# Patient Record
Sex: Male | Born: 1945
Health system: Southern US, Community
[De-identification: ages and names within clinical notes are randomized; demographics above are authoritative.]

## PROBLEM LIST (undated history)

## (undated) DIAGNOSIS — J302 Other seasonal allergic rhinitis: Secondary | ICD-10-CM

## (undated) DIAGNOSIS — L409 Psoriasis, unspecified: Secondary | ICD-10-CM

## (undated) DIAGNOSIS — H8109 Meniere's disease, unspecified ear: Secondary | ICD-10-CM

## (undated) DIAGNOSIS — I10 Essential (primary) hypertension: Secondary | ICD-10-CM

## (undated) DIAGNOSIS — I7789 Other specified disorders of arteries and arterioles: Secondary | ICD-10-CM

## (undated) DIAGNOSIS — Z9889 Other specified postprocedural states: Secondary | ICD-10-CM

## (undated) DIAGNOSIS — N529 Male erectile dysfunction, unspecified: Secondary | ICD-10-CM

## (undated) DIAGNOSIS — H9190 Unspecified hearing loss, unspecified ear: Secondary | ICD-10-CM

## (undated) DIAGNOSIS — I471 Supraventricular tachycardia, unspecified: Secondary | ICD-10-CM

## (undated) DIAGNOSIS — Z9089 Acquired absence of other organs: Secondary | ICD-10-CM

## (undated) DIAGNOSIS — E1165 Type 2 diabetes mellitus with hyperglycemia: Secondary | ICD-10-CM

## (undated) DIAGNOSIS — T7840XA Allergy, unspecified, initial encounter: Secondary | ICD-10-CM

## (undated) DIAGNOSIS — L309 Dermatitis, unspecified: Secondary | ICD-10-CM

## (undated) DIAGNOSIS — Z8719 Personal history of other diseases of the digestive system: Secondary | ICD-10-CM

## (undated) DIAGNOSIS — H269 Unspecified cataract: Secondary | ICD-10-CM

## (undated) DIAGNOSIS — N200 Calculus of kidney: Secondary | ICD-10-CM

## (undated) DIAGNOSIS — Z9049 Acquired absence of other specified parts of digestive tract: Secondary | ICD-10-CM

## (undated) DIAGNOSIS — N400 Enlarged prostate without lower urinary tract symptoms: Secondary | ICD-10-CM

## (undated) HISTORY — PX: OTHER SURGICAL HISTORY: SHX169

## (undated) HISTORY — PX: CHOLECYSTECTOMY: SHX55

## (undated) HISTORY — PX: HERNIA REPAIR: SHX51

## (undated) HISTORY — DX: Calculus of kidney: N20.0

## (undated) HISTORY — PX: TONSILLECTOMY: SHX5217

## (undated) HISTORY — DX: Other seasonal allergic rhinitis: J30.2

## (undated) HISTORY — DX: Male erectile dysfunction, unspecified: N52.9

## (undated) HISTORY — DX: Acquired absence of other specified parts of digestive tract: Z90.49

## (undated) HISTORY — PX: COLONOSCOPY: SHX174

## (undated) HISTORY — DX: Essential (primary) hypertension: I10

## (undated) HISTORY — DX: Allergy, unspecified, initial encounter: T78.40XA

## (undated) HISTORY — DX: Dermatitis, unspecified: L30.9

## (undated) HISTORY — DX: Psoriasis, unspecified: L40.9

## (undated) HISTORY — PX: EYE SURGERY: SHX253

## (undated) HISTORY — DX: Acquired absence of other organs: Z90.89

## (undated) HISTORY — DX: Personal history of other diseases of the digestive system: Z87.19

## (undated) HISTORY — PX: APPENDECTOMY: SHX54

## (undated) HISTORY — DX: Supraventricular tachycardia, unspecified: I47.10

## (undated) HISTORY — DX: Unspecified hearing loss, unspecified ear: H91.90

## (undated) HISTORY — DX: Type 2 diabetes mellitus with hyperglycemia: E11.65

## (undated) HISTORY — DX: Unspecified cataract: H26.9

## (undated) HISTORY — DX: Meniere's disease, unspecified ear: H81.09

## (undated) HISTORY — DX: Other specified postprocedural states: Z98.890

## (undated) HISTORY — DX: Supraventricular tachycardia: I47.1

## (undated) HISTORY — DX: Other specified disorders of arteries and arterioles: I77.89

---

## 2005-06-25 ENCOUNTER — Emergency Department (HOSPITAL_COMMUNITY): Admission: EM | Admit: 2005-06-25 | Discharge: 2005-06-25 | Payer: Self-pay | Admitting: Emergency Medicine

## 2006-10-08 ENCOUNTER — Ambulatory Visit: Payer: Self-pay | Admitting: Family Medicine

## 2006-10-09 LAB — CONVERTED CEMR LAB
ALT: 33 units/L (ref 0–40)
AST: 79 units/L — ABNORMAL HIGH (ref 0–37)
Albumin: 4.3 g/dL (ref 3.5–5.2)
Alkaline Phosphatase: 52 units/L (ref 39–117)
BUN: 23 mg/dL (ref 6–23)
Basophils Absolute: 0.1 10*3/uL (ref 0.0–0.1)
Basophils Relative: 1.3 % — ABNORMAL HIGH (ref 0.0–1.0)
Bilirubin, Direct: 0.1 mg/dL (ref 0.0–0.3)
CO2: 26 meq/L (ref 19–32)
Calcium: 9 mg/dL (ref 8.4–10.5)
Chloride: 103 meq/L (ref 96–112)
Cholesterol: 172 mg/dL (ref 0–200)
Creatinine, Ser: 1.2 mg/dL (ref 0.4–1.5)
Eosinophils Absolute: 0.3 10*3/uL (ref 0.0–0.6)
Eosinophils Relative: 3.8 % (ref 0.0–5.0)
Free T4: 0.8 ng/dL (ref 0.6–1.6)
GFR calc Af Amer: 79 mL/min
GFR calc non Af Amer: 66 mL/min
Glucose, Bld: 77 mg/dL (ref 70–99)
HCT: 44.5 % (ref 39.0–52.0)
HDL: 34.1 mg/dL — ABNORMAL LOW (ref >39.0)
Hemoglobin: 15.7 g/dL (ref 13.0–17.0)
LDL Cholesterol: 111 mg/dL — ABNORMAL HIGH (ref 0–99)
Lymphocytes Relative: 22.1 % (ref 12.0–46.0)
MCHC: 35.4 g/dL (ref 30.0–36.0)
MCV: 92.5 fL (ref 78.0–100.0)
Monocytes Absolute: 1 10*3/uL — ABNORMAL HIGH (ref 0.2–0.7)
Monocytes Relative: 12.8 % — ABNORMAL HIGH (ref 3.0–11.0)
Neutro Abs: 4.4 10*3/uL (ref 1.4–7.7)
Neutrophils Relative %: 60 % (ref 43.0–77.0)
PSA: 1.83 ng/mL (ref 0.10–4.00)
Platelets: 240 10*3/uL (ref 150–400)
Potassium: 4 meq/L (ref 3.5–5.1)
RBC: 4.81 M/uL (ref 4.22–5.81)
RDW: 11.9 % (ref 11.5–14.6)
Sodium: 135 meq/L (ref 135–145)
T3, Free: 3 pg/mL (ref 2.3–4.2)
TSH: 1.5 microintl units/mL (ref 0.35–5.50)
Total Bilirubin: 1.4 mg/dL — ABNORMAL HIGH (ref 0.3–1.2)
Total CHOL/HDL Ratio: 5
Total Protein: 6.9 g/dL (ref 6.0–8.3)
Triglycerides: 135 mg/dL (ref 0–149)
VLDL: 27 mg/dL (ref 0–40)
WBC: 7.5 10*3/uL (ref 4.5–10.5)

## 2006-12-28 ENCOUNTER — Ambulatory Visit: Payer: Self-pay | Admitting: Family Medicine

## 2007-12-04 ENCOUNTER — Ambulatory Visit: Payer: Self-pay | Admitting: Family Medicine

## 2007-12-04 LAB — CONVERTED CEMR LAB
ALT: 30 units/L (ref 0–53)
AST: 60 units/L — ABNORMAL HIGH (ref 0–37)
Albumin: 4.2 g/dL (ref 3.5–5.2)
Alkaline Phosphatase: 60 units/L (ref 39–117)
BUN: 21 mg/dL (ref 6–23)
Basophils Absolute: 0 10*3/uL (ref 0.0–0.1)
Basophils Relative: 0.4 % (ref 0.0–1.0)
Bilirubin Urine: NEGATIVE
Bilirubin, Direct: 0.1 mg/dL (ref 0.0–0.3)
Blood in Urine, dipstick: NEGATIVE
CO2: 30 meq/L (ref 19–32)
Calcium: 9.6 mg/dL (ref 8.4–10.5)
Chloride: 110 meq/L (ref 96–112)
Cholesterol: 172 mg/dL (ref 0–200)
Creatinine, Ser: 1 mg/dL (ref 0.4–1.5)
Eosinophils Absolute: 0.1 10*3/uL (ref 0.0–0.7)
Eosinophils Relative: 2.5 % (ref 0.0–5.0)
GFR calc Af Amer: 98 mL/min
GFR calc non Af Amer: 81 mL/min
Glucose, Bld: 126 mg/dL — ABNORMAL HIGH (ref 70–99)
Glucose, Urine, Semiquant: NEGATIVE
HCT: 43.8 % (ref 39.0–52.0)
HDL: 31.6 mg/dL — ABNORMAL LOW (ref >39.0)
Hemoglobin: 15.4 g/dL (ref 13.0–17.0)
Ketones, urine, test strip: NEGATIVE
LDL Cholesterol: 123 mg/dL — ABNORMAL HIGH (ref 0–99)
Lymphocytes Relative: 23.1 % (ref 12.0–46.0)
MCHC: 35.1 g/dL (ref 30.0–36.0)
MCV: 94.5 fL (ref 78.0–100.0)
Monocytes Absolute: 0.6 10*3/uL (ref 0.1–1.0)
Monocytes Relative: 9.7 % (ref 3.0–12.0)
Neutro Abs: 3.7 10*3/uL (ref 1.4–7.7)
Neutrophils Relative %: 64.3 % (ref 43.0–77.0)
Nitrite: NEGATIVE
PSA: 2.22 ng/mL (ref 0.10–4.00)
Platelets: 201 10*3/uL (ref 150–400)
Potassium: 4.9 meq/L (ref 3.5–5.1)
Protein, U semiquant: NEGATIVE
RBC: 4.64 M/uL (ref 4.22–5.81)
RDW: 12.2 % (ref 11.5–14.6)
Sodium: 142 meq/L (ref 135–145)
Specific Gravity, Urine: 1.02
TSH: 0.86 microintl units/mL (ref 0.35–5.50)
Total Bilirubin: 1.1 mg/dL (ref 0.3–1.2)
Total CHOL/HDL Ratio: 5.4
Total Protein: 6.8 g/dL (ref 6.0–8.3)
Triglycerides: 88 mg/dL (ref 0–149)
Urobilinogen, UA: 0.2
VLDL: 18 mg/dL (ref 0–40)
WBC Urine, dipstick: NEGATIVE
WBC: 5.7 10*3/uL (ref 4.5–10.5)
pH: 5.5

## 2007-12-13 ENCOUNTER — Telehealth: Payer: Self-pay | Admitting: *Deleted

## 2007-12-13 ENCOUNTER — Ambulatory Visit: Payer: Self-pay | Admitting: Family Medicine

## 2007-12-13 DIAGNOSIS — H906 Mixed conductive and sensorineural hearing loss, bilateral: Secondary | ICD-10-CM

## 2007-12-13 DIAGNOSIS — E663 Overweight: Secondary | ICD-10-CM

## 2007-12-13 DIAGNOSIS — L258 Unspecified contact dermatitis due to other agents: Secondary | ICD-10-CM

## 2007-12-13 DIAGNOSIS — L259 Unspecified contact dermatitis, unspecified cause: Secondary | ICD-10-CM | POA: Insufficient documentation

## 2007-12-13 DIAGNOSIS — L408 Other psoriasis: Secondary | ICD-10-CM

## 2007-12-13 DIAGNOSIS — I10 Essential (primary) hypertension: Secondary | ICD-10-CM | POA: Insufficient documentation

## 2007-12-13 HISTORY — DX: Unspecified contact dermatitis, unspecified cause: L25.9

## 2007-12-13 HISTORY — DX: Other psoriasis: L40.8

## 2008-03-06 ENCOUNTER — Ambulatory Visit: Payer: Self-pay | Admitting: Family Medicine

## 2008-03-06 LAB — CONVERTED CEMR LAB
BUN: 16 mg/dL (ref 6–23)
CO2: 26 meq/L (ref 19–32)
Calcium: 8.8 mg/dL (ref 8.4–10.5)
Chloride: 106 meq/L (ref 96–112)
Cholesterol: 166 mg/dL (ref 0–200)
Creatinine, Ser: 1 mg/dL (ref 0.4–1.5)
GFR calc Af Amer: 97 mL/min
GFR calc non Af Amer: 80 mL/min
Glucose, Bld: 108 mg/dL — ABNORMAL HIGH (ref 70–99)
HDL: 31.2 mg/dL — ABNORMAL LOW (ref >39.0)
Hgb A1c MFr Bld: 5.7 % (ref 4.6–6.0)
LDL Cholesterol: 117 mg/dL — ABNORMAL HIGH (ref 0–99)
Potassium: 4.1 meq/L (ref 3.5–5.1)
Sodium: 137 meq/L (ref 135–145)
Total CHOL/HDL Ratio: 5.3
Triglycerides: 87 mg/dL (ref 0–149)
VLDL: 17 mg/dL (ref 0–40)

## 2008-03-11 ENCOUNTER — Ambulatory Visit: Payer: Self-pay | Admitting: Family Medicine

## 2008-03-30 DIAGNOSIS — M545 Low back pain, unspecified: Secondary | ICD-10-CM | POA: Insufficient documentation

## 2008-03-31 ENCOUNTER — Ambulatory Visit: Payer: Self-pay | Admitting: Family Medicine

## 2008-12-08 ENCOUNTER — Ambulatory Visit: Payer: Self-pay | Admitting: Family Medicine

## 2008-12-08 LAB — CONVERTED CEMR LAB
ALT: 23 units/L (ref 0–53)
AST: 55 units/L — ABNORMAL HIGH (ref 0–37)
Albumin: 4.1 g/dL (ref 3.5–5.2)
Alkaline Phosphatase: 59 units/L (ref 39–117)
BUN: 18 mg/dL (ref 6–23)
Basophils Absolute: 0 10*3/uL (ref 0.0–0.1)
Basophils Relative: 0.5 % (ref 0.0–3.0)
Bilirubin Urine: NEGATIVE
Bilirubin, Direct: 0.1 mg/dL (ref 0.0–0.3)
Blood in Urine, dipstick: NEGATIVE
CO2: 29 meq/L (ref 19–32)
Calcium: 9.2 mg/dL (ref 8.4–10.5)
Chloride: 107 meq/L (ref 96–112)
Cholesterol: 174 mg/dL (ref 0–200)
Creatinine, Ser: 1 mg/dL (ref 0.4–1.5)
Eosinophils Absolute: 0.3 10*3/uL (ref 0.0–0.7)
Eosinophils Relative: 4.8 % (ref 0.0–5.0)
GFR calc non Af Amer: 80.22 mL/min (ref >60)
Glucose, Bld: 117 mg/dL — ABNORMAL HIGH (ref 70–99)
Glucose, Urine, Semiquant: NEGATIVE
HCT: 45 % (ref 39.0–52.0)
HDL: 33 mg/dL — ABNORMAL LOW (ref >39.00)
Hemoglobin: 15.8 g/dL (ref 13.0–17.0)
Ketones, urine, test strip: NEGATIVE
LDL Cholesterol: 119 mg/dL — ABNORMAL HIGH (ref 0–99)
Lymphocytes Relative: 22.6 % (ref 12.0–46.0)
Lymphs Abs: 1.3 10*3/uL (ref 0.7–4.0)
MCHC: 35.2 g/dL (ref 30.0–36.0)
MCV: 95.1 fL (ref 78.0–100.0)
Monocytes Absolute: 0.5 10*3/uL (ref 0.1–1.0)
Monocytes Relative: 9.1 % (ref 3.0–12.0)
Neutro Abs: 3.8 10*3/uL (ref 1.4–7.7)
Neutrophils Relative %: 63 % (ref 43.0–77.0)
Nitrite: NEGATIVE
PSA: 2.17 ng/mL (ref 0.10–4.00)
Platelets: 223 10*3/uL (ref 150.0–400.0)
Potassium: 4.6 meq/L (ref 3.5–5.1)
Protein, U semiquant: NEGATIVE
RBC: 4.73 M/uL (ref 4.22–5.81)
RDW: 12.2 % (ref 11.5–14.6)
Sodium: 139 meq/L (ref 135–145)
Specific Gravity, Urine: 1.015
TSH: 0.93 microintl units/mL (ref 0.35–5.50)
Total Bilirubin: 1 mg/dL (ref 0.3–1.2)
Total CHOL/HDL Ratio: 5
Total Protein: 6.7 g/dL (ref 6.0–8.3)
Triglycerides: 112 mg/dL (ref 0.0–149.0)
Urobilinogen, UA: 0.2
VLDL: 22.4 mg/dL (ref 0.0–40.0)
WBC Urine, dipstick: NEGATIVE
WBC: 5.9 10*3/uL (ref 4.5–10.5)
pH: 5.5

## 2008-12-25 ENCOUNTER — Ambulatory Visit: Payer: Self-pay | Admitting: Family Medicine

## 2008-12-28 LAB — CONVERTED CEMR LAB: Hgb A1c MFr Bld: 5.3 % (ref 4.6–6.1)

## 2009-01-29 ENCOUNTER — Ambulatory Visit: Payer: Self-pay | Admitting: Family Medicine

## 2009-01-29 DIAGNOSIS — S61409A Unspecified open wound of unspecified hand, initial encounter: Secondary | ICD-10-CM | POA: Insufficient documentation

## 2009-02-08 ENCOUNTER — Ambulatory Visit: Payer: Self-pay | Admitting: Family Medicine

## 2009-12-24 ENCOUNTER — Ambulatory Visit: Payer: Self-pay | Admitting: Family Medicine

## 2009-12-24 LAB — CONVERTED CEMR LAB
ALT: 28 units/L (ref 0–53)
AST: 58 units/L — ABNORMAL HIGH (ref 0–37)
Albumin: 4.1 g/dL (ref 3.5–5.2)
Alkaline Phosphatase: 61 units/L (ref 39–117)
BUN: 19 mg/dL (ref 6–23)
Basophils Absolute: 0 10*3/uL (ref 0.0–0.1)
Basophils Relative: 0.7 % (ref 0.0–3.0)
Bilirubin Urine: NEGATIVE
Bilirubin, Direct: 0.2 mg/dL (ref 0.0–0.3)
CO2: 30 meq/L (ref 19–32)
Calcium: 9 mg/dL (ref 8.4–10.5)
Chloride: 104 meq/L (ref 96–112)
Cholesterol: 162 mg/dL (ref 0–200)
Creatinine, Ser: 0.9 mg/dL (ref 0.4–1.5)
Eosinophils Absolute: 0.4 10*3/uL (ref 0.0–0.7)
Eosinophils Relative: 8.9 % — ABNORMAL HIGH (ref 0.0–5.0)
GFR calc non Af Amer: 86.93 mL/min (ref >60)
Glucose, Bld: 119 mg/dL — ABNORMAL HIGH (ref 70–99)
HCT: 42.2 % (ref 39.0–52.0)
HDL: 32.5 mg/dL — ABNORMAL LOW (ref >39.00)
Hemoglobin, Urine: NEGATIVE
Hemoglobin: 14.9 g/dL (ref 13.0–17.0)
Ketones, ur: NEGATIVE mg/dL
LDL Cholesterol: 118 mg/dL — ABNORMAL HIGH (ref 0–99)
Leukocytes, UA: NEGATIVE
Lymphocytes Relative: 27.1 % (ref 12.0–46.0)
Lymphs Abs: 1.2 10*3/uL (ref 0.7–4.0)
MCHC: 35.2 g/dL (ref 30.0–36.0)
MCV: 93.4 fL (ref 78.0–100.0)
Monocytes Absolute: 0.4 10*3/uL (ref 0.1–1.0)
Monocytes Relative: 9.9 % (ref 3.0–12.0)
Neutro Abs: 2.3 10*3/uL (ref 1.4–7.7)
Neutrophils Relative %: 53.4 % (ref 43.0–77.0)
Nitrite: NEGATIVE
PSA: 1.84 ng/mL (ref 0.10–4.00)
Platelets: 204 10*3/uL (ref 150.0–400.0)
Potassium: 4.7 meq/L (ref 3.5–5.1)
RBC: 4.52 M/uL (ref 4.22–5.81)
RDW: 12.8 % (ref 11.5–14.6)
Sodium: 140 meq/L (ref 135–145)
Specific Gravity, Urine: >=1.03 (ref 1.000–1.030)
TSH: 0.76 microintl units/mL (ref 0.35–5.50)
Total Bilirubin: 0.9 mg/dL (ref 0.3–1.2)
Total CHOL/HDL Ratio: 5
Total Protein, Urine: NEGATIVE mg/dL
Total Protein: 6.5 g/dL (ref 6.0–8.3)
Triglycerides: 57 mg/dL (ref 0.0–149.0)
Urine Glucose: NEGATIVE mg/dL
Urobilinogen, UA: 0.2 (ref 0.0–1.0)
VLDL: 11.4 mg/dL (ref 0.0–40.0)
WBC: 4.4 10*3/uL — ABNORMAL LOW (ref 4.5–10.5)
pH: 6 (ref 5.0–8.0)

## 2009-12-31 ENCOUNTER — Ambulatory Visit: Payer: Self-pay | Admitting: Family Medicine

## 2010-01-28 ENCOUNTER — Ambulatory Visit: Payer: Self-pay | Admitting: Family Medicine

## 2010-02-01 ENCOUNTER — Telehealth: Payer: Self-pay | Admitting: Family Medicine

## 2010-02-01 DIAGNOSIS — L570 Actinic keratosis: Secondary | ICD-10-CM

## 2010-08-01 ENCOUNTER — Ambulatory Visit
Admission: RE | Admit: 2010-08-01 | Discharge: 2010-08-01 | Payer: Self-pay | Source: Home / Self Care | Attending: Family Medicine | Admitting: Family Medicine

## 2010-08-01 DIAGNOSIS — N410 Acute prostatitis: Secondary | ICD-10-CM | POA: Insufficient documentation

## 2010-08-01 DIAGNOSIS — J309 Allergic rhinitis, unspecified: Secondary | ICD-10-CM

## 2010-08-01 DIAGNOSIS — M549 Dorsalgia, unspecified: Secondary | ICD-10-CM | POA: Insufficient documentation

## 2010-08-01 HISTORY — DX: Allergic rhinitis, unspecified: J30.9

## 2010-08-01 LAB — CONVERTED CEMR LAB
Bilirubin Urine: NEGATIVE
Blood in Urine, dipstick: NEGATIVE
Glucose, Urine, Semiquant: NEGATIVE
Ketones, urine, test strip: NEGATIVE
Nitrite: NEGATIVE
Protein, U semiquant: NEGATIVE
Specific Gravity, Urine: 1.015
Urobilinogen, UA: 0.2
WBC Urine, dipstick: NEGATIVE
pH: 5

## 2010-08-02 ENCOUNTER — Encounter (INDEPENDENT_AMBULATORY_CARE_PROVIDER_SITE_OTHER): Payer: Self-pay | Admitting: *Deleted

## 2010-08-02 ENCOUNTER — Telehealth: Payer: Self-pay | Admitting: Family Medicine

## 2010-08-02 ENCOUNTER — Emergency Department (HOSPITAL_COMMUNITY)
Admission: EM | Admit: 2010-08-02 | Discharge: 2010-08-02 | Payer: Self-pay | Source: Home / Self Care | Admitting: Emergency Medicine

## 2010-08-03 ENCOUNTER — Encounter (INDEPENDENT_AMBULATORY_CARE_PROVIDER_SITE_OTHER): Payer: Self-pay | Admitting: *Deleted

## 2010-08-04 ENCOUNTER — Ambulatory Visit
Admission: RE | Admit: 2010-08-04 | Discharge: 2010-08-04 | Payer: Self-pay | Source: Home / Self Care | Attending: Family Medicine | Admitting: Family Medicine

## 2010-08-04 DIAGNOSIS — R1310 Dysphagia, unspecified: Secondary | ICD-10-CM

## 2010-08-04 HISTORY — DX: Dysphagia, unspecified: R13.10

## 2010-08-05 ENCOUNTER — Encounter
Admission: RE | Admit: 2010-08-05 | Discharge: 2010-08-05 | Payer: Self-pay | Source: Home / Self Care | Attending: Family Medicine | Admitting: Family Medicine

## 2010-08-08 LAB — BASIC METABOLIC PANEL
BUN: 13 mg/dL (ref 6–23)
CO2: 25 mEq/L (ref 19–32)
Calcium: 8.9 mg/dL (ref 8.4–10.5)
Chloride: 99 mEq/L (ref 96–112)
Creatinine, Ser: 1.09 mg/dL (ref 0.4–1.5)
GFR calc Af Amer: >60 mL/min (ref >60)
GFR calc non Af Amer: >60 mL/min (ref >60)
Glucose, Bld: 113 mg/dL — ABNORMAL HIGH (ref 70–99)
Potassium: 3.8 mEq/L (ref 3.5–5.1)
Sodium: 133 mEq/L — ABNORMAL LOW (ref 135–145)

## 2010-08-08 LAB — DIFFERENTIAL
Basophils Absolute: 0 10*3/uL (ref 0.0–0.1)
Basophils Relative: 0 % (ref 0–1)
Eosinophils Absolute: 0.2 10*3/uL (ref 0.0–0.7)
Eosinophils Relative: 4 % (ref 0–5)
Lymphocytes Relative: 18 % (ref 12–46)
Lymphs Abs: 1 10*3/uL (ref 0.7–4.0)
Monocytes Absolute: 0.8 10*3/uL (ref 0.1–1.0)
Monocytes Relative: 13 % — ABNORMAL HIGH (ref 3–12)
Neutro Abs: 3.7 10*3/uL (ref 1.7–7.7)
Neutrophils Relative %: 65 % (ref 43–77)

## 2010-08-08 LAB — POCT CARDIAC MARKERS
CKMB, poc: 1.5 ng/mL (ref 1.0–8.0)
CKMB, poc: <1 ng/mL — ABNORMAL LOW (ref 1.0–8.0)
Myoglobin, poc: 85.3 ng/mL (ref 12–200)
Myoglobin, poc: 63.6 ng/mL (ref 12–200)
Troponin i, poc: <0.05 ng/mL (ref 0.00–0.09)
Troponin i, poc: <0.05 ng/mL (ref 0.00–0.09)

## 2010-08-08 LAB — CBC
HCT: 38.9 % — ABNORMAL LOW (ref 39.0–52.0)
Hemoglobin: 14.4 g/dL (ref 13.0–17.0)
MCH: 32.7 pg (ref 26.0–34.0)
MCHC: 37 g/dL — ABNORMAL HIGH (ref 30.0–36.0)
MCV: 88.4 fL (ref 78.0–100.0)
Platelets: 147 10*3/uL — ABNORMAL LOW (ref 150–400)
RBC: 4.4 MIL/uL (ref 4.22–5.81)
RDW: 12.1 % (ref 11.5–15.5)
WBC: 5.7 10*3/uL (ref 4.0–10.5)

## 2010-08-08 LAB — D-DIMER, QUANTITATIVE: D-Dimer, Quant: 0.54 ug/mL-FEU — ABNORMAL HIGH (ref 0.00–0.48)

## 2010-08-23 NOTE — Progress Notes (Signed)
  Phone Note Outgoing Call   Summary of Call: I called Daniel Guerrero to review his path report, most likely an irritated A. K. see me in 8 weeks for re-examination.  Also advised if any new redness, etc., to call immediately for a wide excision Initial call taken by: Roderick Pee MD,  February 01, 2010 8:35 AM

## 2010-08-23 NOTE — Assessment & Plan Note (Signed)
Summary: lesion removal/njr   Procedure Note Last Tetanus: Tdap (12/13/2007)  Mole Biopsy/Removal: Indication: changing lesion Consent signed: yes  Procedure # 1: elliptical incision with 2 mm margin    Size (in cm): 1.0 x 1.0    Region: dorsal    Location: arm-upper-left    Instrument used: #15 blade    Anesthesia: 1% lidocaine w/epinephrine    Closure: caut  Cleaned and prepped with: alcohol Wound dressing: neosporin and bandaid   History of Present Illness: Daniel Guerrero is a 65 year old male, who comes in today for removal of a lesion on his left forearm.  He has had a lesion there for many years, recently, it's grown and changed colors  Allergies: 1)  ! Penicillin 2)  ! * Peanuts   Complete Medication List: 1)  Triamcinolone Acetonide 0.1 % Oint (Triamcinolone acetonide) .... Apply thin layer twice a day 2)  Clobetasol Propionate 0.05 % Oint (Clobetasol propionate) .... Apply once daily 3)  Lisinopril-hydrochlorothiazide 20-12.5 Mg Tabs (Lisinopril-hydrochlorothiazide) .... Once daily 4)  Neomycin-polymyxin B Gu 40-200000 Soln (Neomycin-polymyxin b gu) .... Apply to ears only 5)  Viagra 100 Mg Tabs (Sildenafil citrate) .... Uad 6)  Flexeril 10 Mg Tabs (Cyclobenzaprine hcl) .... Take 1 tablet by mouth three times a day 7)  Desonide 0.05 % Crea (Desonide) .... Twice a month for skin rash  Other Orders: Shave Skin Lesion 1.1-2.0 cm/trunk/arm/leg ([address])

## 2010-08-23 NOTE — Assessment & Plan Note (Signed)
Summary: CPX/CJR   Vital Signs:  Patient profile:   65 year old male Height:      69.75 inches Weight:      226 pounds BMI:     32.78 Temp:     97.9 degrees F oral BP sitting:   130 / 60  (left arm)  Vitals Entered By: Kathrynn Speed CMA (December 31, 2009 2:42 PM) CC: CPX w labs   CC:  CPX w labs.  History of Present Illness: Daniel Guerrero is a 65 year old, married male, nonsmoker, who comes in today for evaluation of hypertension eczema erectile dysfunction.  Obesity.  His hypertension is treated with lisinopril -- H. CTZ 2012.5 daily.  BP 130/60.  His eczema is treated with a combination of triamcinolone ointment and clobetasol ointment.  It typically use one or the other not both.  Erectile dysfunction.  He uses  V  p.r.n.  History GI care.  Dental care.  Colonoscopy normal in GI.  Tetanus 2009 seasonal flu 2010 Pneumovax 2009.  He continues to struggle with his weight currently to 226  Allergies: 1)  ! Penicillin 2)  ! * Peanuts  Past History:  Past medical, surgical, family and social histories (including risk factors) reviewed, and no changes noted (except as noted below).  Past Medical History: Reviewed history from 12/13/2007 and no changes required. Diabetes mellitus, type II Hypertension eczema psoriasis appendectomy right left hernia repair tonsillectomy bilateral cataracts high-frequency hearing loss erectile dysfunction overweight  Family History: Reviewed history from 12/13/2007 and no changes required. father died in his 65s, had a triple a smoker, diabetic mother died at 37 of lung cancer , also, history of thyroid diseaseone brother one sister in good health  Social History: Reviewed history from 12/13/2007 and no changes required. Occupation: Married Never Smoked Alcohol use-no Drug use-no Regular exercise-no  Review of Systems      See HPI  Physical Exam  General:  Well-developed,well-nourished,in no acute distress; alert,appropriate and  cooperative throughout examination Head:  Normocephalic and atraumatic without obvious abnormalities. No apparent alopecia or balding. Eyes:  No corneal or conjunctival inflammation noted. EOMI. Perrla. Funduscopic exam benign, without hemorrhages, exudates or papilledema. Vision grossly normal. Ears:  External ear exam shows no significant lesions or deformities.  Otoscopic examination reveals clear canals, tympanic membranes are intact bilaterally without bulging, retraction, inflammation or discharge. Hearing is grossly normal bilaterally. Nose:  External nasal examination shows no deformity or inflammation. Nasal mucosa are pink and moist without lesions or exudates. Mouth:  Oral mucosa and oropharynx without lesions or exudates.  Teeth in good repair. Neck:  No deformities, masses, or tenderness noted. Chest Wall:  No deformities, masses, tenderness or gynecomastia noted. Breasts:  No masses or gynecomastia noted Lungs:  Normal respiratory effort, chest expands symmetrically. Lungs are clear to auscultation, no crackles or wheezes. Heart:  Normal rate and regular rhythm. S1 and S2 normal without gallop, murmur, click, rub or other extra sounds. Abdomen:  Bowel sounds positive,abdomen soft and non-tender without masses, organomegaly or hernias noted. Rectal:  No external abnormalities noted. Normal sphincter tone. No rectal masses or tenderness. Genitalia:  Testes bilaterally descended without nodularity, tenderness or masses. No scrotal masses or lesions. No penis lesions or urethral discharge. Prostate:  Prostate gland firm and smooth, no enlargement, nodularity, tenderness, mass, asymmetry or induration. Msk:  No deformity or scoliosis noted of thoracic or lumbar spine.   Pulses:  R and L carotid,radial,femoral,dorsalis pedis and posterior tibial pulses are full and equal bilaterally Extremities:  No clubbing, cyanosis, edema, or deformity noted with normal full range of motion of all joints.    Neurologic:  No cranial nerve deficits noted. Station and gait are normal. Plantar reflexes are down-going bilaterally. DTRs are symmetrical throughout. Sensory, motor and coordinative functions appear intact. Skin:  Intact without suspicious lesions or rashes Cervical Nodes:  No lymphadenopathy noted Axillary Nodes:  No palpable lymphadenopathy Inguinal Nodes:  No significant adenopathy Psych:  Cognition and judgment appear intact. Alert and cooperative with normal attention span and concentration. No apparent delusions, illusions, hallucinations   Impression & Recommendations:  Problem # 1:  Preventive Health Care (ICD-V70.0) Assessment Unchanged  Problem # 2:  CONTACT DERMATITIS&OTH ECZEMA DUE OTH SPEC AGENT (ICD-692.89) Assessment: Improved  His updated medication list for this problem includes:    Triamcinolone Acetonide 0.1 % Oint (Triamcinolone acetonide) .Marland Kitchen... Apply thin layer twice a day    Clobetasol Propionate 0.05 % Oint (Clobetasol propionate) .Marland Kitchen... Apply once daily    Desonide 0.05 % Crea (Desonide) .Marland Kitchen... Twice a month for skin rash  Orders: Prescription Created Electronically [phone])  Problem # 3:  OVERWEIGHT (ICD-278.02) Assessment: Unchanged  Orders: Prescription Created Electronically [phone])  Problem # 4:  HYPERTENSION (ICD-401.9) Assessment: Improved  His updated medication list for this problem includes:    Lisinopril-hydrochlorothiazide 20-12.5 Mg Tabs (Lisinopril-hydrochlorothiazide) ..... Once daily  Orders: Prescription Created Electronically [phone]) EKG w/ Interpretation ([address])  Problem # 5:  DIABETES MELLITUS, TYPE II (ICD-250.00) Assessment: Unchanged  His updated medication list for this problem includes:    Lisinopril-hydrochlorothiazide 20-12.5 Mg Tabs (Lisinopril-hydrochlorothiazide) ..... Once daily  Orders: Prescription Created Electronically [phone])  Complete Medication List: 1)  Triamcinolone Acetonide 0.1 % Oint (Triamcinolone  acetonide) .... Apply thin layer twice a day 2)  Clobetasol Propionate 0.05 % Oint (Clobetasol propionate) .... Apply once daily 3)  Lisinopril-hydrochlorothiazide 20-12.5 Mg Tabs (Lisinopril-hydrochlorothiazide) .... Once daily 4)  Neomycin-polymyxin B Gu 40-200000 Soln (Neomycin-polymyxin b gu) .... Apply to ears only 5)  Viagra 100 Mg Tabs (Sildenafil citrate) .... Uad 6)  Flexeril 10 Mg Tabs (Cyclobenzaprine hcl) .... Take 1 tablet by mouth three times a day 7)  Desonide 0.05 % Crea (Desonide) .... Twice a month for skin rash  Patient Instructions: 1)   walk 30 minutes daily over and above your work and decrease her caloric intake to 2000 calories per day, avoid carbs with the goal of getting y  weight down to 216 in the next 12 months 2)  Please schedule a follow-up appointment in 1 year. Prescriptions: VIAGRA 100 MG  TABS (SILDENAFIL CITRATE) UAD  #6 x 11   Entered and Authorized by:   Roderick Pee MD   Signed by:   Roderick Pee MD on 12/31/2009   Method used:   Electronically to        [facility]  [address] [address] [id number] (retail[facility]       [address]       [place], [place]  [address]       Ph: [phone] or [phone]       Fax: [phone]   RxID:   [id number] LISINOPRIL-HYDROCHLOROTHIAZIDE 20-12.5 MG  TABS (LISINOPRIL-HYDROCHLOROTHIAZIDE) once daily  #100 x 4   Entered and Authorized by:   Roderick Pee MD   Signed by:   Roderick Pee MD on 12/31/2009   Method used:   [facility]        [facility]  [address] [id number]       [address]       [place], [place]  [address]       Ph: [phone] or [phone]       Fax: [phone]   RxID:   [id number] CLOBETASOL PROPIONATE 0.05 %  OINT (CLOBETASOL PROPIONATE) apply once daily  #67ml x 6   Entered and Authorized by:   Roderick Pee MD   Signed by:   Roderick Pee MD on 12/31/2009   Method used:   [facility] to        [facility]  [address] [id number]        [address]       [place], [place]  [address]       Ph: [phone] or [phone]       Fax: [phone]   RxID:   [id number] TRIAMCINOLONE ACETONIDE 0.1 %  OINT (TRIAMCINOLONE ACETONIDE) apply thin layer twice a day  #240 gr x 6   Entered and Authorized by:   Roderick Pee MD   Signed by:   Roderick Pee MD on 12/31/2009   Method used:   [facility]        [facility]  [address] [id number]       [address]       [place], [place]  [address]       Ph: [phone] or [phone]       Fax: [phone]   RxID:   [id number]

## 2010-08-23 NOTE — Miscellaneous (Signed)
Summary: Consent for Mole Removal  Consent for Mole Removal   Imported By: Maryln Gottron 01/31/2010 15:12:45  _____________________________________________________________________  External Attachment:    Type:   Image     Comment:   External Document

## 2010-08-25 NOTE — Assessment & Plan Note (Signed)
Summary: 1 wk rov/njr/RSC PER DR TODD/CJR   Vital Signs:  Patient profile:   65 year old male Weight:      226 pounds Temp:     98.1 degrees F oral BP sitting:   120 / 70  (left arm) Cuff size:   large  Vitals Entered By: Romualdo Bolk, CMA (AAMA) (August 04, 2010 9:06 AM) CC: follow-up visit   CC:  follow-up visit.  History of Present Illness: Daniel Guerrero is a 65 year old, married male, nonsmoker, who comes in today following an emergency room evaluation of chest pain.  We saw him on January the ninth of for evaluation at that time.  He was diagnosed to have prostatitis start on Cipro and shingles involving the left thoracic area.  T8 to the posterior axillary line.  We also start him on Zovirax.  Later that night he woke up with acute pain with the emergency room and a complete diagnostic evaluation, which was negative.  CT scan of his chest was normal.  They picked up some ancillaist is recommending a follow-up ultrasound to document size and stability.  Is also having complaints now with dysphasia.  He states for the past two years.  He said difficulty swallowing, and sometimes food gets stuck in his upper esophagus.  He was taking a lot of Motrin, but he stopped that now.  He's also decreasing his caffeine consumption because he thinks this is causing headaches.  Preventive Screening-Counseling & Management  Alcohol-Tobacco     Smoking Status: never  Caffeine-Diet-Exercise     Does Patient Exercise: no  Current Medications (verified): 1)  Triamcinolone Acetonide 0.1 %  Oint (Triamcinolone Acetonide) .... Apply Thin Layer Twice A Day 2)  Clobetasol Propionate 0.05 %  Oint (Clobetasol Propionate) .... Apply Once Daily 3)  Lisinopril-Hydrochlorothiazide 20-12.5 Mg  Tabs (Lisinopril-Hydrochlorothiazide) .... Once Daily 4)  Neomycin-Polymyxin B Gu 40-200000  Soln (Neomycin-Polymyxin B Gu) .... Apply To Ears Only 5)  Viagra 100 Mg  Tabs (Sildenafil Citrate) .... Uad 6)   Flexeril 10 Mg Tabs (Cyclobenzaprine Hcl) .... Take 1 Tablet By Mouth Three Times A Day 7)  Desonide 0.05 % Crea (Desonide) .... Twice A Month For Skin Rash 8)  Zovirax 800 Mg Tabs (Acyclovir) .... Take 1 Tablet By Mouth Three Times A Day 9)  Ciprofloxacin Hcl 500 Mg Tabs (Ciprofloxacin Hcl) .... Take 1 Tablet By Mouth Two Times A Day  Allergies (verified): 1)  ! Penicillin 2)  ! * Peanuts  Past History:  Past medical, surgical, family and social histories (including risk factors) reviewed for relevance to current acute and chronic problems.  Past Medical History: Reviewed history from 12/13/2007 and no changes required. Diabetes mellitus, type II Hypertension eczema psoriasis appendectomy right left hernia repair tonsillectomy bilateral cataracts high-frequency hearing loss erectile dysfunction overweight  Family History: Reviewed history from 12/13/2007 and no changes required. father died in his 44s, had a triple a smoker, diabetic mother died at 50 of lung cancer , also, history of thyroid diseaseone brother one sister in good health  Social History: Reviewed history from 12/13/2007 and no changes required. Occupation: Married Never Smoked Alcohol use-no Drug use-no Regular exercise-no  Review of Systems      See HPI  Physical Exam  General:  Well-developed,well-nourished,in no acute distress; alert,appropriate and cooperative throughout examination Skin:  the rash is beginning to say he did   Problems:  Medical Problems Added: 1)  Dx of Dysphagia  (ICD-787.20)  Impression & Recommendations:  Problem #  1:  PROSTATITIS, ACUTE (ICD-601.0) Assessment Improved  Problem # 2:  HERPES ZOSTER NOS (ICD-053.9) Assessment: Improved  Problem # 3:  DYSPHAGIA (ICD-787.20) Assessment: New  Orders: Gastroenterology Referral (GI)  Complete Medication List: 1)  Triamcinolone Acetonide 0.1 % Oint (Triamcinolone acetonide) .... Apply thin layer twice a day 2)   Clobetasol Propionate 0.05 % Oint (Clobetasol propionate) .... Apply once daily 3)  Lisinopril-hydrochlorothiazide 20-12.5 Mg Tabs (Lisinopril-hydrochlorothiazide) .... Once daily 4)  Neomycin-polymyxin B Gu 40-200000 Soln (Neomycin-polymyxin b gu) .... Apply to ears only 5)  Viagra 100 Mg Tabs (Sildenafil citrate) .... Uad 6)  Flexeril 10 Mg Tabs (Cyclobenzaprine hcl) .... Take 1 tablet by mouth three times a day 7)  Desonide 0.05 % Crea (Desonide) .... Twice a month for skin rash 8)  Zovirax 800 Mg Tabs (Acyclovir) .... Take 1 tablet by mouth three times a day 9)  Ciprofloxacin Hcl 500 Mg Tabs (Ciprofloxacin hcl) .... Take 1 tablet by mouth two times a day  Other Orders: Radiology Referral (Radiology)  Patient Instructions: 1)  dietary wise, stay on a soft diet, and we will get you set up for a GI consult. 2)  Continue medications Cipro, and Zovirax as outlined. 3)  Because you're on lisinopril, the most, Motrin, I would take to be 400 mg twice daily with food. 4)  Continue to decrease your caffeine consumption.  This will help stop the migraine headaches.  If we don't see any improvement, then I would consider consult if the headache clinic   Orders Added: 1)  Est. Patient Level IV [[address]] 2)  Gastroenterology Referral [GI] 3)  Radiology Referral [Radiology]

## 2010-08-25 NOTE — Progress Notes (Signed)
Summary: Pt said to be sure to review CT on Liver, prior to ov on thurs  Phone Note Call from Patient Call back at Home Phone [phone]   Caller: Patient Action Taken: Provider Notified Summary of Call: Pt called and has sch an ov to see Dr Tawanna Cooler on Thurs 08/04/10 at 9:15. Pt said that the doctor at [facility], wanted to make sure that Dr Tawanna Cooler reviewed his CT Scan on pts liver. Doctor said that there was something abnormal.    Initial call taken by: Lucy Antigua,  August 02, 2010 11:20 AM

## 2010-08-25 NOTE — Assessment & Plan Note (Signed)
Summary: bladder inf//ccm   Vital Signs:  Patient profile:   65 year old male Weight:      226 pounds Temp:     98.0 degrees F oral BP sitting:   132 / 92  (left arm) Cuff size:   regular  Vitals Entered By: Kern Reap CMA Duncan Dull) (August 01, 2010 12:35 PM) CC: possible uti   CC:  possible uti.  History of Present Illness:  Daniel Guerrero is a 65 year old male, married, nonsmoker, who comes in today for evaluation of 3 problems.  About a month ago he developed some bifrontal headaches.  Usually in the morning.  He feels fine during the day.  His head will hurt in and by evening the headache goes away.  He's also had sneezing and congestion, postnasal drip, and cough.  No history of wheezing.  Five days ago, he developed some chills and low back pain.  A couple years ago.  He had this problem in and out in the emergency room with severe prostatitis.  He took one doxycycline tablet yesterday.  That was 65 years old.  He also has a rash on his back.  Is a vesicular rash that goes from the T2 level down into the posterior axillary level.  He states is very mild.  He had a shingles vaccine about for 5 years ago  Allergies: 1)  ! Penicillin 2)  ! * Peanuts  Past History:  Past medical, surgical, family and social histories (including risk factors) reviewed for relevance to current acute and chronic problems.  Past Medical History: Reviewed history from 12/13/2007 and no changes required. Diabetes mellitus, type II Hypertension eczema psoriasis appendectomy right left hernia repair tonsillectomy bilateral cataracts high-frequency hearing loss erectile dysfunction overweight  Family History: Reviewed history from 12/13/2007 and no changes required. father died in his 109s, had a triple a smoker, diabetic mother died at 93 of lung cancer , also, history of thyroid diseaseone brother one sister in good health  Social History: Reviewed history from 12/13/2007 and no changes  required. Occupation: Married Never Smoked Alcohol use-no Drug use-no Regular exercise-no  Review of Systems      See HPI  Physical Exam  General:  Well-developed,well-nourished,in no acute distress; alert,appropriate and cooperative throughout examination Head:  Normocephalic and atraumatic without obvious abnormalities. No apparent alopecia or balding. Eyes:  No corneal or conjunctival inflammation noted. EOMI. Perrla. Funduscopic exam benign, without hemorrhages, exudates or papilledema. Vision grossly normal. Ears:  External ear exam shows no significant lesions or deformities.  Otoscopic examination reveals clear canals, tympanic membranes are intact bilaterally without bulging, retraction, inflammation or discharge. Hearing is grossly normal bilaterally. Nose:  External nasal examination shows no deformity or inflammation. Nasal mucosa are pink and moist without lesions or exudates. Mouth:  Oral mucosa and oropharynx without lesions or exudates.  Teeth in good repair. Neck:  No deformities, masses, or tenderness noted. Chest Wall:  vesicular rash consistent with shingles Abdomen:  Bowel sounds positive,abdomen soft and non-tender without masses, organomegaly or hernias noted.   Problems:  Medical Problems Added: 1)  Dx of Allergic Rhinitis  (ICD-477.9) 2)  Dx of Prostatitis, Acute  (ICD-601.0) 3)  Dx of Herpes Zoster Nos  (ICD-053.9) 4)  Dx of Back Pain  (ICD-724.5)  Impression & Recommendations:  Problem # 1:  PROSTATITIS, ACUTE (ICD-601.0) Assessment New  Problem # 2:  HERPES ZOSTER NOS (ICD-053.9) Assessment: New  Problem # 3:  ALLERGIC RHINITIS (ICD-477.9) Assessment: New  Complete Medication List: 1)  Triamcinolone Acetonide 0.1 % Oint (Triamcinolone acetonide) .... Apply thin layer twice a day 2)  Clobetasol Propionate 0.05 % Oint (Clobetasol propionate) .... Apply once daily 3)  Lisinopril-hydrochlorothiazide 20-12.5 Mg Tabs (Lisinopril-hydrochlorothiazide)  .... Once daily 4)  Neomycin-polymyxin B Gu 40-200000 Soln (Neomycin-polymyxin b gu) .... Apply to ears only 5)  Viagra 100 Mg Tabs (Sildenafil citrate) .... Uad 6)  Flexeril 10 Mg Tabs (Cyclobenzaprine hcl) .... Take 1 tablet by mouth three times a day 7)  Desonide 0.05 % Crea (Desonide) .... Twice a month for skin rash 8)  Zovirax 800 Mg Tabs (Acyclovir) .... Take 1 tablet by mouth three times a day 9)  Ciprofloxacin Hcl 500 Mg Tabs (Ciprofloxacin hcl) .... Take 1 tablet by mouth two times a day  Other Orders: UA Dipstick w/o Micro (manual) ([address]147)  Patient Instructions: 1)  Cipro 500 mg twice daily. 2)  Drink 30 ounces of water a day. 3)  Zovirax 800 mg 3 times a day. 4)  Plain Claritin in the morning or plain Zyrtec at bedtime for her allergy symptoms. 5)  Return in one week for follow-up, sooner if any problems Prescriptions: CIPROFLOXACIN HCL 500 MG TABS (CIPROFLOXACIN HCL) Take 1 tablet by mouth two times a day  #30 x 1   Entered and Authorized by:   Roderick Pee MD   Signed by:   Roderick Pee MD on 08/01/2010   Method used:   [facility]        [facility]  [address] [address] [id number]       [address]       [place], [place]  [address]       Ph: [phone] or [phone]       Fax: [phone]   RxID:   [id number] ZOVIRAX 800 MG TABS (ACYCLOVIR) Take 1 tablet by mouth three times a day  #40 x 1   Entered and Authorized by:   Roderick Pee MD   Signed by:   Roderick Pee MD on 08/01/2010   Method used:   [facility]        [facility]  [address] [address] [facility]       [address]       [place], [place]  [address]       Ph: [phone] or [phone]       Fax: [phone]   RxID:   [id number]    Orders Added: 1)  UA Dipstick w/o Micro (manual) [81002] 2)  Est. Patient Level IV [31517]    Laboratory Results   Urine Tests  Date/Time Received: August 01, 2010   Routine Urinalysis   Color:  yellow Appearance: Clear Glucose: negative   (Normal Range: Negative) Bilirubin: negative   (Normal Range: Negative) Ketone: negative   (Normal Range: Negative) Spec. Gravity: 1.015   (Normal Range: 1.003-1.035) Blood: negative   (Normal Range: Negative) pH: 5.0   (Normal Range: 5.0-8.0) Protein: negative   (Normal Range: Negative) Urobilinogen: 0.2   (Normal Range: 0-1) Nitrite: negative   (Normal Range: Negative) Leukocyte Esterace: negative   (Normal Range: Negative)    Comments: Kern Reap CMA Duncan Dull)  August 01, 2010 12:44 PM

## 2010-09-02 ENCOUNTER — Encounter: Payer: Self-pay | Admitting: Family Medicine

## 2010-09-02 ENCOUNTER — Ambulatory Visit (INDEPENDENT_AMBULATORY_CARE_PROVIDER_SITE_OTHER): Payer: PRIVATE HEALTH INSURANCE | Admitting: Family Medicine

## 2010-09-02 VITALS — BP 140/80 | Temp 97.9°F | Ht 72.0 in | Wt 221.0 lb

## 2010-09-02 DIAGNOSIS — G43909 Migraine, unspecified, not intractable, without status migrainosus: Secondary | ICD-10-CM

## 2010-09-02 MED ORDER — PREDNISONE 20 MG PO TABS: ORAL_TABLET | ORAL | Status: DC

## 2010-09-02 MED ORDER — HYDROCODONE-ACETAMINOPHEN 7.5-750 MG PO TABS: 1.0000 | ORAL_TABLET | Freq: Four times a day (QID) | ORAL | Status: AC | PRN

## 2010-09-02 NOTE — Patient Instructions (Signed)
Take to prednisone tablets now then two tabs q.a.m. Until headache gone, then taper by taking one tablet x 3 days, a half x 3 days, then half a tablet every other day for a two-week taper.  Take a half a Vicodin every 4 to 6 hours as needed for severe pain.  Return p.r.n.  Slowly decreased her caffeine consumption to ,,,,,,,,,,4 ounce cup of tea or coffee per day.  Stop all the sodas

## 2010-09-02 NOTE — Progress Notes (Signed)
  Subjective:    Patient ID: Daniel Guerrero, male    DOB: 08/03/1945, 65 y.o.   MRN: [id number]  HPI Daniel Guerrero is a 65 year old male, married, nonsmoker comes in today for evaluation of a headache for 3 months.  He states about 3 months ago he began having chronic daily headaches.  He states in the morning.  He feels fairly well but during the day.  The headaches start to build in intensity and severity.  It can be anywhere from a 3 to a 10 on a scale of one to 10.  He occasionally has some nausea, but no vomiting.  No complaints of photophobia nor phonophobia.  He does feel better if he can get a quiet dark room in good sleep.  He said migraine headaches in the past, but no cluster like he is having now.     Review of Systems    Neurologic review of systems negative Objective:   Physical Exam Well-developed well-nourished, male in no acute distress.  Examination of the HEENT is negative.  The neck was supple.  Thyroid not enlarged.  Neurologic exam shows A&O  x 3.  Cranial nerves two through 12 are intact, sensory, motor muscle strength, reflexes, cerebellar all normal.       Assessment & Plan:  Cluster migraines.  Recommend heat, back off his caffeine.  He is consuming one pot of coffee a day.  Also begin prednisone 40 mg daily until headache stops then taper.  Vicodin p.r.n., until l prednisone begins to work

## 2010-09-09 ENCOUNTER — Encounter: Payer: Self-pay | Admitting: Family Medicine

## 2010-09-09 ENCOUNTER — Ambulatory Visit (INDEPENDENT_AMBULATORY_CARE_PROVIDER_SITE_OTHER): Payer: BC Managed Care – PPO | Admitting: Family Medicine

## 2010-09-09 VITALS — BP 140/90 | Temp 98.0°F | Wt 222.0 lb

## 2010-09-09 DIAGNOSIS — G43019 Migraine without aura, intractable, without status migrainosus: Secondary | ICD-10-CM | POA: Insufficient documentation

## 2010-09-09 HISTORY — DX: Migraine without aura, intractable, without status migrainosus: G43.019

## 2010-09-09 NOTE — Patient Instructions (Signed)
Takes 40 mg of prednisone daily until your headache is completely gone and then begin a taper by taking 30 mg x 3 days, 20 mg x 3 days, 10 mg x 3 days, then 10 mg every other day for a two-week taper.  Return p.r.n.

## 2010-09-09 NOTE — Progress Notes (Signed)
  Subjective:    Patient ID: Daniel Guerrero, male    DOB: Oct 30, 1945, 65 y.o.   MRN: [id number]  HPIjohn Is a 65 year old male, nonsmoker, who comes in today for follow-up of cluster migraines.  We saw him a week ago with cluster migraines and start him on prednisone.  Is currently on 40 mg a day, and he states his headache is almost gone.  He states now to one, where before it was a 6 to 8.  He feels might better.  No major side effects from medication    Review of Systems    Neurologic review of systems negative Objective:   Physical Exam    Well-developed well-nourished, male in no acute distress    Assessment & Plan:  Cluster migraine.  Take the prednisone tablets until headache is completely gone and then taper slowly as outlined.  Return p.r.n.

## 2010-09-16 ENCOUNTER — Encounter: Payer: Self-pay | Admitting: Internal Medicine

## 2010-09-16 ENCOUNTER — Ambulatory Visit (INDEPENDENT_AMBULATORY_CARE_PROVIDER_SITE_OTHER): Payer: BC Managed Care – PPO | Admitting: Internal Medicine

## 2010-09-16 DIAGNOSIS — R1310 Dysphagia, unspecified: Secondary | ICD-10-CM

## 2010-09-20 NOTE — Assessment & Plan Note (Signed)
Summary:  DYSPHASIA (new patient)   History of Present Illness Visit Type: Initial Consult Primary GI MD: Yancey Flemings MD Primary Provider: Kelle Darting, MD Requesting Provider: Kelle Darting, MD Chief Complaint: Patient c/o 2 years intermittent difficulty with meats getting stuck in what feels like midsternal region. He denies any significant reflux or other GI symptoms. History of Present Illness:    65 year old white male with a history of hypertension , obesity, type 2 diabetes mellitus, and eczema. Presents today with a chief complaint of dysphagia. Patient reports a several year history of intermittent solid food dysphagia to items such as meat. He denies reflux symptoms. 13 pound weight loss in the past 2 months. No other GI complaints. He tells me that he had a complete colonoscopy, elsewhere, about 5 years ago. Apparently no abnormalities. He does not recall with whom or where , the we'll try to get this information with the assistance of his wife. GI review of systems is otherwise negative as mentioned. Patient is currently on prednisone for headaches   GI Review of Systems    Reports loss of appetite and  weight loss.      Denies abdominal pain, acid reflux, belching, bloating, chest pain, dysphagia with liquids, dysphagia with solids, heartburn, nausea, vomiting, vomiting blood, and  weight gain.        Denies anal fissure, black tarry stools, change in bowel habit, constipation, diarrhea, diverticulosis, fecal incontinence, heme positive stool, hemorrhoids, irritable bowel syndrome, jaundice, light color stool, liver problems, rectal bleeding, and  rectal pain. Preventive Screening-Counseling & Management  Alcohol-Tobacco     Smoking Status: quit    Current Medications (verified): 1)  Triamcinolone Acetonide 0.1 %  Oint (Triamcinolone Acetonide) .... Apply Thin Layer Twice A Day 2)  Clobetasol Propionate 0.05 %  Oint (Clobetasol Propionate) .... Apply Once Daily 3)   Lisinopril-Hydrochlorothiazide 20-12.5 Mg  Tabs (Lisinopril-Hydrochlorothiazide) .... Once Daily 4)  Neomycin-Polymyxin B Gu 40-200000  Soln (Neomycin-Polymyxin B Gu) .... Apply To Ears Only 5)  Viagra 100 Mg  Tabs (Sildenafil Citrate) .... Uad 6)  Flexeril 10 Mg Tabs (Cyclobenzaprine Hcl) .... Take 1 Tablet By Mouth Three Times A Day 7)  Desonide 0.05 % Crea (Desonide) .... Twice A Month For Skin Rash 8)  Zovirax 800 Mg Tabs (Acyclovir) .... Take 1 Tablet By Mouth Three Times A Day 9)  Ciprofloxacin Hcl 500 Mg Tabs (Ciprofloxacin Hcl) .... Take 1 Tablet By Mouth Two Times A Day 10)  Prednisone 20 Mg Tabs (Prednisone) .... Take 2 Tablets By Mouth Once Daily As Directed 11)  Claritin 10 Mg Tabs (Loratadine) .... Take 1 Tablet By Mouth Once A Day As Needed  Allergies (verified): 1)  ! Penicillin 2)  ! * Peanuts  Past History:  Past Medical History: Reviewed history from 12/13/2007 and no changes required. Diabetes mellitus, type II Hypertension eczema psoriasis appendectomy right left hernia repair tonsillectomy bilateral cataracts high-frequency hearing loss erectile dysfunction overweight  Past Surgical History: Appendectomy Tonsillectomy Hernia Repair-bilateral  Family History: father died in his 62s, had a triple a smoker, diabetic mother died at 37 of lung cancer also, history of thyroid disease one brother one sister in good health No FH of Colon Cancer:  Social History: Occupation: Married Drug use-no Regular exercise-no Patient is a former smoker. -stopped 20-30 years ago Alcohol Use - yes-12 oz wine/beer daily Daily Caffeine Use-5-6 drinks daily Smoking Status:  quit  Review of Systems       The patient complains of back pain  and headaches-new.  The patient denies allergy/sinus, anemia, anxiety-new, arthritis/joint pain, blood in urine, breast changes/lumps, change in vision, confusion, cough, coughing up blood, depression-new, fainting, fatigue, fever,  hearing problems, heart murmur, heart rhythm changes, itching, menstrual pain, muscle pains/cramps, night sweats, nosebleeds, pregnancy symptoms, shortness of breath, skin rash, sleeping problems, sore throat, swelling of feet/legs, swollen lymph glands, thirst - excessive , urination - excessive , urination changes/pain, urine leakage, vision changes, and voice change.    Vital Signs:  Patient profile:   65 year old male Height:      69.75 inches Weight:      221.38 pounds BMI:     32.11 BSA:     2.18 Pulse rate:   72 / minute Pulse rhythm:   regular BP sitting:   122 / 72  (left arm)  Vitals Entered By: Lamona Curl CMA Duncan Dull) (September 16, 2010 9:28 AM)  Physical Exam  General:  Well developed, well nourished, no acute distress. Head:  Normocephalic and atraumatic. Eyes:  PERRLA, no icterus. bilateral cataracts Mouth:  No deformity or lesions. Neck:  Supple; no masses or thyromegaly. Lungs:  Clear throughout to auscultation. Heart:  Regular rate and rhythm; no murmurs, rubs,  or bruits. Abdomen:  Soft, nontender and nondistended. No masses, hepatosplenomegaly or hernias noted. Normal bowel sounds. Msk:  Symmetrical with no gross deformities. Normal posture. Pulses:  Normal pulses noted. Extremities:  No clubbing, cyanosis, edema or deformities noted. Neurologic:  Alert and  oriented x4. Skin:  Intact without significant lesions or rashes. Psych:  Alert and cooperative. Normal mood and affect.   Impression & Recommendations:  Problem # 1:  DYSPHAGIA (ICD-53.80)  65 year old with a several year history of intermittent solid food dysphagia. Rule out peptic stricture or ring.    plan: #1. upper endoscopy with esophageal dilation. the nature of the procedure as well as the risks, benefits, and alternatives were reviewed. he understood and agreed to proceed  Other Orders: EGD SAV (EGD SAV)  Patient Instructions: 1)  EGD dil [facility] 09/23/10 10:30 am arrive at 9:30 am on  4th floor 2)  Upper Endoscopy brochure given.  3)  Upper Endoscopy with Dilatation brochure given.  4)  Copy sent to : Kelle Darting, MD 5)  The medication list was reviewed and reconciled.  All changed / newly prescribed medications were explained.  A complete medication list was provided to the patient / caregiver.

## 2010-09-20 NOTE — Letter (Signed)
Summary: EGD Instructions  [facility]  [address] [place], [place] [address]   Phone: [phone]  Fax: [phone]       Daniel Guerrero    September 23, 1945    MRN: [id number]       Procedure Day /Date:FRIDAY, 09/23/10     Arrival Time: 9:30 AM     Procedure Time:10:30 AM     Location of Procedure:                    [facility] (4th Floor)   PREPARATION FOR ENDOSCOPY/DIL   On FRIDAY THE DAY OF THE PROCEDURE:  1.   No solid foods, milk or milk products are allowed after midnight the night before your procedure.  2.   Do not drink anything colored red or purple.  Avoid juices with pulp.  No orange juice.  3.  You may drink clear liquids until 8:30 AM, which is 2 hours before your procedure.                                                                                                CLEAR LIQUIDS INCLUDE: Water Jello Ice Popsicles Tea (sugar ok, no milk/cream) Powdered fruit flavored drinks Coffee (sugar ok, no milk/cream) Gatorade Juice: apple, white grape, white cranberry  Lemonade Clear bullion, consomm, broth Carbonated beverages (any kind) Strained chicken noodle soup Hard Candy   MEDICATION INSTRUCTIONS  Unless otherwise instructed, you should take regular prescription medications with a small sip of water as early as possible the morning of your procedure.          OTHER INSTRUCTIONS  You will need a responsible adult at least 65 years of age to accompany you and drive you home.   This person must remain in the waiting room during your procedure.  Wear loose fitting clothing that is easily removed.  Leave jewelry and other valuables at home.  However, you may wish to bring a book to read or an iPod/MP3 player to listen to music as you wait for your procedure to start.  Remove all body piercing jewelry and leave at home.  Total time from sign-in until discharge is approximately 2-3 hours.  You should go home directly  after your procedure and rest.  You can resume normal activities the day after your procedure.  The day of your procedure you should not:   Drive   Make legal decisions   Operate machinery   Drink alcohol   Return to work  You will receive specific instructions about eating, activities and medications before you leave.    The above instructions have been reviewed and explained to me by   _______________________    I fully understand and can verbalize these instructions _____________________________ Date _________

## 2010-09-20 NOTE — Initial Assessments (Signed)
Summary: Consultation    NAME:  Daniel Guerrero, Daniel Guerrero NO.:  [id number]      MEDICAL RECORD NO.:  [id number]           PATIENT TYPE:      LOCATION:                                 FACILITY:      PHYSICIAN:  Wilson Singer, M.D.DATE OF BIRTH:  07-18-1946      DATE OF CONSULTATION:   DATE OF DISCHARGE:                                    CONSULTATION         REFERRING PHYSICIAN:  Shelda Jakes, MD, ER physician at [facility]   Emergency Room.      CONSULTING PHYSICIAN:  Wilson Singer, M.D., [facility].      REASON FOR CONSULTATION:  Chest pain.      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 having had the onset of chest pain across   his chest at 4:00 a.m. in the morning of August 02, 2010.  The pain was   of a tight nature, but was only at 2/10 in severity.  It was not   associated with dyspnea, sweating, or nausea or vomiting.  The pain did   not seem to be pleuritic in nature.  There is no cough or fever   associated with it.  When he was evaluated in the emergency room,   cardiac markers have been negative.  A D-dimer was elevated and a CT   angiogram of the chest did not show any evidence of pulmonary embolism   nor were there any evidence of lung pathology.  I was now asked to   evaluate this chest pain.  He had gone to his primary care physician,   Dr. Kelle Darting, yesterday for chills and fever and he was diagnosed   with a prostatitis and put on ciprofloxacin.  Also during this visit, he   was noted to have shingles around his left back and he was put on   acyclovir.  He was given ibuprofen for the chest pain.      PAST MEDICAL HISTORY:  Significant for hypertension, duration of 2   years.      PAST SURGICAL HISTORY:  Appendectomy and tonsillectomy as a child,   bilateral inguinal hernia repair approximately 20 years ago.      FAMILY HISTORY:  Noncontributory in terms of heart  disease.      SOCIAL HISTORY:  He is married.  He does not smoke, does not drink   alcohol.  He is a Runner, broadcasting/film/video at [employer].      ALLERGIES:  PENICILLIN.      MEDICATIONS:  Lisinopril, dose unknown; acyclovir 800 mg t.i.d.;   ciprofloxacin 500 mg b.i.d.; ibuprofen as needed; aspirin as needed;   multivitamins.      REVIEW OF SYSTEMS:  Apart from the symptoms mentioned above, there are   no other symptoms referable to all systems reviewed.      PHYSICAL EXAMINATION:  GENERAL:  The patient is afebrile and   hemodynamically stable.  VITAL SIGNS:  Blood pressure 123/69, temperature 98.8, pulse 16 in sinus   rhythm, respiratory rate 12, saturation 98% on room air.   He is not peripherally or central cyanosed.  He had there is no   increased work of breathing.  He is not jaundiced.  He is not clubbed.   He is not clinically anemic.   CARDIOVASCULAR:  Heart sounds are present, normal without murmurs or   pericardial rub.  Jugular venous pressure is not elevated.  Lung fields   anteriorly and posteriorly are clear.  There is anterior chest wall   tenderness on the left side corresponding to his pain and reproducing   his pain.  There is no pleural rub.   ABDOMEN:  Soft and nontender with no evidence of hepatosplenomegaly.   NEUROLOGIC:  He is alert and oriented without any focal neurologic   signs.   SKIN:  There is clearly shingles rash in the left back area.   MUSCULOSKELETAL:  No abnormalities.      INVESTIGATIONS:  CT angiogram of the chest shows no evidence of   pulmonary embolism but an incidental note is made of multiple hepatic   lesions, which may well be cavernous hemangiomata, but further   characterization cannot be made on this study.  It is recommended that   an abdominal MRI be done with and without contrast if there is concern   and there is no evidence of previous ultrasounds.  Chest x-ray was   negative and not indicative of any cardiopulmonary process.  Sodium  133,   potassium 3.8, bicarbonate 25, glucose 113, BUN 13, creatinine 1.09.   Hemoglobin 14.4, white blood cell count 5.7, platelets 147, D-dimer   elevated at 0.54.  Cardiac markers all negative with troponin less than   0.05.  CK-MB less than 1.0.      PROBLEM LIST:   1. Neuropathic versus muscular chest pain.  This pain does not appear       to be cardiac in nature at all.  He has negative cardiac markers.       ECG is within normal limits except for first-degree heart block and       CT angiogram is negative for pulmonary embolism.   2. Shingles.   3. Hypertension.      PLAN:   1. Discharge the patient to home with the use of nonsteroidal anti-       inflammatory drugs for pain.   2. Followup with his primary care physician, Dr. Kelle Darting, on       January 12 per his request in the afternoon.  I have spoken with       Dr. Tawanna Cooler and he agrees discharging from the emergency room is       appropriate at this time.               Wilson Singer, M.D.               NCG/MEDQ  D:  08/02/2010  T:  08/03/2010  Job:  [id number]      Electronically Signed by Lilly Cove M.D. on 08/04/2010 11:28:30 AM

## 2010-09-23 ENCOUNTER — Other Ambulatory Visit: Payer: Self-pay | Admitting: Internal Medicine

## 2010-09-23 ENCOUNTER — Encounter (AMBULATORY_SURGERY_CENTER): Payer: BC Managed Care – PPO | Admitting: Internal Medicine

## 2010-09-23 ENCOUNTER — Encounter: Payer: Self-pay | Admitting: Internal Medicine

## 2010-09-23 DIAGNOSIS — K219 Gastro-esophageal reflux disease without esophagitis: Secondary | ICD-10-CM

## 2010-09-23 DIAGNOSIS — K2960 Other gastritis without bleeding: Secondary | ICD-10-CM

## 2010-09-23 DIAGNOSIS — R1310 Dysphagia, unspecified: Secondary | ICD-10-CM

## 2010-09-26 ENCOUNTER — Encounter: Payer: Self-pay | Admitting: Internal Medicine

## 2010-09-26 LAB — HELICOBACTER PYLORI SCREEN-BIOPSY: UREASE: NEGATIVE

## 2010-09-29 NOTE — Procedures (Addendum)
Summary: Upper Endoscopy  Patient: Daniel Guerrero Note: All result statuses are Final unless otherwise noted.  Tests: (1) Upper Endoscopy (EGD)   EGD Upper Endoscopy       DONE     [facility]     [address]     [place], [place]  [address]          ENDOSCOPY PROCEDURE REPORT          PATIENT:  Daniel Guerrero, Daniel Guerrero  MR#:  [id number]     BIRTHDATE:  05/19/1946, 64 yrs. old  GENDER:  male          ENDOSCOPIST:  Kaevon Cotta. Eda Keys, MD     Referred by:  Eugenio Hoes Tawanna Cooler, M.D.          PROCEDURE DATE:  09/23/2010     PROCEDURE:  EGD with biopsy, [address]     ASA CLASS:  Class II     INDICATIONS:  dysphagia          MEDICATIONS:   Fentanyl 75 mcg IV, Versed 7 mg IV     TOPICAL ANESTHETIC:  Exactacain Spray          DESCRIPTION OF PROCEDURE:   After the risks benefits and     alternatives of the procedure were thoroughly explained, informed     consent was obtained.  The LB GIF-H180 [id number] endoscope was     introduced through the mouth and advanced to the second portion of     the duodenum, without limitations.  The instrument was slowly     withdrawn as the mucosa was fully examined.     <<PROCEDUREIMAGES>>          Esophagitis, as manifested by inflammation with edema and focal     ulceration,  was found in the distal esophagus.No Barrett's or     discernable stricture.  Multiple erosions were found in the     antrum.  Otherwise normal stomach. Clo Bx taken. The duodenal bulb     was normal in appearance, as was the postbulbar duodenum.     Retroflexed views revealed no abnormalities.    The scope was then     withdrawn from the patient and the procedure completed. Elected     not to dilate given the significant esophagitis and lack of     obvious stricture.          COMPLICATIONS:  None          ENDOSCOPIC IMPRESSION:     1) Esophagitis in the distal esophagus     2) Erosions, multiple in the antrum     3) Otherwise normal stomach     4) Normal duodenum     5)  GERD     RECOMMENDATIONS:     1) Anti-reflux regimen to be followed     2) Nexium 40mg  po daily; #30; 11refills     3) Call office next 2-3 days to schedule an office appointment     for 6 weeks          _____________________________     Wilhemina Bonito. Eda Keys, MD          CC:  The Patient;  Roderick Pee, MD          n.     Rosalie DoctorWilhemina Bonito. Eda Keys at 09/23/2010 11:59 AM          Ashok Pall, [phone]  Note: An exclamation mark (!) indicates  a result that was not dispersed into the flowsheet. Document Creation Date: 09/23/2010 11:59 AM _______________________________________________________________________  (1) Order result status: Final Collection or observation date-time: 09/23/2010 11:45 Requested date-time:  Receipt date-time:  Reported date-time:  Referring Physician:   Ordering Physician: Fransico Setters [phone]) Specimen Source:  Source: Launa Grill Order Number: [id number] Lab site:

## 2010-09-29 NOTE — Miscellaneous (Signed)
Summary: Clo-Test  Clinical Lists Changes  Orders: Added new Test order of TLB-H Pylori Screen Gastric Biopsy (83013-CLOTEST) - Signed  Appended Document: Orders Update    Clinical Lists Changes  Orders: Added new Test order of TLB-H Pylori Screen Gastric Biopsy (83013-CLOTEST) - Signed

## 2010-09-29 NOTE — Miscellaneous (Signed)
Summary: Nexium RX  Clinical Lists Changes  Medications: Added new medication of NEXIUM 40 MG CPDR (ESOMEPRAZOLE MAGNESIUM) Take 1 tablet daily - Signed Rx of NEXIUM 40 MG CPDR (ESOMEPRAZOLE MAGNESIUM) Take 1 tablet daily;  #30 x 11;  Signed;  Entered by: Grier Rocher, RN;  Authorized by: Hilarie Fredrickson MD;  Method used: Electronically to [facility]  [id number] [address] [place], [place], [place]  [address], Ph: [phone] or [phone], Fax: [phone]    Prescriptions: NEXIUM 40 MG CPDR (ESOMEPRAZOLE MAGNESIUM) Take 1 tablet daily  #30 x 11   Entered by:   Grier Rocher, RN   Authorized by:   Hilarie Fredrickson MD   Signed by:   Grier Rocher, RN on 09/23/2010   Method used:   Electronically to        [facility]  [address] [id number] (retail)       [address]       [place], [place]  [address]       Ph: [phone] or [phone]       Fax: [phone]   RxID:   [id number]

## 2010-10-04 NOTE — Letter (Signed)
Summary: Appt Reminder 2  [facility]  [address] [place], [place] [address]   Phone: [phone]  Fax: [phone]        September 26, 2010 MRN: [id number]    [facility] [address] [place], [place]  [address]    Dear Daniel Guerrero,   You have a return appointment with Dr. Marina Goodell on 11/04/10 at 2:30pm.  Please remember to bring a complete list of the medicines you are taking, your insurance card and your co-pay.  If you have to cancel or reschedule this appointment, please call before 5:00 pm the evening before to avoid a cancellation fee.  If you have any questions or concerns, please call [phone].    Sincerely,    Selinda Michaels RN  Appended Document: Appt Reminder 2 Letter is mailed to the patient's home address

## 2010-10-17 ENCOUNTER — Telehealth: Payer: Self-pay | Admitting: *Deleted

## 2010-10-17 DIAGNOSIS — G43909 Migraine, unspecified, not intractable, without status migrainosus: Secondary | ICD-10-CM

## 2010-10-17 NOTE — Telephone Encounter (Signed)
VM from pt, (hard to understand) questioning if he should continue on the prednisone medication?

## 2010-10-17 NOTE — Telephone Encounter (Signed)
Fleet Contras please call to clarify what the issue is

## 2010-10-18 NOTE — Telephone Encounter (Signed)
Please advise 

## 2010-10-19 MED ORDER — PREDNISONE 20 MG PO TABS: ORAL_TABLET | ORAL | Status: DC

## 2010-10-19 NOTE — Telephone Encounter (Signed)
Take two prednisone tablets for 3 days, one for 3 days, after 3 days, then half a tablet Monday, Wednesday, Friday, for a 3-week taper

## 2010-10-19 NOTE — Telephone Encounter (Signed)
patient  Still has a mild headache should he refill his prednisone?  He did begin to taper.  Does he need a follow up appointment

## 2010-10-19 NOTE — Telephone Encounter (Signed)
Spoke with patient.

## 2010-10-27 ENCOUNTER — Encounter: Payer: Self-pay | Admitting: Family Medicine

## 2010-10-27 ENCOUNTER — Ambulatory Visit (INDEPENDENT_AMBULATORY_CARE_PROVIDER_SITE_OTHER): Payer: BC Managed Care – PPO | Admitting: Family Medicine

## 2010-10-27 DIAGNOSIS — G43019 Migraine without aura, intractable, without status migrainosus: Secondary | ICD-10-CM

## 2010-10-27 DIAGNOSIS — J309 Allergic rhinitis, unspecified: Secondary | ICD-10-CM

## 2010-10-27 MED ORDER — TOPIRAMATE 25 MG PO TABS: ORAL_TABLET | ORAL | Status: DC

## 2010-10-27 NOTE — Patient Instructions (Signed)
Begin Topamax 25 mg a day at bedtime in one week increase the dose to 25 twice daily.  Plain Claritin in the morning for your allergies.  Pack  that left nostril nightly with Vaseline and run a vaporizer in her bedroom at night.  We will get she is set up for a consult with Dr. Wynona Canes at the headache clinic

## 2010-10-27 NOTE — Progress Notes (Signed)
  Subjective:    Patient ID: Daniel Guerrero, male    DOB: [birth year], 65 y.o.   MRN: [id number]  HPI Daniel Guerrero is a 65 year old, married male, nonsmoker, who comes in today accompanied by his son for evaluation of persistent headaches and now new problem with nosebleeds.  We saw him last winter with the cluster migraines.  We gave him a short course and taper prednisone and the headaches went away.  However, they recurred.  We then put him on a second course of prednisone and now he says the headache will go away.  He says he feels fine.  He sleeps during the night okay has a dull headache in the morning, but by noon the headache increases to about an 8.  Again, no neurologic symptoms.  He does have underlying allergic rhinitis, for which he takes plain Claritin daily.  Three days ago.  He had a severe nosebleed from his left nostril.   Review of Systems    General neurologic review of systems negative Objective:   Physical Exam    Well-developed well-nourished, male in no acute distress.  HEENT negative except for clot in the septum.  Left nares.  Neck was supple.  No adenopathy.  Lungs are clear    Assessment & Plan:  Cluster migraine,,,,, unresolved with prednisone x 2,,,,,,,,,, begin Topamax, referred to the headache clinic, Dr. Wynona Guerrero.  Allergic rhinitis,,,,,,,,,, plain Claritin daily.   Left-sided nosebleed,,,,,,,,,, packed with Vaseline nightly for two weeks.  ENT consult if bleeding recurs

## 2010-11-04 ENCOUNTER — Ambulatory Visit (INDEPENDENT_AMBULATORY_CARE_PROVIDER_SITE_OTHER): Payer: BC Managed Care – PPO | Admitting: Internal Medicine

## 2010-11-04 ENCOUNTER — Encounter: Payer: Self-pay | Admitting: Internal Medicine

## 2010-11-04 VITALS — BP 114/60 | HR 84 | Ht 72.0 in | Wt 223.6 lb

## 2010-11-04 DIAGNOSIS — R1310 Dysphagia, unspecified: Secondary | ICD-10-CM

## 2010-11-04 DIAGNOSIS — K219 Gastro-esophageal reflux disease without esophagitis: Secondary | ICD-10-CM

## 2010-11-04 NOTE — Progress Notes (Signed)
HISTORY OF PRESENT ILLNESS:  Daniel Guerrero is a 65 y.o. male with the below listed medical history who presents today for followup post endoscopy. He was initially evaluated 09/16/2010 regarding a 2 year history of intermittent dysphagia. See that dictation for details. He subsequently underwent upper endoscopy on 09/23/2010. He was found to have erosive esophagitis as well as antral erosions. Testing for Helicobacter pylori negative. No obvious esophageal stricture but significant edema. He was placed on Nexium 40 mg daily and follows up at this time. He is accompanied by his wife. From about 10 days after initiating therapy until this time he has had marked improvement in swallowing. Specifically, no difficulties whatsoever. He is tolerating the medication without issue. New problems. GI review of systems currently negative.  REVIEW OF SYSTEMS:  All non-GI ROS negative except for back pain, headaches, insomnia, and rash.  Past Medical History  Diagnosis Date  . Diabetes mellitus     type II  . Hypertension   . Eczema   . Psoriasis   . Hearing loss     high frequency  . Cataract of both eyes   . Erectile dysfunction   . Obesity   . S/P appendectomy   . History of inguinal hernia repair, bilateral   . S/P tonsillectomy   . Loss of hearing     high frequency    Past Surgical History  Procedure Date  . Appendectomy   . Hernia repair     right and left  . Tonsillectomy     Social History Daniel Guerrero  reports that he has quit smoking. He does not have any smokeless tobacco history on file. He reports that he drinks about 1.8 ounces of alcohol per week. He reports that he does not use illicit drugs.  family history includes Cancer in his mother; Diabetes in his father; Lung cancer in his mother; and Thyroid disease in his mother.  There is no history of Colon cancer.  Allergies  Allergen Reactions  . Peanut-Containing Drug Products   . Penicillins     REACTION: hives        PHYSICAL EXAMINATION:  Vital signs: BP 114/60  Pulse 84  Ht 6' (1.829 m)  Wt 223 lb 9.6 oz (101.424 kg)  BMI 30.33 kg/m2 General: Well-developed, well-nourished, no acute distress HEENT: Sclerae are anicteric, conjunctiva pink. Oral mucosa intact Lungs: Clear Heart: Regular Abdomen: soft, nontender, nondistended, no obvious ascites, no peritoneal signs, normal bowel sounds. No organomegaly. Extremities: No edema Psychiatric: alert and oriented x3. Cooperative     ASSESSMENT:  #1. GERD complicated by erosive esophagitis with esophageal edema and resulting dysphagia. Currently asymptomatic on Nexium therapy. We had a long discussion today (greater than 20 minutes) regarding the pathophysiology, treatment, and outcomes of GERD. We also discussed PPI therapy.   PLAN:  #1. Continue Nexium 40 mg daily #2. Reflux precautions #3. Routine followup in about one year. Sooner for interval questions or problems

## 2010-11-04 NOTE — Patient Instructions (Addendum)
Follow up in 1 year. Sooner if needed.  

## 2010-11-07 ENCOUNTER — Encounter: Payer: BC Managed Care – PPO | Admitting: Internal Medicine

## 2010-11-18 ENCOUNTER — Other Ambulatory Visit: Payer: Self-pay | Admitting: Psychiatry

## 2010-11-18 DIAGNOSIS — G43909 Migraine, unspecified, not intractable, without status migrainosus: Secondary | ICD-10-CM

## 2010-12-02 ENCOUNTER — Ambulatory Visit
Admission: RE | Admit: 2010-12-02 | Discharge: 2010-12-02 | Disposition: A | Discharge status: Home / Self Care | Payer: BC Managed Care – PPO | Source: Ambulatory Visit | Attending: Psychiatry | Admitting: Psychiatry

## 2010-12-02 DIAGNOSIS — G43909 Migraine, unspecified, not intractable, without status migrainosus: Secondary | ICD-10-CM

## 2010-12-02 MED ORDER — GADOBENATE DIMEGLUMINE 529 MG/ML IV SOLN
20.0000 mL | Freq: Once | INTRAVENOUS | Status: AC | PRN
  Administered 2010-12-02: 20 mL via INTRAVENOUS

## 2010-12-09 ENCOUNTER — Emergency Department (HOSPITAL_COMMUNITY)
Admission: EM | Admit: 2010-12-09 | Discharge: 2010-12-10 | Disposition: A | Discharge status: Home / Self Care | Payer: BC Managed Care – PPO | Attending: Emergency Medicine | Admitting: Emergency Medicine

## 2010-12-09 DIAGNOSIS — S90569A Insect bite (nonvenomous), unspecified ankle, initial encounter: Secondary | ICD-10-CM | POA: Insufficient documentation

## 2010-12-09 NOTE — Assessment & Plan Note (Signed)
[facility] OFFICE NOTE   NAME:Daniel Guerrero, Daniel Guerrero                      MRN:          [id number]  DATE:10/08/2006                            DOB:          [birth year]    PRIMARY CARE PHYSICIAN:  Dr. Alonza Smoker   Daniel Guerrero is a 65 year old married male who comes in today for  physical evaluation, new patient, because of fatigue, decrease mental  capacity, sluggishness and worrying a lot.   PAST MEDICAL HISTORY:  T&A as a child.  Appendectomy.  Fractured nose.  He had bilateral cataracts and lens implants and bilateral hernia  repair.  An outpatient cystoscopy when he was in the [employer].   PAST ILLNESSES:  None.   INJURIES:  None.   DRUG ALLERGIES:  AMOXICILLIN and PEANUTS give him hives.   He does not smoke or drink any alcohol, except for an occasional drink.   He does not take any medicines for anything.   REVIEW OF SYSTEMS:  Head, eyes, ears, nose and throat were negative,  except he had a concussion when he was 13.  No neurologic sequelae.  He  gets yearly eye exams.  He has high frequency hearing loss from his  service in the [employer], he wears ear protection.  He gets regular  dental care.  Cardiopulmonary negative.  GI negative.  He had a  colonoscopy in 2003 that was normal.  Told to return in 10 years.  GU  negative.  Weight has been up.  Musculoskeletal is negative.  Vascular  negative.  Allergy pertinent, he has allergic rhinitis.  Psychological  review of systems negative except he seemed to have some SAD, especially  obviously in the winter time.  He says he has been feeling better the  past week since the sun has been up longer and he is getting more sun  exposure.   SOCIAL HISTORY:  He is married, lives here in [place].  Works as a  Runner, broadcasting/film/video at [employer].  Married.  One son.   FAMILY HISTORY:  Dad died in his 26s; AAA, smoker, diabetes type 2.  Mother died in her 31s,  CA of the lung secondary smoke from the husband.  Also had thyroid problems.  One brother, one sister both in good  health.  No problems.   VACCINATION HISTORY:  Last tetanus in 1998.   PHYSICAL EVALUATION:  VITAL SIGNS:  The height was 5 foot 10, weight  226.  Temp 97.2, pulse 70 and regular.  He is afebrile.  BP 129/78.  GENERAL:  He is a well-developed, well-nourished white male in no acute  distress.  Examination head, eyes, ears, nose and throat were negative.  NECK:  Was supple.  Thyroid not enlarged.  No carotid bruits.  CHEST:  Is clear to auscultation.  CARDIAC:  Exam negative.  ABDOMEN:  Exam was negative except for fairly large panniculus.  GENITALIA:  Normal circumcised male.  RECTAL:  Normal.  Stool guaiac negative.  Prostate normal.  EXTREMITIES:  Normal skin.  Peripheral pulses normal.   LABORATORY DATA:  Shows  a normal EKG.  Other labs pending.   IMPRESSION:  1. Fatigue, forgetfulness, etc.  May be thyroid, may be SAD.  Going to      check labs and call.  2. Generalized pruritus on his back.  Triamcinolone with Eucerin cream      nightly.  3. Patient given Adacel to catch him up on his vaccinations and we      will call him about his lab work.  4. He is status post bilateral cataracts.  5. Status post tonsillectomy and adenoidectomy.  6. Status post appendectomy.  7. Status post bilateral hernia repair.  8. Erectile dysfunction, prescribed Viagra 50 mg 1/2 tablet p.r.n.   An hour was spent going through his history, physical findings,  assessment and plan.     Jeffrey A. Tawanna Cooler, MD  Electronically Signed    JAT/MedQ  DD: 10/08/2006  DT: 10/09/2006  Job #: [id number]

## 2010-12-12 ENCOUNTER — Telehealth: Payer: Self-pay | Admitting: *Deleted

## 2010-12-12 NOTE — Telephone Encounter (Signed)
[facility] Triage Call Report Triage Record Num: [id number] Operator: Merlinda Frederick Patient Name: Daniel Guerrero Call Date & Time: 12/09/2010 8:25:53PM Patient Phone: [phone] PCP: Eugenio Hoes. Todd Patient Gender: Male PCP Fax : [phone] Patient DOB: [birth year] [facility] Name: [facility] Reason for Call: Pt calling 12/09/10 about rash. Pt was bitten by a tick on 12/06/10. Onset of rash 12/09/10. Rash is contained to Bilateral LE, appears as small, pink, raised bumps. Afebrile. Per Bites and Stings Guideline, see in 4 hr disp for hx of tick bite and now has rash. Pt will go to [facility] ED for further evaluation. Protocol(s) Used: Bites and Stings - Insects or Spiders Recommended Outcome per Protocol: See Provider within 4 hours Reason for Outcome: History of tick bite AND now has rash, fever, headache, joint or muscle pain or swollen lymph glands Care Advice: ~ Call provider if symptoms worsen or new symptoms develop. 12/09/2010 8:33:57PM Page 1 of 1 [email or web address]

## 2011-10-13 ENCOUNTER — Other Ambulatory Visit: Payer: BC Managed Care – PPO

## 2011-10-17 ENCOUNTER — Other Ambulatory Visit (INDEPENDENT_AMBULATORY_CARE_PROVIDER_SITE_OTHER): Payer: BC Managed Care – PPO

## 2011-10-17 DIAGNOSIS — Z0000 Encounter for general adult medical examination without abnormal findings: Secondary | ICD-10-CM

## 2011-10-17 LAB — HEPATIC FUNCTION PANEL
ALT: 25 U/L (ref 0–53)
AST: 57 U/L — ABNORMAL HIGH (ref 0–37)
Total Bilirubin: 1.2 mg/dL (ref 0.3–1.2)
Total Protein: 6.8 g/dL (ref 6.0–8.3)

## 2011-10-17 LAB — CBC WITH DIFFERENTIAL/PLATELET
Basophils Relative: 0.6 % (ref 0.0–3.0)
Eosinophils Absolute: 0.3 10*3/uL (ref 0.0–0.7)
Eosinophils Relative: 6.4 % — ABNORMAL HIGH (ref 0.0–5.0)
Lymphocytes Relative: 24.3 % (ref 12.0–46.0)
MCHC: 34.4 g/dL (ref 30.0–36.0)
Monocytes Relative: 10.6 % (ref 3.0–12.0)
Neutrophils Relative %: 58.1 % (ref 43.0–77.0)
RBC: 4.75 Mil/uL (ref 4.22–5.81)
WBC: 5 10*3/uL (ref 4.5–10.5)

## 2011-10-17 LAB — POCT URINALYSIS DIPSTICK
Glucose, UA: NEGATIVE
Ketones, UA: NEGATIVE
Leukocytes, UA: NEGATIVE
Protein, UA: NEGATIVE
Urobilinogen, UA: 0.2

## 2011-10-17 LAB — BASIC METABOLIC PANEL
Calcium: 8.9 mg/dL (ref 8.4–10.5)
Creatinine, Ser: 0.9 mg/dL (ref 0.4–1.5)
GFR: 88.64 mL/min (ref >60.00)

## 2011-10-17 LAB — PSA: PSA: 2.1 ng/mL (ref 0.10–4.00)

## 2011-10-17 LAB — TSH: TSH: 1.08 u[IU]/mL (ref 0.35–5.50)

## 2011-10-17 LAB — LIPID PANEL: VLDL: 21.8 mg/dL (ref 0.0–40.0)

## 2011-10-19 ENCOUNTER — Encounter: Payer: Self-pay | Admitting: Family Medicine

## 2011-10-19 ENCOUNTER — Ambulatory Visit (INDEPENDENT_AMBULATORY_CARE_PROVIDER_SITE_OTHER): Payer: BC Managed Care – PPO | Admitting: Family Medicine

## 2011-10-19 VITALS — BP 124/88 | Temp 97.6°F | Ht 69.0 in | Wt 228.0 lb

## 2011-10-19 DIAGNOSIS — J309 Allergic rhinitis, unspecified: Secondary | ICD-10-CM

## 2011-10-19 DIAGNOSIS — E663 Overweight: Secondary | ICD-10-CM

## 2011-10-19 DIAGNOSIS — H906 Mixed conductive and sensorineural hearing loss, bilateral: Secondary | ICD-10-CM

## 2011-10-19 DIAGNOSIS — L258 Unspecified contact dermatitis due to other agents: Secondary | ICD-10-CM

## 2011-10-19 DIAGNOSIS — R1310 Dysphagia, unspecified: Secondary | ICD-10-CM

## 2011-10-19 DIAGNOSIS — L408 Other psoriasis: Secondary | ICD-10-CM

## 2011-10-19 DIAGNOSIS — G43019 Migraine without aura, intractable, without status migrainosus: Secondary | ICD-10-CM

## 2011-10-19 MED ORDER — CLOBETASOL PROPIONATE 0.05 % EX OINT: TOPICAL_OINTMENT | Freq: Every day | CUTANEOUS | Status: DC

## 2011-10-19 MED ORDER — DESONIDE 0.05 % EX CREA: TOPICAL_CREAM | Freq: Two times a day (BID) | CUTANEOUS | Status: DC

## 2011-10-19 MED ORDER — TRIAMCINOLONE ACETONIDE 0.1 % EX OINT: TOPICAL_OINTMENT | Freq: Two times a day (BID) | CUTANEOUS | Status: DC

## 2011-10-19 MED ORDER — ESOMEPRAZOLE MAGNESIUM 20 MG PO CPDR: 20.0000 mg | DELAYED_RELEASE_CAPSULE | Freq: Every day | ORAL | Status: DC

## 2011-10-19 MED ORDER — SILDENAFIL CITRATE 100 MG PO TABS: 100.0000 mg | ORAL_TABLET | ORAL | Status: DC

## 2011-10-19 NOTE — Patient Instructions (Signed)
Continue your current medications  Call the orthopedic office of Dr. Norlene Campbell to begin an orthopedic evaluation  Continue to work on your diet exercise and weight loss  Followup in 1 year sooner if any problems

## 2011-10-19 NOTE — Progress Notes (Signed)
Subjective:    Patient ID: Daniel Guerrero, male    DOB: Feb 20, 1946, 66 y.o.   MRN: [id number]  HPI Daniel Guerrero is a 66 year old married male nonsmoker who comes in today for a Medicare wellness examination  Has a history of underlying hypertension and was on Zestoretic 20-12.5. He stopped the medication because he got on a diet and exercise and weight loss program and with weight loss his blood pressures dropped back to normal. He's off his medication. 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   HENT: Negative.   Eyes: Negative.   Respiratory: Negative.   Cardiovascular: Negative.   Gastrointestinal: Negative.   Genitourinary: Negative.   Musculoskeletal: Negative.   Skin: Negative.   Neurological: Negative.   Hematological: Negative.   Psychiatric/Behavioral: Negative.        Objective:   Physical Exam  Constitutional: He is oriented to person, place, and time. He appears well-developed and well-nourished.  HENT:  Head: Normocephalic and atraumatic.  Right Ear: External ear normal.  Left Ear: External ear normal.  Nose: Nose normal.  Mouth/Throat: Oropharynx is clear and moist.  Eyes: Conjunctivae and EOM are normal. Pupils are equal, round, and reactive to light.  Neck: Normal range of motion. Neck supple. No JVD  present. No tracheal deviation present. No thyromegaly present.  Cardiovascular: Normal rate, regular rhythm, normal heart sounds and intact distal pulses.  Exam reveals no gallop and no friction rub.   No murmur heard. Pulmonary/Chest: Effort normal and breath sounds normal. No stridor. No respiratory distress. He has no wheezes. He has no rales. He exhibits no tenderness.  Abdominal: Soft. Bowel sounds are normal. He exhibits no distension and no mass. There is no tenderness. There is no rebound and no guarding.       Fairly massive panniculus  Genitourinary: Rectum normal, prostate normal and penis normal. Guaiac negative stool. No penile tenderness.  Musculoskeletal: Normal range of motion. He exhibits no edema and no tenderness.  Lymphadenopathy:    He has no cervical adenopathy.  Neurological: He is alert and oriented to person, place, and time. He has normal reflexes. No cranial nerve deficit. He exhibits normal muscle tone.  Skin: Skin is warm and dry. No rash noted. No erythema. No pallor.  Psychiatric: He has a normal mood and affect. His behavior is normal. Judgment and thought content normal.          Assessment & Plan:  Healthy male  History of hypertension now blood pressure back to normal off medicine. History of glucose intolerance blood sugar normal with diet exercise and weight loss  Psoriasis continue treatment creams  Erectile dysfunction continue Viagra when necessary  Reflux esophagitis continue Nexium 20 mg daily  Back and hip and leg pain referred to Dr. Cleophas Dunker for orthopedic  evaluation 

## 2012-01-22 ENCOUNTER — Ambulatory Visit (INDEPENDENT_AMBULATORY_CARE_PROVIDER_SITE_OTHER): Payer: BC Managed Care – PPO | Admitting: Internal Medicine

## 2012-01-22 ENCOUNTER — Encounter: Payer: Self-pay | Admitting: Internal Medicine

## 2012-01-22 VITALS — BP 140/90 | Temp 101.6°F | Wt 228.0 lb

## 2012-01-22 DIAGNOSIS — I10 Essential (primary) hypertension: Secondary | ICD-10-CM | POA: Diagnosis not present

## 2012-01-22 DIAGNOSIS — R509 Fever, unspecified: Secondary | ICD-10-CM | POA: Diagnosis not present

## 2012-01-22 DIAGNOSIS — W57XXXA Bitten or stung by nonvenomous insect and other nonvenomous arthropods, initial encounter: Secondary | ICD-10-CM | POA: Diagnosis not present

## 2012-01-22 DIAGNOSIS — R197 Diarrhea, unspecified: Secondary | ICD-10-CM

## 2012-01-22 DIAGNOSIS — T148XXA Other injury of unspecified body region, initial encounter: Secondary | ICD-10-CM

## 2012-01-22 DIAGNOSIS — E119 Type 2 diabetes mellitus without complications: Secondary | ICD-10-CM

## 2012-01-22 DIAGNOSIS — T148 Other injury of unspecified body region: Secondary | ICD-10-CM | POA: Diagnosis not present

## 2012-01-22 LAB — COMPREHENSIVE METABOLIC PANEL
ALT: 32 U/L (ref 0–53)
Albumin: 3.9 g/dL (ref 3.5–5.2)
CO2: 25 mEq/L (ref 19–32)
GFR: 69.69 mL/min (ref >60.00)
Glucose, Bld: 126 mg/dL — ABNORMAL HIGH (ref 70–99)
Potassium: 4.3 mEq/L (ref 3.5–5.1)
Sodium: 134 mEq/L — ABNORMAL LOW (ref 135–145)
Total Protein: 6.8 g/dL (ref 6.0–8.3)

## 2012-01-22 LAB — CBC WITH DIFFERENTIAL/PLATELET
Basophils Absolute: 0 10*3/uL (ref 0.0–0.1)
Eosinophils Relative: 0 % (ref 0.0–5.0)
HCT: 43.8 % (ref 39.0–52.0)
Lymphs Abs: 0.4 10*3/uL — ABNORMAL LOW (ref 0.7–4.0)
MCV: 94 fl (ref 78.0–100.0)
Monocytes Absolute: 0.4 10*3/uL (ref 0.1–1.0)
Monocytes Relative: 11.8 % (ref 3.0–12.0)
Neutrophils Relative %: 76.3 % (ref 43.0–77.0)
Platelets: 98 10*3/uL — ABNORMAL LOW (ref 150.0–400.0)
RDW: 13.3 % (ref 11.5–14.6)
WBC: 3.3 10*3/uL — ABNORMAL LOW (ref 4.5–10.5)

## 2012-01-22 LAB — HEMOGLOBIN A1C: Hgb A1c MFr Bld: 5.5 % (ref 4.6–6.5)

## 2012-01-22 MED ORDER — DOXYCYCLINE HYCLATE 100 MG PO TABS: 100.0000 mg | ORAL_TABLET | Freq: Two times a day (BID) | ORAL | Status: AC

## 2012-01-22 NOTE — Progress Notes (Signed)
Subjective:    Patient ID: Daniel Guerrero, male    DOB: 14-Oct-1945, 66 y.o.   MRN: [id number]  HPI  66 year old patient who has a history of diet-controlled diabetes as well as hypertension. He presents with a three-day history of fever temperature has been as high as 102 and has been associated with chills. At the onset of his illness he had 24 hours of watery diarrhea that has resolved no nausea or vomiting or abdominal pain. Today he had an episode of diaphoresis and has felt much improved. He states that he did remove a tick approximately 2 weeks ago. Associated symptoms include mild headache;  he has tolerated liquids and a soft diet  Past Medical History  Diagnosis Date  . Diabetes mellitus     type II  . Hypertension   . Eczema   . Psoriasis   . Hearing loss     high frequency  . Cataract of both eyes   . Erectile dysfunction   . Obesity   . S/P appendectomy   . History of inguinal hernia repair, bilateral   . S/P tonsillectomy   . Loss of hearing     high frequency    History   Social History  . Marital Status: Married    Spouse Name: N/A    Number of Children: N/A  . Years of Education: N/A   Occupational History  . Not on file.   Social History Main Topics  . Smoking status: Former Games developer  . Smokeless tobacco: Not on file   Comment: Quit 20-30 years ago  . Alcohol Use: 1.8 oz/week    1 Glasses of wine, 1 Cans of beer, 1 Shots of liquor per week     occasional  . Drug Use: No  . Sexually Active: Not on file   Other Topics Concern  . Not on file   Social History Narrative   Daily Caffeine use: 5-6 drinks dailyExercising 2 times per [facility] Drug use    Past Surgical History  Procedure Date  . Appendectomy   . Hernia repair     right and left  . Tonsillectomy     Family History  Problem Relation Age of Onset  . Cancer Mother     lung  . Diabetes Father   . Thyroid disease Mother   . Lung cancer Mother   . Colon cancer Neg Hx     Allergies    Allergen Reactions  . Peanut-Containing Drug Products   . Penicillins     REACTION: hives    Current Outpatient Prescriptions on File Prior to Visit  Medication Sig Dispense Refill  . clobetasol ointment (TEMOVATE) 0.05 % Apply topically daily. Apply each bedtime  60 g  3  . cyclobenzaprine (FLEXERIL) 10 MG tablet Take 10 mg by mouth 3 (three) times daily.        Marland Kitchen desonide (DESOWEN) 0.05 % cream Apply topically 2 (two) times daily. Twice a month for skin rash  60 g  3  . esomeprazole (NEXIUM) 20 MG capsule Take 1 capsule (20 mg total) by mouth daily before breakfast.  100 capsule  3  . neomycin-polymyxin-gramicidin (NEOSPORIN) 2.5-10000-0.025 SOLN 1 drop as directed.        . sildenafil (VIAGRA) 100 MG tablet Take 1 tablet (100 mg total) by mouth as directed.  10 tablet  11  . triamcinolone ointment (KENALOG) 0.1 % Apply topically 2 (two) times daily. Apply thin layer  80 g  3  BP 140/90  Temp 101.6 F (38.7 C) (Oral)  Wt 228 lb (103.42 kg)     Review of Systems  Constitutional: Positive for fever, chills, diaphoresis, activity change, appetite change and fatigue.  HENT: Negative for hearing loss, ear pain, congestion, sore throat, rhinorrhea, sneezing, mouth sores, trouble swallowing, neck pain, neck stiffness, dental problem, voice change, sinus pressure and tinnitus.   Eyes: Negative for photophobia, pain, discharge, redness and visual disturbance.  Respiratory: Negative for apnea, cough, choking, chest tightness, shortness of breath, wheezing and stridor.   Cardiovascular: Negative for chest pain, palpitations and leg swelling.  Gastrointestinal: Positive for diarrhea. Negative for nausea, vomiting, abdominal pain, constipation, blood in stool, abdominal distention, anal bleeding and rectal pain.  Genitourinary: Negative for dysuria, urgency, frequency, hematuria, flank pain, decreased urine volume, discharge, penile swelling, scrotal swelling, difficulty urinating, genital  sores and testicular pain.  Musculoskeletal: Negative for myalgias, back pain, joint swelling, arthralgias and gait problem.  Skin: Negative for color change, rash and wound.  Neurological: Positive for headaches. Negative for dizziness, tremors, seizures, syncope, facial asymmetry, speech difficulty, weakness, light-headedness and numbness.  Hematological: Negative for adenopathy. Does not bruise/bleed easily.  Psychiatric/Behavioral: Negative for suicidal ideas, hallucinations, behavioral problems, confusion, disturbed wake/sleep cycle, self-injury, dysphoric mood, decreased concentration and agitation. The patient is not nervous/anxious.        Objective:   Physical Exam  Constitutional: He is oriented to person, place, and time. He appears well-developed and well-nourished. No distress.       Temperature 101.6  HENT:  Head: Normocephalic.  Right Ear: External ear normal.  Left Ear: External ear normal.       Appears well-hydrated  Eyes: Conjunctivae and EOM are normal.  Neck: Normal range of motion. Neck supple.  Cardiovascular: Normal rate, regular rhythm and normal heart sounds.        No tachycardia  Pulmonary/Chest: Effort normal and breath sounds normal. No respiratory distress. He has no wheezes. He has no rales.  Abdominal: Soft. Bowel sounds are normal. He exhibits no distension. There is no tenderness. There is no rebound and no guarding.  Musculoskeletal: Normal range of motion. He exhibits no edema and no tenderness.  Lymphadenopathy:    He has no cervical adenopathy.  Neurological: He is alert and oriented to person, place, and time.  Psychiatric: He has a normal mood and affect. His behavior is normal.          Assessment & Plan:   Acute febrile illness Diarrhea resolved History tick exposure  We'll check a CBC and electrolytes as well as a hemoglobin A1c. We'll force fluids and slowly advance diet We'll empirically treat with doxycycline for 7 days Will  call if any clinical worsening

## 2012-01-22 NOTE — Patient Instructions (Signed)
Drink as much fluid as you  can tolerate over the next few days  Call or return to clinic prn if these symptoms worsen or fail to improve as anticipated.   Take your antibiotic as prescribed until ALL of it is gone, but stop if you develop a rash, swelling, or any side effects of the medication.  Contact our office as soon as possible if  there are side effects of the medication. 

## 2012-05-29 DIAGNOSIS — Z23 Encounter for immunization: Secondary | ICD-10-CM | POA: Diagnosis not present

## 2012-09-11 ENCOUNTER — Encounter (HOSPITAL_COMMUNITY): Payer: Self-pay | Admitting: Emergency Medicine

## 2012-09-11 ENCOUNTER — Emergency Department (HOSPITAL_COMMUNITY)
Admission: EM | Admit: 2012-09-11 | Discharge: 2012-09-12 | Disposition: A | Discharge status: Home / Self Care | Payer: Medicare Other | Attending: Emergency Medicine | Admitting: Emergency Medicine

## 2012-09-11 DIAGNOSIS — Z872 Personal history of diseases of the skin and subcutaneous tissue: Secondary | ICD-10-CM | POA: Insufficient documentation

## 2012-09-11 DIAGNOSIS — Z8669 Personal history of other diseases of the nervous system and sense organs: Secondary | ICD-10-CM | POA: Diagnosis not present

## 2012-09-11 DIAGNOSIS — E119 Type 2 diabetes mellitus without complications: Secondary | ICD-10-CM | POA: Insufficient documentation

## 2012-09-11 DIAGNOSIS — R040 Epistaxis: Secondary | ICD-10-CM | POA: Insufficient documentation

## 2012-09-11 DIAGNOSIS — Z87891 Personal history of nicotine dependence: Secondary | ICD-10-CM | POA: Insufficient documentation

## 2012-09-11 DIAGNOSIS — E669 Obesity, unspecified: Secondary | ICD-10-CM | POA: Insufficient documentation

## 2012-09-11 DIAGNOSIS — Z79899 Other long term (current) drug therapy: Secondary | ICD-10-CM | POA: Insufficient documentation

## 2012-09-11 DIAGNOSIS — Z9889 Other specified postprocedural states: Secondary | ICD-10-CM | POA: Insufficient documentation

## 2012-09-11 DIAGNOSIS — Z87448 Personal history of other diseases of urinary system: Secondary | ICD-10-CM | POA: Diagnosis not present

## 2012-09-11 DIAGNOSIS — Z7982 Long term (current) use of aspirin: Secondary | ICD-10-CM | POA: Insufficient documentation

## 2012-09-11 DIAGNOSIS — I10 Essential (primary) hypertension: Secondary | ICD-10-CM | POA: Insufficient documentation

## 2012-09-11 MED ORDER — OXYMETAZOLINE HCL 0.05 % NA SOLN
NASAL | Status: AC
  Administered 2012-09-12: 01:00:00
  Filled 2012-09-11: qty 15

## 2012-09-11 NOTE — ED Notes (Signed)
Patient states has been having nosebleeds on and off since yesterday; states 2 hours ago, began bleeding and hasn't stopped this time.  Patient states he has been swallowing blood.

## 2012-09-12 ENCOUNTER — Telehealth: Payer: Self-pay | Admitting: Family Medicine

## 2012-09-12 LAB — CBC
HCT: 41.4 % (ref 39.0–52.0)
MCHC: 35 g/dL (ref 30.0–36.0)
RDW: 12.7 % (ref 11.5–15.5)
WBC: 7.4 10*3/uL (ref 4.0–10.5)

## 2012-09-12 NOTE — ED Provider Notes (Signed)
History     CSN: [id number]  Arrival date & time 09/11/12  2202   First MD Initiated Contact with Patient 09/11/12 2320      Chief Complaint  Patient presents with  . Epistaxis    (Consider location/radiation/quality/duration/timing/severity/associated sxs/prior treatment) Patient is a 67 y.o. male presenting with nosebleeds. The history is provided by the patient.  Epistaxis  This is a new problem. The current episode started 12 to 24 hours ago. Episode frequency: intermittently since yesterday. Progression since onset: Bleeding is worsened and brisker tonight, and started around 7 pm. The problem is associated with an unknown (He is on no antcoagulants.  He does take a baby aspirin every other day.) factor. The bleeding has been from the left nare. He has tried applying pressure and ice for the symptoms. The treatment provided no relief. His past medical history does not include bleeding disorder or sinus problems.    Past Medical History  Diagnosis Date  . Diabetes mellitus     type II  . Hypertension   . Eczema   . Psoriasis   . Hearing loss     high frequency  . Cataract of both eyes   . Erectile dysfunction   . Obesity   . S/P appendectomy   . History of inguinal hernia repair, bilateral   . S/P tonsillectomy   . Loss of hearing     high frequency    Past Surgical History  Procedure Laterality Date  . Appendectomy    . Hernia repair      right and left  . Tonsillectomy      Family History  Problem Relation Age of Onset  . Cancer Mother     lung  . Diabetes Father   . Thyroid disease Mother   . Lung cancer Mother   . Colon cancer Neg Hx     History  Substance Use Topics  . Smoking status: Former Games developer  . Smokeless tobacco: Not on file     Comment: Quit 20-30 years ago  . Alcohol Use: 1.8 oz/week    1 Glasses of wine, 1 Cans of beer, 1 Shots of liquor per week     Comment: occasional      Review of Systems  Constitutional: Negative for fever.   HENT: Positive for nosebleeds. Negative for congestion, sore throat and neck pain.   Eyes: Negative.   Respiratory: Negative for chest tightness and shortness of breath.   Cardiovascular: Negative for chest pain.  Gastrointestinal: Negative for nausea and abdominal pain.  Genitourinary: Negative.   Musculoskeletal: Negative for joint swelling and arthralgias.  Skin: Negative.  Negative for rash and wound.  Neurological: Negative for dizziness, weakness, light-headedness, numbness and headaches.  Psychiatric/Behavioral: Negative.     Allergies  Peanut-containing drug products and Penicillins  Home Medications   Current Outpatient Rx  Name  Route  Sig  Dispense  Refill  . aspirin EC 81 MG tablet   Oral   Take 81 mg by mouth daily.         . Cholecalciferol (VITAMIN D PO)   Oral   Take 1 tablet by mouth daily.         . clobetasol ointment (TEMOVATE) 0.05 %   Topical   Apply topically daily. Apply each bedtime   60 g   3   . Cyanocobalamin (VITAMIN B-12 PO)   Oral   Take 1 tablet by mouth daily.         Marland Kitchen desonide (  DESOWEN) 0.05 % cream   Topical   Apply topically 2 (two) times daily. Twice a month for skin rash   60 g   3   . triamcinolone ointment (KENALOG) 0.1 %   Topical   Apply topically 2 (two) times daily. Apply thin layer   80 g   3   . vitamin C (ASCORBIC ACID) 500 MG tablet   Oral   Take 500 mg by mouth daily.           BP 160/85  Pulse 67  Temp(Src) 98.2 F (36.8 C) (Oral)  Resp 20  Ht 6' (1.829 m)  Wt 224 lb (101.606 kg)  BMI 30.37 kg/m2  SpO2 96%  Physical Exam  Nursing note and vitals reviewed. Constitutional: He appears well-developed and well-nourished.  HENT:  Head: Normocephalic and atraumatic.  Nose: Epistaxis is observed.  Left nares, with large clots actively coughing up.  Eyes: Conjunctivae are normal.  Neck: Normal range of motion.  Cardiovascular: Normal rate, regular rhythm, normal heart sounds and intact distal  pulses.   Pulmonary/Chest: Effort normal and breath sounds normal. He has no wheezes.  Abdominal: Soft. Bowel sounds are normal. There is no tenderness.  Musculoskeletal: Normal range of motion.  Neurological: He is alert.  Skin: Skin is warm and dry.  Psychiatric: He has a normal mood and affect.    ED Course  EPISTAXIS MANAGEMENT Date/Time: 09/12/2012 12:00 AM Performed by: Burgess Amor Authorized by: Burgess Amor Consent: Verbal consent obtained. Risks and benefits: risks, benefits and alternatives were discussed Consent given by: patient Time out: Immediately prior to procedure a "time out" was called to verify the correct patient, procedure, equipment, support staff and site/side marked as required. Treatment site: left anterior Repair method: nasal balloon Post-procedure assessment: bleeding stopped Treatment complexity: simple Patient tolerance: Patient tolerated the procedure well with no immediate complications. Comments: First afrin spray and pressure attempted with no resolution of bleeding.  Unable to visualize bleeding site due to briskness of bleeding.  4.5 cm rhino rocket placed with blood coming from right nares.  Replaced with 7.5 cm rhino rocket with complete resolution of epistaxis.    (including critical care time)  Labs Reviewed  CBC   No results found.   1. Epistaxis       MDM  Labs reviewed prior to dc home.  Patients nose bleed has stabilized after observation here once rhino rocket placed.  Planned recheck in 2 days and removal of packing.  Return here sooner for any rebleeding.        Burgess Amor, [place] 09/12/12 [phone]

## 2012-09-12 NOTE — ED Provider Notes (Signed)
Medical screening examination/treatment/procedure(s) were performed by non-physician practitioner and as supervising physician I was immediately available for consultation/collaboration.  Nicoletta Dress. Colon Branch, MD 09/12/12 2956

## 2012-09-12 NOTE — Telephone Encounter (Signed)
[facility] Triage Call Report Triage Record Num: [id number] Operator: Rebeca Allegra Patient Name: Ely Ballen Call Date & Time: 09/11/2012 9:32:36PM Patient Phone: [phone] PCP: Eugenio Hoes. Todd Patient Gender: Male PCP Fax : [phone] Patient DOB: [birth year] [facility] Name: [facility] Reason for Call: Caller: Marshawn/Patient; PCP: Kelle [facility]ing [facility]); CB#: [phone]; Call regarding Nosebleeds; onset 09/10/12. 09/11/12-x4 episodes. Afebrile. Pt is having slight bleeding at this time. 09/11/12 2140 159/106. All emergent symptoms ruled out per Nosebleed protocol with the exception of "Bleeding from one or both nostrils initially controlled with direct pressure and another episode of bleeding the same day". Advised pt to be seen at [facility] ER within the next 4 hrs. Protocol(s) Used: Nosebleed Recommended Outcome per Protocol: See Provider within 4 hours Reason for Outcome: Bleeding from one or both nostrils initially controlled with direct pressure AND another episode of bleeding the same day Care Advice: ~ 02/

## 2012-09-12 NOTE — ED Notes (Signed)
Discharge instructions reviewed with pt, questions answered. Pt verbalized understanding.  

## 2012-10-24 DIAGNOSIS — R799 Abnormal finding of blood chemistry, unspecified: Secondary | ICD-10-CM | POA: Diagnosis not present

## 2012-10-24 DIAGNOSIS — E785 Hyperlipidemia, unspecified: Secondary | ICD-10-CM | POA: Diagnosis not present

## 2012-10-24 DIAGNOSIS — R9439 Abnormal result of other cardiovascular function study: Secondary | ICD-10-CM | POA: Diagnosis not present

## 2012-10-24 DIAGNOSIS — I248 Other forms of acute ischemic heart disease: Secondary | ICD-10-CM | POA: Diagnosis not present

## 2012-10-24 DIAGNOSIS — R748 Abnormal levels of other serum enzymes: Secondary | ICD-10-CM | POA: Diagnosis not present

## 2012-10-24 DIAGNOSIS — I498 Other specified cardiac arrhythmias: Secondary | ICD-10-CM | POA: Diagnosis not present

## 2012-10-24 DIAGNOSIS — I472 Ventricular tachycardia: Secondary | ICD-10-CM | POA: Diagnosis not present

## 2012-10-24 DIAGNOSIS — R079 Chest pain, unspecified: Secondary | ICD-10-CM | POA: Diagnosis not present

## 2012-10-24 DIAGNOSIS — I471 Supraventricular tachycardia: Secondary | ICD-10-CM | POA: Diagnosis not present

## 2012-10-24 DIAGNOSIS — I10 Essential (primary) hypertension: Secondary | ICD-10-CM | POA: Diagnosis not present

## 2012-10-25 DIAGNOSIS — R9439 Abnormal result of other cardiovascular function study: Secondary | ICD-10-CM | POA: Diagnosis not present

## 2012-10-25 DIAGNOSIS — I10 Essential (primary) hypertension: Secondary | ICD-10-CM | POA: Diagnosis not present

## 2012-10-25 DIAGNOSIS — I498 Other specified cardiac arrhythmias: Secondary | ICD-10-CM | POA: Diagnosis not present

## 2012-10-25 DIAGNOSIS — I472 Ventricular tachycardia: Secondary | ICD-10-CM | POA: Diagnosis not present

## 2012-10-29 ENCOUNTER — Other Ambulatory Visit: Payer: Medicare Other

## 2012-10-30 DIAGNOSIS — I471 Supraventricular tachycardia: Secondary | ICD-10-CM | POA: Diagnosis not present

## 2012-10-30 DIAGNOSIS — Z8249 Family history of ischemic heart disease and other diseases of the circulatory system: Secondary | ICD-10-CM | POA: Diagnosis not present

## 2012-10-30 DIAGNOSIS — R002 Palpitations: Secondary | ICD-10-CM | POA: Diagnosis not present

## 2012-11-04 ENCOUNTER — Other Ambulatory Visit (INDEPENDENT_AMBULATORY_CARE_PROVIDER_SITE_OTHER): Payer: BC Managed Care – PPO

## 2012-11-04 DIAGNOSIS — I359 Nonrheumatic aortic valve disorder, unspecified: Secondary | ICD-10-CM | POA: Diagnosis not present

## 2012-11-04 DIAGNOSIS — I369 Nonrheumatic tricuspid valve disorder, unspecified: Secondary | ICD-10-CM | POA: Diagnosis not present

## 2012-11-04 DIAGNOSIS — I379 Nonrheumatic pulmonary valve disorder, unspecified: Secondary | ICD-10-CM | POA: Diagnosis not present

## 2012-11-04 DIAGNOSIS — I059 Rheumatic mitral valve disease, unspecified: Secondary | ICD-10-CM | POA: Diagnosis not present

## 2012-11-04 DIAGNOSIS — Z125 Encounter for screening for malignant neoplasm of prostate: Secondary | ICD-10-CM | POA: Diagnosis not present

## 2012-11-04 DIAGNOSIS — Z0000 Encounter for general adult medical examination without abnormal findings: Secondary | ICD-10-CM | POA: Diagnosis not present

## 2012-11-04 DIAGNOSIS — E663 Overweight: Secondary | ICD-10-CM | POA: Diagnosis not present

## 2012-11-04 LAB — CBC WITH DIFFERENTIAL/PLATELET
Eosinophils Relative: 4.3 % (ref 0.0–5.0)
HCT: 43.3 % (ref 39.0–52.0)
Lymphs Abs: 1.5 10*3/uL (ref 0.7–4.0)
Monocytes Relative: 9 % (ref 3.0–12.0)
Platelets: 204 10*3/uL (ref 150.0–400.0)
WBC: 5.5 10*3/uL (ref 4.5–10.5)

## 2012-11-04 LAB — POCT URINALYSIS DIPSTICK
Leukocytes, UA: NEGATIVE
Nitrite, UA: NEGATIVE
Protein, UA: NEGATIVE
Urobilinogen, UA: 0.2
pH, UA: 5.5

## 2012-11-04 LAB — BASIC METABOLIC PANEL
CO2: 25 mEq/L (ref 19–32)
Calcium: 8.8 mg/dL (ref 8.4–10.5)
Chloride: 105 mEq/L (ref 96–112)
Glucose, Bld: 104 mg/dL — ABNORMAL HIGH (ref 70–99)
Potassium: 4.3 mEq/L (ref 3.5–5.1)
Sodium: 137 mEq/L (ref 135–145)

## 2012-11-04 LAB — HEPATIC FUNCTION PANEL
ALT: 25 U/L (ref 0–53)
AST: 56 U/L — ABNORMAL HIGH (ref 0–37)
Total Protein: 6.6 g/dL (ref 6.0–8.3)

## 2012-11-04 LAB — LIPID PANEL
HDL: 27.9 mg/dL — ABNORMAL LOW (ref >39.00)
LDL Cholesterol: 115 mg/dL — ABNORMAL HIGH (ref 0–99)
Total CHOL/HDL Ratio: 6
VLDL: 21.6 mg/dL (ref 0.0–40.0)

## 2012-11-05 ENCOUNTER — Encounter: Payer: Medicare Other | Admitting: Family Medicine

## 2012-11-06 DIAGNOSIS — R079 Chest pain, unspecified: Secondary | ICD-10-CM | POA: Diagnosis not present

## 2012-11-06 DIAGNOSIS — I471 Supraventricular tachycardia: Secondary | ICD-10-CM | POA: Diagnosis not present

## 2012-11-11 ENCOUNTER — Encounter: Payer: Self-pay | Admitting: Family Medicine

## 2012-11-11 ENCOUNTER — Ambulatory Visit (INDEPENDENT_AMBULATORY_CARE_PROVIDER_SITE_OTHER): Payer: BC Managed Care – PPO | Admitting: Family Medicine

## 2012-11-11 VITALS — BP 146/92 | HR 72 | Temp 98.1°F | Ht 70.0 in | Wt 236.0 lb

## 2012-11-11 DIAGNOSIS — L258 Unspecified contact dermatitis due to other agents: Secondary | ICD-10-CM

## 2012-11-11 DIAGNOSIS — E663 Overweight: Secondary | ICD-10-CM

## 2012-11-11 DIAGNOSIS — Z0000 Encounter for general adult medical examination without abnormal findings: Secondary | ICD-10-CM

## 2012-11-11 DIAGNOSIS — G43019 Migraine without aura, intractable, without status migrainosus: Secondary | ICD-10-CM

## 2012-11-11 DIAGNOSIS — R1310 Dysphagia, unspecified: Secondary | ICD-10-CM

## 2012-11-11 DIAGNOSIS — M549 Dorsalgia, unspecified: Secondary | ICD-10-CM

## 2012-11-11 DIAGNOSIS — H906 Mixed conductive and sensorineural hearing loss, bilateral: Secondary | ICD-10-CM

## 2012-11-11 DIAGNOSIS — I471 Supraventricular tachycardia, unspecified: Secondary | ICD-10-CM | POA: Insufficient documentation

## 2012-11-11 DIAGNOSIS — J309 Allergic rhinitis, unspecified: Secondary | ICD-10-CM

## 2012-11-11 MED ORDER — CLOBETASOL PROPIONATE 0.05 % EX OINT: TOPICAL_OINTMENT | Freq: Every day | CUTANEOUS | Status: DC

## 2012-11-11 MED ORDER — AMLODIPINE BESYLATE 5 MG PO TABS: 5.0000 mg | ORAL_TABLET | Freq: Every day | ORAL | Status: DC

## 2012-11-11 NOTE — Patient Instructions (Signed)
Continue the Norvasc 5 mg daily  Decrease your caffeine consumption to one cup daily  Did not consume any other sources of caffeine  Return in 4 weeks for blood pressure check sooner if any problem

## 2012-11-11 NOTE — Progress Notes (Signed)
  Subjective:    Patient ID: Daniel Guerrero, male    DOB: May 30, 1946, 67 y.o.   MRN: [id number]  HPI [name] is 67 year old married male nonsmoker who comes in today for a Medicare wellness examination  He recent had an episode of SVT. He was seen in a hospital in [place]. He's had these episodes in the past and typically they go away and then 1520 minutes if he just sits and rests. This time the episode did not go away he went emergency room was given medication subsequent had a cardiac evaluation which included an echocardiogram and a stress test both of which were normal. Enzymes were negative he was discharged on Norvasc 5 mg daily.  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   HENT: Negative.   Eyes: Negative.   Respiratory: Negative.   Cardiovascular: Negative.   Gastrointestinal: Negative.   Genitourinary: Negative.   Musculoskeletal: Negative.   Skin: Negative.   Neurological: Negative.   Psychiatric/Behavioral: Negative.        Objective:   Physical Exam  Constitutional: He is oriented to person, place, and time. He appears well-developed and well-nourished.  HENT:  Head: Normocephalic and atraumatic.  Right Ear: External ear normal.  Left Ear: External ear normal.  Nose: Nose normal.  Mouth/Throat: Oropharynx is clear and moist.  Eyes: Conjunctivae and EOM are normal. Pupils are equal, round, and reactive to light.  Neck: Normal range of motion. Neck supple. No JVD present. No tracheal deviation present. No thyromegaly present.  Cardiovascular: Normal rate, regular rhythm, normal heart sounds and intact distal pulses.  Exam reveals no gallop  and no friction rub.   No murmur heard. Pulmonary/Chest: Effort normal and breath sounds normal. No stridor. No respiratory distress. He has no wheezes. He has no rales. He exhibits no tenderness.  Abdominal: Soft. Bowel sounds are normal. He exhibits no distension and no mass. There is no tenderness. There is no rebound and no guarding.  Genitourinary: Rectum normal, prostate normal and penis normal. Guaiac negative stool. No penile tenderness.  2+ symmetrical BPH  Musculoskeletal: Normal range of motion. He exhibits no edema and no tenderness.  Lymphadenopathy:    He has no cervical adenopathy.  Neurological: He is alert and oriented to person, place, and time. He has normal reflexes. No cranial nerve deficit. He exhibits normal muscle tone.  Skin: Skin is warm and dry. No rash noted. No erythema. No pallor.  Psychiatric: He has a normal mood and affect. His behavior is normal. Judgment and thought content normal.          Assessment & Plan:  He ishealthy male  History of recurrent SVT,,,,,,,,,,,,, decrease caffeine consumption from 4 cups a day to 1  Hypertension continue Norvasc 5 mg daily  Eczema continue above trains  Return in 4 weeks for blood pressure followup  Overweight again we discussed diet exercise and weight loss

## 2012-11-25 DIAGNOSIS — I472 Ventricular tachycardia: Secondary | ICD-10-CM | POA: Diagnosis not present

## 2012-12-09 ENCOUNTER — Ambulatory Visit: Payer: Medicare Other | Admitting: Family Medicine

## 2012-12-17 ENCOUNTER — Encounter: Payer: Self-pay | Admitting: Family Medicine

## 2012-12-23 ENCOUNTER — Encounter: Payer: Self-pay | Admitting: Family Medicine

## 2012-12-25 DIAGNOSIS — H43399 Other vitreous opacities, unspecified eye: Secondary | ICD-10-CM | POA: Diagnosis not present

## 2012-12-25 DIAGNOSIS — Z961 Presence of intraocular lens: Secondary | ICD-10-CM | POA: Diagnosis not present

## 2013-02-07 ENCOUNTER — Telehealth: Payer: Self-pay | Admitting: Family Medicine

## 2013-02-07 NOTE — Telephone Encounter (Signed)
Pt's wife called to get pt a referral to heart MD. She states pt has had a couple of days of "SAT" again. pls advise.

## 2013-03-25 DIAGNOSIS — T1500XA Foreign body in cornea, unspecified eye, initial encounter: Secondary | ICD-10-CM | POA: Diagnosis not present

## 2013-03-25 DIAGNOSIS — H20039 Secondary infectious iridocyclitis, unspecified eye: Secondary | ICD-10-CM | POA: Diagnosis not present

## 2013-05-02 DIAGNOSIS — I471 Supraventricular tachycardia: Secondary | ICD-10-CM | POA: Diagnosis not present

## 2013-05-02 DIAGNOSIS — R002 Palpitations: Secondary | ICD-10-CM | POA: Diagnosis not present

## 2013-05-02 DIAGNOSIS — Z8249 Family history of ischemic heart disease and other diseases of the circulatory system: Secondary | ICD-10-CM | POA: Diagnosis not present

## 2013-05-02 DIAGNOSIS — Z23 Encounter for immunization: Secondary | ICD-10-CM | POA: Diagnosis not present

## 2013-05-08 DIAGNOSIS — I471 Supraventricular tachycardia: Secondary | ICD-10-CM | POA: Diagnosis not present

## 2013-06-12 ENCOUNTER — Other Ambulatory Visit: Payer: Self-pay | Admitting: Internal Medicine

## 2013-07-24 HISTORY — PX: CARDIAC ELECTROPHYSIOLOGY MAPPING AND ABLATION: SHX1292

## 2013-07-28 ENCOUNTER — Encounter: Payer: Self-pay | Admitting: Internal Medicine

## 2013-07-28 ENCOUNTER — Ambulatory Visit (INDEPENDENT_AMBULATORY_CARE_PROVIDER_SITE_OTHER): Payer: BC Managed Care – PPO | Admitting: Internal Medicine

## 2013-07-28 VITALS — BP 158/90 | HR 77 | Temp 97.9°F | Resp 20 | Wt 231.0 lb

## 2013-07-28 DIAGNOSIS — J329 Chronic sinusitis, unspecified: Secondary | ICD-10-CM | POA: Diagnosis not present

## 2013-07-28 DIAGNOSIS — J309 Allergic rhinitis, unspecified: Secondary | ICD-10-CM | POA: Diagnosis not present

## 2013-07-28 DIAGNOSIS — I471 Supraventricular tachycardia: Secondary | ICD-10-CM | POA: Diagnosis not present

## 2013-07-28 MED ORDER — AZITHROMYCIN 250 MG PO TABS: ORAL_TABLET | ORAL | Status: DC

## 2013-07-28 NOTE — Progress Notes (Signed)
Subjective:    Patient ID: Daniel Guerrero, male    DOB: 1946/01/23, 68 y.o.   MRN: [id number]  HPI  68 year old patient who states that he has been ill for 13 days. He's had cough sinus congestion and pressure and more recently has been expectorating green and at times bloody drainage. He has developed more sinus pain and pressure. He states that he has had some intermittent fever and sweats. No chills. He has been using Zyrtec and Mucinex as well as some saline irrigation. He does have a history of PSVT and has been using atenolol 25 mg on a when necessary basis. He avoids decongestants  Past Medical History  Diagnosis Date  . Diabetes mellitus     type II  . Hypertension   . Eczema   . Psoriasis   . Hearing loss     high frequency  . Cataract of both eyes   . Erectile dysfunction   . Obesity   . S/P appendectomy   . History of inguinal hernia repair, bilateral   . S/P tonsillectomy   . Loss of hearing     high frequency  . Paroxysmal SVT (supraventricular tachycardia)     History   Social History  . Marital Status: Married    Spouse Name: N/A    Number of Children: N/A  . Years of Education: N/A   Occupational History  . Not on file.   Social History Main Topics  . Smoking status: Former Research scientist (life sciences)  . Smokeless tobacco: Not on file     Comment: Quit 20-30 years ago  . Alcohol Use: 1.8 oz/week    1 Glasses of wine, 1 Cans of beer, 1 Shots of liquor per week     Comment: occasional  . Drug Use: No  . Sexual Activity: Not on file   Other Topics Concern  . Not on file   Social History Narrative   Daily Caffeine use: 5-6 drinks daily   Exercising 2 times per week   No Drug use             Past Surgical History  Procedure Laterality Date  . Appendectomy    . Hernia repair      right and left  . Tonsillectomy      Family History  Problem Relation Age of Onset  . Cancer Mother     lung  . Diabetes Father   . Thyroid disease Mother   . Lung cancer  Mother   . Colon cancer Neg Hx     Allergies  Allergen Reactions  . Peanut-Containing Drug Products   . Penicillins Hives    Current Outpatient Prescriptions on File Prior to Visit  Medication Sig Dispense Refill  . aspirin EC 81 MG tablet Take 81 mg by mouth daily.      . Cholecalciferol (VITAMIN D PO) Take 1 tablet by mouth daily.      . clobetasol ointment (TEMOVATE) 0.05 % Apply topically daily. Apply each bedtime  60 g  3  . Cyanocobalamin (VITAMIN B-12 PO) Take 1 tablet by mouth daily.      Marland Kitchen desonide (DESOWEN) 0.05 % cream Apply topically 2 (two) times daily. Twice a month for skin rash  60 g  3  . triamcinolone ointment (KENALOG) 0.1 % Apply topically 2 (two) times daily. Apply thin layer  80 g  3  . vitamin C (ASCORBIC ACID) 500 MG tablet Take 500 mg by mouth daily.      Marland Kitchen  amLODipine (NORVASC) 5 MG tablet Take 1 tablet (5 mg total) by mouth daily.  100 tablet  3   No current facility-administered medications on file prior to visit.    BP 158/90  Pulse 77  Temp(Src) 97.9 F (36.6 C) (Oral)  Resp 20  Wt 231 lb (104.781 kg)  SpO2 98%       Review of Systems  Constitutional: Positive for fever and fatigue. Negative for chills and appetite change.  HENT: Positive for congestion, postnasal drip, rhinorrhea and sinus pressure. Negative for dental problem, ear pain, hearing loss, sore throat, tinnitus, trouble swallowing and voice change.   Eyes: Negative for pain, discharge and visual disturbance.  Respiratory: Positive for cough. Negative for chest tightness, wheezing and stridor.   Cardiovascular: Negative for chest pain, palpitations and leg swelling.  Gastrointestinal: Negative for nausea, vomiting, abdominal pain, diarrhea, constipation, blood in stool and abdominal distention.  Genitourinary: Negative for urgency, hematuria, flank pain, discharge, difficulty urinating and genital sores.  Musculoskeletal: Negative for arthralgias, back pain, gait problem, joint  swelling, myalgias and neck stiffness.  Skin: Negative for rash.  Neurological: Positive for headaches. Negative for dizziness, syncope, speech difficulty, weakness and numbness.  Hematological: Negative for adenopathy. Does not bruise/bleed easily.  Psychiatric/Behavioral: Negative for behavioral problems and dysphoric mood. The patient is not nervous/anxious.        Objective:   Physical Exam  Constitutional: He is oriented to person, place, and time. He appears well-developed.  HENT:  Head: Normocephalic.  Right Ear: External ear normal.  Left Ear: External ear normal.  Oropharynx erythematous Hearing aids in place bilaterally  Mild maxillary sinus tenderness  Eyes: Conjunctivae and EOM are normal.  Neck: Normal range of motion.  Cardiovascular: Normal rate and normal heart sounds.   Pulmonary/Chest: Breath sounds normal.  Abdominal: Bowel sounds are normal.  Musculoskeletal: Normal range of motion. He exhibits no edema and no tenderness.  Neurological: He is alert and oriented to person, place, and time.  Psychiatric: He has a normal mood and affect. His behavior is normal.          Assessment & Plan:   URI with early maxillary sinusitis. In view of his penicillin allergy we'll treat with azithromycin. We'll continue expectorants and saline irrigation

## 2013-07-28 NOTE — Progress Notes (Signed)
Pre-visit discussion using our clinic review tool. No additional management support is needed unless otherwise documented below in the visit note.  

## 2013-07-28 NOTE — Patient Instructions (Signed)
    Use saline irrigation, warm  moist compresses and over-the-counter decongestants only as directed.  Call if there is no improvement in 5 to 7 days, or sooner if you develop increasing pain, fever, or any new symptoms.  Take your antibiotic as prescribed until ALL of it is gone, but stop if you develop a rash, swelling, or any side effects of the medication.  Contact our office as soon as possible if  there are side effects of the medication. 

## 2013-09-24 DIAGNOSIS — I471 Supraventricular tachycardia: Secondary | ICD-10-CM | POA: Diagnosis not present

## 2013-09-24 DIAGNOSIS — I10 Essential (primary) hypertension: Secondary | ICD-10-CM | POA: Diagnosis not present

## 2013-11-11 ENCOUNTER — Other Ambulatory Visit: Payer: Self-pay | Admitting: Family Medicine

## 2013-11-14 ENCOUNTER — Other Ambulatory Visit (INDEPENDENT_AMBULATORY_CARE_PROVIDER_SITE_OTHER): Payer: Medicare Other

## 2013-11-14 DIAGNOSIS — Z125 Encounter for screening for malignant neoplasm of prostate: Secondary | ICD-10-CM

## 2013-11-14 DIAGNOSIS — I10 Essential (primary) hypertension: Secondary | ICD-10-CM

## 2013-11-14 DIAGNOSIS — Z0000 Encounter for general adult medical examination without abnormal findings: Secondary | ICD-10-CM | POA: Diagnosis not present

## 2013-11-14 DIAGNOSIS — E119 Type 2 diabetes mellitus without complications: Secondary | ICD-10-CM

## 2013-11-14 LAB — POCT URINALYSIS DIPSTICK
Bilirubin, UA: NEGATIVE
Blood, UA: NEGATIVE
Glucose, UA: NEGATIVE
Ketones, UA: NEGATIVE
LEUKOCYTES UA: NEGATIVE
NITRITE UA: NEGATIVE
PH UA: 5.5
PROTEIN UA: NEGATIVE
Spec Grav, UA: 1.025
Urobilinogen, UA: 0.2

## 2013-11-14 LAB — CBC WITH DIFFERENTIAL/PLATELET
BASOS PCT: 0.5 % (ref 0.0–3.0)
Basophils Absolute: 0 10*3/uL (ref 0.0–0.1)
EOS ABS: 0.3 10*3/uL (ref 0.0–0.7)
Eosinophils Relative: 6.2 % — ABNORMAL HIGH (ref 0.0–5.0)
HCT: 43.1 % (ref 39.0–52.0)
Hemoglobin: 14.8 g/dL (ref 13.0–17.0)
Lymphocytes Relative: 26.1 % (ref 12.0–46.0)
Lymphs Abs: 1.3 10*3/uL (ref 0.7–4.0)
MCHC: 34.3 g/dL (ref 30.0–36.0)
MCV: 94 fl (ref 78.0–100.0)
MONO ABS: 0.6 10*3/uL (ref 0.1–1.0)
Monocytes Relative: 11.2 % (ref 3.0–12.0)
NEUTROS ABS: 2.9 10*3/uL (ref 1.4–7.7)
NEUTROS PCT: 56 % (ref 43.0–77.0)
Platelets: 152 10*3/uL (ref 150.0–400.0)
RBC: 4.59 Mil/uL (ref 4.22–5.81)
RDW: 13.4 % (ref 11.5–14.6)
WBC: 5.1 10*3/uL (ref 4.5–10.5)

## 2013-11-14 LAB — LIPID PANEL
Cholesterol: 169 mg/dL (ref 0–200)
HDL: 30.9 mg/dL — ABNORMAL LOW (ref >39.00)
LDL Cholesterol: 86 mg/dL (ref 0–99)
Total CHOL/HDL Ratio: 5
Triglycerides: 260 mg/dL — ABNORMAL HIGH (ref 0.0–149.0)
VLDL: 52 mg/dL — ABNORMAL HIGH (ref 0.0–40.0)

## 2013-11-14 LAB — HEPATIC FUNCTION PANEL
ALBUMIN: 3.9 g/dL (ref 3.5–5.2)
ALT: 27 U/L (ref 0–53)
AST: 57 U/L — ABNORMAL HIGH (ref 0–37)
Alkaline Phosphatase: 67 U/L (ref 39–117)
BILIRUBIN DIRECT: 0.1 mg/dL (ref 0.0–0.3)
TOTAL PROTEIN: 6.2 g/dL (ref 6.0–8.3)
Total Bilirubin: 0.8 mg/dL (ref 0.3–1.2)

## 2013-11-14 LAB — BASIC METABOLIC PANEL
BUN: 16 mg/dL (ref 6–23)
CHLORIDE: 109 meq/L (ref 96–112)
CO2: 27 meq/L (ref 19–32)
CREATININE: 0.8 mg/dL (ref 0.4–1.5)
Calcium: 8.9 mg/dL (ref 8.4–10.5)
GFR: 99.33 mL/min (ref >60.00)
GLUCOSE: 118 mg/dL — AB (ref 70–99)
Potassium: 4.8 mEq/L (ref 3.5–5.1)
Sodium: 141 mEq/L (ref 135–145)

## 2013-11-14 LAB — PSA: PSA: 2.43 ng/mL (ref 0.10–4.00)

## 2013-11-14 LAB — TSH: TSH: 1.49 u[IU]/mL (ref 0.35–5.50)

## 2013-11-20 ENCOUNTER — Encounter: Payer: BC Managed Care – PPO | Admitting: Family Medicine

## 2013-12-18 ENCOUNTER — Ambulatory Visit (INDEPENDENT_AMBULATORY_CARE_PROVIDER_SITE_OTHER): Payer: Medicare Other | Admitting: Family Medicine

## 2013-12-18 ENCOUNTER — Encounter: Payer: Self-pay | Admitting: Family Medicine

## 2013-12-18 VITALS — BP 120/84 | Temp 97.7°F | Ht 69.5 in | Wt 234.0 lb

## 2013-12-18 DIAGNOSIS — L258 Unspecified contact dermatitis due to other agents: Secondary | ICD-10-CM | POA: Diagnosis not present

## 2013-12-18 DIAGNOSIS — I471 Supraventricular tachycardia: Secondary | ICD-10-CM

## 2013-12-18 DIAGNOSIS — Z0000 Encounter for general adult medical examination without abnormal findings: Secondary | ICD-10-CM

## 2013-12-18 DIAGNOSIS — E663 Overweight: Secondary | ICD-10-CM | POA: Diagnosis not present

## 2013-12-18 DIAGNOSIS — H906 Mixed conductive and sensorineural hearing loss, bilateral: Secondary | ICD-10-CM

## 2013-12-18 DIAGNOSIS — J309 Allergic rhinitis, unspecified: Secondary | ICD-10-CM

## 2013-12-18 DIAGNOSIS — N529 Male erectile dysfunction, unspecified: Secondary | ICD-10-CM

## 2013-12-18 DIAGNOSIS — N401 Enlarged prostate with lower urinary tract symptoms: Secondary | ICD-10-CM

## 2013-12-18 DIAGNOSIS — Z23 Encounter for immunization: Secondary | ICD-10-CM | POA: Diagnosis not present

## 2013-12-18 DIAGNOSIS — I10 Essential (primary) hypertension: Secondary | ICD-10-CM

## 2013-12-18 DIAGNOSIS — L408 Other psoriasis: Secondary | ICD-10-CM

## 2013-12-18 DIAGNOSIS — R351 Nocturia: Secondary | ICD-10-CM

## 2013-12-18 MED ORDER — SILDENAFIL CITRATE 50 MG PO TABS: 50.0000 mg | ORAL_TABLET | ORAL | Status: DC | PRN

## 2013-12-18 MED ORDER — ATENOLOL 25 MG PO TABS: ORAL_TABLET | ORAL | Status: DC

## 2013-12-18 MED ORDER — TRIAMCINOLONE ACETONIDE 0.1 % EX OINT: TOPICAL_OINTMENT | Freq: Two times a day (BID) | CUTANEOUS | Status: DC

## 2013-12-18 MED ORDER — DOXAZOSIN MESYLATE 1 MG PO TABS: ORAL_TABLET | ORAL | Status: DC

## 2013-12-18 MED ORDER — AMLODIPINE BESYLATE 5 MG PO TABS: ORAL_TABLET | ORAL | Status: DC

## 2013-12-18 MED ORDER — DESONIDE 0.05 % EX CREA: TOPICAL_CREAM | Freq: Two times a day (BID) | CUTANEOUS | Status: DC

## 2013-12-18 MED ORDER — CLOBETASOL PROPIONATE 0.05 % EX OINT: TOPICAL_OINTMENT | Freq: Every day | CUTANEOUS | Status: DC

## 2013-12-18 NOTE — Progress Notes (Signed)
Subjective:    Patient ID: Daniel Guerrero, male    DOB: May 01, 1946, 68 y.o.   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 DT.  He was admitted to a didn't go Hospital where he teaches/here for SVT cardiac workup was negative. It can walk 25 mg was added to his treatment program. However you to stop the atenolol causes blood pressure dropped too low. His cardiologist in [place] has told him to take it when necessary if he has another bout of SVT  He gets routine eye care, dental care, colonoscopy due in GI he'll call Dr. His gastroenterologist  Vaccinations updated he is due to shingles vaccine  Cognitive function normal he does not exercise on a regular basis weight is 234 pounds height 69-1/2 inches, home health safety reviewed no issues identified, no guns in the house, he does have a health care power of attorney and living well  In the past she's had glucose intolerance because of his weight.   Review of Systems  Constitutional: Negative.   HENT: Negative.   Eyes: Negative.   Respiratory: Negative.   Cardiovascular: Negative.   Gastrointestinal: Negative.   Genitourinary: Negative.   Musculoskeletal: Negative.   Skin: Negative.   Neurological: Negative.   Psychiatric/Behavioral: Negative.        Objective:   Physical Exam  Nursing note and vitals reviewed. Constitutional: He is oriented to person, place, and time. He appears well-developed and well-nourished.  HENT:  Head: Normocephalic and atraumatic.  Right Ear: External ear normal.  Left Ear: External ear normal.  Nose: Nose normal.  Mouth/Throat: Oropharynx is clear and moist.  Eyes: Conjunctivae and EOM are normal. Pupils are equal, round, and reactive to light.  Neck: Normal range of motion.  Neck supple. No JVD present. No tracheal deviation present. No thyromegaly present.  Cardiovascular: Normal rate, regular rhythm, normal heart sounds and intact distal pulses.  Exam reveals no gallop and no friction rub.   No murmur heard. No carotid nor aortic bruits peripheral pulses 2+ and symmetrical  Pulmonary/Chest: Effort normal and breath sounds normal. No stridor. No respiratory distress. He has no wheezes. He has no rales. He exhibits no tenderness.  Abdominal: Soft. Bowel sounds are normal. He exhibits no distension and no mass. There is no tenderness. There is no rebound and no guarding.  Large panniculus bruise from trauma  Genitourinary: Rectum normal and penis normal. Guaiac negative stool. No penile tenderness.  2+ symmetrical BPH  Musculoskeletal: Normal range of motion. He exhibits no edema and no tenderness.  Lymphadenopathy:    He has no cervical adenopathy.  Neurological: He is alert and oriented to person, place, and time. He has normal reflexes. No cranial nerve deficit. He exhibits normal muscle tone.  Skin: Skin is warm and dry. No rash noted. No erythema. No pallor.  Psychiatric: He has a normal mood and affect. His behavior is normal. Judgment and thought content normal.          Assessment & Plan:  Healthy male  Hypertension continue Norvasc 5 mg daily  Allergic rhinitis Zyrtec each bedtime  Multiple types of skin rashes steroid creams refilled originally from dermatology  BPH with urinary tract symptoms begin Cardura 1 mg at bedtime  History of SVT and atenolol 25 mg one half tab  every 6 takes when necessary  Erectile dysfunction Viagra 50 mg,,,,,,,,,,,, trial of one half tab when necessary  Overweight,,,,,,,,,,,, diet exercise and weight loss,

## 2013-12-18 NOTE — Patient Instructions (Signed)
Cardura 1 mg. One tablet at bedtime for BPH.......... if in 3 months you don't see much improvement call and we will increase the dose  Viagra 50 mg ............... one half tab when necessary .................. [email or web address]  8 atenolol ............ 25 mg ........ one half tab every 6 hours when necessary for SVT  DC caffeine  Walk 30 minutes daily  Return in one year sooner if any problems ..........Marland Kitchen

## 2013-12-19 ENCOUNTER — Telehealth: Payer: Self-pay | Admitting: Family Medicine

## 2013-12-19 NOTE — Telephone Encounter (Signed)
Relevant patient education assigned to patient using Emmi. ° °

## 2014-01-15 ENCOUNTER — Other Ambulatory Visit (INDEPENDENT_AMBULATORY_CARE_PROVIDER_SITE_OTHER): Payer: BC Managed Care – PPO

## 2014-01-15 ENCOUNTER — Telehealth: Payer: Self-pay | Admitting: Family Medicine

## 2014-01-15 ENCOUNTER — Encounter: Payer: Self-pay | Admitting: Internal Medicine

## 2014-01-15 ENCOUNTER — Ambulatory Visit (INDEPENDENT_AMBULATORY_CARE_PROVIDER_SITE_OTHER): Payer: BC Managed Care – PPO | Admitting: Internal Medicine

## 2014-01-15 VITALS — BP 120/72 | HR 73 | Temp 98.2°F | Ht 68.0 in | Wt 227.1 lb

## 2014-01-15 DIAGNOSIS — R1032 Left lower quadrant pain: Secondary | ICD-10-CM

## 2014-01-15 DIAGNOSIS — IMO0001 Reserved for inherently not codable concepts without codable children: Secondary | ICD-10-CM | POA: Insufficient documentation

## 2014-01-15 DIAGNOSIS — Z8719 Personal history of other diseases of the digestive system: Secondary | ICD-10-CM | POA: Insufficient documentation

## 2014-01-15 DIAGNOSIS — N529 Male erectile dysfunction, unspecified: Secondary | ICD-10-CM | POA: Insufficient documentation

## 2014-01-15 DIAGNOSIS — I10 Essential (primary) hypertension: Secondary | ICD-10-CM | POA: Diagnosis not present

## 2014-01-15 DIAGNOSIS — Z9889 Other specified postprocedural states: Secondary | ICD-10-CM

## 2014-01-15 DIAGNOSIS — E1165 Type 2 diabetes mellitus with hyperglycemia: Secondary | ICD-10-CM

## 2014-01-15 DIAGNOSIS — L309 Dermatitis, unspecified: Secondary | ICD-10-CM | POA: Insufficient documentation

## 2014-01-15 LAB — BASIC METABOLIC PANEL
BUN: 14 mg/dL (ref 6–23)
CO2: 24 meq/L (ref 19–32)
Calcium: 9 mg/dL (ref 8.4–10.5)
Chloride: 102 mEq/L (ref 96–112)
Creatinine, Ser: 1 mg/dL (ref 0.4–1.5)
GFR: 80.82 mL/min (ref >60.00)
GLUCOSE: 100 mg/dL — AB (ref 70–99)
POTASSIUM: 4 meq/L (ref 3.5–5.1)
SODIUM: 137 meq/L (ref 135–145)

## 2014-01-15 LAB — URINALYSIS, ROUTINE W REFLEX MICROSCOPIC
HGB URINE DIPSTICK: NEGATIVE
Ketones, ur: 15 — AB
LEUKOCYTES UA: NEGATIVE
NITRITE: NEGATIVE
PH: 6 (ref 5.0–8.0)
Specific Gravity, Urine: 1.025 (ref 1.000–1.030)
UROBILINOGEN UA: 0.2 (ref 0.0–1.0)
Urine Glucose: NEGATIVE

## 2014-01-15 LAB — CBC WITH DIFFERENTIAL/PLATELET
BASOS ABS: 0.1 10*3/uL (ref 0.0–0.1)
Basophils Relative: 1 % (ref 0.0–3.0)
Eosinophils Absolute: 0.2 10*3/uL (ref 0.0–0.7)
Eosinophils Relative: 1.9 % (ref 0.0–5.0)
HEMATOCRIT: 43.6 % (ref 39.0–52.0)
Hemoglobin: 15.1 g/dL (ref 13.0–17.0)
LYMPHS ABS: 1.8 10*3/uL (ref 0.7–4.0)
LYMPHS PCT: 16.2 % (ref 12.0–46.0)
MCHC: 34.7 g/dL (ref 30.0–36.0)
MCV: 94.1 fl (ref 78.0–100.0)
MONOS PCT: 9.7 % (ref 3.0–12.0)
Monocytes Absolute: 1.1 10*3/uL — ABNORMAL HIGH (ref 0.1–1.0)
Neutro Abs: 8.1 10*3/uL — ABNORMAL HIGH (ref 1.4–7.7)
Neutrophils Relative %: 71.2 % (ref 43.0–77.0)
PLATELETS: 214 10*3/uL (ref 150.0–400.0)
RBC: 4.64 Mil/uL (ref 4.22–5.81)
RDW: 13.1 % (ref 11.5–15.5)
WBC: 11.4 10*3/uL — ABNORMAL HIGH (ref 4.0–10.5)

## 2014-01-15 LAB — HEPATIC FUNCTION PANEL
ALBUMIN: 4.3 g/dL (ref 3.5–5.2)
ALK PHOS: 70 U/L (ref 39–117)
ALT: 18 U/L (ref 0–53)
AST: 51 U/L — AB (ref 0–37)
Bilirubin, Direct: 0.4 mg/dL — ABNORMAL HIGH (ref 0.0–0.3)
TOTAL PROTEIN: 7.9 g/dL (ref 6.0–8.3)
Total Bilirubin: 3 mg/dL — ABNORMAL HIGH (ref 0.2–1.2)

## 2014-01-15 MED ORDER — LEVOFLOXACIN 250 MG PO TABS: 250.0000 mg | ORAL_TABLET | Freq: Every day | ORAL | Status: DC

## 2014-01-15 MED ORDER — METRONIDAZOLE 250 MG PO TABS: 250.0000 mg | ORAL_TABLET | Freq: Three times a day (TID) | ORAL | Status: DC

## 2014-01-15 NOTE — Assessment & Plan Note (Signed)
stable overall by history and exam, recent data reviewed with pt, and pt to continue medical treatment as before,  to f/u any worsening symptoms or concerns BP Readings from Last 3 Encounters:  01/15/14 120/72  12/18/13 120/84  07/28/13 158/90

## 2014-01-15 NOTE — Patient Instructions (Signed)
Please take all new medication as prescribed - the two antibiotics  Please continue all other medications as before, and refills have been done if requested.  Please have the pharmacy call with any other refills you may need.  Please keep your appointments with your specialists as you may have planned  Please go to the LAB in the Basement (turn left off the elevator) for the tests to be done today  You will be contacted by phone if any changes need to be made immediately.  Otherwise, you will receive a letter about your results with an explanation, but please check with MyChart first.  Please remember to sign up for MyChart if you have not done so, as this will be important to you in the future with finding out test results, communicating by private email, and scheduling acute appointments online when needed.  Please see Dr Sherren Mocha in followup in 3-5 days

## 2014-01-15 NOTE — Assessment & Plan Note (Signed)
To help assess with today labs, f/u onset polys or cbg > 200

## 2014-01-15 NOTE — Telephone Encounter (Signed)
Patient Information:  Caller Name: Eames  Phone: [phone]  Patient: Daniel Guerrero, Daniel Guerrero  Gender: Male  DOB: 06-22-1946  Age: 68 Years  PCP: Stevie Kern [facility])  Office Follow Up:  Does the office need to follow up with this patient?: No  Instructions For The Office: N/A   Symptoms  Reason For Call & Symptoms: Patient calling about left lower quadrant abdominal pain and low grade fever.  Onset pain 01/14/14.  He woke feeling well, but pain and fever returned. Currently states no fever.  Pain rated at 2-3 of 10. Burning with urination and frequency reported.  Emergent symptoms ruled out.  See Today in Office per Abdominal Pain - Male guideline due to Age > 60 years.  Reviewed Health History In EMR: Yes  Reviewed Medications In EMR: Yes  Reviewed Allergies In EMR: Yes  Reviewed Surgeries / Procedures: Yes  Date of Onset of Symptoms: 01/14/2014  Treatments Tried: Tylenol for fever with relief  Treatments Tried Worked: Yes  Guideline(s) Used:  Abdominal Pain - Male  Disposition Per Guideline:   See Today in Office  Reason For Disposition Reached:   Age > 60 years  Advice Given:  Pass A BM:  Sit on the toilet and try to pass a bowel movement (BM). Do not strain. This may relieve pain if it is due to constipation or impending diarrhea.  Expected Course:  With harmless causes, the pain is usually better or goes away within 2 hours. With viral gastroenteritis ("stomach flu"), belly cramps may precede each bout of vomiting or diarrhea and may last 2-3 days. With serious causes (such as appendicitis) the pain becomes constant and more severe.  Call Back If:  Abdominal pains come and go and are present for more than 24 hours  You become worse.  Patient Will Follow Care Advice:  YES  Appointment Scheduled:  01/15/2014 16:30:00 Appointment Scheduled Provider:  Cathlean Cower

## 2014-01-15 NOTE — Telephone Encounter (Signed)
Noted  

## 2014-01-15 NOTE — Assessment & Plan Note (Signed)
?   uti vs diverticulitis vs other - non toxic appearing but warm, with mild tender LLQ - for lab/urine studies,  Blood cx, empiric levaquin/flagyl, f/u with PCP in 3-5 days, will hold on imaging at this time but would consider CT for any worsening, and likely should have colonoscopy soon as last was approx 10 yrs and he is due at least for screening purpose

## 2014-01-15 NOTE — Progress Notes (Signed)
Pre visit review using our clinic review tool, if applicable. No additional management support is needed unless otherwise documented below in the visit note. 

## 2014-01-15 NOTE — Progress Notes (Signed)
Subjective:    Patient ID: Daniel Guerrero, male    DOB: April 14, 1946, 68 y.o.   MRN: [id number]  HPI  Here with wife, with acute onset 2 days LLQ/left inguinal area pain without swelling, rash, ulceration or mass, but with feverish, chills and shakes last evening and again this am, also with nocturia x 4 last pm - very unusual for him but Denies urinary symptoms such as dysuria, frequency, urgency, flank pain, hematuria or n/v.  Has hx of UTi, but no renal stone, malignancy, or divertiulitis.  Last colonoscopy approx 10 yrs.  S/p bilat ing hernia repair Past Medical History  Diagnosis Date  . Diabetes mellitus     type II  . Hypertension   . Eczema   . Psoriasis   . Hearing loss     high frequency  . Cataract of both eyes   . Erectile dysfunction   . Obesity   . S/P appendectomy   . History of inguinal hernia repair, bilateral   . S/P tonsillectomy   . Loss of hearing     high frequency  . Paroxysmal SVT (supraventricular tachycardia)    Past Surgical History  Procedure Laterality Date  . Appendectomy    . Hernia repair      right and left  . Tonsillectomy      reports that he has quit smoking. He does not have any smokeless tobacco history on file. He reports that he drinks about 1.8 ounces of alcohol per week. He reports that he does not use illicit drugs. family history includes Cancer in his mother; Diabetes in his father; Lung cancer in his mother; Thyroid disease in his mother. There is no history of Colon cancer. Allergies  Allergen Reactions  . Peanut-Containing Drug Products   . Penicillins Hives   Current Outpatient Prescriptions on File Prior to Visit  Medication Sig Dispense Refill  . amLODipine (NORVASC) 5 MG tablet TAKE 1 TABLET BY MOUTH DAILY.  100 tablet  3  . aspirin EC 81 MG tablet Take 81 mg by mouth daily.      Marland Kitchen atenolol (TENORMIN) 25 MG tablet One half tab every 6 hours when necessary for SVT  50 tablet  1  . cetirizine (ZYRTEC) 10 MG tablet Take 10  mg by mouth daily.      . Cholecalciferol (VITAMIN D PO) Take 1 tablet by mouth daily.      . clobetasol ointment (TEMOVATE) 0.05 % Apply topically daily. Apply each bedtime  60 g  3  . Cyanocobalamin (VITAMIN B-12 PO) Take 1 tablet by mouth daily.      Marland Kitchen desonide (DESOWEN) 0.05 % cream Apply topically 2 (two) times daily. Twice a month for skin rash  60 g  3  . doxazosin (CARDURA) 1 MG tablet 1 by mouth each bedtime for BPH  100 tablet  3  . guaiFENesin (MUCINEX) 600 MG 12 hr tablet Take 600 mg by mouth 2 (two) times daily.      . sildenafil (VIAGRA) 50 MG tablet Take 1 tablet (50 mg total) by mouth as needed for erectile dysfunction.  10 tablet  11  . triamcinolone ointment (KENALOG) 0.1 % Apply topically 2 (two) times daily. Apply thin layer  80 g  3  . vitamin C (ASCORBIC ACID) 500 MG tablet Take 500 mg by mouth daily.       No current facility-administered medications on file prior to visit.   Review of Systems   Constitutional: Negative for  unusual diaphoresis or other sweats  HENT: Negative for ringing in ear Eyes: Negative for double vision or worsening visual disturbance.  Respiratory: Negative for choking and stridor.   Gastrointestinal: Negative for vomiting or other signifcant bowel change Genitourinary: Negative for hematuria or decreased urine volume.  Musculoskeletal: Negative for other MSK pain or swelling Skin: Negative for color change and worsening wound.  Neurological: Negative for tremors and numbness other than noted  Psychiatric/Behavioral: Negative for decreased concentration or agitation other than above        Objective:   Physical Exam BP 120/72  Pulse 73  Temp(Src) 98.2 F (36.8 C) (Oral)  Ht 5\' 8"  (1.727 m)  Wt 227 lb 2 oz (103.023 kg)  BMI 34.54 kg/m2  SpO2 95% VS noted, mild ill appearing, fatigued but nontoxic Constitutional: Pt appears well-developed, well-nourished.  HENT: Head: NCAT.  Right Ear: External ear normal.  Left Ear: External ear  normal.  Eyes: . Pupils are equal, round, and reactive to light. Conjunctivae and EOM are normal Neck: Normal range of motion. Neck supple.  Cardiovascular: Normal rate and regular rhythm.   Pulmonary/Chest: Effort normal and breath sounds normal.  Abd:  Soft, ND, + BS, no HSM but tender LLQ mild without guarding or rebound Neurological: Pt is alert. Not confused , motor grossly intact Skin: Skin is warm. No rash Psychiatric: Pt behavior is normal. No agitation.     Assessment & Plan:

## 2014-01-16 ENCOUNTER — Encounter: Payer: Self-pay | Admitting: Internal Medicine

## 2014-01-16 LAB — URINE CULTURE
Colony Count: NO GROWTH
Organism ID, Bacteria: NO GROWTH

## 2014-01-16 LAB — SEDIMENTATION RATE: Sed Rate: 24 mm/hr — ABNORMAL HIGH (ref 0–22)

## 2014-01-21 LAB — CULTURE, BLOOD (SINGLE): ORGANISM ID, BACTERIA: NO GROWTH

## 2014-01-22 ENCOUNTER — Encounter: Payer: Self-pay | Admitting: Internal Medicine

## 2014-01-29 ENCOUNTER — Ambulatory Visit: Payer: Medicare Other | Admitting: Family Medicine

## 2014-03-04 ENCOUNTER — Encounter: Payer: Medicare Other | Admitting: Gastroenterology

## 2014-03-11 ENCOUNTER — Encounter: Payer: Medicare Other | Admitting: Internal Medicine

## 2014-03-20 ENCOUNTER — Ambulatory Visit: Payer: Medicare Other | Admitting: Family Medicine

## 2014-03-29 DIAGNOSIS — I10 Essential (primary) hypertension: Secondary | ICD-10-CM | POA: Diagnosis not present

## 2014-03-29 DIAGNOSIS — I498 Other specified cardiac arrhythmias: Secondary | ICD-10-CM | POA: Diagnosis not present

## 2014-03-29 DIAGNOSIS — R4182 Altered mental status, unspecified: Secondary | ICD-10-CM | POA: Diagnosis not present

## 2014-03-29 DIAGNOSIS — L408 Other psoriasis: Secondary | ICD-10-CM | POA: Diagnosis not present

## 2014-03-29 DIAGNOSIS — R42 Dizziness and giddiness: Secondary | ICD-10-CM | POA: Diagnosis not present

## 2014-03-29 DIAGNOSIS — F29 Unspecified psychosis not due to a substance or known physiological condition: Secondary | ICD-10-CM | POA: Diagnosis not present

## 2014-03-29 DIAGNOSIS — H8109 Meniere's disease, unspecified ear: Secondary | ICD-10-CM | POA: Diagnosis not present

## 2014-03-29 DIAGNOSIS — F05 Delirium due to known physiological condition: Secondary | ICD-10-CM | POA: Diagnosis not present

## 2014-03-29 DIAGNOSIS — H9319 Tinnitus, unspecified ear: Secondary | ICD-10-CM | POA: Diagnosis not present

## 2014-03-29 DIAGNOSIS — Z8673 Personal history of transient ischemic attack (TIA), and cerebral infarction without residual deficits: Secondary | ICD-10-CM | POA: Diagnosis not present

## 2014-03-29 DIAGNOSIS — R51 Headache: Secondary | ICD-10-CM | POA: Diagnosis not present

## 2014-03-29 DIAGNOSIS — Z1389 Encounter for screening for other disorder: Secondary | ICD-10-CM | POA: Diagnosis not present

## 2014-03-29 DIAGNOSIS — I440 Atrioventricular block, first degree: Secondary | ICD-10-CM | POA: Diagnosis not present

## 2014-03-29 DIAGNOSIS — Z87891 Personal history of nicotine dependence: Secondary | ICD-10-CM | POA: Diagnosis not present

## 2014-03-29 DIAGNOSIS — G319 Degenerative disease of nervous system, unspecified: Secondary | ICD-10-CM | POA: Diagnosis not present

## 2014-03-29 DIAGNOSIS — H9190 Unspecified hearing loss, unspecified ear: Secondary | ICD-10-CM | POA: Diagnosis not present

## 2014-04-16 ENCOUNTER — Encounter: Payer: Self-pay | Admitting: Family Medicine

## 2014-04-16 ENCOUNTER — Ambulatory Visit (INDEPENDENT_AMBULATORY_CARE_PROVIDER_SITE_OTHER): Payer: Medicare Other | Admitting: Family Medicine

## 2014-04-16 VITALS — BP 120/80 | Temp 97.4°F | Wt 223.0 lb

## 2014-04-16 DIAGNOSIS — H8110 Benign paroxysmal vertigo, unspecified ear: Secondary | ICD-10-CM | POA: Diagnosis not present

## 2014-04-16 DIAGNOSIS — Z23 Encounter for immunization: Secondary | ICD-10-CM

## 2014-04-16 HISTORY — DX: Benign paroxysmal vertigo, unspecified ear: H81.10

## 2014-04-16 NOTE — Progress Notes (Signed)
Pre visit review using our clinic review tool, if applicable. No additional management support is needed unless otherwise documented below in the visit note. 

## 2014-04-16 NOTE — Progress Notes (Signed)
   Subjective:    Patient ID: Daniel Guerrero, male    DOB: [birth year], 68 y.o.   MRN: [id number]  HPI Daniel Guerrero is a 68 year old male who comes in today for followup of vertigo  He states he was driving up to [place] a couple weeks ago. He got as far as any angina went to get out of the car and experienced profound vertigo. He had to stay h for 6-8 hours lie flat. He went on to his trip to [place] but had episodes of vertigo every other day. He then came back and his daughter-in-law who is a Teacher, music at [employer] had him come to [facility] and go to emergency for evaluation. All studies were negative. The vertigo is gone now.   Review of Systems Review of systems otherwise negative    Objective:   Physical Exam  Well-developed well-nourished male no acute distress vital signs stable he is afebrile HEENT negative      Assessment & Plan:  Vertigo resolved.............. ENT consult when necessary.

## 2014-04-16 NOTE — Patient Instructions (Signed)
Return when necessary 

## 2014-04-17 DIAGNOSIS — I471 Supraventricular tachycardia: Secondary | ICD-10-CM | POA: Diagnosis not present

## 2014-05-15 DIAGNOSIS — H8102 Meniere's disease, left ear: Secondary | ICD-10-CM | POA: Diagnosis not present

## 2014-09-25 ENCOUNTER — Ambulatory Visit (INDEPENDENT_AMBULATORY_CARE_PROVIDER_SITE_OTHER): Payer: Medicare Other | Admitting: Internal Medicine

## 2014-09-25 ENCOUNTER — Encounter: Payer: Self-pay | Admitting: Internal Medicine

## 2014-09-25 VITALS — BP 148/90 | HR 63 | Temp 98.0°F | Resp 20 | Ht 68.0 in | Wt 232.0 lb

## 2014-09-25 DIAGNOSIS — J069 Acute upper respiratory infection, unspecified: Secondary | ICD-10-CM | POA: Diagnosis not present

## 2014-09-25 DIAGNOSIS — J3089 Other allergic rhinitis: Secondary | ICD-10-CM

## 2014-09-25 DIAGNOSIS — I10 Essential (primary) hypertension: Secondary | ICD-10-CM

## 2014-09-25 DIAGNOSIS — B9789 Other viral agents as the cause of diseases classified elsewhere: Secondary | ICD-10-CM

## 2014-09-25 MED ORDER — FLUTICASONE PROPIONATE 50 MCG/ACT NA SUSP: 2.0000 | Freq: Every day | NASAL | Status: DC

## 2014-09-25 MED ORDER — HYDROCODONE-HOMATROPINE 5-1.5 MG/5ML PO SYRP: 5.0000 mL | ORAL_SOLUTION | Freq: Four times a day (QID) | ORAL | Status: DC | PRN

## 2014-09-25 NOTE — Patient Instructions (Addendum)
Acute sinusitis symptoms for less than 10 days are generally not helped by antibiotic therapy.  Use saline irrigation, warm  moist compresses and over-the-counter decongestants only as directed.  Call if there is no improvement in 5 to 7 days, or sooner if you develop increasing pain, fever, or any new symptoms. Take over-the-counter expectorants and cough medications such as  Mucinex DM.   Use fluticasone nasal spray daily  TREATMENT  Most cases of acute sinusitis are related to a viral infection and will resolve on their own within 10 days. Sometimes medicines are prescribed to help relieve symptoms (pain medicine, decongestants, nasal steroid sprays, or saline sprays).   HOME CARE INSTRUCTIONS  Drink plenty of water. Water helps thin the mucus so your sinuses can drain more easily.  Use a humidifier.  Inhale steam 3 to 4 times a day (for example, sit in the bathroom with the shower running).  Apply a warm, moist washcloth to your face 3 to 4 times a day, or as directed by your health care provider.  Use saline nasal sprays to help moisten and clean your sinuses.

## 2014-09-25 NOTE — Progress Notes (Signed)
Subjective:    Patient ID: Daniel Guerrero, male    DOB: 1946-04-29, 69 y.o.   MRN: [id number]  HPI  69 year old patient who has a history of hypertension and allergic rhinitis.  For the past 3 days has had increase in head and chest congestion, sore throat, sinus congestion and productive cough.  No fever.  He has been using the loratadine and Mucinex.  Past Medical History  Diagnosis Date  . Hypertension   . Eczema   . Psoriasis   . Hearing loss     high frequency  . Cataract of both eyes   . Erectile dysfunction   . Obesity   . S/P appendectomy   . History of inguinal hernia repair, bilateral   . S/P tonsillectomy   . Loss of hearing     high frequency  . Paroxysmal SVT (supraventricular tachycardia)   . Type II or unspecified type diabetes mellitus without mention of complication, uncontrolled     History   Social History  . Marital Status: Married    Spouse Name: N/A  . Number of Children: N/A  . Years of Education: N/A   Occupational History  . Not on file.   Social History Main Topics  . Smoking status: Former Research scientist (life sciences)  . Smokeless tobacco: Not on file     Comment: Quit 20-30 years ago  . Alcohol Use: 1.8 oz/week    1 Glasses of wine, 1 Cans of beer, 1 Shots of liquor per week     Comment: occasional  . Drug Use: No  . Sexual Activity: Not on file   Other Topics Concern  . Not on file   Social History Narrative   Daily Caffeine use: 5-6 drinks daily   Exercising 2 times per week   No Drug use             Past Surgical History  Procedure Laterality Date  . Appendectomy    . Hernia repair      right and left  . Tonsillectomy      Family History  Problem Relation Age of Onset  . Cancer Mother     lung  . Diabetes Father   . Thyroid disease Mother   . Lung cancer Mother   . Colon cancer Neg Hx     Allergies  Allergen Reactions  . Peanut-Containing Drug Products   . Penicillins Hives    Current Outpatient Prescriptions on File  Prior to Visit  Medication Sig Dispense Refill  . amLODipine (NORVASC) 5 MG tablet TAKE 1 TABLET BY MOUTH DAILY. 100 tablet 3  . aspirin EC 81 MG tablet Take 81 mg by mouth daily.    Marland Kitchen atenolol (TENORMIN) 25 MG tablet One half tab every 6 hours when necessary for SVT 50 tablet 1  . cetirizine (ZYRTEC) 10 MG tablet Take 10 mg by mouth daily.    . Cholecalciferol (VITAMIN D PO) Take 1 tablet by mouth daily.    . clobetasol ointment (TEMOVATE) 0.05 % Apply topically daily. Apply each bedtime 60 g 3  . Cyanocobalamin (VITAMIN B-12 PO) Take 1 tablet by mouth daily.    Marland Kitchen desonide (DESOWEN) 0.05 % cream Apply topically 2 (two) times daily. Twice a month for skin rash 60 g 3  . doxazosin (CARDURA) 1 MG tablet 1 by mouth each bedtime for BPH 100 tablet 3  . guaiFENesin (MUCINEX) 600 MG 12 hr tablet Take 600 mg by mouth 2 (two) times daily.    Marland Kitchen  sildenafil (VIAGRA) 50 MG tablet Take 1 tablet (50 mg total) by mouth as needed for erectile dysfunction. 10 tablet 11  . triamcinolone ointment (KENALOG) 0.1 % Apply topically 2 (two) times daily. Apply thin layer 80 g 3  . vitamin C (ASCORBIC ACID) 500 MG tablet Take 500 mg by mouth daily.     No current facility-administered medications on file prior to visit.    BP 148/90 mmHg  Pulse 63  Temp(Src) 98 F (36.7 C) (Oral)  Resp 20  Ht 5\' 8"  (1.727 m)  Wt 232 lb (105.235 kg)  BMI 35.28 kg/m2  SpO2 96%     Review of Systems  Constitutional: Positive for activity change, appetite change and fatigue. Negative for fever and chills.  HENT: Positive for congestion, postnasal drip, rhinorrhea and sinus pressure. Negative for dental problem, ear pain, hearing loss, sore throat, tinnitus, trouble swallowing and voice change.   Eyes: Negative for pain, discharge and visual disturbance.  Respiratory: Positive for cough. Negative for chest tightness, wheezing and stridor.   Cardiovascular: Negative for chest pain, palpitations and leg swelling.    Gastrointestinal: Negative for nausea, vomiting, abdominal pain, diarrhea, constipation, blood in stool and abdominal distention.  Genitourinary: Negative for urgency, hematuria, flank pain, discharge, difficulty urinating and genital sores.  Musculoskeletal: Negative for myalgias, back pain, joint swelling, arthralgias, gait problem and neck stiffness.  Skin: Negative for rash.  Neurological: Negative for dizziness, syncope, speech difficulty, weakness, numbness and headaches.  Hematological: Negative for adenopathy. Does not bruise/bleed easily.  Psychiatric/Behavioral: Negative for behavioral problems and dysphoric mood. The patient is not nervous/anxious.        Objective:   Physical Exam  Constitutional: He is oriented to person, place, and time. He appears well-developed.  HENT:  Head: Normocephalic.  Right Ear: External ear normal.  Left Ear: External ear normal.  Hearing aids removed.  Tympanic membranes and canals.  Normal  Eyes: Conjunctivae and EOM are normal.  Neck: Normal range of motion.  Cardiovascular: Normal rate and normal heart sounds.   Pulmonary/Chest: Breath sounds normal.  Abdominal: Bowel sounds are normal.  Musculoskeletal: Normal range of motion. He exhibits no edema or tenderness.  Lymphadenopathy:    He has no cervical adenopathy.  Neurological: He is alert and oriented to person, place, and time.  Psychiatric: He has a normal mood and affect. His behavior is normal.          Assessment & Plan:   Viral URI with cough. History allergic rhinitis  Will treat symptomatically Add fluticasone nasal spray

## 2014-09-25 NOTE — Progress Notes (Signed)
Pre visit review using our clinic review tool, if applicable. No additional management support is needed unless otherwise documented below in the visit note. 

## 2014-09-28 ENCOUNTER — Encounter: Payer: Self-pay | Admitting: Family Medicine

## 2014-09-28 ENCOUNTER — Ambulatory Visit (INDEPENDENT_AMBULATORY_CARE_PROVIDER_SITE_OTHER): Payer: Medicare Other | Admitting: Family Medicine

## 2014-09-28 VITALS — BP 120/78 | HR 87 | Temp 98.3°F | Ht 68.0 in | Wt 230.8 lb

## 2014-09-28 DIAGNOSIS — J3089 Other allergic rhinitis: Secondary | ICD-10-CM | POA: Diagnosis not present

## 2014-09-28 DIAGNOSIS — R040 Epistaxis: Secondary | ICD-10-CM

## 2014-09-28 MED ORDER — PREDNISONE 20 MG PO TABS: ORAL_TABLET | ORAL | Status: DC

## 2014-09-28 NOTE — Patient Instructions (Signed)
Stop the aspirin and steroid nasal spray  Plain antihistamine daily  Hydromet,,,,, 1/2-1 teaspoon at bedtime for nighttime cough  If you don't see any improvement a week or 10 days take a short course of prednisone as outlined

## 2014-09-28 NOTE — Progress Notes (Signed)
   Subjective:    Patient ID: Daniel Guerrero, male    DOB: [birth year], 69 y.o.   MRN: [id number]  HPI Daniel Guerrero is a 69 year old married male nonsmoker who comes in today for evaluation of allergic rhinitis  He's had allergy symptoms now for about 10 days and saw Dr. Raliegh Ip last Friday. He prescribed antihistamines steroid nasal spray. Over the weekend he began having some bleeding from his left nose. He also takes a baby aspirin daily. He has no asthma.    Review of Systems Review of systems otherwise negative    Objective:   Physical Exam Well-developed well-nourished male no acute distress vital signs stable he is afebrile HEENT were negative except for a clot in the septum left nares       Assessment & Plan:  Allergic rhinitis with secondary nosebleed from aspirin steroid nasal spray,,,,,, stop the aspirin steroid nasal spray continue antihistamine

## 2014-09-28 NOTE — Progress Notes (Signed)
Pre visit review using our clinic review tool, if applicable. No additional management support is needed unless otherwise documented below in the visit note. 

## 2014-10-14 DIAGNOSIS — I471 Supraventricular tachycardia: Secondary | ICD-10-CM | POA: Diagnosis not present

## 2014-11-09 ENCOUNTER — Ambulatory Visit (INDEPENDENT_AMBULATORY_CARE_PROVIDER_SITE_OTHER): Payer: Medicare Other | Admitting: Family Medicine

## 2014-11-09 VITALS — BP 140/90 | Temp 98.0°F | Wt 231.0 lb

## 2014-11-09 DIAGNOSIS — J4521 Mild intermittent asthma with (acute) exacerbation: Secondary | ICD-10-CM

## 2014-11-09 DIAGNOSIS — J3089 Other allergic rhinitis: Secondary | ICD-10-CM | POA: Diagnosis not present

## 2014-11-09 DIAGNOSIS — J45901 Unspecified asthma with (acute) exacerbation: Secondary | ICD-10-CM

## 2014-11-09 HISTORY — DX: Unspecified asthma with (acute) exacerbation: J45.901

## 2014-11-09 MED ORDER — HYDROCODONE-HOMATROPINE 5-1.5 MG/5ML PO SYRP: ORAL_SOLUTION | ORAL | Status: DC

## 2014-11-09 MED ORDER — AZITHROMYCIN 250 MG PO TABS: ORAL_TABLET | ORAL | Status: DC

## 2014-11-09 NOTE — Patient Instructions (Signed)
Stop the Mucinex  Stop the Claritin and steroid nasal spray  Prednisone........ 3 tabs tonight........ then starting tomorrow morning 2 tabs daily  Azithromycin......Marland Kitchen 1 daily for 10 days  Hydromet...Marland KitchenMarland KitchenMarland Kitchen 1/2-1 teaspoon at bedtime when necessary for nighttime cough  Return in one week for follow-up

## 2014-11-09 NOTE — Progress Notes (Signed)
Pre visit review using our clinic review tool, if applicable. No additional management support is needed unless otherwise documented below in the visit note. 

## 2014-11-09 NOTE — Progress Notes (Signed)
   Subjective:    Patient ID: Daniel Guerrero, male    DOB: Aug 19, 1945, 69 y.o.   MRN: [id number]  HPI Daniel Guerrero is a 69 year old married male nonsmoker who comes in today for evaluation of a congestion sore throat postnasal drip and cough on and off now for couple months  He has perennial allergic rhinitis and some seasons are worse than others. Over the last 6 months she's had fairly consistent symptoms head congestion postnasal drip and cough. Week ago he started wheezing and therefore he restart his prednisone. He's taken 40 mg daily for 3 days taper and doesn't feel any better.   Review of Systems Review of systems otherwise negative    Objective:   Physical Exam Well-developed well-nourished male no acute distress vital signs stable he is afebrile HEENT were negative except for a lot of postnasal drip and marked nasal edema neck was supple no adenopathy thyroid normal lungs are clear to auscultation except for mild inspiratory and expiratory wheezing symmetrical:       Assessment & Plan:  Allergic rhinitis......... Claritin  Asthma.......... unresolved with prednisone......Marland Kitchen And azithromycin,,,,,,,, increased prednisone,,,,,, Hydromet at bedtime,,,,,, return in one week for follow-up.

## 2014-11-17 ENCOUNTER — Ambulatory Visit (INDEPENDENT_AMBULATORY_CARE_PROVIDER_SITE_OTHER): Payer: Medicare Other | Admitting: Family Medicine

## 2014-11-17 ENCOUNTER — Encounter: Payer: Self-pay | Admitting: Family Medicine

## 2014-11-17 VITALS — HR 74 | Temp 98.3°F | Wt 229.0 lb

## 2014-11-17 DIAGNOSIS — J4521 Mild intermittent asthma with (acute) exacerbation: Secondary | ICD-10-CM

## 2014-11-17 NOTE — Progress Notes (Signed)
Pre visit review using our clinic review tool, if applicable. No additional management support is needed unless otherwise documented below in the visit note. 

## 2014-11-17 NOTE — Progress Notes (Signed)
   Subjective:    Patient ID: Daniel Guerrero, male    DOB: Nov 02, 1945, 69 y.o.   MRN: [id number]  HPI Trayvion is a 69 year old married male nonsmoker who comes in today for follow-up of asthma  We saw him last week. In a viral infection with triggered his wheezing. We started him on prednisone 40 mg daily he comes in today saying is about 60-70% better.   Review of Systems    review of systems otherwise negative Objective:   Physical Exam  Well-developed well-nourished male no acute distress vital signs stable he is afebrile HEENT were negative neck was supple no adenopathy lungs are clear except for mild wheezing with forced expiration      Assessment & Plan:  Asthma resolving slowly.......... taper prednisone return when necessary

## 2014-11-17 NOTE — Patient Instructions (Signed)
Prednisone 20 mg........ starting tomorrow a tablet a half a day for 3 days, 1 tab for 3 days, half a tab for 3 days, then a half a tab every other day for a two-week taper  Return when necessary

## 2014-11-23 ENCOUNTER — Emergency Department (HOSPITAL_COMMUNITY)
Admission: EM | Admit: 2014-11-23 | Discharge: 2014-11-23 | Disposition: A | Discharge status: Home / Self Care | Payer: Medicare Other | Attending: Emergency Medicine | Admitting: Emergency Medicine

## 2014-11-23 ENCOUNTER — Encounter (HOSPITAL_COMMUNITY): Payer: Self-pay | Admitting: *Deleted

## 2014-11-23 DIAGNOSIS — D688 Other specified coagulation defects: Secondary | ICD-10-CM | POA: Diagnosis present

## 2014-11-23 DIAGNOSIS — R58 Hemorrhage, not elsewhere classified: Secondary | ICD-10-CM | POA: Insufficient documentation

## 2014-11-23 DIAGNOSIS — Z7951 Long term (current) use of inhaled steroids: Secondary | ICD-10-CM | POA: Insufficient documentation

## 2014-11-23 DIAGNOSIS — E669 Obesity, unspecified: Secondary | ICD-10-CM | POA: Diagnosis not present

## 2014-11-23 DIAGNOSIS — H9190 Unspecified hearing loss, unspecified ear: Secondary | ICD-10-CM | POA: Insufficient documentation

## 2014-11-23 DIAGNOSIS — Z87438 Personal history of other diseases of male genital organs: Secondary | ICD-10-CM | POA: Diagnosis not present

## 2014-11-23 DIAGNOSIS — E119 Type 2 diabetes mellitus without complications: Secondary | ICD-10-CM | POA: Diagnosis not present

## 2014-11-23 DIAGNOSIS — I83891 Varicose veins of right lower extremities with other complications: Secondary | ICD-10-CM | POA: Diagnosis not present

## 2014-11-23 DIAGNOSIS — Z880 Allergy status to penicillin: Secondary | ICD-10-CM | POA: Diagnosis not present

## 2014-11-23 DIAGNOSIS — Z8719 Personal history of other diseases of the digestive system: Secondary | ICD-10-CM | POA: Diagnosis not present

## 2014-11-23 DIAGNOSIS — Z79899 Other long term (current) drug therapy: Secondary | ICD-10-CM | POA: Diagnosis not present

## 2014-11-23 DIAGNOSIS — I10 Essential (primary) hypertension: Secondary | ICD-10-CM | POA: Insufficient documentation

## 2014-11-23 DIAGNOSIS — S8991XA Unspecified injury of right lower leg, initial encounter: Secondary | ICD-10-CM | POA: Diagnosis not present

## 2014-11-23 DIAGNOSIS — Z87891 Personal history of nicotine dependence: Secondary | ICD-10-CM | POA: Diagnosis not present

## 2014-11-23 DIAGNOSIS — Z792 Long term (current) use of antibiotics: Secondary | ICD-10-CM | POA: Insufficient documentation

## 2014-11-23 DIAGNOSIS — I471 Supraventricular tachycardia: Secondary | ICD-10-CM | POA: Diagnosis not present

## 2014-11-23 DIAGNOSIS — Z872 Personal history of diseases of the skin and subcutaneous tissue: Secondary | ICD-10-CM | POA: Diagnosis not present

## 2014-11-23 DIAGNOSIS — Z7952 Long term (current) use of systemic steroids: Secondary | ICD-10-CM | POA: Diagnosis not present

## 2014-11-23 MED ORDER — SILVER NITRATE-POT NITRATE 75-25 % EX MISC
CUTANEOUS | Status: AC
  Administered 2014-11-23: 21:00:00
  Filled 2014-11-23: qty 1

## 2014-11-23 NOTE — ED Notes (Signed)
Pt alert & oriented x4, stable gait. Patient  given discharge instructions, paperwork & prescription(s).  Patient verbalized understanding. Pt left department in wheelchair w/ no further questions. 

## 2014-11-23 NOTE — ED Provider Notes (Signed)
CSN: [id number]     Arrival date & time 11/23/14  1947 History   This chart was scribed for Daniel Ferguson, MD by Irene Pap, ED Scribe. This patient was seen in room APA04/APA04 and patient care was started at 8:13 PM.    Chief Complaint  Patient presents with  . Coagulation Disorder   Patient is a 69 y.o. male presenting with skin laceration. The history is provided by the patient. No language interpreter was used.  Laceration Location:  Leg Leg laceration location:  R ankle Depth:  Cutaneous Bleeding: controlled with pressure   Pain details:    Timing:  Constant Ineffective treatments:  None tried HPI Comments: Daniel Guerrero is a 69 y.o. male who presents to the Emergency Department brought in by EMS complaining of coagulation disorder onset PTA. Patient states that he reached down to scratch his leg because he thought he had a tick on it. He states that he picked the "tick" off of his right medial ankle and it began to spurt blood and was not able to stop bleeding. He reports taking prednisone for a sinus infection onset 2 months ago. He states that he does not take aspirin or blood thinners. He states that Dr. Sherren Mocha in [place] is his PCP.   Past Medical History  Diagnosis Date  . Hypertension   . Eczema   . Psoriasis   . Hearing loss     high frequency  . Cataract of both eyes   . Erectile dysfunction   . Obesity   . S/P appendectomy   . History of inguinal hernia repair, bilateral   . S/P tonsillectomy   . Loss of hearing     high frequency  . Paroxysmal SVT (supraventricular tachycardia)   . Type II or unspecified type diabetes mellitus without mention of complication, uncontrolled    Past Surgical History  Procedure Laterality Date  . Appendectomy    . Hernia repair      right and left  . Tonsillectomy     Family History  Problem Relation Age of Onset  . Cancer Mother     lung  . Diabetes Father   . Thyroid disease Mother   . Lung cancer Mother   .  Colon cancer Neg Hx    History  Substance Use Topics  . Smoking status: Former Research scientist (life sciences)  . Smokeless tobacco: Not on file     Comment: Quit 20-30 years ago  . Alcohol Use: 1.8 oz/week    1 Glasses of wine, 1 Cans of beer, 1 Shots of liquor per week     Comment: occasional    Review of Systems  Constitutional: Negative for appetite change and fatigue.  HENT: Negative for congestion, ear discharge and sinus pressure.   Eyes: Negative for discharge.  Respiratory: Negative for cough.   Cardiovascular: Negative for chest pain.  Gastrointestinal: Negative for abdominal pain and diarrhea.  Genitourinary: Negative for frequency and hematuria.  Musculoskeletal: Negative for back pain.  Skin: Positive for wound. Negative for rash.  Neurological: Negative for seizures and headaches.  Psychiatric/Behavioral: Negative for hallucinations.   Allergies  Peanut-containing drug products and Penicillins  Home Medications   Prior to Admission medications   Medication Sig Start Date End Date Taking? Authorizing Provider  amLODipine (NORVASC) 5 MG tablet TAKE 1 TABLET BY MOUTH DAILY. 12/18/13   Dorena Cookey, MD  atenolol (TENORMIN) 25 MG tablet One half tab every 6 hours when necessary for SVT 12/18/13   [name]  Delora Fuel, MD  azithromycin (ZITHROMAX) 250 MG tablet One by mouth daily 10 days 11/09/14   Dorena Cookey, MD  Cholecalciferol (VITAMIN D PO) Take 1 tablet by mouth daily.    Historical Provider, MD  clobetasol ointment (TEMOVATE) 0.05 % Apply topically daily. Apply each bedtime 12/18/13   Dorena Cookey, MD  Cyanocobalamin (VITAMIN B-12 PO) Take 1 tablet by mouth daily.    Historical Provider, MD  desonide (DESOWEN) 0.05 % cream Apply topically 2 (two) times daily. Twice a month for skin rash 12/18/13   Dorena Cookey, MD  doxazosin (CARDURA) 1 MG tablet 1 by mouth each bedtime for BPH 12/18/13   Dorena Cookey, MD  fluticasone [facility]) 50 MCG/ACT nasal spray Place 2 sprays into both nostrils  daily. 09/25/14   [name], MD  guaiFENesin (MUCINEX) 600 MG 12 hr tablet Take 600 mg by mouth 2 (two) times daily.    Historical Provider, MD  HYDROcodone-homatropine [name]) 5-1.5 MG/5ML syrup 1/2-1 teaspoon at bedtime when necessary for cough 11/09/14   Dorena Cookey, MD  loratadine (CLARITIN) 10 MG tablet Take 10 mg by mouth daily.    Historical Provider, MD  predniSONE (DELTASONE) 20 MG tablet 2 tabs x 3 days, 1 tab x 3 days, 1/2 tab x 3 days, 1/2 tab M,W,F x 2 weeks 09/28/14   Dorena Cookey, MD  sildenafil (VIAGRA) 50 MG tablet Take 1 tablet (50 mg total) by mouth as needed for erectile dysfunction. 12/18/13   Dorena Cookey, MD  triamcinolone ointment (KENALOG) 0.1 % Apply topically 2 (two) times daily. Apply thin layer 12/18/13   Dorena Cookey, MD  vitamin C (ASCORBIC ACID) 500 MG tablet Take 500 mg by mouth daily.    Historical Provider, MD   BP 152/76 mmHg  Pulse 73  Temp(Src) 98.1 F (36.7 C) (Oral)  Resp 19  Ht 5\' 11"  (1.803 m)  Wt 223 lb (101.152 kg)  BMI 31.12 kg/m2  SpO2 96%   Physical Exam  Constitutional: He is oriented to person, place, and time. He appears well-developed.  HENT:  Head: Normocephalic.  Eyes: Conjunctivae are normal.  Neck: No tracheal deviation present.  Cardiovascular:  No murmur heard. Musculoskeletal: Normal range of motion.  Neurological: He is oriented to person, place, and time.  Skin: Skin is warm.  Medial side of right ankle had a varicose vein that was bleeding  Psychiatric: He has a normal mood and affect.    ED Course  Procedures (including critical care time) DIAGNOSTIC STUDIES: Oxygen Saturation is 96% on room air, normal by my interpretation.    COORDINATION OF CARE: 8:16 PM-Discussed treatment plan which includes wound dressing and recheck with pt at bedside and pt agreed to plan.   8:41 PM-recheck of wound; use of silver nitrate to clot wound  Labs Review Labs Reviewed - No data to display  Imaging Review No  results found.   EKG Interpretation None    procedure.  Bleeding varicose vein on left ankle cauterized with silver nitrate stick by md  MDM   Final diagnoses:  None    Bleeding varicose vein    The chart was scribed for me under my direct supervision.  I personally performed the history, physical, and medical decision making and all procedures in the evaluation of this patient.Daniel Ferguson, MD 11/23/14 [phone]

## 2014-11-23 NOTE — Discharge Instructions (Signed)
Return if problems.  Replace the dressing tomorrow am

## 2014-11-23 NOTE — ED Notes (Signed)
Pt states he scratch a place on the right medial ankle & started bleeding & would not stop. Quick clot dressing applied w/ a pressure dressing.

## 2014-11-23 NOTE — ED Notes (Signed)
Pt brought in by [facility] for c/o bleeding to right lower leg; pt states he thought he had a tick on his leg and picked it off and it began "spurting" blood

## 2014-11-24 ENCOUNTER — Telehealth: Payer: Self-pay

## 2014-11-24 NOTE — Telephone Encounter (Signed)
noted 

## 2014-11-24 NOTE — Telephone Encounter (Signed)
PLEASE NOTE: All timestamps contained within this report are represented as [place] Standard Time. CONFIDENTIALTY NOTICE: This fax transmission is intended only for the addressee. It contains information that is legally privileged, confidential or otherwise protected from use or disclosure. If you are not the intended recipient, you are strictly prohibited from reviewing, disclosing, copying using or disseminating any of this information or taking any action in reliance on or regarding this information. If you have received this fax in error, please notify us immediately by telephone so that we can arrange for its return to [place]. Phone: [phone], Toll-Free: [phone], Fax: [phone] Page: 1 of 2 Call Id: [id number] [facility] Night - Client [place] Patient Name: Daniel Guerrero Gender: Male DOB: [birth year] Age: 69 Y 11 M 11 D Return Phone Number: [phone] (Primary) Address: [place] [address] Client [facility] Night - Client Client Site [facility] - Night Physician [place], [place] Type Call Call Type Triage / Clinical Relationship To Patient Self Return Phone Number [phone] (Primary) Chief Complaint BLEEDING - Uncontrollable (not vaginal) Initial Comment Caller states he has an arterial bleed. He is squirting blood on it. PreDisposition Call Doctor Nurse Assessment Nurse: Wynetta Emery, RN, Baker Janus Date/Time Eilene Ghazi Time): 11/23/2014 7:03:07 PM Confirm and document reason for call. If symptomatic, describe symptoms. ---Daniel Guerrero had a scab and scratched it off and it was over artery and now has squirting blood controlled with direct pressure over it. what to do Has the patient traveled out of the country within the last 30 days? ---No Does the patient require triage? ---Yes Related visit to physician within the last 2 weeks? ---No Does the PT have any  chronic conditions? (i.e. diabetes, asthma, etc.) ---Unknown Guidelines Guideline Title Affirmed Question Affirmed Notes Nurse Date/Time Eilene Ghazi Time) No Guideline Available - Sick Adult Call Nursing judgment or information in reference [place], [place] 11/23/2014 7:05:43 PM Disp. Time Eilene Ghazi Time) Disposition Final User 11/23/2014 7:01:32 PM Send to Urgent Rebekah [place] 11/23/2014 7:16:07 PM 911 Outcome Documentation Wynetta Emery, RN, Baker Janus Reason: 911 is in the home at this time and going to transport him to ED 11/23/2014 7:16:17 PM Call EMS 911 Now Yes Wynetta Emery, RN, Baker Janus Disposition Overriden: Go to ED Now Override Reason: Patient's symptoms need a higher level of care Caller Understands: Yes Disagree/Comply: Comply PLEASE NOTE: All timestamps contained within this report are represented as [place] Standard Time. CONFIDENTIALTY NOTICE: This fax transmission is intended only for the addressee. It contains information that is legally privileged, confidential or otherwise protected from use or disclosure. If you are not the intended recipient, you are strictly prohibited from reviewing, disclosing, copying using or disseminating any of this information or taking any action in reliance on or regarding this information. If you have received this fax in error, please notify us immediately by telephone so that we can arrange for its return to [place]. Phone: [phone], Toll-Free: [phone], Fax: [phone] Page: 2 of 2 Call Id: [id number] Care Advice Given Per Guideline CALL EMS 911 NOW: Immediate medical attention is needed. You need to hang up and call 911 (or an ambulance). Psychologist, forensic Discretion: I'll call you back in a few minutes to be sure you were able to reach them.) CARE ADVICE per nursing judgment or reference (No Guideline Available - [place] Adult Call; Adult) After Care Instructions Given Call Event Type User Date / Time Description  Pt complied and went to the ER

## 2014-12-28 ENCOUNTER — Emergency Department (HOSPITAL_COMMUNITY)
Admission: EM | Admit: 2014-12-28 | Discharge: 2014-12-29 | Disposition: A | Discharge status: Home / Self Care | Payer: Medicare Other | Attending: Emergency Medicine | Admitting: Emergency Medicine

## 2014-12-28 ENCOUNTER — Emergency Department (HOSPITAL_COMMUNITY): Payer: Medicare Other

## 2014-12-28 ENCOUNTER — Encounter (HOSPITAL_COMMUNITY): Payer: Self-pay | Admitting: *Deleted

## 2014-12-28 DIAGNOSIS — N1339 Other hydronephrosis: Secondary | ICD-10-CM | POA: Insufficient documentation

## 2014-12-28 DIAGNOSIS — Z872 Personal history of diseases of the skin and subcutaneous tissue: Secondary | ICD-10-CM | POA: Insufficient documentation

## 2014-12-28 DIAGNOSIS — Z87891 Personal history of nicotine dependence: Secondary | ICD-10-CM | POA: Insufficient documentation

## 2014-12-28 DIAGNOSIS — R1032 Left lower quadrant pain: Secondary | ICD-10-CM | POA: Diagnosis present

## 2014-12-28 DIAGNOSIS — Z880 Allergy status to penicillin: Secondary | ICD-10-CM | POA: Diagnosis not present

## 2014-12-28 DIAGNOSIS — H9190 Unspecified hearing loss, unspecified ear: Secondary | ICD-10-CM | POA: Insufficient documentation

## 2014-12-28 DIAGNOSIS — N132 Hydronephrosis with renal and ureteral calculous obstruction: Secondary | ICD-10-CM | POA: Diagnosis not present

## 2014-12-28 DIAGNOSIS — E119 Type 2 diabetes mellitus without complications: Secondary | ICD-10-CM | POA: Insufficient documentation

## 2014-12-28 DIAGNOSIS — I10 Essential (primary) hypertension: Secondary | ICD-10-CM | POA: Diagnosis not present

## 2014-12-28 DIAGNOSIS — E669 Obesity, unspecified: Secondary | ICD-10-CM | POA: Insufficient documentation

## 2014-12-28 DIAGNOSIS — N131 Hydronephrosis with ureteral stricture, not elsewhere classified: Secondary | ICD-10-CM | POA: Diagnosis not present

## 2014-12-28 DIAGNOSIS — N289 Disorder of kidney and ureter, unspecified: Secondary | ICD-10-CM

## 2014-12-28 DIAGNOSIS — N201 Calculus of ureter: Secondary | ICD-10-CM | POA: Diagnosis not present

## 2014-12-28 DIAGNOSIS — Z79899 Other long term (current) drug therapy: Secondary | ICD-10-CM | POA: Insufficient documentation

## 2014-12-28 DIAGNOSIS — Z9049 Acquired absence of other specified parts of digestive tract: Secondary | ICD-10-CM | POA: Insufficient documentation

## 2014-12-28 LAB — COMPREHENSIVE METABOLIC PANEL
ALBUMIN: 4.3 g/dL (ref 3.5–5.0)
ALK PHOS: 67 U/L (ref 38–126)
ALT: 25 U/L (ref 17–63)
AST: 62 U/L — AB (ref 15–41)
Anion gap: 7 (ref 5–15)
BUN: 24 mg/dL — ABNORMAL HIGH (ref 6–20)
CALCIUM: 8.5 mg/dL — AB (ref 8.9–10.3)
CO2: 25 mmol/L (ref 22–32)
Chloride: 106 mmol/L (ref 101–111)
Creatinine, Ser: 1.3 mg/dL — ABNORMAL HIGH (ref 0.61–1.24)
GFR calc Af Amer: >60 mL/min (ref >60)
GFR calc non Af Amer: 54 mL/min — ABNORMAL LOW (ref >60)
GLUCOSE: 166 mg/dL — AB (ref 65–99)
Potassium: 3.9 mmol/L (ref 3.5–5.1)
Sodium: 138 mmol/L (ref 135–145)
TOTAL PROTEIN: 6.9 g/dL (ref 6.5–8.1)
Total Bilirubin: 1.3 mg/dL — ABNORMAL HIGH (ref 0.3–1.2)

## 2014-12-28 LAB — CBC WITH DIFFERENTIAL/PLATELET
BASOS ABS: 0 10*3/uL (ref 0.0–0.1)
Basophils Relative: 0 % (ref 0–1)
Eosinophils Absolute: 0.2 10*3/uL (ref 0.0–0.7)
Eosinophils Relative: 2 % (ref 0–5)
HEMATOCRIT: 42.2 % (ref 39.0–52.0)
Hemoglobin: 14.7 g/dL (ref 13.0–17.0)
LYMPHS PCT: 12 % (ref 12–46)
Lymphs Abs: 1.4 10*3/uL (ref 0.7–4.0)
MCH: 33 pg (ref 26.0–34.0)
MCHC: 34.8 g/dL (ref 30.0–36.0)
MCV: 94.6 fL (ref 78.0–100.0)
Monocytes Absolute: 0.9 10*3/uL (ref 0.1–1.0)
Monocytes Relative: 8 % (ref 3–12)
NEUTROS ABS: 8.9 10*3/uL — AB (ref 1.7–7.7)
Neutrophils Relative %: 78 % — ABNORMAL HIGH (ref 43–77)
PLATELETS: 189 10*3/uL (ref 150–400)
RBC: 4.46 MIL/uL (ref 4.22–5.81)
RDW: 13.4 % (ref 11.5–15.5)
WBC: 11.5 10*3/uL — ABNORMAL HIGH (ref 4.0–10.5)

## 2014-12-28 LAB — LIPASE, BLOOD: Lipase: 25 U/L (ref 22–51)

## 2014-12-28 MED ORDER — ONDANSETRON HCL 4 MG/2ML IJ SOLN
4.0000 mg | Freq: Once | INTRAMUSCULAR | Status: AC
  Administered 2014-12-28: 4 mg via INTRAVENOUS
  Filled 2014-12-28: qty 2

## 2014-12-28 MED ORDER — SODIUM CHLORIDE 0.9 % IV BOLUS (SEPSIS)
1000.0000 mL | Freq: Once | INTRAVENOUS | Status: AC
  Administered 2014-12-28: 1000 mL via INTRAVENOUS

## 2014-12-28 MED ORDER — FENTANYL CITRATE (PF) 100 MCG/2ML IJ SOLN
50.0000 ug | Freq: Once | INTRAMUSCULAR | Status: AC
  Administered 2014-12-28: 50 ug via INTRAVENOUS
  Filled 2014-12-28: qty 2

## 2014-12-28 NOTE — ED Provider Notes (Signed)
CSN: [id number]     Arrival date & time 12/28/14  2210 History   First MD Initiated Contact with Patient 12/28/14 2257     Chief Complaint  Patient presents with  . Abdominal Pain     (Consider location/radiation/quality/duration/timing/severity/associated sxs/prior Treatment) HPI  Patient states about 9 PM tonight he had diffuse lower abdominal pain however he states the discomfort is sharper on the left and sometimes radiates into his left flank. He states the discomfort started acutely. The pain has been there constantly since it started. He has had nausea and vomited twice. He states he tried to take Vicodin for the pain however he vomited it back up. He denies hematuria. He states he's never had this discomfort before. He states taking a big deep breath makes it hurt more, see states nothing makes it feel better. He states he finds it hard to lay still he feels like he needs to move around to try to find it decision of comfort. He denies family history of kidney stones. He denies being a caffeine drinker or a heavy milk drinker.  PCP Dr Sherren Mocha  Past Medical History  Diagnosis Date  . Hypertension   . Eczema   . Psoriasis   . Hearing loss     high frequency  . Cataract of both eyes   . Erectile dysfunction   . Obesity   . S/P appendectomy   . History of inguinal hernia repair, bilateral   . S/P tonsillectomy   . Loss of hearing     high frequency  . Paroxysmal SVT (supraventricular tachycardia)   . Type II or unspecified type diabetes mellitus without mention of complication, uncontrolled    Past Surgical History  Procedure Laterality Date  . Appendectomy    . Hernia repair      right and left  . Tonsillectomy     Family History  Problem Relation Age of Onset  . Cancer Mother     lung  . Diabetes Father   . Thyroid disease Mother   . Lung cancer Mother   . Colon cancer Neg Hx    History  Substance Use Topics  . Smoking status: Former Research scientist (life sciences)  . Smokeless tobacco:  Not on file     Comment: Quit 20-30 years ago  . 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     Allergies  Peanut-containing drug products and Penicillins  Home Medications   Prior to Admission medications   Medication Sig Start Date End Date Taking? Authorizing Provider  amLODipine (NORVASC) 5 MG tablet TAKE 1 TABLET BY MOUTH DAILY. Patient not taking: Reported on 11/23/2014 12/18/13   Dorena Cookey, MD  atenolol (TENORMIN) 25 MG tablet One half tab every 6 hours when necessary for SVT Patient taking differently: Take 12.5 mg by mouth every 6 (six) hours as needed (for SVT). One half tab every 6 hours when necessary for SVT 12/18/13   Dorena Cookey, MD  azithromycin [facility]) 250 MG tablet One by mouth daily 10 days Patient not taking: Reported on 11/23/2014 11/09/14   Dorena Cookey, MD  Cholecalciferol (VITAMIN D PO) Take 1 tablet by mouth daily.    Historical Provider, MD  clobetasol ointment (TEMOVATE) 0.05 %  Apply topically daily. Apply each bedtime Patient not taking: Reported on 11/23/2014 12/18/13   Dorena Cookey, MD  Cyanocobalamin (VITAMIN B-12 PO) Take 1 tablet by mouth daily.    Historical Provider, MD  desonide (DESOWEN) 0.05 % cream Apply topically 2 (two) times daily. Twice a month for skin rash Patient taking differently: Apply 1 application topically daily as needed (for skin rash/irritatin).  12/18/13   Dorena Cookey, MD  doxazosin (CARDURA) 1 MG tablet 1 by mouth each bedtime for BPH Patient not taking: Reported on 11/23/2014 12/18/13   Dorena Cookey, MD  fluticasone [facility]) 50 MCG/ACT nasal spray Place 2 sprays into both nostrils daily. Patient not taking: Reported on 11/23/2014 09/25/14   [name], MD  HYDROcodone-homatropine  [facility]) 5-1.5 MG/5ML syrup 1/2-1 teaspoon at bedtime when necessary for cough Patient not taking: Reported on 11/23/2014 11/09/14   Dorena Cookey, MD  loratadine (CLARITIN) 10 MG tablet Take 10 mg by mouth daily.    Historical Provider, MD  ondansetron (ZOFRAN) 4 MG tablet Take 1 tablet (4 mg total) by mouth every 8 (eight) hours as needed for nausea or vomiting. 12/29/14   Rolland Porter, MD  oxyCODONE-acetaminophen (PERCOCET/ROXICET) 5-325 MG per tablet Take 1-2 tablets by mouth every 4 (four) hours as needed for severe pain. 12/29/14   Rolland Porter, MD  oxyCODONE-acetaminophen (PERCOCET/ROXICET) 5-325 MG per tablet Take 1-2 tablets by mouth every 4 (four) hours as needed for severe pain. 12/29/14   Rolland Porter, MD  predniSONE (DELTASONE) 20 MG tablet 2 tabs x 3 days, 1 tab x 3 days, 1/2 tab x 3 days, 1/2 tab M,W,F x 2 weeks Patient taking differently: 1/2 tab daily for 3 days, then take 1/2 tab M,W,F for 2 weeks 09/28/14   Dorena Cookey, MD  sildenafil (VIAGRA) 50 MG tablet Take 1 tablet (50 mg total) by mouth as needed for erectile dysfunction. 12/18/13   Dorena Cookey, MD  tamsulosin (FLOMAX) 0.4 MG CAPS capsule Take 1 po QD until you pass the stone. 12/29/14   Rolland Porter, MD  triamcinolone ointment (KENALOG) 0.1 % Apply topically 2 (two) times daily. Apply thin layer Patient taking differently: Apply 1 application topically 2 (two) times daily as needed (apply a thin layer as needed for skin rash/irritation). Apply thin layer 12/18/13   Dorena Cookey, MD  vitamin C (ASCORBIC ACID) 500 MG tablet Take 500 mg by mouth daily.    Historical Provider, MD   BP 153/81 mmHg  Pulse 62  Temp(Src) 97.6 F (36.4 C) (Oral)  Resp 16  Ht 5\' 9"  (1.753 m)  Wt 225 lb (102.059 kg)  BMI 33.21 kg/m2  SpO2 99%  Vital signs normal   Physical Exam  Constitutional: He is oriented to person, place, and time. He appears well-developed and well-nourished.  Non-toxic appearance. He does not appear ill. No distress.  HENT:  Head:  Normocephalic and atraumatic.  Right Ear: External ear normal.  Left Ear: External ear normal.  Nose: Nose normal. No mucosal edema or rhinorrhea.  Mouth/Throat: Oropharynx is clear and moist and mucous membranes are normal. No dental abscesses or uvula swelling.  Eyes: Conjunctivae and EOM are normal. Pupils are equal, round, and reactive to light.  Neck: Normal range of motion and full passive range of motion without pain. Neck supple.  Cardiovascular: Normal rate, regular rhythm and normal heart sounds.  Exam reveals no gallop and no friction rub.   No murmur heard. Pulmonary/Chest: Effort normal and  breath sounds normal. No respiratory distress. He has no wheezes. He has no rhonchi. He has no rales. He exhibits no tenderness and no crepitus.  Abdominal: Soft. Normal appearance and bowel sounds are normal. He exhibits no distension. There is tenderness in the left lower quadrant. There is no rebound and no guarding.    Musculoskeletal: Normal range of motion. He exhibits no edema or tenderness.  Moves all extremities well.   Neurological: He is alert and oriented to person, place, and time. He has normal strength. No cranial nerve deficit.  Skin: Skin is warm, dry and intact. No rash noted. No erythema. No pallor.  Psychiatric: He has a normal mood and affect. His speech is normal and behavior is normal. His mood appears not anxious.  Nursing note and vitals reviewed.   ED Course  Procedures (including critical care time)  Medications  sodium chloride 0.9 % bolus 1,000 mL (0 mLs Intravenous Stopped 12/29/14 0041)  fentaNYL (SUBLIMAZE) injection 50 mcg (50 mcg Intravenous Given 12/28/14 2329)  ondansetron (ZOFRAN) injection 4 mg (4 mg Intravenous Given 12/28/14 2329)  fentaNYL (SUBLIMAZE) injection 50 mcg (50 mcg Intravenous Given 12/29/14 0126)    Patient had ultrasound of the abdomen done in January 2012 which shows his left kidney is atopic and in the left lower quadrant. Patient was not  aware of that finding.  Patient was given a liter IV fluids for presumed dehydration. He states he mowed grass today in the heat. This may account for his new renal insufficiency.   01:10 pt was rechecked, he states his pain is greatly improved. He would however like one more dose of pain medicine before going home. He was given his test results. He does have a large stone that is stuck at the UPJ which decreases his chance of passing the stone. We discussed follow up with urology today. We discussed f/u if he develops fever, or has uncontrolled pain or vomiting he should proceed to [facility] long emergency department.   Labs Review Results for orders placed or performed during the hospital encounter of 12/28/14  Urinalysis, Routine w reflex microscopic (not at [facility])  Result Value Ref Range   Color, Urine AMBER (A) YELLOW   APPearance HAZY (A) CLEAR   Specific Gravity, Urine >1.030 (H) 1.005 - 1.030   pH 5.0 5.0 - 8.0   Glucose, UA NEGATIVE NEGATIVE mg/dL   Hgb urine dipstick LARGE (A) NEGATIVE   Bilirubin Urine NEGATIVE NEGATIVE   Ketones, ur 15 (A) NEGATIVE mg/dL   Protein, ur TRACE (A) NEGATIVE mg/dL   Urobilinogen, UA 0.2 0.0 - 1.0 mg/dL   Nitrite NEGATIVE NEGATIVE   Leukocytes, UA NEGATIVE NEGATIVE  Comprehensive metabolic panel  Result Value Ref Range   Sodium 138 135 - 145 mmol/L   Potassium 3.9 3.5 - 5.1 mmol/L   Chloride 106 101 - 111 mmol/L   CO2 25 22 - 32 mmol/L   Glucose, Bld 166 (H) 65 - 99 mg/dL   BUN 24 (H) 6 - 20 mg/dL   Creatinine, Ser 1.30 (H) 0.61 - 1.24 mg/dL   Calcium 8.5 (L) 8.9 - 10.3 mg/dL   Total Protein 6.9 6.5 - 8.1 g/dL   Albumin 4.3 3.5 - 5.0 g/dL   AST 62 (H) 15 - 41 U/L   ALT 25 17 - 63 U/L   Alkaline Phosphatase 67 38 - 126 U/L   Total Bilirubin 1.3 (H) 0.3 - 1.2 mg/dL   GFR calc non Af Amer 54 (L) >60  mL/min   GFR calc Af Amer >60 >60 mL/min   Anion gap 7 5 - 15  CBC with Differential  Result Value Ref Range   WBC 11.5 (H) 4.0 - 10.5 K/uL    RBC 4.46 4.22 - 5.81 MIL/uL   Hemoglobin 14.7 13.0 - 17.0 g/dL   HCT 42.2 39.0 - 52.0 %   MCV 94.6 78.0 - 100.0 fL   MCH 33.0 26.0 - 34.0 pg   MCHC 34.8 30.0 - 36.0 g/dL   RDW 13.4 11.5 - 15.5 %   Platelets 189 150 - 400 K/uL   Neutrophils Relative % 78 (H) 43 - 77 %   Neutro Abs 8.9 (H) 1.7 - 7.7 K/uL   Lymphocytes Relative 12 12 - 46 %   Lymphs Abs 1.4 0.7 - 4.0 K/uL   Monocytes Relative 8 3 - 12 %   Monocytes Absolute 0.9 0.1 - 1.0 K/uL   Eosinophils Relative 2 0 - 5 %   Eosinophils Absolute 0.2 0.0 - 0.7 K/uL   Basophils Relative 0 0 - 1 %   Basophils Absolute 0.0 0.0 - 0.1 K/uL  Lipase, blood  Result Value Ref Range   Lipase 25 22 - 51 U/L  Urine microscopic-add on  Result Value Ref Range   WBC, UA 0-2 <3 WBC/hpf   RBC / HPF TOO NUMEROUS TO COUNT <3 RBC/hpf   Bacteria, UA MANY (A) RARE   Laboratory interpretation all normal except leukocytosis, new renal insufficiency, concentrated urine consistent with dehydration, hematuria without increased white blood cells     Imaging Review Ct Renal Stone Study  12/29/2014   CLINICAL DATA:  Sudden onset of left flank pain. Left lower quadrant pain.  EXAM: CT ABDOMEN AND PELVIS WITHOUT CONTRAST  TECHNIQUE: Multidetector CT imaging of the abdomen and pelvis was performed following the standard protocol without IV contrast.  COMPARISON:  Abdominal ultrasound 08/22/2010  FINDINGS: Mild atelectasis at the lung bases and lingula. There are coronary artery calcifications.  There is a 7 mm obstructing stone in the left proximal ureter at the level of L4 with resultant moderate hydroureteronephrosis and mild perinephric stranding. Punctate nonobstructing stone in the upper left kidney. Multiple left renal cysts in the lower pole, largest measuring 5.5 cm.  No right renal stones or obstructive uropathy. Cyst in the lower right kidney measures 5.8 cm.  Evaluation of the remaining solid and hollow viscera is limited given lack of contrast. Multiple  cysts scattered throughout the liver, ranging from 1-2 cm. No evidence of suspicious lesion allowing for lack contrast. Gallbladder is physiologically distended. The unenhanced spleen, adrenal glands, and pancreas are normal.  Small hiatal hernia. Stomach is physiologically distended. There are no dilated or thickened bowel loops. The appendix not visualized. Small volume of colonic stool with scattered colonic diverticula, no diverticulitis.  No free air, free fluid, or intra-abdominal fluid collection. Small fat containing umbilical hernia.  No retroperitoneal adenopathy. Abdominal aorta is normal in caliber. Tortuosity and atherosclerosis of the abdominal aorta.  Within the pelvis the urinary bladder is minimally distended. Mild diffuse bladder wall thickening, likely sequela of enlarged prostate gland. No bladder stones. There is fat within the right inguinal canal. No pelvic free fluid. No pelvic adenopathy.  There are no acute or suspicious osseous abnormalities. There is multilevel degenerative change throughout the lumbar spine. Bilateral L4 pars interarticularis defects without listhesis.  IMPRESSION: 1. Obstructing 7 mm stone in the left proximal ureter with resultant moderate hydroureteronephrosis and mild perinephric stranding. Additional  nonobstructing stone in the upper left kidney. 2. Mild bladder wall thickening, suspect related to chronic bladder outlet obstruction related to enlarged prostate gland. 3. Chronic findings include diverticulosis without diverticulitis, hepatic and renal cysts, and degenerative change in the spine.   Electronically Signed   By: Jeb Levering M.D.   On: 12/29/2014 00:33     EKG Interpretation None      MDM   Final diagnoses:  Acute left lower quadrant pain  Left ureteral stone  Ureteral stone with hydronephrosis  Renal insufficiency    New Prescriptions   ONDANSETRON (ZOFRAN) 4 MG TABLET    Take 1 tablet (4 mg total) by mouth every 8 (eight) hours as  needed for nausea or vomiting.   OXYCODONE-ACETAMINOPHEN (PERCOCET/ROXICET) 5-325 MG PER TABLET    Take 1-2 tablets by mouth every 4 (four) hours as needed for severe pain.   OXYCODONE-ACETAMINOPHEN (PERCOCET/ROXICET) 5-325 MG PER TABLET    Take 1-2 tablets by mouth every 4 (four) hours as needed for severe pain.   TAMSULOSIN (FLOMAX) 0.4 MG CAPS CAPSULE    Take 1 po QD until you pass the stone.    Plan discharge  Rolland Porter, MD, Barbette Or, MD 12/29/14 [phone]

## 2014-12-28 NOTE — ED Notes (Signed)
Pt c/o right sided abdominal pain that started suddenly; pt states he has been vomiting; last BM today

## 2014-12-29 ENCOUNTER — Other Ambulatory Visit: Payer: Self-pay | Admitting: Urology

## 2014-12-29 ENCOUNTER — Ambulatory Visit (HOSPITAL_COMMUNITY)
Admission: RE | Admit: 2014-12-29 | Discharge: 2014-12-29 | Disposition: A | Discharge status: Home / Self Care | Payer: Medicare Other | Source: Ambulatory Visit | Attending: Urology | Admitting: Urology

## 2014-12-29 ENCOUNTER — Ambulatory Visit (INDEPENDENT_AMBULATORY_CARE_PROVIDER_SITE_OTHER): Payer: Medicare Other | Admitting: Urology

## 2014-12-29 ENCOUNTER — Encounter (HOSPITAL_COMMUNITY): Payer: Self-pay | Admitting: *Deleted

## 2014-12-29 DIAGNOSIS — N201 Calculus of ureter: Secondary | ICD-10-CM

## 2014-12-29 DIAGNOSIS — N200 Calculus of kidney: Secondary | ICD-10-CM | POA: Diagnosis not present

## 2014-12-29 DIAGNOSIS — N1339 Other hydronephrosis: Secondary | ICD-10-CM | POA: Diagnosis not present

## 2014-12-29 LAB — URINE MICROSCOPIC-ADD ON

## 2014-12-29 LAB — URINALYSIS, ROUTINE W REFLEX MICROSCOPIC
BILIRUBIN URINE: NEGATIVE
GLUCOSE, UA: NEGATIVE mg/dL
KETONES UR: 15 mg/dL — AB
Leukocytes, UA: NEGATIVE
NITRITE: NEGATIVE
PH: 5 (ref 5.0–8.0)
Urobilinogen, UA: 0.2 mg/dL (ref 0.0–1.0)

## 2014-12-29 MED ORDER — TAMSULOSIN HCL 0.4 MG PO CAPS: ORAL_CAPSULE | ORAL | Status: DC

## 2014-12-29 MED ORDER — OXYCODONE-ACETAMINOPHEN 5-325 MG PO TABS: 1.0000 | ORAL_TABLET | ORAL | Status: DC | PRN

## 2014-12-29 MED ORDER — FENTANYL CITRATE (PF) 100 MCG/2ML IJ SOLN
50.0000 ug | Freq: Once | INTRAMUSCULAR | Status: AC
  Administered 2014-12-29: 50 ug via INTRAVENOUS
  Filled 2014-12-29: qty 2

## 2014-12-29 MED ORDER — ONDANSETRON HCL 4 MG PO TABS: 4.0000 mg | ORAL_TABLET | Freq: Three times a day (TID) | ORAL | Status: DC | PRN

## 2014-12-29 NOTE — Progress Notes (Signed)
Called requested release of orders in Epic to sign and held for Same day surgery 12-30-14 Thanks

## 2014-12-29 NOTE — H&P (Signed)
Urology History and Physical Exam  CC: Left sided kidney stone  HPI: 69 year old male presents for ureteroscopic management of a 7 mm left proximal ureteral stone. He presented to our [place] office with the symptomatic stone.  His initial note is below:   This 69 year old male presents urgently for follow-up of a left ureteral stone.  He became symptomatic yesterday and presented to the emergency room late last night with left flank pain nausea and vomiting.  He denies prior history of.  Treated emergency room, CT revealed a left upper ureteral stone.  He's been fairly comfortable after receiving fentanyl twice.  He's had no fever.  He has no gross hematuria but apparently was noted to have microscopic hematuria.  He is not currently on blood thinners.  He does have a history of SVT and uses when necessary medicine for this.  He denies prior surgical management of any issues.  PMH: Past Medical History  Diagnosis Date  . Hypertension   . Eczema   . Psoriasis   . Hearing loss     high frequency  . Cataract of both eyes   . Erectile dysfunction   . Obesity   . S/P appendectomy   . History of inguinal hernia repair, bilateral   . S/P tonsillectomy   . Loss of hearing     high frequency  . Paroxysmal SVT (supraventricular tachycardia)   . Type II or unspecified type diabetes mellitus without mention of complication, uncontrolled     12-29-14 Told he was prediabetic once    PSH: Past Surgical History  Procedure Laterality Date  . Appendectomy    . Tonsillectomy    . Eye surgery      Catracts removed both eye 30 yrs ago  . Hernia repair      right and left    Allergies: Allergies  Allergen Reactions  . Peanut-Containing Drug Products   . Penicillins Hives    Medications: No prescriptions prior to admission     Social History: History   Social History  . Marital Status: Married    Spouse Name: N/A  . Number of Children: N/A  . Years of Education: N/A    Occupational History  . Not on file.   Social History Main Topics  . Smoking status: Former Research scientist (life sciences)  . Smokeless tobacco: Never Used     Comment: Quit 20-30 years ago  . Alcohol Use: 1.8 oz/week    1 Glasses of wine, 1 Cans of beer, 1 Shots of liquor per week     Comment: occasional  . Drug Use: No  . Sexual Activity: Not on file   Other Topics Concern  . Not on file   Social History Narrative   Daily Caffeine use: 5-6 drinks daily   Exercising 2 times per week   No Drug use             Family History: Family History  Problem Relation Age of Onset  . Cancer Mother     lung  . Diabetes Father   . Thyroid disease Mother   . Lung cancer Mother   . Colon cancer Neg Hx     Review of Systems: Positive: Left flank pain, nausea, emesis. Negative:  A further 10 point review of systems was negative except what is listed in the HPI.                  Physical Exam: @VITALS2 @ General: No acute distress.  Awake. Head:  Normocephalic.  Atraumatic. ENT:  EOMI.  Mucous membranes moist Neck:  Supple.  No lymphadenopathy. CV:  S1 present. S2 present. Regular rate. Pulmonary: Equal effort bilaterally.  Clear to auscultation bilaterally. Abdomen: Soft.  + LLQ,LUQ, LCVAT. Skin:  Normal turgor.  No visible rash. Extremity: No gross deformity of bilateral upper extremities.  No gross deformity of                             lower extremities. Neurologic: Alert. Appropriate mood.    Studies:  Recent Labs     12/28/14  2240  HGB  14.7  WBC  11.5*  PLT  189    Recent Labs     12/28/14  2240  NA  138  K  3.9  CL  106  CO2  25  BUN  24*  CREATININE  1.30*  CALCIUM  8.5*  GFRNONAA  54*  GFRAA  >60     No results for input(s): INR, APTT in the last 72 hours.  Invalid input(s): PT   Invalid input(s): ABG    Assessment:  Left upper ureteral stone  Plan: Left ureteroscopy w/ laser/extraction of stone

## 2014-12-29 NOTE — Discharge Instructions (Signed)
Drink plenty of fluids. Take the medications as prescribed. Call [facility] this morning to get an appointment to be seen later today. You have a "Obstructing 7 mm stone in the left proximal ureter with resultant moderate hydroureteronephrosis". You are supposed to be able to get an appointment to be seen the same day with a kidney stone.  If you should develop a fever, or have uncontrolled vomiting or pain, go to [facility] Emergency Department to be evaluated. The Urologists do all their work in [facility].     Ureteral Colic (Kidney Stones) Ureteral colic is the result of a condition when kidney stones form inside the kidney. Once kidney stones are formed they may move into the tube that connects the kidney with the bladder (ureter). If this occurs, this condition may cause pain (colic) in the ureter.  CAUSES  Pain is caused by stone movement in the ureter and the obstruction caused by the stone. SYMPTOMS  The pain comes and goes as the ureter contracts around the stone. The pain is usually intense, sharp, and stabbing in character. The location of the pain may move as the stone moves through the ureter. When the stone is near the kidney the pain is usually located in the back and radiates to the belly (abdomen). When the stone is ready to pass into the bladder the pain is often located in the lower abdomen on the side the stone is located. At this location, the symptoms may mimic those of a urinary tract infection with urinary frequency. Once the stone is located here it often passes into the bladder and the pain disappears completely. TREATMENT   Your caregiver will provide you with medicine for pain relief.  You may require specialized follow-up X-rays.  The absence of pain does not always mean that the stone has passed. It may have just stopped moving. If the urine remains completely obstructed, it can cause loss of kidney function or even complete destruction of the involved  kidney. It is your responsibility and in your interest that X-rays and follow-ups as suggested by your caregiver are completed. Relief of pain without passage of the stone can be associated with severe damage to the kidney, including loss of kidney function on that side.  If your stone does not pass on its own, additional measures may be taken by your caregiver to ensure its removal. HOME CARE INSTRUCTIONS   Increase your fluid intake. Water is the preferred fluid since juices containing vitamin C may acidify the urine making it less likely for certain stones (uric acid stones) to pass.  Strain all urine. A strainer will be provided. Keep all particulate matter or stones for your caregiver to inspect.  Take your pain medicine as directed.  Make a follow-up appointment with your caregiver as directed.  Remember that the goal is passage of your stone. The absence of pain does not mean the stone is gone. Follow your caregiver's instructions.  Only take over-the-counter or prescription medicines for pain, discomfort, or fever as directed by your caregiver. SEEK MEDICAL CARE IF:   Pain cannot be controlled with the prescribed medicine.  You have a fever.  Pain continues for longer than your caregiver advises it should.  There is a change in the pain, and you develop chest discomfort or constant abdominal pain.  You feel faint or pass out. MAKE SURE YOU:   Understand these instructions.  Will watch your condition.  Will get help right away if you are  not doing well or get worse. Document Released: 04/19/2005 Document Revised: 11/04/2012 Document Reviewed: 01/04/2011 [facility] Patient Information 2015 [place], [place]. This information is not intended to replace advice given to you by your health care provider. Make sure you discuss any questions you have with your health care provider. Kidney Stones Kidney stones (urolithiasis) are deposits that form inside your kidneys. The intense pain  is caused by the stone moving through the urinary tract. When the stone moves, the ureter goes into spasm around the stone. The stone is usually passed in the urine.  CAUSES   A disorder that makes certain neck glands produce too much parathyroid hormone (primary hyperparathyroidism).  A buildup of uric acid crystals, similar to gout in your joints.  Narrowing (stricture) of the ureter.  A kidney obstruction present at birth (congenital obstruction).  Previous surgery on the kidney or ureters.  Numerous kidney infections. SYMPTOMS   Feeling sick to your stomach (nauseous).  Throwing up (vomiting).  Blood in the urine (hematuria).  Pain that usually spreads (radiates) to the groin.  Frequency or urgency of urination. DIAGNOSIS   Taking a history and physical exam.  Blood or urine tests.  CT scan.  Occasionally, an examination of the inside of the urinary bladder (cystoscopy) is performed. TREATMENT   Observation.  Increasing your fluid intake.  Extracorporeal shock wave lithotripsy--This is a noninvasive procedure that uses shock waves to break up kidney stones.  Surgery may be needed if you have severe pain or persistent obstruction. There are various surgical procedures. Most of the procedures are performed with the use of small instruments. Only small incisions are needed to accommodate these instruments, so recovery time is minimized. The size, location, and chemical composition are all important variables that will determine the proper choice of action for you. Talk to your health care provider to better understand your situation so that you will minimize the risk of injury to yourself and your kidney.  HOME CARE INSTRUCTIONS   Drink enough water and fluids to keep your urine clear or pale yellow. This will help you to pass the stone or stone fragments.  Strain all urine through the provided strainer. Keep all particulate matter and stones for your health care  provider to see. The stone causing the pain may be as small as a grain of salt. It is very important to use the strainer each and every time you pass your urine. The collection of your stone will allow your health care provider to analyze it and verify that a stone has actually passed. The stone analysis will often identify what you can do to reduce the incidence of recurrences.  Only take over-the-counter or prescription medicines for pain, discomfort, or fever as directed by your health care provider.  Make a follow-up appointment with your health care provider as directed.  Get follow-up X-rays if required. The absence of pain does not always mean that the stone has passed. It may have only stopped moving. If the urine remains completely obstructed, it can cause loss of kidney function or even complete destruction of the kidney. It is your responsibility to make sure X-rays and follow-ups are completed. Ultrasounds of the kidney can show blockages and the status of the kidney. Ultrasounds are not associated with any radiation and can be performed easily in a matter of minutes. SEEK MEDICAL CARE IF:  You experience pain that is progressive and unresponsive to any pain medicine you have been prescribed. SEEK IMMEDIATE MEDICAL CARE IF:  Pain cannot be controlled with the prescribed medicine.  You have a fever or shaking chills.  The severity or intensity of pain increases over 18 hours and is not relieved by pain medicine.  You develop a new onset of abdominal pain.  You feel faint or pass out.  You are unable to urinate. MAKE SURE YOU:   Understand these instructions.  Will watch your condition.  Will get help right away if you are not doing well or get worse. Document Released: 07/10/2005 Document Revised: 03/12/2013 Document Reviewed: 12/11/2012 [facility] Patient Information 2015 [place], [place]. This information is not intended to replace advice given to you by your health care  provider. Make sure you discuss any questions you have with your health care provider.  Lithotripsy for Kidney Stones Lithotripsy is a treatment that can sometimes help eliminate kidney stones and pain that they cause. A form of lithotripsy, also known as extracorporeal shock wave lithotripsy, is a nonsurgical procedure that helps your body rid itself of the kidney stone when it is too big to pass on its own. Extracorporeal shock wave lithotripsy is a method of crushing a kidney stone with shock waves. These shock waves pass through your body and are focused on your stone. They cause the kidney stones to crumble while still in the urinary tract. It is then easier for the smaller pieces of stone to pass in the urine. Lithotripsy usually takes about an hour. It is done in a hospital, a lithotripsy center, or a mobile unit. It usually does not require an overnight stay. Your health care provider will instruct you on preparation for the procedure. Your health care provider will tell you what to expect afterward. LET [facility] CARE PROVIDER KNOW ABOUT:  Any allergies you have.  All medicines you are taking, including vitamins, herbs, eye drops, creams, and over-the-counter medicines.  Previous problems you or members of your family have had with the use of anesthetics.  Any blood disorders you have.  Previous surgeries you have had.  Medical conditions you have. RISKS AND COMPLICATIONS Generally, lithotripsy for kidney stones is a safe procedure. However, as with any procedure, complications can occur. Possible complications include:  Infection.  Bleeding of the kidney.  Bruising of the kidney or skin.  Obstruction of the ureter.  Failure of the stone to fragment. BEFORE THE PROCEDURE  Do not eat or drink for 6-8 hours prior to the procedure. You may, however, take the medications with a sip of water that your physician instructs you to take  Do not take aspirin or aspirin-containing  products for 7 days prior to your procedure  Do not take nonsteroidal anti-inflammatory products for 7 days prior to your procedure PROCEDURE A stent (flexible tube with holes) may be placed in your ureter. The ureter is the tube that transports the urine from the kidneys to the bladder. Your health care provider may place a stent before the procedure. This will help keep urine flowing from the kidney if the fragments of the stone block the ureter. You may have an IV tube placed in one of your veins to give you fluids and medicines. These medicines may help you relax or make you sleep. During the procedure, you will lie comfortably on a fluid-filled cushion or in a warm-water bath. After an X-ray or ultrasound exam to locate your stone, shock waves are aimed at the stone. If you are awake, you may feel a tapping sensation as the shock waves pass through your body.  If large stone particles remain after treatment, a second procedure may be necessary at a later date. For comfort during the test:  Relax as much as possible.  Try to remain still as much as possible.  Try to follow instructions to speed up the test.  Let your health care provider know if you are uncomfortable, anxious, or in pain. AFTER THE PROCEDURE  After surgery, you will be taken to the recovery area. A nurse will watch and check your progress. Once you're awake, stable, and taking fluids well, you will be allowed to go home as long as there are no problems. You will also be allowed to pass your urine before discharge.You may be given antibiotics to help prevent infection. You may also be prescribed pain medicine if needed. In a week or two, your health care provider may remove your stent, if you have one. You may first have an X-ray exam to check on how successful the fragmentation of your stone has been and how much of the stone has passed. Your health care provider will check to see whether or not stone particles remain. SEEK  IMMEDIATE MEDICAL CARE IF:  You develop a fever or shaking chills.  Your pain is not relieved by medicine.  You feel sick to your stomach (nauseated) and you vomit.  You develop heavy bleeding.  You have difficulty urinating.  You start to pass your stent from your penis. Document Released: 07/07/2000 Document Revised: 04/30/2013 Document Reviewed: 01/23/2013 [facility] Patient Information 2015 [place], [place]. This information is not intended to replace advice given to you by your health care provider. Make sure you discuss any questions you have with your health care provider.

## 2014-12-30 ENCOUNTER — Encounter (HOSPITAL_COMMUNITY)
Admission: RE | Disposition: A | Discharge status: Home / Self Care | Payer: Self-pay | Source: Ambulatory Visit | Attending: Urology

## 2014-12-30 ENCOUNTER — Ambulatory Visit (HOSPITAL_COMMUNITY)
Admission: RE | Admit: 2014-12-30 | Discharge: 2014-12-30 | Disposition: A | Discharge status: Home / Self Care | Payer: Medicare Other | Source: Ambulatory Visit | Attending: Urology | Admitting: Urology

## 2014-12-30 ENCOUNTER — Encounter (HOSPITAL_COMMUNITY): Payer: Self-pay | Admitting: *Deleted

## 2014-12-30 ENCOUNTER — Ambulatory Visit (HOSPITAL_COMMUNITY): Payer: Medicare Other

## 2014-12-30 ENCOUNTER — Ambulatory Visit (HOSPITAL_COMMUNITY): Payer: Medicare Other | Admitting: Anesthesiology

## 2014-12-30 DIAGNOSIS — N201 Calculus of ureter: Secondary | ICD-10-CM

## 2014-12-30 DIAGNOSIS — Z87891 Personal history of nicotine dependence: Secondary | ICD-10-CM | POA: Diagnosis not present

## 2014-12-30 DIAGNOSIS — E669 Obesity, unspecified: Secondary | ICD-10-CM | POA: Insufficient documentation

## 2014-12-30 DIAGNOSIS — Z6831 Body mass index (BMI) 31.0-31.9, adult: Secondary | ICD-10-CM | POA: Insufficient documentation

## 2014-12-30 DIAGNOSIS — N202 Calculus of kidney with calculus of ureter: Secondary | ICD-10-CM | POA: Diagnosis not present

## 2014-12-30 DIAGNOSIS — E1165 Type 2 diabetes mellitus with hyperglycemia: Secondary | ICD-10-CM | POA: Insufficient documentation

## 2014-12-30 DIAGNOSIS — N400 Enlarged prostate without lower urinary tract symptoms: Secondary | ICD-10-CM | POA: Diagnosis not present

## 2014-12-30 DIAGNOSIS — I10 Essential (primary) hypertension: Secondary | ICD-10-CM | POA: Diagnosis not present

## 2014-12-30 DIAGNOSIS — H9190 Unspecified hearing loss, unspecified ear: Secondary | ICD-10-CM | POA: Insufficient documentation

## 2014-12-30 HISTORY — PX: CYSTOSCOPY WITH RETROGRADE PYELOGRAM, URETEROSCOPY AND STENT PLACEMENT: SHX5789

## 2014-12-30 HISTORY — PX: HOLMIUM LASER APPLICATION: SHX5852

## 2014-12-30 LAB — GLUCOSE, CAPILLARY
GLUCOSE-CAPILLARY: 134 mg/dL — AB (ref 65–99)
Glucose-Capillary: 119 mg/dL — ABNORMAL HIGH (ref 65–99)

## 2014-12-30 SURGERY — CYSTOURETEROSCOPY, WITH RETROGRADE PYELOGRAM AND STENT INSERTION
Anesthesia: General | Laterality: Left

## 2014-12-30 MED ORDER — IOHEXOL 300 MG/ML  SOLN
INTRAMUSCULAR | Status: DC | PRN
  Administered 2014-12-30: 8 mL via ORAL

## 2014-12-30 MED ORDER — PROPOFOL 10 MG/ML IV BOLUS
INTRAVENOUS | Status: AC
  Filled 2014-12-30: qty 20

## 2014-12-30 MED ORDER — CIPROFLOXACIN IN D5W 400 MG/200ML IV SOLN
400.0000 mg | Freq: Once | INTRAVENOUS | Status: AC
  Administered 2014-12-30: 400 mg via INTRAVENOUS

## 2014-12-30 MED ORDER — FENTANYL CITRATE (PF) 100 MCG/2ML IJ SOLN
INTRAMUSCULAR | Status: AC
  Filled 2014-12-30: qty 2

## 2014-12-30 MED ORDER — SODIUM CHLORIDE 0.9 % IJ SOLN
INTRAMUSCULAR | Status: AC
  Filled 2014-12-30: qty 10

## 2014-12-30 MED ORDER — PROPOFOL 10 MG/ML IV BOLUS
INTRAVENOUS | Status: DC | PRN
  Administered 2014-12-30: 180 mg via INTRAVENOUS

## 2014-12-30 MED ORDER — FENTANYL CITRATE (PF) 100 MCG/2ML IJ SOLN: 25.0000 ug | INTRAMUSCULAR | Status: DC | PRN

## 2014-12-30 MED ORDER — EPHEDRINE SULFATE 50 MG/ML IJ SOLN
INTRAMUSCULAR | Status: DC | PRN
  Administered 2014-12-30 (×3): 5 mg via INTRAVENOUS

## 2014-12-30 MED ORDER — OXYBUTYNIN CHLORIDE 5 MG PO TABS
5.0000 mg | ORAL_TABLET | Freq: Four times a day (QID) | ORAL | Status: DC
  Administered 2014-12-30: 5 mg via ORAL
  Filled 2014-12-30: qty 1

## 2014-12-30 MED ORDER — CIPROFLOXACIN HCL 250 MG PO TABS: 250.0000 mg | ORAL_TABLET | Freq: Two times a day (BID) | ORAL | Status: DC

## 2014-12-30 MED ORDER — OXYCODONE HCL 5 MG PO TABS
5.0000 mg | ORAL_TABLET | Freq: Once | ORAL | Status: AC | PRN
  Administered 2014-12-30: 5 mg via ORAL
  Filled 2014-12-30: qty 1

## 2014-12-30 MED ORDER — ONDANSETRON HCL 4 MG/2ML IJ SOLN
INTRAMUSCULAR | Status: AC
  Filled 2014-12-30: qty 2

## 2014-12-30 MED ORDER — EPHEDRINE SULFATE 50 MG/ML IJ SOLN
INTRAMUSCULAR | Status: AC
  Filled 2014-12-30: qty 1

## 2014-12-30 MED ORDER — OXYBUTYNIN CHLORIDE 5 MG PO TABS: 5.0000 mg | ORAL_TABLET | Freq: Three times a day (TID) | ORAL | Status: DC | PRN

## 2014-12-30 MED ORDER — ONDANSETRON HCL 4 MG/2ML IJ SOLN
INTRAMUSCULAR | Status: DC | PRN
  Administered 2014-12-30: 4 mg via INTRAVENOUS

## 2014-12-30 MED ORDER — OXYCODONE HCL 5 MG/5ML PO SOLN
5.0000 mg | Freq: Once | ORAL | Status: AC | PRN
  Filled 2014-12-30: qty 5

## 2014-12-30 MED ORDER — CIPROFLOXACIN IN D5W 400 MG/200ML IV SOLN
INTRAVENOUS | Status: AC
  Filled 2014-12-30: qty 200

## 2014-12-30 MED ORDER — LIDOCAINE HCL (CARDIAC) 20 MG/ML IV SOLN
INTRAVENOUS | Status: AC
  Filled 2014-12-30: qty 5

## 2014-12-30 MED ORDER — LACTATED RINGERS IV SOLN
INTRAVENOUS | Status: DC | PRN
Start: 2014-12-30 — End: 2014-12-30
  Administered 2014-12-30: 08:00:00 via INTRAVENOUS

## 2014-12-30 MED ORDER — FENTANYL CITRATE (PF) 100 MCG/2ML IJ SOLN
INTRAMUSCULAR | Status: DC | PRN
  Administered 2014-12-30: 25 ug via INTRAVENOUS
  Administered 2014-12-30: 50 ug via INTRAVENOUS
  Administered 2014-12-30: 25 ug via INTRAVENOUS

## 2014-12-30 MED ORDER — LIDOCAINE HCL (CARDIAC) 20 MG/ML IV SOLN
INTRAVENOUS | Status: DC | PRN
  Administered 2014-12-30: 50 mg via INTRAVENOUS

## 2014-12-30 SURGICAL SUPPLY — 22 items
BAG URO CATCHER STRL LF (DRAPE) ×3 IMPLANT
CATH INTERMIT  6FR 70CM (CATHETERS) ×3 IMPLANT
CLOTH BEACON ORANGE TIMEOUT ST (SAFETY) ×3 IMPLANT
EXTRACTOR STONE NITINOL NGAGE (UROLOGICAL SUPPLIES) ×2 IMPLANT
FIBER LASER FLEXIVA 1000 (UROLOGICAL SUPPLIES) IMPLANT
FIBER LASER FLEXIVA 200 (UROLOGICAL SUPPLIES) ×2 IMPLANT
FIBER LASER FLEXIVA 365 (UROLOGICAL SUPPLIES) IMPLANT
FIBER LASER FLEXIVA 550 (UROLOGICAL SUPPLIES) IMPLANT
FIBER LASER TRAC TIP (UROLOGICAL SUPPLIES) ×2 IMPLANT
GLOVE BIOGEL M 8.0 STRL (GLOVE) ×3 IMPLANT
GOWN STRL REUS W/TWL XL LVL3 (GOWN DISPOSABLE) ×3 IMPLANT
GUIDEWIRE ANG ZIPWIRE 038X150 (WIRE) ×2 IMPLANT
GUIDEWIRE STR DUAL SENSOR (WIRE) ×3 IMPLANT
KIT BALLIN UROMAX 15FX10 (LABEL) ×1 IMPLANT
MANIFOLD NEPTUNE II (INSTRUMENTS) ×3 IMPLANT
PACK CYSTO (CUSTOM PROCEDURE TRAY) ×3 IMPLANT
SET HIGH PRES BAL DIL (LABEL) ×2
SHEATH ACCESS URETERAL 38CM (SHEATH) ×3 IMPLANT
STENT CONTOUR 6FRX26X.038 (STENTS) ×2 IMPLANT
TUBING CONNECTING 10 (TUBING) ×2 IMPLANT
TUBING CONNECTING 10' (TUBING) ×1
WIRE COONS/BENSON .038X145CM (WIRE) IMPLANT

## 2014-12-30 NOTE — Anesthesia Postprocedure Evaluation (Signed)
  Anesthesia Post-op Note  Patient: Daniel Guerrero  Procedure(s) Performed: Procedure(s): CYSTOSCOPY WITH LEFT URETEROSCOPY STONE EXTRACTION (Left) HOLMIUM LASER APPLICATION (Left)  Patient Location: PACU  Anesthesia Type:General  Level of Consciousness: awake  Airway and Oxygen Therapy: Patient Spontanous Breathing  Post-op Pain: mild  Post-op Assessment: Post-op Vital signs reviewed, Patient's Cardiovascular Status Stable, Respiratory Function Stable, Patent Airway, No signs of Nausea or vomiting and Pain level controlled              Post-op Vital Signs: Reviewed and stable  Last Vitals:  Filed Vitals:   12/30/14 1321  BP: 134/71  Pulse: 62  Temp:   Resp: 16    Complications: No apparent anesthesia complications

## 2014-12-30 NOTE — Progress Notes (Signed)
After patient returned from [facility] his foley fell out. Balloon is deflated.   Balloon apparently had a leak in it. Another #18 foley inserted without difficulty and 275cc bloody urine drained. Connected to leg bag . Patient tolerated well.

## 2014-12-30 NOTE — Anesthesia Procedure Notes (Signed)
Procedure Name: LMA Insertion Date/Time: 12/30/2014 8:37 AM Performed by: Glory Buff Pre-anesthesia Checklist: Patient identified, Emergency Drugs available, Suction available, Patient being monitored and Timeout performed Patient Re-evaluated:Patient Re-evaluated prior to inductionOxygen Delivery Method: Circle system utilized Preoxygenation: Pre-oxygenation with 100% oxygen Intubation Type: IV induction LMA: LMA inserted LMA Size: 4.0 Number of attempts: 1 Placement Confirmation: positive ETCO2 Tube secured with: Tape

## 2014-12-30 NOTE — Transfer of Care (Signed)
Immediate Anesthesia Transfer of Care Note  Patient: Daniel Guerrero  Procedure(s) Performed: Procedure(s): CYSTOSCOPY WITH LEFT URETEROSCOPY STONE EXTRACTION (Left) HOLMIUM LASER APPLICATION (Left)  Patient Location: PACU  Anesthesia Type:General  Level of Consciousness: awake, alert  and oriented  Airway & Oxygen Therapy: Patient Spontanous Breathing and Patient connected to face mask oxygen  Post-op Assessment: Report given to RN and Post -op Vital signs reviewed and stable  Post vital signs: Reviewed and stable  Last Vitals:  Filed Vitals:   12/30/14 0607  BP: 127/86  Pulse: 51  Temp: 36.5 C  Resp: 18    Complications: No apparent anesthesia complications

## 2014-12-30 NOTE — Anesthesia Preprocedure Evaluation (Addendum)
Anesthesia Evaluation  Patient identified by MRN, date of birth, ID band Patient awake    Reviewed: Allergy & Precautions, NPO status , Patient's Chart, lab work & pertinent test results, reviewed documented beta blocker date and time   History of Anesthesia Complications Negative for: history of anesthetic complications  Airway Mallampati: II  TM Distance: >3 FB Neck ROM: Full    Dental  (+) Teeth Intact,    Pulmonary neg shortness of breath, neg sleep apnea, neg COPDneg recent URI, former smoker,  breath sounds clear to auscultation        Cardiovascular hypertension, + dysrhythmias Supra Ventricular Tachycardia Rhythm:Regular     Neuro/Psych negative neurological ROS  negative psych ROS   GI/Hepatic negative GI ROS, Neg liver ROS,   Endo/Other  negative endocrine ROSdiabetes  Renal/GU negative Renal ROS     Musculoskeletal   Abdominal   Peds  Hematology negative hematology ROS (+)   Anesthesia Other Findings   Reproductive/Obstetrics                            Anesthesia Physical Anesthesia Plan  ASA: II  Anesthesia Plan: General   Post-op Pain Management:    Induction: Intravenous  Airway Management Planned: LMA  Additional Equipment: None  Intra-op Plan:   Post-operative Plan: Extubation in OR  Informed Consent: I have reviewed the patients History and Physical, chart, labs and discussed the procedure including the risks, benefits and alternatives for the proposed anesthesia with the patient or authorized representative who has indicated his/her understanding and acceptance.     Plan Discussed with: CRNA and Surgeon  Anesthesia Plan Comments:         Anesthesia Quick Evaluation

## 2014-12-30 NOTE — Progress Notes (Signed)
#  18 foley inserted and connected to leg bag without problems Stent remains in place. Draining bloody urine. Patient and wife educated about the catheter and catheter care. They verbalize understanding

## 2014-12-30 NOTE — Op Note (Signed)
Preoperative diagnosis: 7 mm left mid ureteral calculus  Postoperative diagnosis: Same, but with instrumentation descended up being a left renal calculus  Procedure:  1. Cystoscopy 2. Left ureteroscopy and stone removal 3. Ureteroscopic laser lithotripsy 4. Left ureteral stent placement (26 cm x 6 French contour with tether) 5. Left retrograde pyelography with interpretation  Surgeon: Lillette Boxer. Juliona Vales,  M.D.  Anesthesia: General  Complications: None  Intraoperative findings: Left retrograde pyelography demonstrated a filling defect within the left mid ureter consistent with the patient's known calculus without other abnormalities noted.  EBL: Minimal  Specimens: 1. Left renal calculus  Disposition of specimens: [facility] for stone analysis  Indication: Daniel Guerrero  is a 69 y.o. patient with urolithiasis. After reviewing the management options for treatment, he elected to proceed with the above surgical procedure(s). We have discussed the potential benefits and risks of the procedure, side effects of the proposed treatment, the likelihood of the patient achieving the goals of the procedure, and any potential problems that might occur during the procedure or recuperation. Informed consent has been obtained.  Description of procedure:  The patient was taken to the operating room and general anesthesia was induced.  The patient was placed in the dorsal lithotomy position, prepped and draped in the usual sterile fashion, and preoperative antibiotics were administered. A preoperative time-out was performed.   Cystourethroscopy was performed.  The patient's urethra was examined and was normal/ demonstrated bilobar prostatic hypertrophy/ bilobar prostatic hypertrophy with a median lobe . The bladder was then circumferentially examined. There was no evidence of tumors, trabeculations or foreign bodies. There was some what difficult to identify the ureteral orifices,  but with the 70 lens this was then performed. There were multiple small crystals present posteriorly in the bladder consistent with probable uric acid stones  Attention then turned to the left ureteral orifice and a ureteral catheter was used to intubate the ureteral orifice. I utilized a zip wire to help cannulate the orifice. Omnipaque contrast was injected through the ureteral catheter and a retrograde pyelogram was performed with findings as dictated above.  A 0.38 sensor guidewire was then advanced up the left ureter into the renal pelvis under fluoroscopic guidance.  I then dilated the left distal and mid ureter with a 10 cm, 15 French balloon under fluoroscopic guidance. The balloon was then removed, and A 12/14 Fr ureteral access sheath was then advance over the guide wire. The digital flexible ureteroscope was then advanced through the access sheath into the ureter next to the guidewire and the calculus was identified and was located in the posterior calyx of the upper pole   The stone was then fragmented with the 200 micron holmium laser fiber on a setting of 0.3 J and frequency of 15 Hz.   All sizable stones were then removed with a zero tip nitinol basket.  Reinspection of the ureter/renal pelvis revealed no remaining visible stones or fragments of significant size.   The safety wire was then replaced and the access sheath removed.  The guidewire was backloaded through the cystoscope and a ureteral stent was advance over the wire using Seldinger technique.  The stent was positioned appropriately under fluoroscopic and cystoscopic guidance.  The wire was then removed with an adequate stent curl noted in the renal pelvis as well as in the bladder. The string was then brought to the penis and taped to the patient's penis.  The bladder was then emptied and the procedure ended.  The patient  appeared to tolerate the procedure well and without complications.  The patient was able to be awakened and  transferred to the recovery unit in satisfactory condition.

## 2014-12-30 NOTE — Discharge Instructions (Signed)
POSTOPERATIVE CARE AFTER URETEROSCOPY  Stent management  *Stents are often left in after ureteroscopy and stone treatment. If left in, they often cause urinary frequency, urgency, occasional blood in the urine, as well as flank discomfort with urination. These are all expected issues, and should resolve after the stent is removed. *Often times, a small thread is left on the end of the stent, and brought out through the urethra. If so, this is used to remove the stent, making it unnecessary to look in the bladder with a scope in the office to remove the stent. If a thread is left on, did not pull on it until instructed. It is okay to pull the thread to remove the stent on Monday morning.  Diet  Once you have adequately recovered from anesthesia, you may gradually advance your diet, as tolerated, to your regular diet.  Activities  You may gradually increase your activities to your normal unrestricted level the day following your procedure.  Medications  You should resume all preoperative medications. If you are on aspirin-like compounds, you should not resume these until the blood clears from your urine. If given an antibiotic by the surgeon, take these until they are completed. You may also be given, if you have a stent, medications to decrease the urinary frequency and urgency.  Pain  After ureteroscopy, there may be some pain on the side of the scope. Take your pain medicine for this. Usually, this pain resolves within a day or 2.  Fever  Please report any fever over 100 to the doctor.

## 2014-12-31 ENCOUNTER — Encounter (HOSPITAL_COMMUNITY): Payer: Self-pay | Admitting: Urology

## 2015-01-01 ENCOUNTER — Ambulatory Visit (INDEPENDENT_AMBULATORY_CARE_PROVIDER_SITE_OTHER): Payer: Medicare Other | Admitting: Urology

## 2015-01-01 DIAGNOSIS — N201 Calculus of ureter: Secondary | ICD-10-CM

## 2015-01-01 DIAGNOSIS — N281 Cyst of kidney, acquired: Secondary | ICD-10-CM | POA: Diagnosis not present

## 2015-01-06 MED FILL — Oxycodone w/ Acetaminophen Tab 5-325 MG: ORAL | Qty: 6 | Status: AC

## 2015-01-20 ENCOUNTER — Encounter: Payer: Self-pay | Admitting: Family Medicine

## 2015-01-20 ENCOUNTER — Ambulatory Visit (INDEPENDENT_AMBULATORY_CARE_PROVIDER_SITE_OTHER): Payer: Medicare Other | Admitting: Family Medicine

## 2015-01-20 VITALS — BP 142/90 | HR 65 | Temp 97.6°F | Ht 72.0 in | Wt 228.4 lb

## 2015-01-20 DIAGNOSIS — R739 Hyperglycemia, unspecified: Secondary | ICD-10-CM

## 2015-01-20 DIAGNOSIS — L304 Erythema intertrigo: Secondary | ICD-10-CM

## 2015-01-20 DIAGNOSIS — I10 Essential (primary) hypertension: Secondary | ICD-10-CM

## 2015-01-20 DIAGNOSIS — R7309 Other abnormal glucose: Secondary | ICD-10-CM

## 2015-01-20 NOTE — Patient Instructions (Signed)
BEFORE YOU LEAVE: -schedule follow up with Dr. Sherren Mocha in 3 months  Clotrimazole cream (available over the counter) 1-2 times daily for several weeks and wear cool loose cotton clothing  Take Align or culturelle probiotic daily for 1 month  Cut back on starches and sweets (breads, rice, pasta, cakes, cookies, etc) and get regular exercise

## 2015-01-20 NOTE — Progress Notes (Signed)
Pre visit review using our clinic review tool, if applicable. No additional management support is needed unless otherwise documented below in the visit note. 

## 2015-01-20 NOTE — Progress Notes (Signed)
HPI:  SKIN RASH: -in panus fold -itchy and red -he tried triamcinolone cream on this and helped a little -hx eczema and psoriasis -started after several courses of abx -denies rash elsewhere now, fevers, malaise  HTN: -meds: asa, atenolol 25 mg prn for pSVT from PCP but takes irregularly -reports has been on several meds, atenolol made HR too low, lisinopril and other meds made BP too low -reports has not exercised recently due to kidney stone - but when exercises BP is better -denies: CP, SOB, DOE  Elevated blood sugar: -mild on review of labs -denies: polyuria, polydipsia, vision changes  ROS: See pertinent positives and negatives per HPI.  Past Medical History  Diagnosis Date  . Hypertension   . Eczema   . Psoriasis   . Hearing loss     high frequency  . Cataract of both eyes   . Erectile dysfunction   . Obesity   . S/P appendectomy   . History of inguinal hernia repair, bilateral   . S/P tonsillectomy   . Loss of hearing     high frequency  . Paroxysmal SVT (supraventricular tachycardia)   . Type II or unspecified type diabetes mellitus without mention of complication, uncontrolled     12-29-14 Told he was prediabetic once    Past Surgical History  Procedure Laterality Date  . Appendectomy    . Tonsillectomy    . Eye surgery      Catracts removed both eye 30 yrs ago  . Hernia repair      right and left  . Cystoscopy with retrograde pyelogram, ureteroscopy and stent placement Left 12/30/2014    Procedure: CYSTOSCOPY WITH LEFT URETEROSCOPY STONE EXTRACTION WITH STENT;  Surgeon: Franchot Gallo, MD;  Location: [facility];  Service: Urology;  Laterality: Left;  . Holmium laser application Left 10/27/6597    Procedure: HOLMIUM LASER APPLICATION;  Surgeon: Franchot Gallo, MD;  Location: [facility];  Service: Urology;  Laterality: Left;    Family History  Problem Relation Age of Onset  . Cancer Mother     lung  . Diabetes Father   . Thyroid disease Mother   . Lung  cancer Mother   . Colon cancer Neg Hx     History   Social History  . Marital Status: Married    Spouse Name: N/A  . Number of Children: N/A  . Years of Education: N/A   Social History Main Topics  . Smoking status: Former Research scientist (life sciences)  . Smokeless tobacco: Never Used     Comment: Quit 20-30 years ago  . Alcohol Use: 1.8 oz/week    1 Glasses of wine, 1 Cans of beer, 1 Shots of liquor per week     Comment: occasional  . Drug Use: No  . Sexual Activity: Not on file   Other Topics Concern  . None   Social History Narrative   Daily Caffeine use: 5-6 drinks daily   Exercising 2 times per week   No Drug use              Current outpatient prescriptions:  .  aspirin 81 MG chewable tablet, Chew 81 mg by mouth daily., Disp: , Rfl:  .  atenolol (TENORMIN) 25 MG tablet, One half tab every 6 hours when necessary for SVT (Patient taking differently: Take 25 mg by mouth every 6 (six) hours as needed (for SVT). One tab every 6 hours when necessary for SVT), Disp: 50 tablet, Rfl: 1 .  clobetasol ointment (TEMOVATE) 0.05 %,  Apply topically daily. Apply each bedtime, Disp: 60 g, Rfl: 3 .  Cyanocobalamin (VITAMIN B-12 PO), Take 1 tablet by mouth daily., Disp: , Rfl:  .  desonide (DESOWEN) 0.05 % cream, Apply topically 2 (two) times daily. Twice a month for skin rash (Patient taking differently: Apply 1 application topically daily as needed (for skin rash/irritatin). ), Disp: 60 g, Rfl: 3 .  fluticasone (FLONASE) 50 MCG/ACT nasal spray, Place 2 sprays into both nostrils daily., Disp: 16 g, Rfl: 6 .  loratadine (CLARITIN) 10 MG tablet, Take 10 mg by mouth daily., Disp: , Rfl:  .  triamcinolone ointment (KENALOG) 0.1 %, Apply topically 2 (two) times daily. Apply thin layer, Disp: 80 g, Rfl: 3 .  vitamin C (ASCORBIC ACID) 500 MG tablet, Take 500 mg by mouth daily., Disp: , Rfl:   EXAM:  Filed Vitals:   01/20/15 1051  BP: 142/90  Pulse: 65  Temp: 97.6 F (36.4 C)    Body mass index is 30.97  kg/(m^2).  GENERAL: vitals reviewed and listed above, alert, oriented, appears well hydrated and in no acute distress  HEENT: atraumatic, conjunttiva clear, no obvious abnormalities on inspection of external nose and ears  NECK: no obvious masses on inspection  LUNGS: clear to auscultation bilaterally, no wheezes, rales or rhonchi, good air movement  CV: HRRR, no peripheral edema  MS: moves all extremities without noticeable abnormality  PSYCH: pleasant and cooperative, no obvious depression or anxiety  ASSESSMENT AND PLAN:  Discussed the following assessment and plan:  Intertrigo -tx with topical azole -advised stop steroid cream  -follow up as needed  Essential hypertension -advised medication but he prefers to monitor and work on diet and exercise and follow up with PCP  Elevated blood sugar -on ROC, mild, advised dietary adjustments, regular exercise and follow up in 3 months to recheck  -Patient advised to return or notify a doctor immediately if symptoms worsen or persist or new concerns arise.  Patient Instructions  BEFORE YOU LEAVE: -schedule follow up with Dr. Sherren Mocha in 3 months  Clotrimazole cream (available over the counter) 1-2 times daily for several weeks and wear cool loose cotton clothing  Take Align or culturelle probiotic daily for 1 month  Cut back on starches and sweets (breads, rice, pasta, cakes, cookies, etc) and get regular exercise     Daniel Colvard R.

## 2015-02-24 ENCOUNTER — Ambulatory Visit (INDEPENDENT_AMBULATORY_CARE_PROVIDER_SITE_OTHER)
Admission: RE | Admit: 2015-02-24 | Discharge: 2015-02-24 | Disposition: A | Discharge status: Home / Self Care | Payer: Medicare Other | Source: Ambulatory Visit | Attending: Adult Health | Admitting: Adult Health

## 2015-02-24 ENCOUNTER — Ambulatory Visit (INDEPENDENT_AMBULATORY_CARE_PROVIDER_SITE_OTHER): Payer: Medicare Other | Admitting: Adult Health

## 2015-02-24 ENCOUNTER — Telehealth: Payer: Self-pay | Admitting: Adult Health

## 2015-02-24 ENCOUNTER — Encounter: Payer: Self-pay | Admitting: Adult Health

## 2015-02-24 VITALS — BP 130/70 | Temp 97.9°F | Ht 72.0 in | Wt 234.9 lb

## 2015-02-24 DIAGNOSIS — S21101A Unspecified open wound of right front wall of thorax without penetration into thoracic cavity, initial encounter: Secondary | ICD-10-CM

## 2015-02-24 DIAGNOSIS — S299XXA Unspecified injury of thorax, initial encounter: Secondary | ICD-10-CM | POA: Diagnosis not present

## 2015-02-24 MED ORDER — HYDROCODONE-HOMATROPINE 5-1.5 MG/5ML PO SYRP: 5.0000 mL | ORAL_SOLUTION | Freq: Three times a day (TID) | ORAL | Status: DC | PRN

## 2015-02-24 NOTE — Telephone Encounter (Signed)
Updated patient on chest xray. Advised him to keep doing his deep breathing exercises.

## 2015-02-24 NOTE — Progress Notes (Signed)
Pre visit review using our clinic review tool, if applicable. No additional management support is needed unless otherwise documented below in the visit note. 

## 2015-02-24 NOTE — Patient Instructions (Signed)
It was great meeting you today!  I will follow up with you regarding your x-ray.    Use cough medicine at night as it will make you sleepy.   Please, continue to do deep breathing exercises, this will help prevent pneumonia.
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
## 2015-04-22 ENCOUNTER — Ambulatory Visit: Payer: Medicare Other | Admitting: Family Medicine

## 2015-05-20 DIAGNOSIS — H1131 Conjunctival hemorrhage, right eye: Secondary | ICD-10-CM | POA: Diagnosis not present

## 2015-05-25 DIAGNOSIS — Z23 Encounter for immunization: Secondary | ICD-10-CM | POA: Diagnosis not present

## 2015-06-10 ENCOUNTER — Encounter (HOSPITAL_COMMUNITY): Payer: Self-pay | Admitting: Emergency Medicine

## 2015-06-10 ENCOUNTER — Observation Stay (HOSPITAL_COMMUNITY): Payer: Medicare Other

## 2015-06-10 ENCOUNTER — Inpatient Hospital Stay (HOSPITAL_COMMUNITY)
Admission: EM | Admit: 2015-06-10 | Discharge: 2015-06-11 | DRG: 309 | Disposition: A | Discharge status: Home / Self Care | Payer: Medicare Other | Attending: Internal Medicine | Admitting: Internal Medicine

## 2015-06-10 DIAGNOSIS — E119 Type 2 diabetes mellitus without complications: Secondary | ICD-10-CM | POA: Diagnosis present

## 2015-06-10 DIAGNOSIS — I214 Non-ST elevation (NSTEMI) myocardial infarction: Secondary | ICD-10-CM

## 2015-06-10 DIAGNOSIS — I10 Essential (primary) hypertension: Secondary | ICD-10-CM | POA: Diagnosis present

## 2015-06-10 DIAGNOSIS — R079 Chest pain, unspecified: Secondary | ICD-10-CM

## 2015-06-10 DIAGNOSIS — Z801 Family history of malignant neoplasm of trachea, bronchus and lung: Secondary | ICD-10-CM

## 2015-06-10 DIAGNOSIS — I351 Nonrheumatic aortic (valve) insufficiency: Secondary | ICD-10-CM | POA: Diagnosis present

## 2015-06-10 DIAGNOSIS — I471 Supraventricular tachycardia, unspecified: Secondary | ICD-10-CM | POA: Diagnosis present

## 2015-06-10 DIAGNOSIS — Z7982 Long term (current) use of aspirin: Secondary | ICD-10-CM | POA: Diagnosis not present

## 2015-06-10 DIAGNOSIS — R7989 Other specified abnormal findings of blood chemistry: Secondary | ICD-10-CM | POA: Insufficient documentation

## 2015-06-10 DIAGNOSIS — R001 Bradycardia, unspecified: Secondary | ICD-10-CM | POA: Diagnosis not present

## 2015-06-10 DIAGNOSIS — I248 Other forms of acute ischemic heart disease: Secondary | ICD-10-CM | POA: Diagnosis not present

## 2015-06-10 DIAGNOSIS — Z87891 Personal history of nicotine dependence: Secondary | ICD-10-CM | POA: Diagnosis not present

## 2015-06-10 DIAGNOSIS — R748 Abnormal levels of other serum enzymes: Secondary | ICD-10-CM | POA: Diagnosis not present

## 2015-06-10 DIAGNOSIS — J309 Allergic rhinitis, unspecified: Secondary | ICD-10-CM | POA: Diagnosis present

## 2015-06-10 DIAGNOSIS — R0602 Shortness of breath: Secondary | ICD-10-CM

## 2015-06-10 DIAGNOSIS — J3089 Other allergic rhinitis: Secondary | ICD-10-CM | POA: Diagnosis not present

## 2015-06-10 DIAGNOSIS — R778 Other specified abnormalities of plasma proteins: Secondary | ICD-10-CM | POA: Insufficient documentation

## 2015-06-10 DIAGNOSIS — H9190 Unspecified hearing loss, unspecified ear: Secondary | ICD-10-CM | POA: Diagnosis present

## 2015-06-10 DIAGNOSIS — Z833 Family history of diabetes mellitus: Secondary | ICD-10-CM | POA: Diagnosis not present

## 2015-06-10 DIAGNOSIS — I77810 Thoracic aortic ectasia: Secondary | ICD-10-CM | POA: Diagnosis not present

## 2015-06-10 HISTORY — DX: Bradycardia, unspecified: R00.1

## 2015-06-10 LAB — CBC
HEMATOCRIT: 39.9 % (ref 39.0–52.0)
HEMOGLOBIN: 13.8 g/dL (ref 13.0–17.0)
MCH: 32.9 pg (ref 26.0–34.0)
MCHC: 34.6 g/dL (ref 30.0–36.0)
MCV: 95.2 fL (ref 78.0–100.0)
Platelets: 199 10*3/uL (ref 150–400)
RBC: 4.19 MIL/uL — AB (ref 4.22–5.81)
RDW: 12.6 % (ref 11.5–15.5)
WBC: 9.9 10*3/uL (ref 4.0–10.5)

## 2015-06-10 LAB — CBC WITH DIFFERENTIAL/PLATELET
BASOS ABS: 0 10*3/uL (ref 0.0–0.1)
BASOS PCT: 0 %
EOS ABS: 0.3 10*3/uL (ref 0.0–0.7)
Eosinophils Relative: 2 %
HCT: 45.8 % (ref 39.0–52.0)
HEMOGLOBIN: 16.2 g/dL (ref 13.0–17.0)
Lymphocytes Relative: 19 %
Lymphs Abs: 2.5 10*3/uL (ref 0.7–4.0)
MCH: 33.5 pg (ref 26.0–34.0)
MCHC: 35.4 g/dL (ref 30.0–36.0)
MCV: 94.6 fL (ref 78.0–100.0)
Monocytes Absolute: 1.1 10*3/uL — ABNORMAL HIGH (ref 0.1–1.0)
Monocytes Relative: 9 %
NEUTROS PCT: 70 %
Neutro Abs: 9.1 10*3/uL — ABNORMAL HIGH (ref 1.7–7.7)
PLATELETS: 235 10*3/uL (ref 150–400)
RBC: 4.84 MIL/uL (ref 4.22–5.81)
RDW: 12.8 % (ref 11.5–15.5)
WBC: 13.1 10*3/uL — ABNORMAL HIGH (ref 4.0–10.5)

## 2015-06-10 LAB — COMPREHENSIVE METABOLIC PANEL
ALK PHOS: 71 U/L (ref 38–126)
ALT: 24 U/L (ref 17–63)
ALT: 20 U/L (ref 17–63)
AST: 57 U/L — ABNORMAL HIGH (ref 15–41)
AST: 47 U/L — ABNORMAL HIGH (ref 15–41)
Albumin: 4.2 g/dL (ref 3.5–5.0)
Albumin: 3.4 g/dL — ABNORMAL LOW (ref 3.5–5.0)
Alkaline Phosphatase: 61 U/L (ref 38–126)
Anion gap: 9 (ref 5–15)
BUN: 32 mg/dL — ABNORMAL HIGH (ref 6–20)
BUN: 27 mg/dL — ABNORMAL HIGH (ref 6–20)
CALCIUM: 9 mg/dL (ref 8.9–10.3)
CHLORIDE: 106 mmol/L (ref 101–111)
CHLORIDE: 111 mmol/L (ref 101–111)
CO2: 25 mmol/L (ref 22–32)
CO2: 28 mmol/L (ref 22–32)
Calcium: 8.2 mg/dL — ABNORMAL LOW (ref 8.9–10.3)
Creatinine, Ser: 1.19 mg/dL (ref 0.61–1.24)
Creatinine, Ser: 1.15 mg/dL (ref 0.61–1.24)
GFR calc non Af Amer: >60 mL/min (ref >60)
Glucose, Bld: 151 mg/dL — ABNORMAL HIGH (ref 65–99)
Glucose, Bld: 123 mg/dL — ABNORMAL HIGH (ref 65–99)
POTASSIUM: 5 mmol/L (ref 3.5–5.1)
Potassium: 4.6 mmol/L (ref 3.5–5.1)
SODIUM: 141 mmol/L (ref 135–145)
Sodium: 140 mmol/L (ref 135–145)
TOTAL PROTEIN: 7 g/dL (ref 6.5–8.1)
Total Bilirubin: 0.6 mg/dL (ref 0.3–1.2)
Total Bilirubin: 0.9 mg/dL (ref 0.3–1.2)
Total Protein: 5.7 g/dL — ABNORMAL LOW (ref 6.5–8.1)

## 2015-06-10 LAB — TROPONIN I
TROPONIN I: 0.66 ng/mL — AB (ref <0.031)
TROPONIN I: 0.85 ng/mL — AB (ref <0.031)
TROPONIN I: 1.13 ng/mL — AB (ref <0.031)
TROPONIN I: 0.98 ng/mL — AB (ref <0.031)
Troponin I: 1.17 ng/mL (ref <0.031)

## 2015-06-10 LAB — TSH: TSH: 1.572 u[IU]/mL (ref 0.350–4.500)

## 2015-06-10 LAB — MAGNESIUM: MAGNESIUM: 2.2 mg/dL (ref 1.7–2.4)

## 2015-06-10 LAB — HEPARIN LEVEL (UNFRACTIONATED): Heparin Unfractionated: 0.55 IU/mL (ref 0.30–0.70)

## 2015-06-10 MED ORDER — SODIUM CHLORIDE 0.9 % IV BOLUS (SEPSIS)
500.0000 mL | Freq: Once | INTRAVENOUS | Status: AC
  Administered 2015-06-10: 500 mL via INTRAVENOUS

## 2015-06-10 MED ORDER — METOPROLOL TARTRATE 1 MG/ML IV SOLN
5.0000 mg | Freq: Once | INTRAVENOUS | Status: AC
  Administered 2015-06-10: 5 mg via INTRAVENOUS
  Filled 2015-06-10: qty 5

## 2015-06-10 MED ORDER — SODIUM CHLORIDE 0.9 % IV SOLN: INTRAVENOUS | Status: DC

## 2015-06-10 MED ORDER — HEPARIN BOLUS VIA INFUSION
3000.0000 [IU] | Freq: Once | INTRAVENOUS | Status: AC
  Administered 2015-06-10: 3000 [IU] via INTRAVENOUS
  Filled 2015-06-10: qty 3000

## 2015-06-10 MED ORDER — MORPHINE SULFATE (PF) 2 MG/ML IV SOLN: 1.0000 mg | INTRAVENOUS | Status: DC | PRN

## 2015-06-10 MED ORDER — FLUTICASONE PROPIONATE 50 MCG/ACT NA SUSP
2.0000 | Freq: Every day | NASAL | Status: DC
  Administered 2015-06-10: 2 via NASAL
  Filled 2015-06-10: qty 16

## 2015-06-10 MED ORDER — SODIUM CHLORIDE 0.9 % IV BOLUS (SEPSIS)
1000.0000 mL | Freq: Once | INTRAVENOUS | Status: AC
  Administered 2015-06-10: 1000 mL via INTRAVENOUS

## 2015-06-10 MED ORDER — ONDANSETRON HCL 4 MG PO TABS: 4.0000 mg | ORAL_TABLET | Freq: Four times a day (QID) | ORAL | Status: DC | PRN

## 2015-06-10 MED ORDER — HEPARIN (PORCINE) IN NACL 100-0.45 UNIT/ML-% IJ SOLN
1250.0000 [IU]/h | INTRAMUSCULAR | Status: DC
Start: 2015-06-10 — End: 2015-06-11
  Administered 2015-06-10 – 2015-06-11 (×2): 1250 [IU]/h via INTRAVENOUS
  Filled 2015-06-10 (×2): qty 250

## 2015-06-10 MED ORDER — ASPIRIN 81 MG PO CHEW
81.0000 mg | CHEWABLE_TABLET | Freq: Every day | ORAL | Status: DC
  Administered 2015-06-10 – 2015-06-11 (×2): 81 mg via ORAL
  Filled 2015-06-10 (×2): qty 1

## 2015-06-10 MED ORDER — ENOXAPARIN SODIUM 40 MG/0.4ML ~~LOC~~ SOLN
40.0000 mg | SUBCUTANEOUS | Status: DC
  Administered 2015-06-10: 40 mg via SUBCUTANEOUS
  Filled 2015-06-10: qty 0.4

## 2015-06-10 MED ORDER — LORATADINE 10 MG PO TABS
10.0000 mg | ORAL_TABLET | Freq: Every day | ORAL | Status: DC
  Administered 2015-06-10 – 2015-06-11 (×2): 10 mg via ORAL
  Filled 2015-06-10 (×2): qty 1

## 2015-06-10 MED ORDER — ONDANSETRON HCL 4 MG/2ML IJ SOLN: 4.0000 mg | Freq: Four times a day (QID) | INTRAMUSCULAR | Status: DC | PRN

## 2015-06-10 MED ORDER — METOPROLOL TARTRATE 25 MG PO TABS
12.5000 mg | ORAL_TABLET | Freq: Two times a day (BID) | ORAL | Status: DC
  Administered 2015-06-10: 12.5 mg via ORAL
  Filled 2015-06-10 (×3): qty 1

## 2015-06-10 NOTE — Progress Notes (Signed)
Critical Values - Troponin 0.85 - Md informed and advised to closely monitor since pt just left the ED and Md was aware of pt's condition.

## 2015-06-10 NOTE — Consult Note (Signed)
CARDIOLOGY CONSULT NOTE   Patient ID: NEWT APODACA MRN: [id number] DOB/AGE: 10-27-1945 69 y.o.  Admit Date: 06/10/2015 Referring Physician: PTH Primary Physician: Joycelyn Man, MD Consulting Cardiologist: Kate Sable MD Primary Cardiologist: New (Sees Dr. Gerre Scull in [place]) Reason for Consultation: SVT  Clinical Summary Mr. Ricklefs is a very pleasant 69 y.o.male with a history of SVT since the age of 50, who has been followed by Dr. Kelby Fam, cardiologist in [place]. He has been put on atenolol prn, as the SVT is paroxymal. He was in his usual state of health when around noon yesterday he felt his heart racing. 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   He usually doesn't worry about it when HR increases as it goes away on its own after a few minutes. However, this lasted over 12 hours with associated chest pain and fatigue. He took two doses of atenolol in divided doses to see if this would relieved the rapid HR. He also put ice on his face and tried to relax, but did not help. He came to ER. He states that he has episodes of this may once or twice a month and has only had to take atenolol maybe twice a year. His son, a policeman in [place], has had to have ablation as he has had this as well.   On arrival to ER, his HR was elevated at 142 bpm. BP 106/82. Troponin 0.85 and 0.66 respectively. Slightly elevated LFTs. EKG demonstrated SVT, possible ANRT, with biphasic P-waves note in leads V1 and V2. Marland Kitchen He was given IV metoprolol 5 mg. HR returned to NSR with 1st degree AV Block.  He states he feels good and wants to go home. He wishes to change cardiologists to local cardiologist in [place]. He saw the former cardiologist about a year ago. He had a stress test and echo in the past, that he reports was normal.   Allergies  Allergen Reactions    . Peanut-Containing Drug Products Hives  . Penicillins Hives    Medications Scheduled Medications: . aspirin  81 mg Oral Daily  . enoxaparin (LOVENOX) injection  40 mg Subcutaneous Q24H  . fluticasone  2 spray Each Nare Daily  . loratadine  10 mg Oral Daily  . metoprolol tartrate  12.5 mg Oral BID    Infusions: . sodium chloride 10 mL/hr at 06/10/15 0500    PRN Medications: morphine injection, ondansetron **OR** ondansetron (ZOFRAN) IV   Past Medical History  Diagnosis Date  . Hypertension   . Eczema   . Psoriasis   . Hearing loss     high frequency  . Cataract of both eyes   . Erectile dysfunction   . Obesity   . S/P appendectomy   . History of inguinal hernia repair, bilateral   . S/P tonsillectomy   . Loss of hearing     high frequency  . Paroxysmal SVT (supraventricular tachycardia) ([place])   . Type II or unspecified type diabetes mellitus without mention of complication, uncontrolled     12-29-14 Told he was prediabetic once    Past Surgical History  Procedure Laterality Date  . Appendectomy    . Tonsillectomy    . Eye surgery      Catracts removed both eye 30 yrs ago  . Hernia repair      right and left  . Cystoscopy with retrograde  pyelogram, ureteroscopy and stent placement Left 12/30/2014    Procedure: CYSTOSCOPY WITH LEFT URETEROSCOPY STONE EXTRACTION WITH STENT;  Surgeon: Franchot Gallo, MD;  Location: [facility];  Service: Urology;  Laterality: Left;  . Holmium laser application Left [id number]    Procedure: HOLMIUM LASER APPLICATION;  Surgeon: Franchot Gallo, MD;  Location: [facility];  Service: Urology;  Laterality: Left;    Family History  Problem Relation Age of Onset  . Cancer Mother     lung  . Diabetes Father   . Thyroid disease Mother   . Lung cancer Mother   . Colon cancer Neg Hx     Social History Mr. Colavito reports that he has quit smoking. He has never used smokeless tobacco. Mr. Newby reports that he drinks about 1.8 oz of  alcohol per week.  Review of Systems Complete review of systems are found to be negative unless outlined in H&P above.  Physical Examination Blood pressure 103/69, pulse 54, temperature 98.1 F (36.7 C), temperature source Oral, resp. rate 17, height 5\' 8"  (1.727 m), weight 223 lb (101.152 kg), SpO2 97 %.  Intake/Output Summary (Last 24 hours) at 06/10/15 1025 Last data filed at 06/10/15 0357  Gross per 24 hour  Intake    500 ml  Output      0 ml  Net    500 ml    Telemetry: NSR  GEN: No acute distress HEENT: Conjunctiva and lids normal, oropharynx clear with moist mucosa. Neck: Supple, no elevated JVP or carotid bruits, no thyromegaly. Lungs: Clear to auscultation, nonlabored breathing at rest. Cardiac: Regular rate and rhythm, no S3 or significant systolic murmur, no pericardial rub. Abdomen: Soft, nontender, no hepatomegaly, bowel sounds present, no guarding or rebound. Extremities: No pitting edema, distal pulses 2+. Skin: Warm and dry. Musculoskeletal: No kyphosis. Neuropsychiatric: Alert and oriented x3, affect grossly appropriate.  Prior Cardiac Testing/Procedures Done is [place]. Will request records.   Lab Results  Basic Metabolic Panel:  Recent Labs Lab 06/10/15 0143 06/10/15 0702  NA 140 141  K 4.6 5.0  CL 106 111  CO2 25 28  GLUCOSE 151* 123*  BUN 32* 27*  CREATININE 1.19 1.15  CALCIUM 9.0 8.2*  MG 2.2  --     Liver Function Tests:  Recent Labs Lab 06/10/15 0143 06/10/15 0702  AST 57* 47*  ALT 24 20  ALKPHOS 71 61  BILITOT 0.6 0.9  PROT 7.0 5.7*  ALBUMIN 4.2 3.4*    CBC:  Recent Labs Lab 06/10/15 0143 06/10/15 0702  WBC 13.1* 9.9  NEUTROABS 9.1*  --   HGB 16.2 13.8  HCT 45.8 39.9  MCV 94.6 95.2  PLT 235 199    Cardiac Enzymes:  Recent Labs Lab 06/10/15 0143 06/10/15 0341 06/10/15 0703  TROPONINI 0.66* 0.85* 1.17*   ECG: SVT with rate of 142 bpm.  Follow up EKG, NSR with 1st degree AV block.   Impression and  Recommendations  1. SVT: Cannot rule out AVNRT. He has these episodes once or twice a month and has only had to take atenolol once or twice a year. Vagal maneuvers and doses of atenolol taken at home did not resolve until he came to ER. Now in NSR with IV metoprolol. He has had this since he was 69, he states. FH of thyroid disease. Will check TSH.   He was unable to take atenolol daily as this caused bradycardia. Consider tachybrady syndrome. Will have echo completed to evaluate his current status.   2.  NSTEMI: Troponin is trending upward.from 0.66 to 1.17 this am. He is not anemic. Potassium 5.0. He denies recurrent chest pain, but did have pain when he was initially in rapid HR.  CVRF include, age, male, Unknown cholesterol status. Check lipids   3.  Questionable ETOH abuse; Drinks 3 or beers at night, along with a glass or two of wine daily. Echo for evaluation of LV fx. Mildly elevated LFT's   Signed: Phill Myron. Lawrence NP [place]  06/10/2015, 10:25 AM Co-Sign MD  The patient was seen and examined, and I agree with the assessment and plan as documented above, with modifications as noted below. Pt with h/o SVT admitted with SVT x 12 hrs unresponsive to vagal maneuvers and atenolol. Now in sinus rhythm. ECG with nonspecific T wave abnormalities in aVL and V6. Denies h/o exertional chest pain. Has dyspnea when climbing stairs but cannot say that this has gotten any worse over the past year. Not anemic, and I would not expect this degree of troponin elevation with SVT.  Will obtain echocardiogram to evaluate cardiac structure and function. Will start heparin infusion and check additional troponins. If LVEF and regional wall motion are normal and troponins begin to trend down, I would treat medically and plan for an outpatient stress test. If troponins trend up any further and/or there are echocardiographic abnormalities suggestive of ischemic heart disease, would then transfer to [facility]  for coronary angiography.  The patient is in agreement with this plan.    Kate Sable, MD, [facility]  06/10/2015 11:03 AM

## 2015-06-10 NOTE — ED Notes (Signed)
CRITICAL VALUE ALERT  Critical value received:  Troponin 0.66  Date of notification:  06/10/15  Time of notification:  0234 hrs   Critical value read back:Yes.    Nurse who received alert:  Y. Delynn Olvera, RN  MD notified (1st page):  Dr. Tomi Bamberger  Time of first page:  0234 hrs  Responding MD:  Dr. Tomi Bamberger  Time MD responded:  0235 hrs

## 2015-06-10 NOTE — Care Management Note (Signed)
Case Management Note  Patient Details  Name: Daniel Guerrero MRN: [id number] Date of Birth: 07-03-1946  Subjective/Objective:                  Pt admitted from home with CP. Pt lives with his wife and will return home at discharge. Pt is independent with ADL's.  Action/Plan: Pt may require transfer to [facility]. Will continue to follow for discharge planning needs.  Expected Discharge Date:                  Expected Discharge Plan:  Home/Self Care  In-House Referral:  NA  Discharge planning Services  CM Consult  Post Acute Care Choice:  NA Choice offered to:  NA  DME Arranged:    DME Agency:     HH Arranged:    HH Agency:     Status of Service:  In process, will continue to follow  Medicare Important Message Given:    Date Medicare IM Given:    Medicare IM give by:    Date Additional Medicare IM Given:    Additional Medicare Important Message give by:     If discussed at [place] of Stay Meetings, dates discussed:    Additional Comments:  Joylene Draft, RN 06/10/2015, 12:17 PM

## 2015-06-10 NOTE — Progress Notes (Signed)
*  PRELIMINARY RESULTS* Echocardiogram 2D Echocardiogram has been performed.  Leavy Cella 06/10/2015, 2:43 PM

## 2015-06-10 NOTE — ED Notes (Signed)
Pt states his heart has been racing at least 12 hours. Pt also c/o dizziness and sob.

## 2015-06-10 NOTE — ED Provider Notes (Signed)
CSN: [id number]     Arrival date & time 06/10/15  0125 History   First MD Initiated Contact with Patient 06/10/15 0132     Chief Complaint  Patient presents with  . Palpitations     (Consider location/radiation/quality/duration/timing/severity/associated sxs/prior Treatment) HPI patient reports he has had palpitations "all my life". He relates about 4 years ago he was working in [place] and had an episode and went to the ED where he was diagnosed with SVT with heart rate of 160. He was admitted to the hospital and given oral metoprolol and he converted to normal sinus rhythm. He has some of his records from that visit and it looks like afterwards he did have a heart rate that got down to 46. He was discharged home from the hospital on metoprolol 25 mg twice a day however due to the bradycardia it was cut in half and then he was advised only to use it when he had palpitations. He states it started at  noon yesterday, November 16 he started having palpitations again. At 1 pm  he took 25 mg of atenolol and repeated it about an hour later. He reports it did not help with palpitations. He states initially his chest felt tight however that has subsided. He states he's had no energy today and has had some mild shortness of breath at times including in the car coming to the ED. He states he's dizzy when he stands and feels lightheaded. He's had some diaphoretic episodes throughout the day. He finally decided it was time to come to the ED. Patient states this is one of the most prolonged episodes he has had since the other episode 4 years ago. He states he had a minor episode about 2 weeks ago and prior to that was 6 months ago. He states they normally last less than 15 minutes.   PCP Dr Sherren Mocha Cardiology Dr Darral Dash in [place], [place]  Past Medical History  Diagnosis Date  . Hypertension   . Eczema   . Psoriasis   . Hearing loss     high frequency  . Cataract of both eyes   . Erectile dysfunction   .  Obesity   . S/P appendectomy   . History of inguinal hernia repair, bilateral   . S/P tonsillectomy   . Loss of hearing     high frequency  . Paroxysmal SVT (supraventricular tachycardia) ([place])   . Type II or unspecified type diabetes mellitus without mention of complication, uncontrolled     12-29-14 Told he was prediabetic once   Past Surgical History  Procedure Laterality Date  . Appendectomy    . Tonsillectomy    . Eye surgery      Catracts removed both eye 30 yrs ago  . Hernia repair      right and left  . Cystoscopy with retrograde pyelogram, ureteroscopy and stent placement Left 12/30/2014    Procedure: CYSTOSCOPY WITH LEFT URETEROSCOPY STONE EXTRACTION WITH STENT;  Surgeon: Franchot Gallo, MD;  Location: [facility];  Service: Urology;  Laterality: Left;  . Holmium laser application Left [id number]    Procedure: HOLMIUM LASER APPLICATION;  Surgeon: Franchot Gallo, MD;  Location: [facility];  Service: Urology;  Laterality: Left;   Family History  Problem Relation Age of Onset  . Cancer Mother     lung  . Diabetes Father   . Thyroid disease Mother   . Lung cancer Mother   . Colon cancer Neg Hx    Social History  Substance Use Topics  . Smoking status: Former Research scientist (life sciences)  . Smokeless tobacco: Never Used     Comment: Quit 20-30 years ago  . Alcohol Use: 1.8 oz/week    1 Glasses of wine, 1 Cans of beer, 1 Shots of liquor per week     Comment: occasional  lives at home Lives with spouse retired  Review of Systems  All other systems reviewed and are negative.     Allergies  Peanut-containing drug products and Penicillins  Home Medications   Prior to Admission medications   Medication Sig Start Date End Date Taking? Authorizing Provider  aspirin 81 MG chewable tablet Chew 81 mg by mouth daily.   Yes Historical Provider, MD  atenolol (TENORMIN) 25 MG tablet One half tab every 6 hours when necessary for SVT 12/18/13  Yes Dorena Cookey, MD  clobetasol ointment (TEMOVATE)  0.05 % Apply topically daily. Apply each bedtime 12/18/13  Yes Dorena Cookey, MD  Cyanocobalamin (VITAMIN B-12 PO) Take 1 tablet by mouth daily.   Yes Historical Provider, MD  desonide (DESOWEN) 0.05 % cream Apply topically 2 (two) times daily. Twice a month for skin rash Patient taking differently: Apply 1 application topically daily as needed (for skin rash/irritatin).  12/18/13  Yes Dorena Cookey, MD  fluticasone [facility]) 50 MCG/ACT nasal spray Place 2 sprays into both nostrils daily. 09/25/14  Yes [name], MD  HYDROcodone-homatropine [facility]) 5-1.5 MG/5ML syrup Take 5 mLs by mouth every 8 (eight) hours as needed for cough. 02/24/15  Yes Dorothyann Peng, NP  loratadine (CLARITIN) 10 MG tablet Take 10 mg by mouth daily.   Yes Historical Provider, MD  triamcinolone ointment (KENALOG) 0.1 % Apply topically 2 (two) times daily. Apply thin layer 12/18/13  Yes Dorena Cookey, MD  vitamin C (ASCORBIC ACID) 500 MG tablet Take 500 mg by mouth daily.   Yes Historical Provider, MD   BP 106/82 mmHg  Pulse 143  Temp(Src) 97.9 F (36.6 C) (Oral)  Resp 22  Ht 5\' 8"  (1.727 m)  Wt 223 lb (101.152 kg)  BMI 33.91 kg/m2  SpO2 95%  Vital signs normal except for tachycardia  Physical Exam  Constitutional: He is oriented to person, place, and time. He appears well-developed and well-nourished.  Non-toxic appearance. He does not appear ill. No distress.  HENT:  Head: Normocephalic and atraumatic.  Right Ear: External ear normal.  Left Ear: External ear normal.  Nose: Nose normal. No mucosal edema or rhinorrhea.  Mouth/Throat: Oropharynx is clear and moist and mucous membranes are normal. No dental abscesses or uvula swelling.  Eyes: Conjunctivae and EOM are normal. Pupils are equal, round, and reactive to light.  Neck: Normal range of motion and full passive range of motion without pain. Neck supple.  Cardiovascular: Regular rhythm and normal heart sounds.  Tachycardia present.  Exam reveals no  gallop and no friction rub.   No murmur heard. Pulmonary/Chest: Effort normal and breath sounds normal. No respiratory distress. He has no wheezes. He has no rhonchi. He has no rales. He exhibits no tenderness and no crepitus.  Abdominal: Soft. Normal appearance and bowel sounds are normal. He exhibits no distension. There is no tenderness. There is no rebound and no guarding.  Musculoskeletal: Normal range of motion. He exhibits no edema or tenderness.  Moves all extremities well.   Neurological: He is alert and oriented to person, place, and time. He has normal strength. No cranial nerve deficit.  Skin: Skin is warm, dry  and intact. No rash noted. No erythema. No pallor.  Psychiatric: He has a normal mood and affect. His speech is normal and behavior is normal. His mood appears not anxious.  Nursing note and vitals reviewed.   ED Course  Procedures (including critical care time)  Medications  aspirin chewable tablet 81 mg (not administered)  loratadine (CLARITIN) tablet 10 mg (not administered)  fluticasone (FLONASE) 50 MCG/ACT nasal spray 2 spray (not administered)  enoxaparin (LOVENOX) injection 40 mg (not administered)  0.9 %  sodium chloride infusion (not administered)  ondansetron (ZOFRAN) tablet 4 mg (not administered)    Or  ondansetron (ZOFRAN) injection 4 mg (not administered)  morphine 2 MG/ML injection 1 mg (not administered)  metoprolol tartrate (LOPRESSOR) tablet 12.5 mg (not administered)  metoprolol (LOPRESSOR) injection 5 mg (5 mg Intravenous Given 06/10/15 0154)  sodium chloride 0.9 % bolus 500 mL (0 mLs Intravenous Stopped 06/10/15 0254)  sodium chloride 0.9 % bolus 1,000 mL (1,000 mLs Intravenous Transfusing/Transfer 06/10/15 0415)    Pt was given IV lopressor for his SVT.  02:00 nurse reports patient HR is now 51, BP 110/73. States he's having a central chest discomfort. EKG was done during this time and did not show acute changes.   02:45 Pt and spouse given  his test results. Patient states his chest discomfort is gone. We discussed admission B/O + troponin (most likely because of the prolonged tachycardia) and he is agreeable.  03:40 cardiology has not responded to consult placed at 02:53  03:50 Dr Darrick Meigs, Hospitalist will admit to observation, tele  Labs Review Results for orders placed or performed during the hospital encounter of 06/10/15  Comprehensive metabolic panel  Result Value Ref Range   Sodium 140 135 - 145 mmol/L   Potassium 4.6 3.5 - 5.1 mmol/L   Chloride 106 101 - 111 mmol/L   CO2 25 22 - 32 mmol/L   Glucose, Bld 151 (H) 65 - 99 mg/dL   BUN 32 (H) 6 - 20 mg/dL   Creatinine, Ser 1.19 0.61 - 1.24 mg/dL   Calcium 9.0 8.9 - 10.3 mg/dL   Total Protein 7.0 6.5 - 8.1 g/dL   Albumin 4.2 3.5 - 5.0 g/dL   AST 57 (H) 15 - 41 U/L   ALT 24 17 - 63 U/L   Alkaline Phosphatase 71 38 - 126 U/L   Total Bilirubin 0.6 0.3 - 1.2 mg/dL   GFR calc non Af Amer >60 >60 mL/min   GFR calc Af Amer >60 >60 mL/min   Anion gap 9 5 - 15  CBC with Differential  Result Value Ref Range   WBC 13.1 (H) 4.0 - 10.5 K/uL   RBC 4.84 4.22 - 5.81 MIL/uL   Hemoglobin 16.2 13.0 - 17.0 g/dL   HCT 45.8 39.0 - 52.0 %   MCV 94.6 78.0 - 100.0 fL   MCH 33.5 26.0 - 34.0 pg   MCHC 35.4 30.0 - 36.0 g/dL   RDW 12.8 11.5 - 15.5 %   Platelets 235 150 - 400 K/uL   Neutrophils Relative % 70 %   Neutro Abs 9.1 (H) 1.7 - 7.7 K/uL   Lymphocytes Relative 19 %   Lymphs Abs 2.5 0.7 - 4.0 K/uL   Monocytes Relative 9 %   Monocytes Absolute 1.1 (H) 0.1 - 1.0 K/uL   Eosinophils Relative 2 %   Eosinophils Absolute 0.3 0.0 - 0.7 K/uL   Basophils Relative 0 %   Basophils Absolute 0.0 0.0 - 0.1 K/uL  Troponin I  Result Value Ref Range   Troponin I 0.66 (HH) <0.031 ng/mL  Magnesium  Result Value Ref Range   Magnesium 2.2 1.7 - 2.4 mg/dL  Troponin I  Result Value Ref Range   Troponin I 0.85 (HH) <0.031 ng/mL   Laboratory interpretation all normal except positive troponin,  leukocytosis, elevated BUN c/w dehydration (states didn't feel like eating or drinking today).      Imaging Review No results found. I have personally reviewed and evaluated these images and lab results as part of my medical decision-making.   EKG Interpretation  #1 Date/Time:  Thursday June 10 2015 01:37:53 EST Ventricular Rate:  142 PR Interval:    QRS Duration: 83 QT Interval:  297 QTC Calculation: 456 R Axis:   -30 Text Interpretation:  Junctional tachycardia Left axis deviation Minimal  ST depression, inferior leads Since last tracing rate faster (02 Aug 2010)  Confirmed by [facility]  MD-I, Delvon Chipps ([id number]) on 06/10/2015 1:47:49 AM   #2  EKG Interpretation  Date/Time:  Thursday June 10 2015 02:01:13 EST Ventricular Rate:  51 PR Interval:  233 QRS Duration: 86 QT Interval:  409 QTC Calculation: 377 R Axis:   8 Text Interpretation:  Sinus bradycardia Atrial premature complex Prolonged PR interval Nonspecific T abnormalities, lateral leads Since last tracing of earlier today Sinus bradycardia has replaced Supraventricular tachycardia Confirmed by Koleson Reifsteck  MD-I, Devony Mcgrady ([id number]) on 06/10/2015 2:06:18 AM        MDM   Final diagnoses:  SVT (supraventricular tachycardia) (HCC)  Troponin level elevated  Sinus bradycardia    Plan admission   Rolland Porter, MD, Barbette Or, MD 06/10/15 [phone]

## 2015-06-10 NOTE — Progress Notes (Signed)
Patient briefly seen and examined. Discussed case with cardiology. Presented with SVT and now has rising troponins. Plan to check ECHO to determine if needs urgent cath or if we can plan for an OP stress test. Will continue to follow.  Domingo Mend, MD [facility] Pager: [phone]

## 2015-06-10 NOTE — H&P (Signed)
PCP:   Joycelyn Man, MD   Chief Complaint:  Palpitations  HPI:  69 year old male who  has a past medical history of Hypertension; Eczema; Psoriasis; Hearing loss; Cataract of both eyes; Erectile dysfunction; Obesity; S/P appendectomy; History of inguinal hernia repair, bilateral; S/P tonsillectomy; Loss of hearing; Paroxysmal SVT (supraventricular tachycardia) (HCC); and Type II or unspecified type diabetes mellitus without mention of complication, uncontrolled. Today came to the ED with complaints of palpitations. Patient says that he has a history of SVT since the age of 61, he was started on atenolol 25 mg daily which he could not tolerate due to bradycardia and was taken off atenolol. Patient was told by his cardiologist to take atenolol when needed for SVT. Yesterday afternoon patient started having palpitations and he took atenolol yesterday which did not help. Patient waited about 12 hours to come to the ED. In the meantime he also experienced some chest heaviness and discomfort, no shortness of breath. No nausea vomiting or diarrhea. Did not pass out. No blurred vision. In the ED patient was found to be in SVT and was given one dose of IV metoprolol 5 mg 1 and rhythm is back to normal sinus rhythm. At this time EKG showing bradycardia. Cardiac enzymes in the ED shows troponin 0.66. Currently patient is chest pain-free.  Allergies:   Allergies  Allergen Reactions  . Peanut-Containing Drug Products Hives  . Penicillins Hives      Past Medical History  Diagnosis Date  . Hypertension   . Eczema   . Psoriasis   . Hearing loss     high frequency  . Cataract of both eyes   . Erectile dysfunction   . Obesity   . S/P appendectomy   . History of inguinal hernia repair, bilateral   . S/P tonsillectomy   . Loss of hearing     high frequency  . Paroxysmal SVT (supraventricular tachycardia) ([place])   . Type II or unspecified type diabetes mellitus without mention of  complication, uncontrolled     12-29-14 Told he was prediabetic once    Past Surgical History  Procedure Laterality Date  . Appendectomy    . Tonsillectomy    . Eye surgery      Catracts removed both eye 30 yrs ago  . Hernia repair      right and left  . Cystoscopy with retrograde pyelogram, ureteroscopy and stent placement Left 12/30/2014    Procedure: CYSTOSCOPY WITH LEFT URETEROSCOPY STONE EXTRACTION WITH STENT;  Surgeon: Franchot Gallo, MD;  Location: [facility];  Service: Urology;  Laterality: Left;  . Holmium laser application Left [id number]    Procedure: HOLMIUM LASER APPLICATION;  Surgeon: Franchot Gallo, MD;  Location: [facility];  Service: Urology;  Laterality: Left;    Prior to Admission medications   Medication Sig Start Date End Date Taking? Authorizing Provider  aspirin 81 MG chewable tablet Chew 81 mg by mouth daily.   Yes Historical Provider, MD  atenolol (TENORMIN) 25 MG tablet One half tab every 6 hours when necessary for SVT 12/18/13  Yes Dorena Cookey, MD  clobetasol ointment (TEMOVATE) 0.05 % Apply topically daily. Apply each bedtime 12/18/13  Yes Dorena Cookey, MD  Cyanocobalamin (VITAMIN B-12 PO) Take 1 tablet by mouth daily.   Yes Historical Provider, MD  desonide (DESOWEN) 0.05 % cream Apply topically 2 (two) times daily. Twice a month for skin rash Patient taking differently: Apply 1 application topically daily as needed (for skin rash/irritatin).  12/18/13  Yes Dorena Cookey, MD  fluticasone [facility]) 50 MCG/ACT nasal spray Place 2 sprays into both nostrils daily. 09/25/14  Yes [name], MD  HYDROcodone-homatropine [facility]) 5-1.5 MG/5ML syrup Take 5 mLs by mouth every 8 (eight) hours as needed for cough. 02/24/15  Yes Dorothyann Peng, NP  loratadine (CLARITIN) 10 MG tablet Take 10 mg by mouth daily.   Yes Historical Provider, MD  triamcinolone ointment (KENALOG) 0.1 % Apply topically 2 (two) times daily. Apply thin layer 12/18/13  Yes Dorena Cookey, MD  vitamin  C (ASCORBIC ACID) 500 MG tablet Take 500 mg by mouth daily.   Yes Historical Provider, MD    Social History:  reports that he has quit smoking. He has never used smokeless tobacco. He reports that he drinks about 1.8 oz of alcohol per week. He reports that he does not use illicit drugs. patient says that he drinks 2-3 cups of coffee everyday and yesterday he drank an extra cup.  Family History  Problem Relation Age of Onset  . Cancer Mother     lung  . Diabetes Father   . Thyroid disease Mother   . Lung cancer Mother   . Colon cancer Neg Hx     Filed Weights   06/10/15 0135  Weight: 101.152 kg (223 lb)    All the positives are listed in BOLD  Review of Systems:  HEENT: Headache, blurred vision, runny nose, sore throat Neck: Hypothyroidism, hyperthyroidism,,lymphadenopathy Chest : Shortness of breath, history of COPD, Asthma Heart : Chest pain, history of coronary arterey disease GI:  Nausea, vomiting, diarrhea, constipation, GERD GU: Dysuria, urgency, frequency of urination, hematuria Neuro: Stroke, seizures, syncope Psych: Depression, anxiety, hallucinations   Physical Exam: Blood pressure 109/77, pulse 50, temperature 97.9 F (36.6 C), temperature source Oral, resp. rate 13, height 5\' 8"  (1.727 m), weight 101.152 kg (223 lb), SpO2 99 %. Constitutional:   Patient is a well-developed and well-nourished male* in no acute distress and cooperative with exam. Head: Normocephalic and atraumatic Mouth: Mucus membranes moist Eyes: PERRL, EOMI, conjunctivae normal Neck: Supple, No Thyromegaly Cardiovascular: RRR, S1 normal, S2 normal Pulmonary/Chest: CTAB, no wheezes, rales, or rhonchi Abdominal: Soft. Non-tender, non-distended, bowel sounds are normal, no masses, organomegaly, or guarding present.  Neurological: A&O x3, Strength is normal and symmetric bilaterally, cranial nerve II-XII are grossly intact, no focal motor deficit, sensory intact to light touch bilaterally.    Extremities : No Cyanosis, Clubbing or Edema  Labs on Admission:  Basic Metabolic Panel:  Recent Labs Lab 06/10/15 0143  NA 140  K 4.6  CL 106  CO2 25  GLUCOSE 151*  BUN 32*  CREATININE 1.19  CALCIUM 9.0  MG 2.2   Liver Function Tests:  Recent Labs Lab 06/10/15 0143  AST 57*  ALT 24  ALKPHOS 71  BILITOT 0.6  PROT 7.0  ALBUMIN 4.2   No results for input(s): LIPASE, AMYLASE in the last 168 hours. No results for input(s): AMMONIA in the last 168 hours. CBC:  Recent Labs Lab 06/10/15 0143  WBC 13.1*  NEUTROABS 9.1*  HGB 16.2  HCT 45.8  MCV 94.6  PLT 235   Cardiac Enzymes:  Recent Labs Lab 06/10/15 0143  TROPONINI 0.66*      EKG: Independently reviewed. Sinus bradycardia   Assessment/Plan Active Problems:   Allergic rhinitis   Sinus bradycardia   SVT (supraventricular tachycardia) (HCC)  SVT Resolved after patient received metoprolol in the ED Will admit the patient for observation in telemetry  We'll start metoprolol 12.5 mg by mouth twice a day Consult cardiology in a.m to discuss about possible radiofrequency ablation  Elevated troponin Patient has mild elevation of troponin 0.66 From demand ischemia due to prolonged SVT Will obtain serial cardiac enzymes Continue aspirin  Allergic rhinitis Continue Claritin, Flonase  DVT prophylaxis Lovenox  Code status: Full code  Family discussion: Admission, patients condition and plan of care including tests being ordered have been discussed with the patient and *his wife at bedside who indicate understanding and agree with the plan and Code Status.   Time Spent on Admission: 60 min  [facility] Pager: [phone] 06/10/2015, 4:33 AM  If 7PM-7AM, please contact night-coverage  [email or web address]  Password [email or web address]

## 2015-06-10 NOTE — Progress Notes (Signed)
ANTICOAGULATION CONSULT NOTE - Initial Consult  Pharmacy Consult for Heparin Indication: chest pain/ACS  Allergies  Allergen Reactions  . Peanut-Containing Drug Products Hives  . Penicillins Hives   Patient Measurements: Height: 5\' 8"  (172.7 cm) Weight: 223 lb (101.152 kg) IBW/kg (Calculated) : 68.4  HEPARIN DW (KG): 90.2  Vital Signs: Temp: 98.1 F (36.7 C) (11/17 0841) Temp Source: Oral (11/17 0841) BP: 112/72 mmHg (11/17 1105) Pulse Rate: 53 (11/17 1105)  Labs:  Recent Labs  06/10/15 0143 06/10/15 0341 06/10/15 0702 06/10/15 0703  HGB 16.2  --  13.8  --   HCT 45.8  --  39.9  --   PLT 235  --  199  --   CREATININE 1.19  --  1.15  --   TROPONINI 0.66* 0.85*  --  1.17*   Estimated Creatinine Clearance: 69.9 mL/min (by C-G formula based on Cr of 1.15).  Medical History: Past Medical History  Diagnosis Date  . Hypertension   . Eczema   . Psoriasis   . Hearing loss     high frequency  . Cataract of both eyes   . Erectile dysfunction   . Obesity   . S/P appendectomy   . History of inguinal hernia repair, bilateral   . S/P tonsillectomy   . Loss of hearing     high frequency  . Paroxysmal SVT (supraventricular tachycardia) ([place])   . Type II or unspecified type diabetes mellitus without mention of complication, uncontrolled     12-29-14 Told he was prediabetic once   Assessment: 69yo male his HR was elevated at 142 bpm. BP 106/82. Troponin 0.85 and 0.66 respectively. Slightly elevated LFTs. EKG demonstrated SVT, possible ANRT, with biphasic P-waves note in leads V1 and V2. Marland Kitchen He was given IV metoprolol 5 mg. HR returned to NSR with 1st degree AV Block.  Asked to initiate Heparin for ACS.  Pt received Lovenox 40mg  earlier today.    Goal of Therapy:  Heparin level 0.3-0.7 units/ml Monitor platelets by anticoagulation protocol: Yes   Plan:   Heparin 3000 units IV bolus now x 1 (dose reduction due to Lovenox)  Heparin infusion at 1250 units/hr  Heparin level  in 6-8 hrs then daily  CBC daily while on Heparin  [place] Crane, Noella Kipnis A  06/10/2015,11:16 AM

## 2015-06-11 DIAGNOSIS — I351 Nonrheumatic aortic (valve) insufficiency: Secondary | ICD-10-CM

## 2015-06-11 DIAGNOSIS — I248 Other forms of acute ischemic heart disease: Secondary | ICD-10-CM

## 2015-06-11 DIAGNOSIS — I77810 Thoracic aortic ectasia: Secondary | ICD-10-CM

## 2015-06-11 LAB — LIPID PANEL
CHOL/HDL RATIO: 4.4 ratio
Cholesterol: 137 mg/dL (ref 0–200)
HDL: 31 mg/dL — ABNORMAL LOW (ref >40)
LDL Cholesterol: 84 mg/dL (ref 0–99)
Triglycerides: 109 mg/dL (ref <150)
VLDL: 22 mg/dL (ref 0–40)

## 2015-06-11 LAB — CBC
HEMATOCRIT: 39.3 % (ref 39.0–52.0)
HEMOGLOBIN: 13.6 g/dL (ref 13.0–17.0)
MCH: 32.9 pg (ref 26.0–34.0)
MCHC: 34.6 g/dL (ref 30.0–36.0)
MCV: 95.2 fL (ref 78.0–100.0)
Platelets: 158 10*3/uL (ref 150–400)
RBC: 4.13 MIL/uL — ABNORMAL LOW (ref 4.22–5.81)
RDW: 12.3 % (ref 11.5–15.5)
WBC: 8 10*3/uL (ref 4.0–10.5)

## 2015-06-11 LAB — HEPARIN LEVEL (UNFRACTIONATED): HEPARIN UNFRACTIONATED: 0.46 [IU]/mL (ref 0.30–0.70)

## 2015-06-11 MED ORDER — ATORVASTATIN CALCIUM 20 MG PO TABS: 20.0000 mg | ORAL_TABLET | Freq: Every day | ORAL | Status: DC

## 2015-06-11 MED ORDER — METOPROLOL TARTRATE 25 MG PO TABS: 12.5000 mg | ORAL_TABLET | Freq: Two times a day (BID) | ORAL | Status: DC

## 2015-06-11 NOTE — Care Management Note (Signed)
Case Management Note  Patient Details  Name: CACEY BARTZ MRN: [id number] Date of Birth: [birth year]  Subjective/Objective:                    Action/Plan:   Expected Discharge Date:                  Expected Discharge Plan:  Home/Self Care  In-House Referral:  NA  Discharge planning Services  CM Consult  Post Acute Care Choice:  NA Choice offered to:  NA  DME Arranged:    DME Agency:     HH Arranged:    [place] Agency:     Status of Service:  Completed, signed off  Medicare Important Message Given:  Yes Date Medicare IM Given:    Medicare IM give by:    Date Additional Medicare IM Given:    Additional Medicare Important Message give by:     If discussed at [place] of Stay Meetings, dates discussed:    Additional Comments: Pt discharged home today. No CM needs noted. Christinia Gully [place], RN 06/11/2015, 2:17 PM

## 2015-06-11 NOTE — Progress Notes (Signed)
ANTICOAGULATION CONSULT NOTE - follow up  Pharmacy Consult for Heparin Indication: chest pain/ACS  Allergies  Allergen Reactions  . Peanut-Containing Drug Products Hives  . Penicillins Hives   Patient Measurements: Height: 5\' 8"  (172.7 cm) Weight: 223 lb (101.152 kg) IBW/kg (Calculated) : 68.4  HEPARIN DW (KG): 90.2  Vital Signs: Temp: 98.2 F (36.8 C) (11/18 0420) Temp Source: Oral (11/18 0420) BP: 125/70 mmHg (11/18 0420) Pulse Rate: 56 (11/18 0420)  Labs:  Recent Labs  06/10/15 0143  06/10/15 0702 06/10/15 0703 06/10/15 1046 06/10/15 1642 06/11/15 0604  HGB 16.2  --  13.8  --   --   --  13.6  HCT 45.8  --  39.9  --   --   --  39.3  PLT 235  --  199  --   --   --  158  HEPARINUNFRC  --   --   --   --   --  0.55 0.46  CREATININE 1.19  --  1.15  --   --   --   --   TROPONINI 0.66*  < >  --  1.17* 1.13* 0.98*  --   < > = values in this interval not displayed. Estimated Creatinine Clearance: 69.9 mL/min (by C-G formula based on Cr of 1.15).  Medical History: Past Medical History  Diagnosis Date  . Hypertension   . Eczema   . Psoriasis   . Hearing loss     high frequency  . Cataract of both eyes   . Erectile dysfunction   . Obesity   . S/P appendectomy   . History of inguinal hernia repair, bilateral   . S/P tonsillectomy   . Loss of hearing     high frequency  . Paroxysmal SVT (supraventricular tachycardia) ([place])   . Type II or unspecified type diabetes mellitus without mention of complication, uncontrolled     12-29-14 Told he was prediabetic once   Assessment: 69yo male his HR was elevated at 142 bpm. BP 106/82. Troponin 0.85 and 0.66 respectively. Slightly elevated LFTs. EKG demonstrated SVT, possible ANRT, with biphasic P-waves note in leads V1 and V2. Marland Kitchen He was given IV metoprolol 5 mg. HR returned to NSR with 1st degree AV Block.  Asked to initiate Heparin for ACS.  Heparin level therapeutic x 2 consecutive checks.  CBC OK.    Goal of Therapy:   Heparin level 0.3-0.7 units/ml Monitor platelets by anticoagulation protocol: Yes   Plan:   Continue Heparin infusion at 1250 units/hr  Heparin level daily  CBC daily while on Heparin  Taren Dymek A  06/11/2015,9:44 AM

## 2015-06-11 NOTE — Clinical Documentation Improvement (Signed)
Family Medicine Internal Medicine  Consult Note states diagnosis "NSTEMI". If you agree with this diagnosis please add to next Note.  Please exercise your independent, professional judgment when responding. A specific answer is not anticipated or expected.  Thank You, Ezekiel Ina RN [place] [phone]

## 2015-06-11 NOTE — Progress Notes (Signed)
Patient discharged home.  IVs removed - WNL.  Reviewed medications and DC instructions.  Follow up in place with cardio.  Verbalizes understanding, no questions at this time.  Stable to DC home, ambulated off floor with staff assistance.

## 2015-06-11 NOTE — Care Management Note (Signed)
Case Management Note  Patient Details  Name: OTHELL MCQUEARY MRN: [id number] Date of Birth: [birth year]  Subjective/Objective:                    Action/Plan:   Expected Discharge Date:                  Expected Discharge Plan:  Home/Self Care  In-House Referral:  NA  Discharge planning Services  CM Consult  Post Acute Care Choice:  NA Choice offered to:  NA  DME Arranged:    DME Agency:     HH Arranged:    [place] Agency:     Status of Service:  Completed, signed off  Medicare Important Message Given:  Yes Date Medicare IM Given:    Medicare IM give by:    Date Additional Medicare IM Given:    Additional Medicare Important Message give by:     If discussed at [place] of Stay Meetings, dates discussed:    Additional Comments: Pt still on heparin gtt. No CM needs anticipated. Christinia Gully [place], RN 06/11/2015, 8:39 AM

## 2015-06-11 NOTE — Care Management Important Message (Signed)
Important Message  Patient Details  Name: Daniel Guerrero MRN: [id number] Date of Birth: [birth year]   Medicare Important Message Given:  Yes    Joylene Draft, RN 06/11/2015, 8:39 AM

## 2015-06-11 NOTE — Discharge Summary (Signed)
Physician Discharge Summary  HARSHAAN Guerrero [id number] DOB: 15-Apr-1946 DOA: 06/10/2015  PCP: Joycelyn Man, MD  Admit date: 06/10/2015 Discharge date: 06/11/2015  Time spent: 45 minutes  Recommendations for Outpatient Follow-up:  -Will be discharged home today. -Has been scheduled to see cardiology on 12/9.   Discharge Diagnoses:  Active Problems:   Allergic rhinitis   Sinus bradycardia   SVT (supraventricular tachycardia) (HCC)   Troponin level elevated   Discharge Condition: Stable and improved  Filed Weights   06/10/15 0135  Weight: 101.152 kg (223 lb)    History of present illness:  As per Dr. Darrick Meigs 11/17: Today came to the ED with complaints of palpitations. Patient says that he has a history of SVT since the age of 65, he was started on atenolol 25 mg daily which he could not tolerate due to bradycardia and was taken off atenolol. Patient was told by his cardiologist to take atenolol when needed for SVT. Yesterday afternoon patient started having palpitations and he took atenolol yesterday which did not help. Patient waited about 12 hours to come to the ED. In the meantime he also experienced some chest heaviness and discomfort, no shortness of breath. No nausea vomiting or diarrhea. Did not pass out. No blurred vision. In the ED patient was found to be in SVT and was given one dose of IV metoprolol 5 mg 1 and rhythm is back to normal sinus rhythm. At this time EKG showing bradycardia. Cardiac enzymes in the ED shows troponin 0.66. Currently patient is chest pain-free.  Hospital Course:   SVT -no recurrences noted. -Continue metoprolol.  Demand Ischemia -Echo showed normal LVEF 60-65% and normal regional wall motion. On ASA and beta blocker.  -Has been started on a statin. -Will follow up with cardiology for OP stress test.  Aortic Root Dilatation and mild Aortic Valve Insufficiency -Per cards, plan to order OP CT angiography for more extensive  evaluation of aorta.   Procedures:  None   Consultations:  Cardiology  Discharge Instructions  Discharge Instructions    Diet - low sodium heart healthy    Complete by:  As directed      Increase activity slowly    Complete by:  As directed             Medication List    STOP taking these medications        atenolol 25 MG tablet  Commonly known as:  TENORMIN      TAKE these medications        aspirin 81 MG chewable tablet  Chew 81 mg by mouth daily.     atorvastatin 20 MG tablet  Commonly known as:  LIPITOR  Take 1 tablet (20 mg total) by mouth daily at 6 PM.     fluticasone 50 MCG/ACT nasal spray  Commonly known as:  FLONASE  Place 2 sprays into both nostrils daily.     loratadine 10 MG tablet  Commonly known as:  CLARITIN  Take 10 mg by mouth daily.     metoprolol tartrate 25 MG tablet  Commonly known as:  LOPRESSOR  Take 0.5 tablets (12.5 mg total) by mouth 2 (two) times daily.     VITAMIN B-12 PO  Take 1 tablet by mouth daily.     vitamin C 500 MG tablet  Commonly known as:  ASCORBIC ACID  Take 500 mg by mouth daily.       Allergies  Allergen Reactions  . Peanut-Containing Drug Products  Hives  . Penicillins Hives       Follow-up Information    Follow up with Jory Sims, NP On 07/02/2015.   Specialties:  Nurse Practitioner, Radiology, Cardiology   Why:  1:10   Contact information:   [place] [address] [phone]        The results of significant diagnostics from this hospitalization (including imaging, microbiology, ancillary and laboratory) are listed below for reference.    Significant Diagnostic Studies: No results found.  Microbiology: No results found for this or any previous visit (from the past 240 hour(s)).   Labs: Basic Metabolic Panel:  Recent Labs Lab 06/10/15 0143 06/10/15 0702  NA 140 141  K 4.6 5.0  CL 106 111  CO2 25 28  GLUCOSE 151* 123*  BUN 32* 27*  CREATININE 1.19 1.15    CALCIUM 9.0 8.2*  MG 2.2  --    Liver Function Tests:  Recent Labs Lab 06/10/15 0143 06/10/15 0702  AST 57* 47*  ALT 24 20  ALKPHOS 71 61  BILITOT 0.6 0.9  PROT 7.0 5.7*  ALBUMIN 4.2 3.4*   No results for input(s): LIPASE, AMYLASE in the last 168 hours. No results for input(s): AMMONIA in the last 168 hours. CBC:  Recent Labs Lab 06/10/15 0143 06/10/15 0702 06/11/15 0604  WBC 13.1* 9.9 8.0  NEUTROABS 9.1*  --   --   HGB 16.2 13.8 13.6  HCT 45.8 39.9 39.3  MCV 94.6 95.2 95.2  PLT 235 199 158   Cardiac Enzymes:  Recent Labs Lab 06/10/15 0143 06/10/15 0341 06/10/15 0703 06/10/15 1046 06/10/15 1642  TROPONINI 0.66* 0.85* 1.17* 1.13* 0.98*   BNP: BNP (last 3 results) No results for input(s): BNP in the last 8760 hours.  ProBNP (last 3 results) No results for input(s): PROBNP in the last 8760 hours.  CBG: No results for input(s): GLUCAP in the last 168 hours.     SignedLelon Frohlich  [facility] Pager: [phone] 06/11/2015, 1:58 PM

## 2015-06-11 NOTE — Progress Notes (Signed)
SUBJECTIVE: No complaints of chest pain/shortness of breath.     Intake/Output Summary (Last 24 hours) at 06/11/15 1131 Last data filed at 06/11/15 0648  Gross per 24 hour  Intake 1526.38 ml  Output      5 ml  Net 1521.38 ml    Current Facility-Administered Medications  Medication Dose Route Frequency Provider Last Rate Last Dose  . 0.9 %  sodium chloride infusion   Intravenous Continuous Oswald Hillock, MD 10 mL/hr at 06/10/15 0500    . aspirin chewable tablet 81 mg  81 mg Oral Daily Oswald Hillock, MD   81 mg at 06/11/15 1047  . atorvastatin (LIPITOR) tablet 20 mg  20 mg Oral q1800 Herminio Commons, MD      . fluticasone (FLONASE) 50 MCG/ACT nasal spray 2 spray  2 spray Each Nare Daily Oswald Hillock, MD   2 spray at 06/10/15 1231  . heparin ADULT infusion 100 units/mL (25000 units/250 mL)  1,250 Units/hr Intravenous Continuous Erline Hau, MD 12.5 mL/hr at 06/11/15 0425 1,250 Units/hr at 06/11/15 0425  . loratadine (CLARITIN) tablet 10 mg  10 mg Oral Daily Oswald Hillock, MD   10 mg at 06/11/15 1048  . metoprolol tartrate (LOPRESSOR) tablet 12.5 mg  12.5 mg Oral BID Oswald Hillock, MD   12.5 mg at 06/10/15 1107  . morphine 2 MG/ML injection 1 mg  1 mg Intravenous Q4H PRN Oswald Hillock, MD      . ondansetron [facility]) tablet 4 mg  4 mg Oral Q6H PRN Oswald Hillock, MD       Or  . ondansetron (ZOFRAN) injection 4 mg  4 mg Intravenous Q6H PRN Oswald Hillock, MD        Filed Vitals:   06/10/15 1105 06/10/15 1433 06/10/15 2245 06/11/15 0420  BP: 112/72 129/76 154/76 125/70  Pulse: 53 50 56 56  Temp:  98.2 F (36.8 C) 98.6 F (37 C) 98.2 F (36.8 C)  TempSrc:  Oral Oral Oral  Resp:  18 16 16   Height:      Weight:      SpO2:  97% 97% 94%    PHYSICAL EXAM General: NAD HEENT: Normal. Neck: No JVD, no thyromegaly.  Lungs: Clear to auscultation bilaterally with normal respiratory effort. CV: Nondisplaced PMI.  Regular rate and rhythm, normal S1/S2, no S3/S4, no  murmur.  No pretibial edema.    Abdomen: Soft, nontender, obese. Neurologic: Alert and oriented x 3.  Psych: Normal affect. Musculoskeletal: Normal range of motion. No gross deformities. Extremities: No clubbing or cyanosis.   TELEMETRY: Reviewed telemetry pt in sinus rhythm/sinus bradycardia.  LABS: Basic Metabolic Panel:  Recent Labs  06/10/15 0143 06/10/15 0702  NA 140 141  K 4.6 5.0  CL 106 111  CO2 25 28  GLUCOSE 151* 123*  BUN 32* 27*  CREATININE 1.19 1.15  CALCIUM 9.0 8.2*  MG 2.2  --    Liver Function Tests:  Recent Labs  06/10/15 0143 06/10/15 0702  AST 57* 47*  ALT 24 20  ALKPHOS 71 61  BILITOT 0.6 0.9  PROT 7.0 5.7*  ALBUMIN 4.2 3.4*   No results for input(s): LIPASE, AMYLASE in the last 72 hours. CBC:  Recent Labs  06/10/15 0143 06/10/15 0702 06/11/15 0604  WBC 13.1* 9.9 8.0  NEUTROABS 9.1*  --   --   HGB 16.2 13.8 13.6  HCT 45.8 39.9 39.3  MCV 94.6  95.2 95.2  PLT 235 199 158   Cardiac Enzymes:  Recent Labs  06/10/15 0703 06/10/15 1046 06/10/15 1642  TROPONINI 1.17* 1.13* 0.98*   BNP: Invalid input(s): POCBNP D-Dimer: No results for input(s): DDIMER in the last 72 hours. Hemoglobin A1C: No results for input(s): HGBA1C in the last 72 hours. Fasting Lipid Panel:  Recent Labs  06/11/15 0604  CHOL 137  HDL 31*  LDLCALC 84  TRIG 109  CHOLHDL 4.4   Thyroid Function Tests:  Recent Labs  06/10/15 1642  TSH 1.572   Anemia Panel: No results for input(s): VITAMINB12, FOLATE, FERRITIN, TIBC, IRON, RETICCTPCT in the last 72 hours.  RADIOLOGY: No results found.    ASSESSMENT AND PLAN: 1. SVT: No recurrences on metoprolol. 12.5 mg bid.  2. Demand ischemia: Echo showed normal LVEF 60-65% and normal regional wall motion. On ASA and beta blocker. Will start low dose Lipitor 20 mg daily primarily for pleiotropic effects until outpatient stress test completed.  3. Aortic root dilatation with mild aortic regurgitation: Will  obtain outpatient CT angiography vs MRA for more extensive evaluation of aorta.   Kate Sable, M.D., F.A.C.C.

## 2015-06-24 ENCOUNTER — Emergency Department (HOSPITAL_COMMUNITY)
Admission: EM | Admit: 2015-06-24 | Discharge: 2015-06-24 | Disposition: A | Discharge status: Home / Self Care | Payer: Medicare Other | Attending: Emergency Medicine | Admitting: Emergency Medicine

## 2015-06-24 ENCOUNTER — Encounter (HOSPITAL_COMMUNITY): Payer: Self-pay | Admitting: *Deleted

## 2015-06-24 ENCOUNTER — Emergency Department (HOSPITAL_COMMUNITY): Payer: Medicare Other

## 2015-06-24 DIAGNOSIS — Z880 Allergy status to penicillin: Secondary | ICD-10-CM | POA: Diagnosis not present

## 2015-06-24 DIAGNOSIS — I10 Essential (primary) hypertension: Secondary | ICD-10-CM | POA: Diagnosis not present

## 2015-06-24 DIAGNOSIS — E119 Type 2 diabetes mellitus without complications: Secondary | ICD-10-CM | POA: Insufficient documentation

## 2015-06-24 DIAGNOSIS — I471 Supraventricular tachycardia: Secondary | ICD-10-CM | POA: Diagnosis not present

## 2015-06-24 DIAGNOSIS — Z7982 Long term (current) use of aspirin: Secondary | ICD-10-CM | POA: Diagnosis not present

## 2015-06-24 DIAGNOSIS — R110 Nausea: Secondary | ICD-10-CM | POA: Insufficient documentation

## 2015-06-24 DIAGNOSIS — Z87891 Personal history of nicotine dependence: Secondary | ICD-10-CM | POA: Insufficient documentation

## 2015-06-24 DIAGNOSIS — Z7951 Long term (current) use of inhaled steroids: Secondary | ICD-10-CM | POA: Diagnosis not present

## 2015-06-24 DIAGNOSIS — R0789 Other chest pain: Secondary | ICD-10-CM | POA: Diagnosis not present

## 2015-06-24 DIAGNOSIS — Z8719 Personal history of other diseases of the digestive system: Secondary | ICD-10-CM | POA: Insufficient documentation

## 2015-06-24 DIAGNOSIS — H9190 Unspecified hearing loss, unspecified ear: Secondary | ICD-10-CM | POA: Insufficient documentation

## 2015-06-24 DIAGNOSIS — R079 Chest pain, unspecified: Secondary | ICD-10-CM | POA: Diagnosis present

## 2015-06-24 DIAGNOSIS — Z79899 Other long term (current) drug therapy: Secondary | ICD-10-CM | POA: Diagnosis not present

## 2015-06-24 DIAGNOSIS — Z87438 Personal history of other diseases of male genital organs: Secondary | ICD-10-CM | POA: Diagnosis not present

## 2015-06-24 DIAGNOSIS — E669 Obesity, unspecified: Secondary | ICD-10-CM | POA: Diagnosis not present

## 2015-06-24 DIAGNOSIS — Z872 Personal history of diseases of the skin and subcutaneous tissue: Secondary | ICD-10-CM | POA: Diagnosis not present

## 2015-06-24 LAB — COMPREHENSIVE METABOLIC PANEL
ALT: 25 U/L (ref 17–63)
ANION GAP: 13 (ref 5–15)
AST: 74 U/L — ABNORMAL HIGH (ref 15–41)
Albumin: 4.4 g/dL (ref 3.5–5.0)
Alkaline Phosphatase: 72 U/L (ref 38–126)
BILIRUBIN TOTAL: 1.5 mg/dL — AB (ref 0.3–1.2)
BUN: 20 mg/dL (ref 6–20)
CHLORIDE: 106 mmol/L (ref 101–111)
CO2: 20 mmol/L — ABNORMAL LOW (ref 22–32)
Calcium: 9.4 mg/dL (ref 8.9–10.3)
Creatinine, Ser: 1.43 mg/dL — ABNORMAL HIGH (ref 0.61–1.24)
GFR calc Af Amer: 56 mL/min — ABNORMAL LOW (ref >60)
GFR, EST NON AFRICAN AMERICAN: 48 mL/min — AB (ref >60)
Glucose, Bld: 150 mg/dL — ABNORMAL HIGH (ref 65–99)
POTASSIUM: 5.8 mmol/L — AB (ref 3.5–5.1)
Sodium: 139 mmol/L (ref 135–145)
Total Protein: 6.6 g/dL (ref 6.5–8.1)

## 2015-06-24 LAB — CBC WITH DIFFERENTIAL/PLATELET
BASOS PCT: 0 %
Basophils Absolute: 0 10*3/uL (ref 0.0–0.1)
Eosinophils Absolute: 0.2 10*3/uL (ref 0.0–0.7)
Eosinophils Relative: 3 %
HEMATOCRIT: 41.4 % (ref 39.0–52.0)
HEMOGLOBIN: 14.4 g/dL (ref 13.0–17.0)
LYMPHS ABS: 1.5 10*3/uL (ref 0.7–4.0)
LYMPHS PCT: 16 %
MCH: 32.4 pg (ref 26.0–34.0)
MCHC: 34.8 g/dL (ref 30.0–36.0)
MCV: 93.2 fL (ref 78.0–100.0)
MONOS PCT: 5 %
Monocytes Absolute: 0.4 10*3/uL (ref 0.1–1.0)
NEUTROS ABS: 6.9 10*3/uL (ref 1.7–7.7)
NEUTROS PCT: 76 %
Platelets: 191 10*3/uL (ref 150–400)
RBC: 4.44 MIL/uL (ref 4.22–5.81)
RDW: 12.5 % (ref 11.5–15.5)
WBC: 9.1 10*3/uL (ref 4.0–10.5)

## 2015-06-24 LAB — I-STAT TROPONIN, ED: Troponin i, poc: 0 ng/mL (ref 0.00–0.08)

## 2015-06-24 LAB — APTT: aPTT: 31 seconds (ref 24–37)

## 2015-06-24 LAB — POTASSIUM: Potassium: 4.1 mmol/L (ref 3.5–5.1)

## 2015-06-24 MED ORDER — ASPIRIN 81 MG PO CHEW
324.0000 mg | CHEWABLE_TABLET | Freq: Once | ORAL | Status: AC
  Administered 2015-06-24: 243 mg via ORAL
  Filled 2015-06-24: qty 4

## 2015-06-24 MED ORDER — SODIUM CHLORIDE 0.9 % IV BOLUS (SEPSIS)
1000.0000 mL | Freq: Once | INTRAVENOUS | Status: AC
  Administered 2015-06-24: 1000 mL via INTRAVENOUS

## 2015-06-24 MED ORDER — ADENOSINE 6 MG/2ML IV SOLN
INTRAVENOUS | Status: AC
  Administered 2015-06-24: 6 mg
  Filled 2015-06-24: qty 8

## 2015-06-24 NOTE — ED Provider Notes (Signed)
CSN: [id number]     Arrival date & time 06/24/15  1451 History   First MD Initiated Contact with Patient 06/24/15 1504     Chief Complaint  Patient presents with  . Tachycardia  . Chest Pain     (Consider location/radiation/quality/duration/timing/severity/associated sxs/prior Treatment) HPI  Patient is a 69 year old male with past medical history significant for paroxysmal SVT, hypertension, diabetes, who presents to the emergency department following a presyncopal episode with palpitations. Reports that he had a sudden episode of heart palpitations while he was standing. He felt lightheaded like he was going to pass out, his vision went black but he did not lose consciousness. Episode occurred 1 hour prior to arrival. No falls, no head injury. Attempted some vagal maneuvers without improvement of his heart palpitations. He also reports chest pain with his onset of heart palpitations. He describes substernal chest pressure, nonradiating, constant. Nothing has improved or worsened pain. Has not taken anything for the pain. Associated with nausea, shortness of breath, diaphoresis. He reports that he has taken his metoprolol and has not missed a dose. Drank 4 cups of coffee this morning. He states he continues to have chest pain and lightheadedness upon arrival to the emergency department. States that these symptoms are consistent with his normal SVT episodes.   Past Medical History  Diagnosis Date  . Hypertension   . Eczema   . Psoriasis   . Hearing loss     high frequency  . Cataract of both eyes   . Erectile dysfunction   . Obesity   . S/P appendectomy   . History of inguinal hernia repair, bilateral   . S/P tonsillectomy   . Loss of hearing     high frequency  . Paroxysmal SVT (supraventricular tachycardia) ([place])   . Type II or unspecified type diabetes mellitus without mention of complication, uncontrolled     12-29-14 Told he was prediabetic once   Past Surgical History  Procedure  Laterality Date  . Appendectomy    . Tonsillectomy    . Eye surgery      Catracts removed both eye 30 yrs ago  . Hernia repair      right and left  . Cystoscopy with retrograde pyelogram, ureteroscopy and stent placement Left 12/30/2014    Procedure: CYSTOSCOPY WITH LEFT URETEROSCOPY STONE EXTRACTION WITH STENT;  Surgeon: Franchot Gallo, MD;  Location: [facility];  Service: Urology;  Laterality: Left;  . Holmium laser application Left [id number]    Procedure: HOLMIUM LASER APPLICATION;  Surgeon: Franchot Gallo, MD;  Location: [facility];  Service: Urology;  Laterality: Left;   Family History  Problem Relation Age of Onset  . Cancer Mother     lung  . Diabetes Father   . Thyroid disease Mother   . Lung cancer Mother   . Colon cancer Neg Hx   . GI Bleed Father    Social History  Substance Use Topics  . Smoking status: Former Research scientist (life sciences)  . Smokeless tobacco: Never Used     Comment: Quit 20-30 years ago  . Alcohol Use: 25.2 oz/week    7 Glasses of wine, 28 Cans of beer, 7 Shots of liquor per week     Comment: occasional    Review of Systems  Constitutional: Positive for diaphoresis. Negative for fever and appetite change.  HENT: Negative for congestion.   Eyes: Negative for visual disturbance.  Respiratory: Positive for chest tightness and shortness of breath.   Cardiovascular: Positive for chest pain and  palpitations.  Gastrointestinal: Positive for nausea. Negative for vomiting, abdominal pain and blood in stool.  Genitourinary: Negative for dysuria, hematuria, flank pain and decreased urine volume.  Musculoskeletal: Negative for back pain.  Skin: Negative for rash.  Neurological: Positive for light-headedness. Negative for dizziness, seizures, weakness and headaches.  Psychiatric/Behavioral: Negative for behavioral problems.      Allergies  Peanut-containing drug products and Penicillins  Home Medications   Prior to Admission medications   Medication Sig Start Date End Date  Taking? Authorizing Provider  aspirin 81 MG chewable tablet Chew 81 mg by mouth daily.   Yes Historical Provider, MD  atorvastatin (LIPITOR) 20 MG tablet Take 1 tablet (20 mg total) by mouth daily at 6 PM. 06/11/15  Yes Estela Leonie Green, MD  b complex vitamins tablet Take 1 tablet by mouth daily.   Yes Historical Provider, MD  Cyanocobalamin (VITAMIN B-12 PO) Take 1 tablet by mouth daily.   Yes Historical Provider, MD  fluticasone (FLONASE) 50 MCG/ACT nasal spray Place 2 sprays into both nostrils daily. Patient taking differently: Place 2 sprays into both nostrils daily as needed for allergies.  09/25/14  Yes [name], MD  loratadine (CLARITIN) 10 MG tablet Take 10 mg by mouth daily.   Yes Historical Provider, MD  metoprolol tartrate (LOPRESSOR) 25 MG tablet Take 0.5 tablets (12.5 mg total) by mouth 2 (two) times daily. 06/11/15  Yes Erline Hau, MD  vitamin C (ASCORBIC ACID) 500 MG tablet Take 500 mg by mouth daily.   Yes Historical Provider, MD   BP 134/77 mmHg  Pulse 46  Temp(Src) 98 F (36.7 C) (Oral)  Resp 17  SpO2 98% Physical Exam  Constitutional: He is oriented to person, place, and time. He appears well-developed and well-nourished.  HENT:  Head: Normocephalic and atraumatic.  Mouth/Throat: Oropharynx is clear and moist.  Eyes: Conjunctivae and EOM are normal. Pupils are equal, round, and reactive to light.  Neck: Normal range of motion. No JVD present. No tracheal deviation present.  Cardiovascular: Normal rate, normal heart sounds and intact distal pulses.   No murmur heard. Tachycardic  Pulmonary/Chest: Effort normal and breath sounds normal. No respiratory distress. He has no wheezes. He exhibits no tenderness.  Abdominal: Soft. He exhibits no distension. There is no tenderness. There is no rebound and no guarding.  Musculoskeletal: Normal range of motion.  Neurological: He is alert and oriented to person, place, and time.  Skin: Skin is  warm.  Psychiatric: He has a normal mood and affect.  Nursing note and vitals reviewed.   ED Course  Procedures (including critical care time) Labs Review Labs Reviewed  COMPREHENSIVE METABOLIC PANEL - Abnormal; Notable for the following:    Potassium 5.8 (*)    CO2 20 (*)    Glucose, Bld 150 (*)    Creatinine, Ser 1.43 (*)    AST 74 (*)    Total Bilirubin 1.5 (*)    GFR calc non Af Amer 48 (*)    GFR calc Af Amer 56 (*)    All other components within normal limits  APTT  CBC WITH DIFFERENTIAL/PLATELET  POTASSIUM  CBC WITH DIFFERENTIAL/PLATELET  Randolm Idol, ED    Imaging Review Dg Chest Portable 1 View  06/24/2015  CLINICAL DATA:  SVT episode with chest tightness. Syncope. Scheduled for stress test next Friday. Hx HTN, and diabetes. EXAM: PORTABLE CHEST 1 VIEW COMPARISON:  02/24/2015 FINDINGS: Cardiac silhouette is normal in size and configuration. The aorta is  tortuous. No mediastinal or hilar masses or evidence adenopathy. Mild stable linear scarring or atelectasis in the lung bases. Lungs are otherwise clear. No pleural effusion or pneumothorax. Bony thorax demineralized but grossly intact. IMPRESSION: No acute cardiopulmonary disease. Electronically Signed   By: Lajean Manes M.D.   On: 06/24/2015 15:47   I have personally reviewed and evaluated these images and lab results as part of my medical decision-making.   EKG Interpretation   Date/Time:  Thursday June 24 2015 15:21:33 EST Ventricular Rate:  47 PR Interval:  250 QRS Duration: 92 QT Interval:  385 QTC Calculation: 340 R Axis:   27 Text Interpretation:  Sinus bradycardia Prolonged PR interval Borderline  repolarization abnormality Confirmed by [facility] MD, ERIN ([id number]) on  06/24/2015 3:29:16 PM      MDM   Final diagnoses:  SVT (supraventricular tachycardia) [facility])   Patient is a 69 year old male with past medical history significant for paroxysmal SVT, hypertension, diabetes, who presents to  the emergency department with a presyncopal episode with SVT. On arrival, patient appears uncomfortable. Afebrile, HR 160s, BP 110/70s initially in the room, SpO2 99%. Exam as above, notable for lungs clear to ascultation bilaterally, Tachycardic without a murmur, intact distal pulses, AAOx4.   PIV immediately obtained. Started on 1L bolus NS. EKG with SVT, rate 162, diffuse ST depression, no ST elevation.   Patient's BP decreased to 80/60s, continued to be AAO.  Given that the patient remained AAO without change in mental status, will attempt medical conversion of SVT. Defibrillator pads placed on the patient. Code cart to bedside. Airway cart to bedside.  Patient was hooked up with continuous EKG. Given adenosine 6 mg IV push.  Patient was converted to NSR. Initially bradycardic in the 50s, consistent with prior conversion of SVT. BP improved to 100/70s.   Repeat EKG showed sinus bradycardia, rate 47, normal intervals, no chamber enlargement, no signs of ischemia, no change when compared to prior EKG.   Given the patient's chest pain associated with SVT will obtain labs to evaluate for ACS.  Patient recently hospitalized 2 weeks ago for similar presentation, troponin peaked to 1.17 during that admission, discharged home on metoprolol with outpatient stress test.    Initial troponin 0.00. Cardiology consulted for evaluation for admission for stress test vs cardiac catheretization, evaluated the patient in the emergency department. Recommended outpatient stress test without admission.  Dr. Meda Coffee scheduled stress test for Dec 9th.  CXR showed no acute findings. Lab work showed mild AKI, Cr 1.4 (baseline 1.1), K 5.8. Repeat K 4.1.  Patient was monitored in the emergency department for multiple house. Remained bradycardic in the 50s. On chart review, this was baseline for the patient at time of discharge from the hospital following prior SVT episodes. BP improved to 120s/80s.  Patient stable for discharge  home. Discussed follow up with cardiology for stress testing. Discussed strict return precautions for return to the emergency department. Patient expressed understanding, no questions or concerns at time of discharge.    Nathaniel Man, MD 06/25/15 [id number]  Gareth Morgan, MD 06/27/15 2200  Gareth Morgan, MD 06/27/15 2201

## 2015-06-24 NOTE — ED Notes (Signed)
Pt was recently seen here for SVT.  Pt started having symptoms again.  HR is 164 and bp 59/47, was diaphoretic

## 2015-07-02 ENCOUNTER — Encounter: Payer: Self-pay | Admitting: Adult Health

## 2015-07-02 ENCOUNTER — Encounter: Payer: Self-pay | Admitting: *Deleted

## 2015-07-02 ENCOUNTER — Ambulatory Visit (INDEPENDENT_AMBULATORY_CARE_PROVIDER_SITE_OTHER): Payer: Medicare Other | Admitting: Adult Health

## 2015-07-02 VITALS — BP 118/80 | HR 56 | Ht 70.0 in | Wt 223.0 lb

## 2015-07-02 DIAGNOSIS — R072 Precordial pain: Secondary | ICD-10-CM | POA: Diagnosis not present

## 2015-07-02 NOTE — Progress Notes (Signed)
Cardiology Office Note   Date:  07/02/2015   ID:  Daniel Guerrero, DOB December 29, 1945, MRN [id number]  PCP:  Joycelyn Man, MD  Cardiologist: Woodroe Chen, NP   Chief Complaint  Patient presents with  . Tachycardia    SVT      History of Present Illness: Daniel Guerrero is a 69 y.o. male who presents for post hospital follow up after admission for SVT. He is usually followed by Dr. Gerre Scull in [place]. He was treated with IV metoprolol and returned to NSR. Troponin was found to be negative  He was started on a statin and to be scheduled for an OP stress test.   Echocardiogram Left ventricle: The cavity size was normal. Wall thickness was increased in a pattern of moderate LVH. Systolic function was normal. The estimated ejection fraction was in the range of 60% to 65%. Wall motion was normal; there were no regional wall motion abnormalities. Left ventricular diastolic function parameters were normal for the patient&'s age. - Aortic valve: There was mild regurgitation. - Aorta: Aortic root dimension: 43 mm (ED). - Aortic root: The aortic root was mildly to moderately dilated. - Mitral valve: There was trivial regurgitation. - Right atrium: Central venous pressure (est): 3 mm Hg. - Tricuspid valve: There was trivial regurgitation. - Pulmonary arteries: PA peak pressure: 32 mm Hg (S). - Pericardium, extracardiac: There was no pericardial effusion.  He was seen in the ER on December 06/2015 for recurrent SVT after having drank 4 cups of coffee. He was given adenosine IV and converted to sinus bradycardia. Dr. Meda Coffee was contacted and he was scheduled to see Dr. Rayann Heman on 07/12/2015 for EP evaluation. He has not had the stress test. He has stopped all caffeine.   Past Medical History  Diagnosis Date  . Hypertension   . Eczema   . Psoriasis   . Hearing loss     high frequency  . Cataract of both eyes   . Erectile dysfunction   . Obesity   . S/P  appendectomy   . History of inguinal hernia repair, bilateral   . S/P tonsillectomy   . Loss of hearing     high frequency  . Paroxysmal SVT (supraventricular tachycardia) ([place])   . Type II or unspecified type diabetes mellitus without mention of complication, uncontrolled     12-29-14 Told he was prediabetic once    Past Surgical History  Procedure Laterality Date  . Appendectomy    . Tonsillectomy    . Eye surgery      Catracts removed both eye 30 yrs ago  . Hernia repair      right and left  . Cystoscopy with retrograde pyelogram, ureteroscopy and stent placement Left 12/30/2014    Procedure: CYSTOSCOPY WITH LEFT URETEROSCOPY STONE EXTRACTION WITH STENT;  Surgeon: Franchot Gallo, MD;  Location: [facility];  Service: Urology;  Laterality: Left;  . Holmium laser application Left [id number]    Procedure: HOLMIUM LASER APPLICATION;  Surgeon: Franchot Gallo, MD;  Location: [facility];  Service: Urology;  Laterality: Left;     Current Outpatient Prescriptions  Medication Sig Dispense Refill  . aspirin 81 MG chewable tablet Chew 81 mg by mouth daily.    Marland Kitchen atorvastatin (LIPITOR) 20 MG tablet Take 1 tablet (20 mg total) by mouth daily at 6 PM. 30 tablet 3  . b complex vitamins tablet Take 1 tablet by mouth daily.    . Cyanocobalamin (VITAMIN B-12 PO) Take 1 tablet by mouth  daily.    . fluticasone (FLONASE) 50 MCG/ACT nasal spray Place 2 sprays into both nostrils daily. (Patient taking differently: Place 2 sprays into both nostrils daily as needed for allergies. ) 16 g 6  . loratadine (CLARITIN) 10 MG tablet Take 10 mg by mouth daily.    . metoprolol tartrate (LOPRESSOR) 25 MG tablet Take 0.5 tablets (12.5 mg total) by mouth 2 (two) times daily. 60 tablet 3  . vitamin C (ASCORBIC ACID) 500 MG tablet Take 500 mg by mouth daily.     No current facility-administered medications for this visit.    Allergies:   Peanut-containing drug products and Penicillins    Social History:  The patient   reports that he has quit smoking. He has never used smokeless tobacco. He reports that he drinks about 25.2 oz of alcohol per week. He reports that he does not use illicit drugs.   Family History:  The patient's family history includes Cancer in his mother; Diabetes in his father; GI Bleed in his father; Lung cancer in his mother; Thyroid disease in his mother. There is no history of Colon cancer.    ROS: All other systems are reviewed and negative. Unless otherwise mentioned in H&P    PHYSICAL EXAM: VS:  BP 118/80 mmHg  Pulse 56  Ht 5\' 10"  (1.778 m)  Wt 223 lb (101.152 kg)  BMI 32.00 kg/m2  SpO2 94% , BMI Body mass index is 32 kg/(m^2). GEN: Well nourished, well developed, in no acute distress HEENT: normal Neck: no JVD, carotid bruits, or masses Cardiac: RRR; no murmurs, rubs, or gallops,no edema  Respiratory:  clear to auscultation bilaterally, normal work of breathing GI: soft, nontender, nondistended, + BS MS: no deformity or atrophy Skin: warm and dry, no rash Neuro:  Strength and sensation are intact Psych: euthymic mood, full affect  Recent Labs: 06/10/2015: Magnesium 2.2; TSH 1.572 06/24/2015: ALT 25; BUN 20; Creatinine, Ser 1.43*; Hemoglobin 14.4; Platelets 191; Potassium 4.1; Sodium 139    Lipid Panel    Component Value Date/Time   CHOL 137 06/11/2015 0604   TRIG 109 06/11/2015 0604   HDL 31* 06/11/2015 0604   CHOLHDL 4.4 06/11/2015 0604   VLDL 22 06/11/2015 0604   LDLCALC 84 06/11/2015 0604      Wt Readings from Last 3 Encounters:  07/02/15 223 lb (101.152 kg)  06/10/15 223 lb (101.152 kg)  02/24/15 234 lb 14.4 oz (106.55 kg)      ASSESSMENT AND PLAN:  1. AVNRT: He has had two episodes in the last 3 weeks. He had now stopped all caffeine. He is still taking metoprolol 12.5 mg BID. He is anxious that this may happen again. We have discussed need for stress test to rule out ischemia as cause of arrhythmia. Although unlikely. He will be scheduled on Wed,  July 07, 2015 and will hold metoprolol dose that am. He will keep his appointment with Dr. Rayann Heman.   2. Hypertension: BP is well controlled. Will continue metoprolol only.   3. Hypercholesterolemia: Continue statin therapy.    Current medicines are reviewed at length with the patient today.    Labs/ tests ordered today include: Cardiolite Exercise Stress Test  Orders Placed This Encounter  Procedures  . NM Myocar Multi W/Spect W/Wall Motion / EF     Disposition:   FU with Dr. Rayann Heman. Will see when he is release from EP.   Signed, Jory Sims, NP  07/02/2015 2:14 PM    [facility]  [facility] [address], [place], [place] [address] Phone: [phone]; Fax: [phone]

## 2015-07-02 NOTE — Progress Notes (Signed)
Name: Daniel Guerrero    DOB: 11/25/1945  Age: 69 y.o.  MR#: [id number]       PCP:  Joycelyn Man, MD      Insurance: Payor: MEDICARE / Plan: MEDICARE PART A AND B / Product Type: *No Product type* /   CC:   No chief complaint on file.   VS Filed Vitals:   07/02/15 1252  BP: 118/80  Pulse: 56  Height: 5\' 10"  (1.778 m)  Weight: 223 lb (101.152 kg)  SpO2: 94%    Weights Current Weight  07/02/15 223 lb (101.152 kg)  06/10/15 223 lb (101.152 kg)  02/24/15 234 lb 14.4 oz (106.55 kg)    Blood Pressure  BP Readings from Last 3 Encounters:  07/02/15 118/80  06/24/15 134/77  06/11/15 136/67     Admit date:  (Not on file) Last encounter with RMR:  Visit date not found   Allergy Peanut-containing drug products and Penicillins  Current Outpatient Prescriptions  Medication Sig Dispense Refill  . aspirin 81 MG chewable tablet Chew 81 mg by mouth daily.    Marland Kitchen atorvastatin (LIPITOR) 20 MG tablet Take 1 tablet (20 mg total) by mouth daily at 6 PM. 30 tablet 3  . b complex vitamins tablet Take 1 tablet by mouth daily.    . Cyanocobalamin (VITAMIN B-12 PO) Take 1 tablet by mouth daily.    . fluticasone (FLONASE) 50 MCG/ACT nasal spray Place 2 sprays into both nostrils daily. (Patient taking differently: Place 2 sprays into both nostrils daily as needed for allergies. ) 16 g 6  . loratadine (CLARITIN) 10 MG tablet Take 10 mg by mouth daily.    . metoprolol tartrate (LOPRESSOR) 25 MG tablet Take 0.5 tablets (12.5 mg total) by mouth 2 (two) times daily. 60 tablet 3  . vitamin C (ASCORBIC ACID) 500 MG tablet Take 500 mg by mouth daily.     No current facility-administered medications for this visit.    Discontinued Meds:   There are no discontinued medications.  Patient Active Problem List   Diagnosis Date Noted  . Sinus bradycardia 06/10/2015  . SVT (supraventricular tachycardia) ([place]) 06/10/2015  . Troponin level elevated   . Asthma with acute exacerbation 11/09/2014  .  Bleeding nose 09/28/2014  . Benign paroxysmal positional vertigo 04/16/2014  . LLQ pain 01/15/2014  . Hypertension   . Eczema   . Erectile dysfunction   . History of inguinal hernia repair, bilateral   . Paroxysmal SVT (supraventricular tachycardia) ([place]) 11/11/2012  . Headache, common migraine, intractable 09/09/2010  . DYSPHAGIA 08/04/2010  . Allergic rhinitis 08/01/2010  . Overweight(278.02) 12/13/2007  . MIXED HEARING LOSS BILATERAL 12/13/2007  . CONTACT DERMATITIS&OTH ECZEMA DUE OTH [place] AGENT 12/13/2007  . PSORIASIS 12/13/2007    LABS    Component Value Date/Time   NA 139 06/24/2015 1555   NA 141 06/10/2015 0702   NA 140 06/10/2015 0143   K 4.1 06/24/2015 1747   K 5.8* 06/24/2015 1555   K 5.0 06/10/2015 0702   CL 106 06/24/2015 1555   CL 111 06/10/2015 0702   CL 106 06/10/2015 0143   CO2 20* 06/24/2015 1555   CO2 28 06/10/2015 0702   CO2 25 06/10/2015 0143   GLUCOSE 150* 06/24/2015 1555   GLUCOSE 123* 06/10/2015 0702   GLUCOSE 151* 06/10/2015 0143   BUN 20 06/24/2015 1555   BUN 27* 06/10/2015 0702   BUN 32* 06/10/2015 0143   CREATININE 1.43* 06/24/2015 1555   CREATININE 1.15 06/10/2015 [id number]  CREATININE 1.19 06/10/2015 0143   CALCIUM 9.4 06/24/2015 1555   CALCIUM 8.2* 06/10/2015 0702   CALCIUM 9.0 06/10/2015 0143   GFRNONAA 48* 06/24/2015 1555   GFRNONAA >60 06/10/2015 0702   GFRNONAA >60 06/10/2015 0143   GFRAA 56* 06/24/2015 1555   GFRAA >60 06/10/2015 0702   GFRAA >60 06/10/2015 0143   CMP     Component Value Date/Time   NA 139 06/24/2015 1555   K 4.1 06/24/2015 1747   CL 106 06/24/2015 1555   CO2 20* 06/24/2015 1555   GLUCOSE 150* 06/24/2015 1555   BUN 20 06/24/2015 1555   CREATININE 1.43* 06/24/2015 1555   CALCIUM 9.4 06/24/2015 1555   PROT 6.6 06/24/2015 1555   ALBUMIN 4.4 06/24/2015 1555   AST 74* 06/24/2015 1555   ALT 25 06/24/2015 1555   ALKPHOS 72 06/24/2015 1555   BILITOT 1.5* 06/24/2015 1555   GFRNONAA 48* 06/24/2015 1555   GFRAA  56* 06/24/2015 1555       Component Value Date/Time   WBC 9.1 06/24/2015 1625   WBC 8.0 06/11/2015 0604   WBC 9.9 06/10/2015 0702   HGB 14.4 06/24/2015 1625   HGB 13.6 06/11/2015 0604   HGB 13.8 06/10/2015 0702   HCT 41.4 06/24/2015 1625   HCT 39.3 06/11/2015 0604   HCT 39.9 06/10/2015 0702   MCV 93.2 06/24/2015 1625   MCV 95.2 06/11/2015 0604   MCV 95.2 06/10/2015 0702    Lipid Panel     Component Value Date/Time   CHOL 137 06/11/2015 0604   TRIG 109 06/11/2015 0604   HDL 31* 06/11/2015 0604   CHOLHDL 4.4 06/11/2015 0604   VLDL 22 06/11/2015 0604   LDLCALC 84 06/11/2015 0604    ABG No results found for: PHART, PCO2ART, PO2ART, HCO3, TCO2, ACIDBASEDEF, O2SAT   Lab Results  Component Value Date   TSH 1.572 06/10/2015   BNP (last 3 results) No results for input(s): BNP in the last 8760 hours.  ProBNP (last 3 results) No results for input(s): PROBNP in the last 8760 hours.  Cardiac Panel (last 3 results) No results for input(s): CKTOTAL, CKMB, TROPONINI, RELINDX in the last 72 hours.  Iron/TIBC/Ferritin/ %Sat No results found for: IRON, TIBC, FERRITIN, IRONPCTSAT   EKG Orders placed or performed during the hospital encounter of 06/24/15  . EKG 12-Lead  . EKG 12-Lead  . EKG 12-Lead  . EKG 12-Lead  . EKG     Prior Assessment and Plan Problem List as of 07/02/2015      Cardiovascular and Mediastinum   Headache, common migraine, intractable   Paroxysmal SVT (supraventricular tachycardia) ([place])   Hypertension   Last Assessment & Plan 01/15/2014 Office Visit Written 01/15/2014  9:44 PM by Biagio Borg, MD    stable overall by history and exam, recent data reviewed with pt, and pt to continue medical treatment as before,  to f/u any worsening symptoms or concerns BP Readings from Last 3 Encounters:  01/15/14 120/72  12/18/13 120/84  07/28/13 158/90         Sinus bradycardia   SVT (supraventricular tachycardia) ([facility])     Respiratory   Allergic rhinitis    Bleeding nose   Asthma with acute exacerbation     Digestive   DYSPHAGIA     Nervous and Auditory   MIXED HEARING LOSS BILATERAL   Benign paroxysmal positional vertigo     Musculoskeletal and Integument   CONTACT DERMATITIS&OTH ECZEMA DUE OTH [facility] AGENT   PSORIASIS  Eczema     Genitourinary   Erectile dysfunction     Other   Overweight(278.02)   LLQ pain   Last Assessment & Plan 01/15/2014 Office Visit Written 01/15/2014  4:56 PM by Biagio Borg, MD    ? uti vs diverticulitis vs other - non toxic appearing but warm, with mild tender LLQ - for lab/urine studies,  Blood cx, empiric levaquin/flagyl, f/u with PCP in 3-5 days, will hold on imaging at this time but would consider CT for any worsening, and likely should have colonoscopy soon as last was approx 10 yrs and he is due at least for screening purpose      History of inguinal hernia repair, bilateral   Troponin level elevated       Imaging: Dg Chest Portable 1 View  06/24/2015  CLINICAL DATA:  SVT episode with chest tightness. Syncope. Scheduled for stress test next Friday. Hx HTN, and diabetes. EXAM: PORTABLE CHEST 1 VIEW COMPARISON:  02/24/2015 FINDINGS: Cardiac silhouette is normal in size and configuration. The aorta is tortuous. No mediastinal or hilar masses or evidence adenopathy. Mild stable linear scarring or atelectasis in the lung bases. Lungs are otherwise clear. No pleural effusion or pneumothorax. Bony thorax demineralized but grossly intact. IMPRESSION: No acute cardiopulmonary disease. Electronically Signed   By: Lajean Manes M.D.   On: 06/24/2015 15:47

## 2015-07-02 NOTE — Patient Instructions (Signed)
Your physician recommends that you schedule a follow-up appointment with Dr. Rayann Heman on 08/11/14  Your physician has requested that you have en exercise stress myoview. For further information please visit [email or web address]. Please follow instruction sheet, as given.  If you need a refill on your cardiac medications before your next appointment, please call your pharmacy.  Thank you for choosing [facility]!

## 2015-07-07 ENCOUNTER — Inpatient Hospital Stay (HOSPITAL_COMMUNITY): Admission: RE | Admit: 2015-07-07 | Payer: Medicare Other | Source: Ambulatory Visit

## 2015-07-07 ENCOUNTER — Encounter (HOSPITAL_COMMUNITY)
Admission: RE | Admit: 2015-07-07 | Discharge: 2015-07-07 | Disposition: A | Discharge status: Home / Self Care | Payer: Medicare Other | Source: Ambulatory Visit | Attending: Adult Health | Admitting: Adult Health

## 2015-07-07 ENCOUNTER — Encounter (HOSPITAL_COMMUNITY): Payer: Self-pay

## 2015-07-07 ENCOUNTER — Other Ambulatory Visit: Payer: Self-pay | Admitting: Adult Health

## 2015-07-07 DIAGNOSIS — R072 Precordial pain: Secondary | ICD-10-CM

## 2015-07-07 MED ORDER — TECHNETIUM TC 99M SESTAMIBI - CARDIOLITE: 30.0000 | Freq: Once | INTRAVENOUS | Status: DC | PRN

## 2015-07-07 MED ORDER — REGADENOSON 0.4 MG/5ML IV SOLN
INTRAVENOUS | Status: AC
  Filled 2015-07-07: qty 5

## 2015-07-07 MED ORDER — TECHNETIUM TC 99M SESTAMIBI GENERIC - CARDIOLITE
10.0000 | Freq: Once | INTRAVENOUS | Status: AC | PRN
  Administered 2015-07-07: 10 via INTRAVENOUS

## 2015-07-07 MED ORDER — SODIUM CHLORIDE 0.9 % IJ SOLN
INTRAMUSCULAR | Status: AC
  Administered 2015-07-07: 11:00:00 via INTRAVENOUS
  Filled 2015-07-07: qty 3

## 2015-07-12 ENCOUNTER — Other Ambulatory Visit: Payer: Medicare Other

## 2015-07-12 ENCOUNTER — Ambulatory Visit (INDEPENDENT_AMBULATORY_CARE_PROVIDER_SITE_OTHER): Payer: Medicare Other | Admitting: Internal Medicine

## 2015-07-12 ENCOUNTER — Encounter: Payer: Self-pay | Admitting: Internal Medicine

## 2015-07-12 VITALS — BP 106/78 | HR 50 | Ht 70.0 in | Wt 224.6 lb

## 2015-07-12 DIAGNOSIS — I471 Supraventricular tachycardia: Secondary | ICD-10-CM | POA: Diagnosis not present

## 2015-07-12 DIAGNOSIS — R1900 Intra-abdominal and pelvic swelling, mass and lump, unspecified site: Secondary | ICD-10-CM | POA: Diagnosis not present

## 2015-07-12 DIAGNOSIS — I77810 Thoracic aortic ectasia: Secondary | ICD-10-CM | POA: Diagnosis not present

## 2015-07-12 LAB — BASIC METABOLIC PANEL
BUN: 19 mg/dL (ref 7–25)
CALCIUM: 8.8 mg/dL (ref 8.6–10.3)
CHLORIDE: 105 mmol/L (ref 98–110)
CO2: 23 mmol/L (ref 20–31)
Creat: 0.95 mg/dL (ref 0.70–1.25)
GLUCOSE: 94 mg/dL (ref 65–99)
POTASSIUM: 4.4 mmol/L (ref 3.5–5.3)
SODIUM: 136 mmol/L (ref 135–146)

## 2015-07-12 NOTE — Progress Notes (Signed)
Electrophysiology Office Note Date: 07/12/2015  ID:  Daniel Guerrero, DOB 1946-05-28, MRN [id number]  PCP: Joycelyn Man, MD Primary Cardiologist: Lowella Petties Referring MD: Bronson Ing  Reason for Consult: SVT  Daniel Guerrero is a 69 y.o. male seen today at the request of Dr Bronson Ing for evaluation of SVT.  He reports that he has had palpitations since the age of 65 that historically self terminated.  About 4 years ago, he developed persistent tachypalpitations for which he went to [facility] and was given adenosine which terminated episode. He has since had 2 episodes in the last several weeks for which he was evaluated in the ER and received adenosine.  He reports doing well today.  He is fearful of recurrent episodes and has decreased his activity.  He did have an episode of syncope with last episode of SVT.  He rides his bike 2 days per week without functional limitations. He denies chest pain, dyspnea, PND, orthopnea, nausea, vomiting, dizziness, edema, weight gain, or early satiety.  Echo 06/10/15 demonstrated EF 60-65%, mildly to moderately dilated aortic root, LA 40.  Myoview stress not able to be done 2/2 PVC's.   Past Medical History  Diagnosis Date  . Hypertension   . Eczema   . Psoriasis   . Hearing loss     high frequency  . Cataract of both eyes   . Erectile dysfunction   . Obesity   . S/P appendectomy   . History of inguinal hernia repair, bilateral   . S/P tonsillectomy   . Loss of hearing     high frequency  . Paroxysmal SVT (supraventricular tachycardia) ([place])   . Type II or unspecified type diabetes mellitus without mention of complication, uncontrolled     12-29-14 Told he was prediabetic once   Past Surgical History  Procedure Laterality Date  . Appendectomy    . Tonsillectomy    . Eye surgery      Catracts removed both eye 30 yrs ago  . Hernia repair      right and left  . Cystoscopy with retrograde pyelogram, ureteroscopy and stent  placement Left 12/30/2014    Procedure: CYSTOSCOPY WITH LEFT URETEROSCOPY STONE EXTRACTION WITH STENT;  Surgeon: Franchot Gallo, MD;  Location: [facility];  Service: Urology;  Laterality: Left;  . Holmium laser application Left [id number]    Procedure: HOLMIUM LASER APPLICATION;  Surgeon: Franchot Gallo, MD;  Location: [facility];  Service: Urology;  Laterality: Left;    Current Outpatient Prescriptions  Medication Sig Dispense Refill  . aspirin 81 MG chewable tablet Chew 81 mg by mouth daily.    Marland Kitchen atorvastatin (LIPITOR) 20 MG tablet Take 1 tablet (20 mg total) by mouth daily at 6 PM. 30 tablet 3  . azithromycin (ZITHROMAX) 250 MG tablet Take 250 mg by mouth daily.    Marland Kitchen b complex vitamins tablet Take 1 tablet by mouth daily.    . Cyanocobalamin (VITAMIN B-12 PO) Take 1 tablet by mouth daily.    . fluticasone (FLONASE) 50 MCG/ACT nasal spray Place 2 sprays into both nostrils daily as needed for allergies or rhinitis.    Marland Kitchen loratadine (CLARITIN) 10 MG tablet Take 10 mg by mouth daily.    . metoprolol tartrate (LOPRESSOR) 25 MG tablet Take 0.5 tablets (12.5 mg total) by mouth 2 (two) times daily. 60 tablet 3  . vitamin C (ASCORBIC ACID) 500 MG tablet Take 500 mg by mouth daily.     No current facility-administered medications for  this visit.   Facility-Administered Medications Ordered in Other Visits  Medication Dose Route Frequency Provider Last Rate Last Dose  . technetium sestamibi (CARDIOLITE) injection 30 milli Curie  30 milli Curie Intravenous Once PRN Lendon Colonel, NP        Allergies:   Peanut-containing drug products and Penicillins   Social History: Social History   Social History  . Marital Status: Married    Spouse Name: N/A  . Number of Children: N/A  . Years of Education: N/A   Occupational History  . Not on file.   Social History Main Topics  . Smoking status: Former Research scientist (life sciences)  . Smokeless tobacco: Never Used     Comment: Quit 20-30 years ago  . Alcohol Use: 25.2  oz/week    7 Glasses of wine, 28 Cans of beer, 7 Shots of liquor per week     Comment: occasional  . Drug Use: No  . Sexual Activity: Not on file   Other Topics Concern  . Not on file   Social History Narrative   Daily Caffeine use: 5-6 drinks daily   Exercising 2 times per week   No Drug use             Family History: Family History  Problem Relation Age of Onset  . Cancer Mother     lung  . Diabetes Father   . Thyroid disease Mother   . Lung cancer Mother   . Colon cancer Neg Hx   . GI Bleed Father     Review of Systems: All other systems reviewed and are otherwise negative except as noted above.   Physical Exam: VS:  BP 106/78 mmHg  Pulse 50  Ht 5\' 10"  (1.778 m)  Wt 224 lb 9.6 oz (101.878 kg)  BMI 32.23 kg/m2 , BMI Body mass index is 32.23 kg/(m^2). Wt Readings from Last 3 Encounters:  07/12/15 224 lb 9.6 oz (101.878 kg)  07/02/15 223 lb (101.152 kg)  06/10/15 223 lb (101.152 kg)    GEN- The patient is elderly and obese appearing, alert and oriented x 3 today.   HEENT: normocephalic, atraumatic; sclera clear, conjunctiva pink; hearing intact; oropharynx clear; neck supple Lungs- Clear to ausculation bilaterally, normal work of breathing.  No wheezes, rales, rhonchi Heart- Regular rate and rhythm, no murmurs, rubs or gallops GI- obese, non-tender, non-distended, bowel sounds present Extremities- no clubbing, cyanosis, or edema; DP/PT/radial pulses 1+ bilaterally MS- no significant deformity or atrophy Skin- warm and dry, no rash or lesion  Psych- euthymic mood, full affect Neuro- strength and sensation are intact   EKG:  EKG is ordered today. The ekg ordered today shows sinus bradycardia with 1st degree AV block (PR 230 msec)  Recent Labs: 06/10/2015: Magnesium 2.2; TSH 1.572 06/24/2015: ALT 25; BUN 20; Creatinine, Ser 1.43*; Hemoglobin 14.4; Platelets 191; Potassium 4.1; Sodium 139    Other studies Reviewed: Additional studies/ records that were  reviewed today include: ER records, echo   Assessment and Plan: 1.  Short RP tachycardia The patient has recurrent adenosine sensitive short RP tachycardia.  He has been placed on Metoprolol but is now bradycardic and unable to generate HR as before.  Risks, benefits to ablation were reviewed with the patient and his wife who wish to proceed.  Will use CARTO and anesthesia  2.  Enlarged aortic root by echo Will need to evaluate with CT  Pt also has family history of AAA rupture, will check abdominal ultrasound  Current medicines are reviewed  at length with the patient today.   The patient does not have concerns regarding his medicines.  The following changes were made today:  none  Labs/ tests ordered today include: BMET, thoracic CT, abdominal ultrasound    Disposition:   Follow up after ablation   Signed, Thompson Grayer, MD  07/12/2015 11:05 AM   [place] [address] [phone] (office) [phone] (fax)

## 2015-07-12 NOTE — Patient Instructions (Addendum)
Medication Instructions:  Your physician recommends that you continue on your current medications as directed. Please refer to the Current Medication list given to you today.   Labwork: Your physician recommends that you return for lab work today: BMP and again on 08/17/15--do not have to be fasting    Testing/Procedures: Non-Cardiac CT scanning, (CAT scanning), is a noninvasive, special x-ray that produces cross-sectional images of the body using x-rays and a computer. CT scans help physicians diagnose and treat medical conditions. For some CT exams, a contrast material is used to enhance visibility in the area of the body being studied. CT scans provide greater clarity and reveal more details than regular x-ray exams.  Your physician has requested that you have an abdominal aorta duplex. During this test, an ultrasound is used to evaluate the aorta. Allow 30 minutes for this exam. Do not eat after midnight the day before and avoid carbonated beverages Your physician has recommended that you have an ablation. Catheter ablation is a medical procedure used to treat some cardiac arrhythmias (irregular heartbeats). During catheter ablation, a long, thin, flexible tube is put into a blood vessel in your groin (upper thigh), or neck. This tube is called an ablation catheter. It is then guided to your heart through the blood vessel. Radio frequency waves destroy small areas of heart tissue where abnormal heartbeats may cause an arrhythmia to start. Please see the instruction sheet given to you today.  Your physician has recommended that you have an ablation. Catheter ablation is a medical procedure used to treat some cardiac arrhythmias (irregular heartbeats). During catheter ablation, a long, thin, flexible tube is put into a blood vessel in your groin (upper thigh), or neck. This tube is called an ablation catheter. It is then guided to your heart through the blood vessel. Radio frequency waves destroy  small areas of heart tissue where abnormal heartbeats may cause an arrhythmia to start. Please see the instruction sheet given to you today.---Scheduled for 08/24/15 at 7:30am.  Please check into the [facility] Entrance at 5:30am.  Do not eat or drink after midnight and do not take any medications the morning of your procedure       Follow-Up:  Your physician recommends that you schedule a follow-up appointment 4 weeks from 08/24/15 after ablation with Dr Rayann Heman   Any Other Special Instructions Will Be Listed Below (If Applicable).     If you need a refill on your cardiac medications before your next appointment, please call your pharmacy.

## 2015-07-15 ENCOUNTER — Other Ambulatory Visit: Payer: Self-pay | Admitting: Internal Medicine

## 2015-07-15 ENCOUNTER — Ambulatory Visit (HOSPITAL_COMMUNITY)
Admission: RE | Admit: 2015-07-15 | Discharge: 2015-07-15 | Disposition: A | Discharge status: Home / Self Care | Payer: Medicare Other | Source: Ambulatory Visit | Attending: Cardiovascular Disease | Admitting: Cardiovascular Disease

## 2015-07-15 ENCOUNTER — Ambulatory Visit (INDEPENDENT_AMBULATORY_CARE_PROVIDER_SITE_OTHER)
Admission: RE | Admit: 2015-07-15 | Discharge: 2015-07-15 | Disposition: A | Discharge status: Home / Self Care | Payer: Medicare Other | Source: Ambulatory Visit | Attending: Internal Medicine | Admitting: Internal Medicine

## 2015-07-15 DIAGNOSIS — R1900 Intra-abdominal and pelvic swelling, mass and lump, unspecified site: Secondary | ICD-10-CM

## 2015-07-15 DIAGNOSIS — IMO0001 Reserved for inherently not codable concepts without codable children: Secondary | ICD-10-CM

## 2015-07-15 DIAGNOSIS — I714 Abdominal aortic aneurysm, without rupture, unspecified: Secondary | ICD-10-CM

## 2015-07-15 DIAGNOSIS — I10 Essential (primary) hypertension: Secondary | ICD-10-CM | POA: Insufficient documentation

## 2015-07-15 DIAGNOSIS — E119 Type 2 diabetes mellitus without complications: Secondary | ICD-10-CM | POA: Diagnosis not present

## 2015-07-15 DIAGNOSIS — Z136 Encounter for screening for cardiovascular disorders: Secondary | ICD-10-CM | POA: Diagnosis not present

## 2015-07-15 DIAGNOSIS — Z8249 Family history of ischemic heart disease and other diseases of the circulatory system: Secondary | ICD-10-CM | POA: Diagnosis not present

## 2015-07-15 DIAGNOSIS — Z87891 Personal history of nicotine dependence: Secondary | ICD-10-CM | POA: Insufficient documentation

## 2015-07-15 DIAGNOSIS — I77810 Thoracic aortic ectasia: Secondary | ICD-10-CM | POA: Diagnosis not present

## 2015-07-15 MED ORDER — IOHEXOL 350 MG/ML SOLN
100.0000 mL | Freq: Once | INTRAVENOUS | Status: AC | PRN
  Administered 2015-07-15: 100 mL via INTRAVENOUS

## 2015-07-30 ENCOUNTER — Other Ambulatory Visit: Payer: Self-pay | Admitting: *Deleted

## 2015-07-30 DIAGNOSIS — R1900 Intra-abdominal and pelvic swelling, mass and lump, unspecified site: Secondary | ICD-10-CM

## 2015-07-30 DIAGNOSIS — I77810 Thoracic aortic ectasia: Secondary | ICD-10-CM

## 2015-08-06 ENCOUNTER — Encounter: Payer: Self-pay | Admitting: Internal Medicine

## 2015-08-09 ENCOUNTER — Encounter: Payer: Medicare Other | Admitting: Cardiothoracic Surgery

## 2015-08-11 ENCOUNTER — Institutional Professional Consult (permissible substitution) (INDEPENDENT_AMBULATORY_CARE_PROVIDER_SITE_OTHER): Payer: Medicare Other | Admitting: Cardiothoracic Surgery

## 2015-08-11 ENCOUNTER — Encounter: Payer: Medicare Other | Admitting: Cardiothoracic Surgery

## 2015-08-11 ENCOUNTER — Encounter: Payer: Self-pay | Admitting: Cardiothoracic Surgery

## 2015-08-11 VITALS — BP 163/81 | HR 66 | Resp 20 | Ht 70.0 in | Wt 220.0 lb

## 2015-08-11 DIAGNOSIS — I77810 Thoracic aortic ectasia: Secondary | ICD-10-CM | POA: Diagnosis not present

## 2015-08-11 NOTE — Progress Notes (Addendum)
[address]       [place] [address]             [phone]                    Topher S Altice Damar Medical Record [id number] Date of Birth: 04/07/1946  Referring: Thompson Grayer, MD Primary Care: Joycelyn Man, MD  Chief Complaint:    Chief Complaint  Patient presents with  . Thoracic Aortic Dissection    Surgical eval, CTA Chest 07/15/15, ECHO 06/10/15    History of Present Illness:    FROILAN GREENLAW 70 y.o. male is seen in the office  today for for mildly dilated aortic root found incidentally on an echocardiogram. Patient notes that he's had episodes of fast heart rate since his teensJohn GORDON TRISLER is a 70 y.o. male seen today at the request of Dr Bronson Ing for evaluation of SVT. He reports that he has had palpitations since the age of 89 that historically self terminated. 4 years ago, he developed persistent tachypalpitations for which he went to [facility] and was given adenosine which terminated episode. Marland Kitchen He reports doing well today.  He denies chest pain, dyspnea, PND, orthopnea, nausea, vomiting, dizziness, edema, weight gain.  Echo 06/10/15 demonstrated EF 60-65%, mildly to moderately dilated aortic root, LA 40. Myoview stress not able to be done 2/2 PVC's. . 2 years ago the episodes became more frequent and prolonged necessitating a visit to the emergency room  Because of the mildly dilated aortic root on echocardiogram a CTA of the chest was performed and the patient was referred to cardiac surgery.   His echocardiogram demonstrates a trileaflet aortic valve.  He has no family history of aortic dissection or sudden death at a young age of unexplained cause. His father did not of her ruptured abdominal aneurysm at age 79, and nonoperative treatment was decided upon.  Current Activity/ Functional Status:  Patient is independent with mobility/ambulation, transfers, ADL's, IADL's.   Zubrod Score: At the time of surgery  this patient's most appropriate activity status/level should be described as: []     0    Normal activity, no symptoms [x]     1    Restricted in physical strenuous activity but ambulatory, able to do out light work []     2    Ambulatory and capable of self care, unable to do work activities, up and about               >50 % of waking hours                              []     3    Only limited self care, in bed greater than 50% of waking hours []     4    Completely disabled, no self care, confined to bed or chair []     5    Moribund   Past Medical History  Diagnosis Date  . Hypertension   . Eczema   . Psoriasis   . Hearing loss     high frequency  . Cataract of both eyes   . Erectile dysfunction   . Obesity   . S/P appendectomy   . History of inguinal hernia repair, bilateral   . S/P tonsillectomy   . Loss of hearing     high frequency  . Paroxysmal SVT (supraventricular tachycardia) ([place])   .  Type II or unspecified type diabetes mellitus without mention of complication, uncontrolled     12-29-14 Told he was prediabetic once    Past Surgical History  Procedure Laterality Date  . Appendectomy    . Tonsillectomy    . Eye surgery      Catracts removed both eye 30 yrs ago  . Hernia repair      right and left  . Cystoscopy with retrograde pyelogram, ureteroscopy and stent placement Left 12/30/2014    Procedure: CYSTOSCOPY WITH LEFT URETEROSCOPY STONE EXTRACTION WITH STENT;  Surgeon: Franchot Gallo, MD;  Location: [facility];  Service: Urology;  Laterality: Left;  . Holmium laser application Left [id number]    Procedure: HOLMIUM LASER APPLICATION;  Surgeon: Franchot Gallo, MD;  Location: [facility];  Service: Urology;  Laterality: Left;    Family History  Problem Relation Age of Onset  . Cancer Mother     lung  . Diabetes Father   . Thyroid disease Mother   . Lung cancer Mother   . Colon cancer Neg Hx   . GI Bleed Father    Patient's mother died at age 56 of lung cancer he has one  sister and one brother are healthy Social History   Social History  . Marital Status: Married    Spouse Name: N/A  . Number of Children: N/A  . Years of Education: N/A   Occupational History  .  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 Smoking status: Former Research scientist (life sciences)  . Smokeless tobacco: Never Used     Comment: Quit 20-30 years ago  . Alcohol Use: 25.2 oz/week    7 Glasses of wine, 28 Cans of beer, 7 Shots of liquor per week     Comment: occasional  . Drug Use: No  . Sexual Activity: Not on file          Social History Narrative   Daily Caffeine use: 5-6 drinks daily   Exercising 2 times per week   No Drug use             History  Smoking status  . Former Smoker  Smokeless tobacco  . Never Used    Comment: Quit 20-30 years ago    History  Alcohol Use  . 25.2 oz/week  . 7 Glasses of wine, 28 Cans of beer, 7 Shots of liquor per week    Comment: occasional     Allergies  Allergen Reactions  . Peanut-Containing Drug Products Hives  . Penicillins Hives    Has patient had a PCN reaction causing immediate rash, facial/tongue/throat swelling, SOB or lightheadedness with hypotension: No Has patient had a PCN reaction causing severe rash involving mucus membranes or skin necrosis: No Has patient had a PCN reaction that required hospitalization No Has patient had a PCN reaction occurring within the last 10 years: No If all of the above answers are "NO", then may proceed with Cephalosporin use.    Current Outpatient Prescriptions  Medication Sig Dispense Refill  . aspirin 81 MG chewable tablet Chew 81 mg by mouth daily.    Marland Kitchen atorvastatin (LIPITOR) 20 MG tablet Take 1 tablet (20 mg total) by mouth daily at 6 PM. 30 tablet 3  . azithromycin (ZITHROMAX) 250 MG tablet Take 250 mg by mouth daily.    Marland Kitchen b complex vitamins tablet Take 1 tablet by mouth daily.    . Cyanocobalamin (VITAMIN B-12 PO) Take 1 tablet  by mouth daily.    . fluticasone (FLONASE) 50 MCG/ACT nasal spray Place 2 sprays into both nostrils daily as needed for allergies or rhinitis.    Marland Kitchen loratadine (CLARITIN) 10 MG tablet Take 10 mg by mouth daily.    . metoprolol tartrate (LOPRESSOR) 25 MG tablet Take 0.5 tablets (12.5 mg total) by mouth 2 (two) times daily. 60 tablet 3  . vitamin C (ASCORBIC ACID) 500 MG tablet Take 500 mg by mouth daily.     No current facility-administered medications for this visit.      Review of Systems:     Cardiac Review of Systems: Y or N  Chest Pain [ n  ]  Resting SOB [ n  ] Exertional SOB  Blue.Reese  ]  Vertell Limber Florencio.Farrier  ]   Pedal Edema Florencio.Farrier   ]    Palpitations Blue.Reese  ] Syncope  [one episode  ]   Presyncope [ n  ]  General Review of Systems: [Y] = yes [  ]=no Constitional: recent weight change [  ];  Wt loss over the last 3 months [   ] anorexia [  ]; fatigue [  ]; nausea [  ]; night sweats [  ]; fever [  ]; or chills [  ];          Dental: poor dentition[ n ]; Last Dentist visit:   Eye : blurred vision [  ]; diplopia [   ]; vision changes [  ];  Amaurosis fugax[  ]; Resp: cough [  ];  wheezing[  ];  hemoptysis[  ]; shortness of breath[  ]; paroxysmal nocturnal dyspnea[  ]; dyspnea on exertion[  ]; or orthopnea[  ];  GI:  gallstones[  ], vomiting[  ];  dysphagia[  ]; melena[  ];  hematochezia [  ]; heartburn[  ];   Hx of  Colonoscopy[ 10 years ago ]; GU: kidney stones [  ]; hematuria[  ];   dysuria [  ];  nocturia[  ];  history of     obstruction [  ]; urinary frequency [  ]             Skin: rash, swelling[  ];, hair loss[  ];  peripheral edema[  ];  or itching[  ]; Musculosketetal: myalgias[  ];  joint swelling[  ];  joint erythema[  ];  joint pain[  ];  back pain[  ];  Heme/Lymph: bruising[  ];  bleeding[  ];  anemia[  ];  Neuro: TIA[  ];  headaches[  ];  stroke[  ];  vertigo[  ];  seizures[  ];   paresthesias[  ];  difficulty walking[  ];  Psych:depression[  ]; anxiety[  ];  Endocrine: diabetes[  ];  thyroid  dysfunction[  ];  Immunizations: Flu up to date [ y ]; Pneumococcal up to date [ n ];  Other:  Physical Exam: BP 163/81 mmHg  Pulse 66  Resp 20  Ht 5\' 10"  (1.778 m)  Wt 220 lb (99.791 kg)  BMI 31.57 kg/m2  SpO2 96%  PHYSICAL EXAMINATION: General appearance: alert, cooperative, appears stated age and no distress Head: Normocephalic, without obvious abnormality, atraumatic Neck: no adenopathy, no carotid bruit, no JVD, supple, symmetrical, trachea midline and thyroid not enlarged, symmetric, no tenderness/mass/nodules Lymph nodes: Cervical, supraclavicular, and axillary nodes normal. Resp: clear to auscultation bilaterally Back: symmetric, no curvature. ROM normal. No CVA tenderness. Cardio: regular rate and rhythm, S1, S2 normal, no murmur, click, rub  or gallop GI: soft, non-tender; bowel sounds normal; no masses,  no organomegaly Extremities: extremities normal, atraumatic, no cyanosis or edema and Homans sign is negative, no sign of DVT Neurologic: Grossly normal Patient has 2+ DP and PT pulses bilaterally  Diagnostic Studies & Laboratory data:     Recent Radiology Findings:   Ct Angio Chest Aorta W/cm &/or Wo/cm  07/15/2015  CLINICAL DATA:  Evaluate aortic root dilatation pre EXAM: CT ANGIOGRAPHY CHEST WITH CONTRAST TECHNIQUE: Multidetector CT imaging of the chest was performed using the standard protocol during bolus administration of intravenous contrast. Multiplanar CT image reconstructions and MIPs were obtained to evaluate the vascular anatomy. CONTRAST:  136mL OMNIPAQUE IOHEXOL 350 MG/ML SOLN COMPARISON:  08/02/2010 FINDINGS: Lungs are clear.  Airways are normal. Heart size is normal. There is calcified plaque over the left anterior descending and right coronary arteries. Minimal calcified plaque over the thoracic aorta. The aortic root measures 3.9 cm on the coronal reformatted images and 4.2 cm AP diameter on the axial images which is borderline/mildly dilated. Aorta at the  sinotubular junction measures 3.1 cm compatible with minimal ectasia. Mid ascending thoracic aorta measures 3 cm which is normal. The aortic arch and descending thoracic aorta are within normal in caliber. There is no evidence of dissection. No evidence of hilar, mediastinal or axillary adenopathy. Remaining mediastinal structures are within normal. Images through the upper abdomen demonstrate multiple well-defined hypodensities within the liver compatible with cysts. There are minimal degenerative changes of the spine. Review of the MIP images confirms the above findings. IMPRESSION: No acute cardiopulmonary disease. Borderline dilatation of the aortic root measuring 3.9-4.2 cm. Ascending thoracic aorta, aortic arch as well as descending thoracic aorta are within normal in caliber. Recommend semi-annual imaging followup by CTA or MRA and referral to cardiothoracic surgery if not already obtained. This recommendation follows 2010 ACCF/AHA/AATS/ACR/ASA/SCA/SCAI/SIR/STS/SVM Guidelines for the Diagnosis and Management of Patients With Thoracic Aortic Disease. Circulation. 2010; 121: e266-e36. Atherosclerotic coronary artery disease. Multiple liver cysts. Electronically Signed   By: Marin Olp M.D.   On: 07/15/2015 17:26     I have independently reviewed the above radiologic studies.  Recent Lab Findings: Lab Results  Component Value Date   WBC 9.1 06/24/2015   HGB 14.4 06/24/2015   HCT 41.4 06/24/2015   PLT 191 06/24/2015   GLUCOSE 94 07/12/2015   CHOL 137 06/11/2015   TRIG 109 06/11/2015   HDL 31* 06/11/2015   LDLCALC 84 06/11/2015   ALT 25 06/24/2015   AST 74* 06/24/2015   NA 136 07/12/2015   K 4.4 07/12/2015   CL 105 07/12/2015   CREATININE 0.95 07/12/2015   BUN 19 07/12/2015   CO2 23 07/12/2015   TSH 1.572 06/10/2015   HGBA1C 5.5 01/22/2012   Echocardiogram 06/10/2015 Aortic valve:  Trileaflet. Cusp separation was normal Left ventricle: The cavity size was normal. Wall thickness  was increased in a pattern of moderate LVH. Systolic function was normal. The estimated ejection fraction was in the range of 60% to 65%. Wall motion was normal; there were no regional wall motion abnormalities. Left ventricular diastolic function parameters were normal for the patient&'s age. - Aortic valve: There was mild regurgitation. - Aorta: Aortic root dimension: 43 mm (ED). - Aortic root: The aortic root was mildly to moderately dilated. - Mitral valve: There was trivial regurgitation. - Right atrium: Central venous pressure (est): 3 mm Hg. - Tricuspid valve: There was trivial regurgitation. - Pulmonary arteries: PA peak pressure: 32 mm Hg (S). -  Pericardium, extracardiac: There was no pericardial effusion.  Myoview stress not able to be done 2/2 PVC's  Aortic Size Index=     4.3    /Body surface area is 2.22 meters squared. = 1.9  < 2.75 cm/m2      4% risk per year 2.75 to 4.25          8% risk per year > 4.25 cm/m2    20% risk per year  cross sectional area of aorta cm2/height in meters > 10 consider  surgery     Assessment / Plan:   Borderline dilatation of the aortic root measuring 3.9-4.2 cm Ascending thoracic aorta, aortic arch as well as descending thoracic aorta are within normal in caliber  Short RP tachycardia- 4 ablation in the coming several weeks  I've reviewed with the patient and his wife the radiographic and echo diagnosis of mild dilation of the aortic root, discuss implications. At this point I would not recommend any surgical intervention. Did review with the patient the signs and symptoms of aortic dissection and the need for continued follow-up. We'll plan repeat echocardiogram and CTA of the chest in one year. Need for good blood pressure control was reviewed with patient According to the 2010 ACC/AHA guidelines, we recommend patients with thoracic aortic disease to maintain a LDL of less than 70 and a HDL of greater than 50. We recommend  their blood pressure to remain less than 135/85.    I  spent 40 minutes counseling the patient face to face and 50% or more the  time was spent in counseling and coordination of care. The total time spent in the appointment was 60 minutes.  Grace Isaac MD      [address] [place] [address] Office [phone]   Beeper [phone]  08/11/2015 4:20 PM

## 2015-08-11 NOTE — Patient Instructions (Signed)
Aortic Dissection °An aortic dissection is a tear in your aorta. The aorta is the main blood vessel that carries blood out of your heart to supply the rest of your body. It comes out of your heart and curves around, then goes down through your chest (thoracic aorta) and into your belly (abdominal aorta). The wall of the aorta has inner and outer layers. °Aortic dissection occurs most often in the thoracic aorta. This is more likely to happen if the inner layer of the aorta has a weak spot or gets injured. As the dissection widens and blood flows through it, the aorta becomes "double-barreled." This means that one part of the aorta continues to carry blood. However, the inner wall begins to separate from the rest of the aorta as blood flows through the tear. The torn part of the aorta fills with blood. It swells up like a balloon. This can reduce blood flow through the part of the aorta that is still working. Aortic dissection is a medical emergency. °CAUSES °Aortic dissection happens when there is a tear in the inner wall of the aorta. An injury or weakness can cause this tear. Sometimes the exact cause of the tear is not known. °RISK FACTORS °You may be at greater risk for aortic dissection if you: °· Have certain medical conditions, such as uncontrolled high blood pressure or atherosclerosis. °· Have a blunt injury to your chest. °· Have a genetic disorder that affects the connective tissue, such as Marfan syndrome, Turner syndrome, and Ehlers-Danlos syndrome. °· Are born with a problem that affects either your aorta or your heart valve. °· Have a condition that causes inflammation of blood vessels, such as giant cell arteritis. °· Are male. °· Are older than 70 years of age. °· Use cocaine. °· Smoke. °· Lift very heavy weights or do other types of high-intensity resistance training. °SIGNS AND SYMPTOMS  °Signs and symptoms of aortic dissection may start suddenly. Changes in position may make symptoms worse. The  most common symptoms are: °· Severe chest pain that may feel like a tearing, stabbing, or sharp pain. °· Pain that shifts to the shoulder, arm, neck, jaw, abdomen, or hips. °Other symptoms may include: °· Severe abdominal pain. °· Trouble breathing. °· Dizziness or fainting. °· Nausea or vomiting. °· Trouble swallowing. °· Sweating a lot. °· Feeling confused, dazed, anxious, or fearful. °DIAGNOSIS °Your health care provider may suspect aortic dissection based on your signs and symptoms and will perform a physical exam. During the physical exam, your health care provider may listen for abnormal blood flow sounds (murmurs) in your chest or your belly. You may also have your blood pressure checked to see whether it is low or whether there is a difference between the measurements in your arms and your legs. You may also have tests such as: °· Electrocardiogram (ECG). This is a test that measures the electrical activity in your heart. °· Chest X-ray. °· CT scan or MRI. °· Aortic angiogram. This test uses the injection of a dye to make it easier to see your blood vessels clearly. °· Echocardiogram to study your heart using sound waves. °TREATMENT °It is important to treat an aortic dissection as quickly as possible. Your treatment may start as soon as your health care provider suspects aortic dissection. Treatment will depend on how severe your dissection is, where it is located, and your overall health. Treatment options include: °· Medicines to lower your blood pressure. °· Surgery to remove the dissected part of   your aorta and replace it with a graft.  Medical procedures to thread long, thin tubes (catheters) into the aorta (endovascular procedures). This may be done to place a graft or a balloon in the blood vessel to improve blood flow or prevent further dissection. HOME CARE INSTRUCTIONS  Work with your health care provider to keep your blood pressure under control.  Avoid activities that could cause an  injury to your chest or your abdomen.  Do not smoke. If you need help quitting, ask your health care provider.  Do not participate in sports or exercises that involve lifting weights.  Keep all follow-up visits as directed by your health care provider. This is important. SEEK MEDICAL CARE IF:  You develop any new symptoms of aortic dissection after treatment. SEEK IMMEDIATE MEDICAL CARE IF:  You have severe pain in your chest or your abdomen.  You have trouble breathing. These symptoms may represent a serious problem that is an emergency. Do not wait to see if the symptoms will go away. Get medical help right away. Call your local emergency services (911 in the [place] Do not drive yourself to the hospital.   This information is not intended to replace advice given to you by your health care provider. Make sure you discuss any questions you have with your health care provider.   Document Released: 10/17/2007 Document Revised: 07/31/2014 Document Reviewed: 02/18/2014 Elsevier Interactive Patient Education 2016 [place]. Thoracic Aortic Aneurysm An aneurysm is a bulge in an artery. It happens when the wall of the artery is weakened or damaged. If the aneurysm gets too big, it bursts (ruptures) and severe bleeding occurs. A thoracic aortic aneurysm is an aneurysm that occurs in the first part of the aorta, between the heart and the diaphragm. The aorta is the main artery and supplies blood from the heart to the rest of the body. A thoracic aortic aneurysm can enlarge and rupture or blood can flow between the layers of the wall of the aorta through a tear (aorticdissection). Both of these conditions can cause bleeding inside the body and can be life threatening unless diagnosed and treated promptly. CAUSES  The exact cause of a thoracic aortic aneurysm is often unknown. Some contributing factors are:   A hardening of the arteries caused by the buildup of fat and other substances in the  lining of a blood vessel (arteriosclerosis).  Inflammation of the walls of an artery (arteritis).  Connective tissue diseases, such as Marfan syndrome.  Injury or trauma to the aorta.  An infection, such as syphilis or staphylococcus, in the wall of the aorta (infectious aortitis) caused by bacteria. RISK FACTORS  Risk factors that contribute to a thoracic aortic aneurysm may include:  Age older than 47 years.  High blood pressure (hypertension).  Male gender.  Ethnicity (white race).  Obesity.  Family history of aneurysm (first degree relatives only).  Tobacco use. PREVENTION  The following healthy lifestyle habits may help decrease your risk of a thoracic aortic aneurysm:  Quitting smoking. Smoking can raise your blood pressure and cause arteriosclerosis.  Limiting or avoiding alcohol.  Keeping your blood pressure, blood sugar level, and cholesterol levels within normal limits.  Decreasing your salt intake. In some people, too much salt can raise blood pressure and increase your risk of abdominal aortic aneurysm.  Eating a diet low in saturated fats and cholesterol.  Increasing your fiber intake by including whole grains, vegetables, and fruits in your diet. Eating these foods may help lower  blood pressure.  Maintaining a healthy weight.  Staying physically active and exercising regularly. SYMPTOMS  The symptoms of thoracic aortic aneurysm may vary depending on the size and rate of growth of the aneurysm. Most grow slowly and do not have any symptoms. When symptoms do occur, they may include:  Pain (chest, back, sides, or abdomen). The pain may vary in intensity. A sudden onset of severe pain may indicate that the aneurysm has ruptured.  Hoarseness.  Cough.  Shortness of breath.  Swallowing problems.  Nausea or vomiting or both. DIAGNOSIS  Since most unruptured thoracic aortic aneurysms have no symptoms, they are often discovered during diagnostic exams for  other conditions. An aneurysm may be found during the following procedures:  Ultrasonography (a one-time screening for thoracic aortic aneurysm by ultrasonography is also recommended for all men aged 19-75 years who have ever smoked).  X-ray exams.  A CT scan.  An MRI.  Angiography or arteriography. TREATMENT  Treatment of a thoracic aortic aneurysm depends on the size of your aneurysm, your age, and risk factors for rupture. Medicine to control blood pressure and pain may be used to manage aneurysms smaller than 2.3 in (6 cm). Regular monitoring for enlargement may be recommended by your health care provider if:  The aneurysm is 1.2-1.5 in (3-4 cm) in size (an annual ultrasonography may be recommended).  The aneurysm is 1.5-1.8 in (4-4.5 cm) in size (an ultrasonography every 6 months may be recommended).  The aneurysm is larger than 1.8 in (4.5 cm) in size (your health care provider may ask that you be examined by a vascular surgeon). If your aneurysm is larger than 2.2 in (5.5 cm) or if it is enlarging quickly, surgical repair may be recommended. There are two main methods for repair of an aneurysm:   Endovascular repair (a minimally invasive surgery).  Open repair. This method is used if an endovascular repair is not possible.   This information is not intended to replace advice given to you by your health care provider. Make sure you discuss any questions you have with your health care provider.   Document Released: 07/10/2005 Document Revised: 04/30/2013 Document Reviewed: 01/20/2013 Elsevier Interactive Patient Education [employer].

## 2015-08-17 ENCOUNTER — Other Ambulatory Visit (INDEPENDENT_AMBULATORY_CARE_PROVIDER_SITE_OTHER): Payer: Medicare Other | Admitting: *Deleted

## 2015-08-17 DIAGNOSIS — I498 Other specified cardiac arrhythmias: Secondary | ICD-10-CM | POA: Diagnosis not present

## 2015-08-17 DIAGNOSIS — I77810 Thoracic aortic ectasia: Secondary | ICD-10-CM | POA: Diagnosis not present

## 2015-08-17 DIAGNOSIS — I471 Supraventricular tachycardia: Secondary | ICD-10-CM | POA: Diagnosis not present

## 2015-08-17 LAB — BASIC METABOLIC PANEL
BUN: 23 mg/dL (ref 7–25)
CHLORIDE: 107 mmol/L (ref 98–110)
CO2: 22 mmol/L (ref 20–31)
Calcium: 8.8 mg/dL (ref 8.6–10.3)
Creat: 0.89 mg/dL (ref 0.70–1.25)
GLUCOSE: 120 mg/dL — AB (ref 65–99)
POTASSIUM: 4.1 mmol/L (ref 3.5–5.3)
SODIUM: 138 mmol/L (ref 135–146)

## 2015-08-17 LAB — CBC WITH DIFFERENTIAL/PLATELET
BASOS ABS: 0 10*3/uL (ref 0.0–0.1)
Basophils Relative: 0 % (ref 0–1)
Eosinophils Absolute: 0.3 10*3/uL (ref 0.0–0.7)
Eosinophils Relative: 6 % — ABNORMAL HIGH (ref 0–5)
HEMATOCRIT: 42.7 % (ref 39.0–52.0)
HEMOGLOBIN: 14.8 g/dL (ref 13.0–17.0)
LYMPHS PCT: 22 % (ref 12–46)
Lymphs Abs: 1.3 10*3/uL (ref 0.7–4.0)
MCH: 32.2 pg (ref 26.0–34.0)
MCHC: 34.7 g/dL (ref 30.0–36.0)
MCV: 92.8 fL (ref 78.0–100.0)
MPV: 9.6 fL (ref 8.6–12.4)
Monocytes Absolute: 0.6 10*3/uL (ref 0.1–1.0)
Monocytes Relative: 11 % (ref 3–12)
NEUTROS ABS: 3.5 10*3/uL (ref 1.7–7.7)
Neutrophils Relative %: 61 % (ref 43–77)
PLATELETS: 171 10*3/uL (ref 150–400)
RBC: 4.6 MIL/uL (ref 4.22–5.81)
RDW: 13.3 % (ref 11.5–15.5)
WBC: 5.7 10*3/uL (ref 4.0–10.5)

## 2015-08-23 ENCOUNTER — Other Ambulatory Visit: Payer: Self-pay | Admitting: Physician Assistant

## 2015-08-24 ENCOUNTER — Ambulatory Visit (HOSPITAL_COMMUNITY): Payer: Medicare Other | Admitting: Certified Registered Nurse Anesthetist

## 2015-08-24 ENCOUNTER — Encounter (HOSPITAL_COMMUNITY)
Admission: RE | Disposition: A | Discharge status: Home / Self Care | Payer: Self-pay | Source: Ambulatory Visit | Attending: Internal Medicine

## 2015-08-24 ENCOUNTER — Encounter (HOSPITAL_COMMUNITY): Payer: Self-pay | Admitting: Certified Registered Nurse Anesthetist

## 2015-08-24 ENCOUNTER — Ambulatory Visit (HOSPITAL_COMMUNITY)
Admission: RE | Admit: 2015-08-24 | Discharge: 2015-08-24 | Disposition: A | Discharge status: Home / Self Care | Payer: Medicare Other | Source: Ambulatory Visit | Attending: Internal Medicine | Admitting: Internal Medicine

## 2015-08-24 DIAGNOSIS — E669 Obesity, unspecified: Secondary | ICD-10-CM | POA: Insufficient documentation

## 2015-08-24 DIAGNOSIS — Z7982 Long term (current) use of aspirin: Secondary | ICD-10-CM | POA: Insufficient documentation

## 2015-08-24 DIAGNOSIS — I471 Supraventricular tachycardia, unspecified: Secondary | ICD-10-CM | POA: Diagnosis present

## 2015-08-24 DIAGNOSIS — I10 Essential (primary) hypertension: Secondary | ICD-10-CM | POA: Diagnosis not present

## 2015-08-24 DIAGNOSIS — L409 Psoriasis, unspecified: Secondary | ICD-10-CM | POA: Diagnosis not present

## 2015-08-24 DIAGNOSIS — Z87891 Personal history of nicotine dependence: Secondary | ICD-10-CM | POA: Diagnosis not present

## 2015-08-24 DIAGNOSIS — Z6831 Body mass index (BMI) 31.0-31.9, adult: Secondary | ICD-10-CM | POA: Insufficient documentation

## 2015-08-24 DIAGNOSIS — I4892 Unspecified atrial flutter: Secondary | ICD-10-CM | POA: Diagnosis not present

## 2015-08-24 DIAGNOSIS — E119 Type 2 diabetes mellitus without complications: Secondary | ICD-10-CM | POA: Insufficient documentation

## 2015-08-24 DIAGNOSIS — Z792 Long term (current) use of antibiotics: Secondary | ICD-10-CM | POA: Diagnosis not present

## 2015-08-24 DIAGNOSIS — Z79899 Other long term (current) drug therapy: Secondary | ICD-10-CM | POA: Diagnosis not present

## 2015-08-24 HISTORY — PX: ELECTROPHYSIOLOGIC STUDY: SHX172A

## 2015-08-24 SURGERY — A-FLUTTER/A-TACH/SVT ABLATION
Anesthesia: Monitor Anesthesia Care

## 2015-08-24 MED ORDER — SODIUM CHLORIDE 0.9% FLUSH: 3.0000 mL | Freq: Two times a day (BID) | INTRAVENOUS | Status: DC

## 2015-08-24 MED ORDER — MIDAZOLAM HCL 5 MG/5ML IJ SOLN
INTRAMUSCULAR | Status: DC | PRN
  Administered 2015-08-24 (×2): 1 mg via INTRAVENOUS

## 2015-08-24 MED ORDER — SODIUM CHLORIDE 0.9% FLUSH: 3.0000 mL | INTRAVENOUS | Status: DC | PRN

## 2015-08-24 MED ORDER — SODIUM CHLORIDE 0.9 % IV SOLN: 250.0000 mL | INTRAVENOUS | Status: DC | PRN

## 2015-08-24 MED ORDER — ONDANSETRON HCL 4 MG/2ML IJ SOLN: 4.0000 mg | Freq: Four times a day (QID) | INTRAMUSCULAR | Status: DC | PRN

## 2015-08-24 MED ORDER — PROPOFOL 500 MG/50ML IV EMUL
INTRAVENOUS | Status: DC | PRN
  Administered 2015-08-24: 25 ug/kg/min via INTRAVENOUS

## 2015-08-24 MED ORDER — SODIUM CHLORIDE 0.9 % IV SOLN
2.0000 ug/min | INTRAVENOUS | Status: AC
  Administered 2015-08-24: 2 ug/min via INTRAVENOUS
  Filled 2015-08-24: qty 2

## 2015-08-24 MED ORDER — HYDROCODONE-ACETAMINOPHEN 5-325 MG PO TABS: 1.0000 | ORAL_TABLET | ORAL | Status: DC | PRN

## 2015-08-24 MED ORDER — SODIUM CHLORIDE 0.9 % IV SOLN
INTRAVENOUS | Status: DC
  Administered 2015-08-24 (×2): via INTRAVENOUS

## 2015-08-24 MED ORDER — ACETAMINOPHEN 325 MG PO TABS: 650.0000 mg | ORAL_TABLET | ORAL | Status: DC | PRN

## 2015-08-24 MED ORDER — FENTANYL CITRATE (PF) 100 MCG/2ML IJ SOLN
INTRAMUSCULAR | Status: DC | PRN
  Administered 2015-08-24 (×2): 25 ug via INTRAVENOUS
  Administered 2015-08-24: 50 ug via INTRAVENOUS

## 2015-08-24 MED ORDER — BUPIVACAINE HCL (PF) 0.25 % IJ SOLN
30.0000 mL | INTRAMUSCULAR | Status: AC
  Administered 2015-08-24: 30 mL
  Filled 2015-08-24: qty 30

## 2015-08-24 MED ORDER — PHENYLEPHRINE HCL 10 MG/ML IJ SOLN
INTRAMUSCULAR | Status: DC | PRN
  Administered 2015-08-24 (×5): 40 ug via INTRAVENOUS
  Administered 2015-08-24 (×2): 80 ug via INTRAVENOUS
  Administered 2015-08-24: 40 ug via INTRAVENOUS
  Administered 2015-08-24: 80 ug via INTRAVENOUS

## 2015-08-24 MED ORDER — PROPOFOL 10 MG/ML IV BOLUS
INTRAVENOUS | Status: DC | PRN
  Administered 2015-08-24: 20 mg via INTRAVENOUS

## 2015-08-24 SURGICAL SUPPLY — 13 items
BAG SNAP BAND KOVER 36X36 (MISCELLANEOUS) ×3 IMPLANT
BLANKET WARM UNDERBOD FULL ACC (MISCELLANEOUS) ×3 IMPLANT
CATH EZ STEER NAV 4MM D-F CUR (ABLATOR) ×2 IMPLANT
CATH JOSEPHSON QUAD-ALLRED 6FR (CATHETERS) ×4 IMPLANT
CATH WEBSTER BI DIR CS D-F CRV (CATHETERS) ×3 IMPLANT
PACK EP LATEX FREE (CUSTOM PROCEDURE TRAY) ×3
PACK EP LF (CUSTOM PROCEDURE TRAY) ×1 IMPLANT
PAD DEFIB LIFELINK (PAD) ×3 IMPLANT
PATCH CARTO3 (PAD) ×3 IMPLANT
SHEATH PINNACLE 6F 10CM (SHEATH) ×3 IMPLANT
SHEATH PINNACLE 7F 10CM (SHEATH) ×3 IMPLANT
SHEATH PINNACLE 8F 10CM (SHEATH) ×3 IMPLANT
SHIELD RADPAD SCOOP 12X17 (MISCELLANEOUS) ×3 IMPLANT

## 2015-08-24 NOTE — Progress Notes (Signed)
Site area: RFV x 3 Site Prior to Removal:  Level 0 Pressure Applied For: Manual:   35min Patient Status During Pull:  stable Post Pull Site:  Level Post Pull Instructions Given:  yes Post Pull Pulses Present: palpable Dressing Applied: clear  Bedrest begins @ 1100 till 1700 Comments:

## 2015-08-24 NOTE — Discharge Instructions (Signed)
No driving for 3. No lifting over 5 lbs for 1 week. No sexual activity for 1 week. You may return to work in 1 week. Keep procedure site clean & dry. If you notice increased pain, swelling, bleeding or pus, call/return!  You may shower, but no soaking baths/hot tubs/pools for 1 week.  ° ° °

## 2015-08-24 NOTE — H&P (Signed)
Reason for Consult: SVT  Daniel Guerrero is a 70 y.o. Male who presents today for EPS and ablation of SVT. He reports that he has had palpitations since the age of 89 that historically self terminated. About 4 years ago, he developed persistent tachypalpitations for which he went to [facility] and was given adenosine which terminated episode. He has since had 2 episodes in the last several weeks for which he was evaluated in the ER and received adenosine. He reports doing well today. He is fearful of recurrent episodes and has decreased his activity. He did have an episode of syncope with last episode of SVT. He rides his bike 2 days per week without functional limitations. He denies chest pain, dyspnea, PND, orthopnea, nausea, vomiting, dizziness, edema, weight gain, or early satiety.  Echo 06/10/15 demonstrated EF 60-65%, mildly to moderately dilated aortic root, LA 40. Myoview stress not able to be done 2/2 PVC's.   Past Medical History  Diagnosis Date  . Hypertension   . Eczema   . Psoriasis   . Hearing loss     high frequency  . Cataract of both eyes   . Erectile dysfunction   . Obesity   . S/P appendectomy   . History of inguinal hernia repair, bilateral   . S/P tonsillectomy   . Loss of hearing     high frequency  . Paroxysmal SVT (supraventricular tachycardia) ([place])   . Type II or unspecified type diabetes mellitus without mention of complication, uncontrolled     12-29-14 Told he was prediabetic once   Past Surgical History  Procedure Laterality Date  . Appendectomy    . Tonsillectomy    . Eye surgery      Catracts removed both eye 30 yrs ago  . Hernia repair      right and left  . Cystoscopy with retrograde pyelogram, ureteroscopy and stent placement Left 12/30/2014    Procedure: CYSTOSCOPY WITH LEFT URETEROSCOPY STONE EXTRACTION WITH STENT; Surgeon: Franchot Gallo, MD;  Location: [facility]; Service: Urology; Laterality: Left;  . Holmium laser application Left 123XX123    Procedure: HOLMIUM LASER APPLICATION; Surgeon: Franchot Gallo, MD; Location: [facility]; Service: Urology; Laterality: Left;    Current Outpatient Prescriptions  Medication Sig Dispense Refill  . aspirin 81 MG chewable tablet Chew 81 mg by mouth daily.    Marland Kitchen atorvastatin (LIPITOR) 20 MG tablet Take 1 tablet (20 mg total) by mouth daily at 6 PM. 30 tablet 3  . azithromycin (ZITHROMAX) 250 MG tablet Take 250 mg by mouth daily.    Marland Kitchen b complex vitamins tablet Take 1 tablet by mouth daily.    . Cyanocobalamin (VITAMIN B-12 PO) Take 1 tablet by mouth daily.    . fluticasone (FLONASE) 50 MCG/ACT nasal spray Place 2 sprays into both nostrils daily as needed for allergies or rhinitis.    Marland Kitchen loratadine (CLARITIN) 10 MG tablet Take 10 mg by mouth daily.    . metoprolol tartrate (LOPRESSOR) 25 MG tablet Take 0.5 tablets (12.5 mg total) by mouth 2 (two) times daily. 60 tablet 3  . vitamin C (ASCORBIC ACID) 500 MG tablet Take 500 mg by mouth daily.     No current facility-administered medications for this visit.   Facility-Administered Medications Ordered in Other Visits  Medication Dose Route Frequency Provider Last Rate Last Dose  . technetium sestamibi (CARDIOLITE) injection 30 milli Curie 30 milli Curie Intravenous Once PRN Lendon Colonel, NP      Allergies: Peanut-containing drug  products and Penicillins   Social History: Social History   Social History  . Marital Status: Married    Spouse Name: N/A  . Number of Children: N/A  . Years of Education: N/A   Occupational History  . Not on file.   Social History Main Topics  . Smoking status: Former Research scientist (life sciences)  . Smokeless tobacco: Never Used     Comment: Quit 20-30 years ago  . Alcohol Use: 25.2 oz/week    7  Glasses of wine, 28 Cans of beer, 7 Shots of liquor per week     Comment: occasional  . Drug Use: No  . Sexual Activity: Not on file   Other Topics Concern  . Not on file   Social History Narrative   Daily Caffeine use: 5-6 drinks daily   Exercising 2 times per week   No Drug use             Family History: Family History  Problem Relation Age of Onset  . Cancer Mother     lung  . Diabetes Father   . Thyroid disease Mother   . Lung cancer Mother   . Colon cancer Neg Hx   . GI Bleed Father               PE: Filed Vitals:   08/24/15 0552  BP: 161/102  Pulse: 52  Temp: 98 F (36.7 C)  Resp: 16   GEN- The patient is elderly and obese appearing, alert and oriented x 3 today.  HEENT: normocephalic, atraumatic; sclera clear, conjunctiva pink; hearing intact; oropharynx clear; neck supple Lungs- Clear to ausculation bilaterally, normal work of breathing. No wheezes, rales, rhonchi Heart- Regular rate and rhythm, no murmurs, rubs or gallops GI- obese, non-tender, non-distended, bowel sounds present Extremities- no clubbing, cyanosis, or edema; DP/PT/radial pulses 1+ bilaterally MS- no significant deformity or atrophy Skin- warm and dry, no rash or lesion  Psych- euthymic mood, full affect Neuro- strength and sensation are intact   Recent Labs: 06/10/2015: Magnesium 2.2; TSH 1.572 06/24/2015: ALT 25; BUN 20; Creatinine, Ser 1.43*; Hemoglobin 14.4; Platelets 191; Potassium 4.1; Sodium 139   Other studies Reviewed: Additional studies/ records that were reviewed today include: ER records, echo   Assessment and Plan: 1. Short RP tachycardia The patient has recurrent adenosine sensitive short RP tachycardia. He has been placed on Metoprolol but is now bradycardic and unable to generate HR as before.  Therapeutic strategies for supraventricular tachycardia including medicine and ablation were discussed in  detail with the patient today. Risk, benefits, and alternatives to EP study and radiofrequency ablation were also discussed in detail today. These risks include but are not limited to stroke, bleeding, vascular damage, tamponade, perforation, damage to the heart and other structures, AV block requiring pacemaker, worsening renal function, and death. The patient understands these risk and wishes to proceed.    2. Enlarged aortic root by echo Dr Everrett Coombe note is reviewed.  Thompson Grayer MD, [facility] 08/24/2015 7:35 AM

## 2015-08-24 NOTE — Anesthesia Preprocedure Evaluation (Signed)
Anesthesia Evaluation  Patient identified by MRN, date of birth, ID band Patient awake    Reviewed: Allergy & Precautions, NPO status , Patient's Chart, lab work & pertinent test results  History of Anesthesia Complications Negative for: history of anesthetic complications  Airway Mallampati: II  TM Distance: >3 FB Neck ROM: Full    Dental  (+) Teeth Intact   Pulmonary neg shortness of breath, neg sleep apnea, neg COPD, neg recent URI, former smoker,    breath sounds clear to auscultation       Cardiovascular hypertension, Pt. on medications and Pt. on home beta blockers  Rhythm:Regular     Neuro/Psych  Headaches, negative psych ROS   GI/Hepatic negative GI ROS, Neg liver ROS,   Endo/Other  diabetes  Renal/GU      Musculoskeletal   Abdominal   Peds  Hematology   Anesthesia Other Findings   Reproductive/Obstetrics                             Anesthesia Physical Anesthesia Plan  ASA: III  Anesthesia Plan: MAC   Post-op Pain Management:    Induction: Intravenous  Airway Management Planned: Simple Face Mask, Natural Airway and Nasal Cannula  Additional Equipment: None  Intra-op Plan:   Post-operative Plan:   Informed Consent: I have reviewed the patients History and Physical, chart, labs and discussed the procedure including the risks, benefits and alternatives for the proposed anesthesia with the patient or authorized representative who has indicated his/her understanding and acceptance.   Dental advisory given  Plan Discussed with: CRNA and Surgeon  Anesthesia Plan Comments:         Anesthesia Quick Evaluation

## 2015-08-24 NOTE — Progress Notes (Signed)
Doing well s/p ablation No arrhythmias on telemetry VSS, exam stable  DC to home Routine groin care Stop metoprolol Follow-up with me in 4 weeks  Thompson Grayer MD, [facility] 08/24/2015 4:09 PM

## 2015-08-24 NOTE — Transfer of Care (Signed)
Immediate Anesthesia Transfer of Care Note  Patient: Daniel Guerrero  Procedure(s) Performed: Procedure(s): SVT Ablation (N/A)  Patient Location: Cath Lab  Anesthesia Type:MAC  Level of Consciousness: awake, alert , oriented and patient cooperative  Airway & Oxygen Therapy: Patient Spontanous Breathing and Patient connected to face mask oxygen  Post-op Assessment: Report given to RN, Post -op Vital signs reviewed and stable, Patient moving all extremities and Patient moving all extremities X 4  Post vital signs: Reviewed and stable  Last Vitals:  Filed Vitals:   08/24/15 0552  BP: 161/102  Pulse: 52  Temp: 36.7 C  Resp: 16    Complications: No apparent anesthesia complications

## 2015-08-25 ENCOUNTER — Telehealth: Payer: Self-pay | Admitting: Internal Medicine

## 2015-08-25 ENCOUNTER — Encounter: Payer: Medicare Other | Admitting: Family Medicine

## 2015-08-25 LAB — GLUCOSE, CAPILLARY: Glucose-Capillary: 103 mg/dL — ABNORMAL HIGH (ref 65–99)

## 2015-08-25 NOTE — Telephone Encounter (Signed)
New message     Patient calling had procedure on yesterday C/O rash this am - wants to know can he take a benadryl .

## 2015-08-25 NOTE — Telephone Encounter (Signed)
Spoke with patient and let him know to apply hydrocortisone cream to the area and if needed may take Benadryl if needed.  He verbalized understanding

## 2015-08-25 NOTE — Anesthesia Postprocedure Evaluation (Signed)
Anesthesia Post Note  Patient: Daniel Guerrero  Procedure(s) Performed: Procedure(s) (LRB): SVT Ablation (N/A)  Patient location during evaluation: PACU Anesthesia Type: MAC Level of consciousness: awake Pain management: pain level controlled Vital Signs Assessment: post-procedure vital signs reviewed and stable Respiratory status: spontaneous breathing Cardiovascular status: stable Postop Assessment: no signs of nausea or vomiting Anesthetic complications: no    Last Vitals:  Filed Vitals:   08/24/15 1500 08/24/15 1600  BP: 165/101 156/91  Pulse: 55 56  Temp:    Resp: 15 18    Last Pain: There were no vitals filed for this visit.               Caylen Kuwahara

## 2015-08-25 NOTE — Progress Notes (Addendum)
08/25/15 1232  Called pt at home to remind him to keep an eye on his blood pressure since we had stopped metoprolol due to the ablation. Explained to pt the medication kept his blood pressure down as well as his heart rate. Dr. Servando Snare had recommended keeping blood pressure to remain less than 135/85 at office visit on 08/11/15. Pt stated understanding. Carroll Kinds RN

## 2015-09-02 ENCOUNTER — Telehealth: Payer: Self-pay | Admitting: Internal Medicine

## 2015-09-02 NOTE — Telephone Encounter (Signed)
New message     Pt had an ablation on 08-24-15.  There is a 6 inch area below the incision that is bruised and tender.  Is this normal?

## 2015-09-02 NOTE — Telephone Encounter (Signed)
Spoke with patient and let him know it was normal to have this area.  But if it starts to harden and swell and not dissipate give me a call back.

## 2015-09-03 ENCOUNTER — Encounter: Payer: Self-pay | Admitting: Internal Medicine

## 2015-09-14 ENCOUNTER — Ambulatory Visit (INDEPENDENT_AMBULATORY_CARE_PROVIDER_SITE_OTHER): Payer: Medicare Other | Admitting: Family Medicine

## 2015-09-14 ENCOUNTER — Encounter: Payer: Self-pay | Admitting: Family Medicine

## 2015-09-14 VITALS — BP 140/80 | HR 72 | Temp 98.0°F | Ht 70.0 in | Wt 226.3 lb

## 2015-09-14 DIAGNOSIS — H9312 Tinnitus, left ear: Secondary | ICD-10-CM | POA: Diagnosis not present

## 2015-09-14 DIAGNOSIS — H906 Mixed conductive and sensorineural hearing loss, bilateral: Secondary | ICD-10-CM | POA: Diagnosis not present

## 2015-09-14 DIAGNOSIS — R42 Dizziness and giddiness: Secondary | ICD-10-CM | POA: Diagnosis not present

## 2015-09-14 MED ORDER — MECLIZINE HCL 25 MG PO TABS: 25.0000 mg | ORAL_TABLET | Freq: Three times a day (TID) | ORAL | Status: DC | PRN

## 2015-09-14 NOTE — Progress Notes (Signed)
HPI:  Daniel Guerrero  is a very pleasant 70 year old male patient of Dr. Sherren Mocha here for an acute visit for a refill of his meclizine. He reports an extensive evaluation for vertigo with a diagnosis of Mnire's disease in 2015 at [facility]. He reports intermittent symptoms that include ringing in the left ear and vertigo since and occasionally uses meclizine for this.  He wears hearing aids. He reports that these symptoms are unchanged and are not worsening. He denies chest pain , palpitations, shortness of breath, dyspnea on exertion , swelling , fevers or headaches. He reports he sees cardiology for his heart issues and has a follow-up next week , but that he has been doing great status post an ablation for his SVT. She reports he is considering seeing an ear nose and throat doctor in [place] and certainly plans to do this if symptoms ever worsen or change.   ROS: See pertinent positives and negatives per HPI.  Past Medical History  Diagnosis Date  . Hypertension   . Eczema   . Psoriasis   . Hearing loss     high frequency  . Cataract of both eyes   . Erectile dysfunction   . Obesity   . S/P appendectomy   . History of inguinal hernia repair, bilateral   . S/P tonsillectomy   . Loss of hearing     high frequency  . Paroxysmal SVT (supraventricular tachycardia) ([place])   . Type II or unspecified type diabetes mellitus without mention of complication, uncontrolled     12-29-14 Told he was prediabetic once    Past Surgical History  Procedure Laterality Date  . Appendectomy    . Tonsillectomy    . Eye surgery      Catracts removed both eye 30 yrs ago  . Hernia repair      right and left  . Cystoscopy with retrograde pyelogram, ureteroscopy and stent placement Left 12/30/2014    Procedure: CYSTOSCOPY WITH LEFT URETEROSCOPY STONE EXTRACTION WITH STENT;  Surgeon: Franchot Gallo, MD;  Location: [facility];  Service: Urology;  Laterality: Left;  . Holmium laser application Left 123XX123     Procedure: HOLMIUM LASER APPLICATION;  Surgeon: Franchot Gallo, MD;  Location: [facility];  Service: Urology;  Laterality: Left;  . Electrophysiologic study N/A 08/24/2015    Procedure: SVT Ablation;  Surgeon: Thompson Grayer, MD;  Location: [place] [facility];  Service: Cardiovascular;  Laterality: N/A;    Family History  Problem Relation Age of Onset  . Cancer Mother     lung  . Diabetes Father   . Thyroid disease Mother   . Lung cancer Mother   . Colon cancer Neg Hx   . GI Bleed Father     Social History   Social History  . Marital Status: Married    Spouse Name: N/A  . Number of Children: N/A  . Years of Education: N/A   Social History Main Topics  . Smoking status: Former Research scientist (life sciences)  . Smokeless tobacco: Never Used     Comment: Quit 20-30 years ago  . Alcohol Use: 25.2 oz/week    7 Glasses of wine, 28 Cans of beer, 7 Shots of liquor per week     Comment: occasional  . Drug Use: No  . Sexual Activity: Not Asked   Other Topics Concern  . None   Social History Narrative   Daily Caffeine use: 5-6 drinks daily   Exercising 2 times per week   No Drug use  Current outpatient prescriptions:  .  aspirin 81 MG chewable tablet, Chew 81 mg by mouth daily., Disp: , Rfl:  .  atorvastatin (LIPITOR) 20 MG tablet, Take 1 tablet (20 mg total) by mouth daily at 6 PM., Disp: 30 tablet, Rfl: 3 .  b complex vitamins tablet, Take 1 tablet by mouth daily., Disp: , Rfl:  .  clobetasol cream (TEMOVATE) AB-123456789 %, Apply 1 application topically as needed., Disp: , Rfl:  .  Cyanocobalamin (VITAMIN B-12 PO), Take 1 tablet by mouth daily., Disp: , Rfl:  .  fluticasone (FLONASE) 50 MCG/ACT nasal spray, Place 2 sprays into both nostrils daily as needed for allergies or rhinitis., Disp: , Rfl:  .  loratadine (CLARITIN) 10 MG tablet, Take 10 mg by mouth daily., Disp: , Rfl:  .  vitamin C (ASCORBIC ACID) 500 MG tablet, Take 500 mg by mouth daily., Disp: , Rfl:  .  meclizine (ANTIVERT) 25  MG tablet, Take 1 tablet (25 mg total) by mouth 3 (three) times daily as needed for dizziness., Disp: 30 tablet, Rfl: 0  EXAM:  Filed Vitals:   09/14/15 0920  BP: 140/80  Pulse: 72  Temp: 98 F (36.7 C)    Body mass index is 32.47 kg/(m^2).  GENERAL: vitals reviewed and listed above, alert, oriented, appears well hydrated and in no acute distress  HEENT: atraumatic, conjunttiva clear, no obvious abnormalities on inspection of external nose and ears,  Wears hearing aids, normal inspection of the ear canals and TMs, minimal clear nasal congestion   NECK: no obvious masses on inspection,  No carotid bruits  LUNGS: clear to auscultation bilaterally, no wheezes, rales or rhonchi, good air movement  CV: HRRR, no peripheral edema  MS: moves all extremities without noticeable abnormality  PSYCH/NEURO: pleasant and cooperative, no obvious depression or anxiety, gait is normal, cranial nerves II through XII grossly intact, finger to nose test normal , speech and thought processing grossly intact  ASSESSMENT AND PLAN:  Discussed the following assessment and plan:  MIXED HEARING LOSS BILATERAL  Tinnitus, left  Vertigo  - Refilled meclizine after discussion of risks per his request and discussed etiologies of vertigo   advised follow-up with his cardiologist as planned , seems to be doing well from a cardiac standpoint at this time   advised evaluation with ear nose and throat if symptoms worsen or persist and routine follow-up with his primary provider in one month -Patient advised to return or notify a doctor immediately if symptoms worsen or persist or new concerns arise.  Patient Instructions   Before you leave:   - schedule follow up with your primary provider in 1 month    he sent a refill 4 your medication to your pharmacy   please see you cardiologist as scheduled     Carmel Garfield, Jarrett Soho R.

## 2015-09-14 NOTE — Patient Instructions (Signed)
Before you leave:   - schedule follow up with your primary provider in 1 month    he sent a refill 4 your medication to your pharmacy   please see you cardiologist as scheduled

## 2015-09-14 NOTE — Progress Notes (Signed)
Pre visit review using our clinic review tool, if applicable. No additional management support is needed unless otherwise documented below in the visit note. 

## 2015-09-17 ENCOUNTER — Telehealth: Payer: Self-pay | Admitting: Family Medicine

## 2015-09-17 DIAGNOSIS — H9319 Tinnitus, unspecified ear: Secondary | ICD-10-CM

## 2015-09-17 NOTE — Telephone Encounter (Signed)
Referral placed.

## 2015-09-17 NOTE — Telephone Encounter (Signed)
Patient wife stated nurse said he need a referral in order to get a referral to the ENT Doctor.please advise

## 2015-09-21 ENCOUNTER — Ambulatory Visit (INDEPENDENT_AMBULATORY_CARE_PROVIDER_SITE_OTHER): Payer: Medicare Other | Admitting: Internal Medicine

## 2015-09-21 ENCOUNTER — Encounter: Payer: Self-pay | Admitting: Internal Medicine

## 2015-09-21 VITALS — BP 122/68 | HR 64 | Ht 70.0 in | Wt 223.0 lb

## 2015-09-21 DIAGNOSIS — I471 Supraventricular tachycardia: Secondary | ICD-10-CM | POA: Diagnosis not present

## 2015-09-21 NOTE — Progress Notes (Signed)
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 male who presents today for routine electrophysiology followup.  Since his recent SVT ablation, the patient reports doing very well.  Denies procedure related complications.  No further SVT.  Today, he denies symptoms of palpitations, chest pain, shortness of breath,  lower extremity edema, dizziness, presyncope, or syncope.  The patient is otherwise without complaint today.   Past Medical History  Diagnosis Date  . Hypertension   . Eczema   . Psoriasis   . Hearing loss     high frequency  . Cataract of both eyes   . Erectile dysfunction   . Obesity   . S/P appendectomy   . History of inguinal hernia repair, bilateral   . S/P tonsillectomy   . Loss of hearing     high frequency  . Paroxysmal SVT (supraventricular tachycardia) ([place])   . Type II or unspecified type diabetes mellitus without mention of complication, uncontrolled     12-29-14 Told he was prediabetic once   Past Surgical History  Procedure Laterality Date  . Appendectomy    . Tonsillectomy    . Eye surgery      Catracts removed both eye 30 yrs ago  . Hernia repair      right and left  . Cystoscopy with retrograde pyelogram, ureteroscopy and stent placement Left 12/30/2014    Procedure: CYSTOSCOPY WITH LEFT URETEROSCOPY STONE EXTRACTION WITH STENT;  Surgeon: Franchot Gallo, MD;  [facility]: [facility];  Service: Urology;  Laterality: Left;  . Holmium laser application Left 123XX123    Procedure: HOLMIUM LASER APPLICATION;  Surgeon: Franchot Gallo, MD;  [facility];  Service: Urology;  Laterality: Left;  . Electrophysiologic study N/A 08/24/2015    Procedure: SVT Ablation;  Surgeon: Thompson Grayer, MD;  Location: [place] [facility];  Service: Cardiovascular;  Laterality: N/A;    ROS- all systems are reviewed and negatives except as per HPI above  Current Outpatient Prescriptions  Medication Sig Dispense Refill  . aspirin  81 MG chewable tablet Chew 81 mg by mouth daily.    Marland Kitchen b complex vitamins tablet Take 1 tablet by mouth daily.    . clobetasol cream (TEMOVATE) AB-123456789 % Apply 1 application topically as needed.    . Cyanocobalamin (VITAMIN B-12 PO) Take 1 tablet by mouth daily.    . fexofenadine (ALLEGRA) 180 MG tablet Take 180 mg by mouth daily.    . fluticasone (FLONASE) 50 MCG/ACT nasal spray Place 2 sprays into both nostrils daily as needed for allergies or rhinitis.    Marland Kitchen meclizine (ANTIVERT) 25 MG tablet Take 1 tablet (25 mg total) by mouth 3 (three) times daily as needed for dizziness. 30 tablet 0  . vitamin C (ASCORBIC ACID) 500 MG tablet Take 500 mg by mouth daily.    Marland Kitchen atorvastatin (LIPITOR) 20 MG tablet Take 1 tablet (20 mg total) by mouth daily at 6 PM. (Patient not taking: Reported on 09/21/2015) 30 tablet 3   No current facility-administered medications for this visit.    Physical Exam: Filed Vitals:   09/21/15 1550  BP: 122/68  Pulse: 64  Height: 5\' 10"  (1.778 m)  Weight: 223 lb (101.152 kg)    GEN- The patient is well appearing, alert and oriented x 3 today.   Head- normocephalic, atraumatic Eyes-  Sclera clear, conjunctiva pink Ears- hearing intact Oropharynx- clear Lungs- Clear to ausculation bilaterally, normal work of breathing Heart- Regular rate and  rhythm, no murmurs, rubs or gallops, PMI not laterally displaced GI- soft, NT, ND, + BS Extremities- no clubbing, cyanosis, or edema  ekg today reveals sinus rhythm, PR 220 msec, LVH  Assessment and Plan:  1. SVT Resolved s/p ablation  2. Aortic root enlargement Follow-up with Dr Servando Snare in 1 year  I will see as needed going forward  Thompson Grayer MD, [facility] 09/21/2015 4:21 PM

## 2015-09-21 NOTE — Patient Instructions (Signed)
Medication Instructions:  Your physician recommends that you continue on your current medications as directed. Please refer to the Current Medication list given to you today.   Labwork: None ordered.  Testing/Procedures: None ordered.  Follow-Up: Your physician recommends that you schedule a follow-up appointment as needed with Dr Allred.   Any Other Special Instructions Will Be Listed Below (If Applicable).     If you need a refill on your cardiac medications before your next appointment, please call your pharmacy.   

## 2015-09-22 DIAGNOSIS — H8103 Meniere's disease, bilateral: Secondary | ICD-10-CM | POA: Diagnosis not present

## 2015-09-24 ENCOUNTER — Encounter: Payer: Medicare Other | Admitting: Internal Medicine

## 2015-09-27 ENCOUNTER — Ambulatory Visit: Payer: Medicare Other | Admitting: Internal Medicine

## 2015-10-01 NOTE — Addendum Note (Signed)
Addended by: Paulla Dolly on: 10/01/2015 05:38 PM   Modules accepted: Orders

## 2015-10-11 ENCOUNTER — Encounter: Payer: Medicare Other | Admitting: Internal Medicine

## 2015-10-19 ENCOUNTER — Ambulatory Visit (INDEPENDENT_AMBULATORY_CARE_PROVIDER_SITE_OTHER): Payer: Medicare Other | Admitting: Family Medicine

## 2015-10-19 ENCOUNTER — Encounter: Payer: Self-pay | Admitting: Family Medicine

## 2015-10-19 VITALS — Temp 98.5°F | Wt 215.0 lb

## 2015-10-19 DIAGNOSIS — H906 Mixed conductive and sensorineural hearing loss, bilateral: Secondary | ICD-10-CM

## 2015-10-19 DIAGNOSIS — H8110 Benign paroxysmal vertigo, unspecified ear: Secondary | ICD-10-CM

## 2015-10-19 NOTE — Progress Notes (Signed)
Pre visit review using our clinic review tool, if applicable. No additional management support is needed unless otherwise documented below in the visit note. 

## 2015-10-19 NOTE — Patient Instructions (Signed)
I would follow the advice of Dr. Lucia Gaskins concerning her vertigo. If your symptoms get worse then I would reconsult with him ASAP  [name] or Almyra Free are 2 new adult nurse practitioner's or Dr. [place]........... call the end of April and get set up to see one of our new 3 folks June or July for general physical exam and to get established for long-term care

## 2015-10-19 NOTE — Progress Notes (Signed)
   Subjective:    Patient ID: Daniel Guerrero, male    DOB: 1945-11-12, 70 y.o.   MRN: [id number]  HPI Daniel Guerrero is a 70 year old married male nonsmoker who comes in today for follow-up of vertigo  He was seen here in the knee with subsequent seen by his ENT Dr. Lucia Gaskins. He currently takes meclizine 25 mg 3 times daily and Maxide 30 7. 5-25 daily. He takes Valium when necessary when he has a severe episode. He feels this is a persistent problem now for the last month. He has some hearing loss in his left ear but that seems to be improving  He's accompanied by his wife today. She's frustrated that there is no cure for his problem. I explained to them both there is no cure for vertigo only time   Review of Systems    review of systems negative Objective:   Physical Exam  Well-developed well-nourished male no acute distress vital signs stable he is afebrile      Assessment & Plan:  Benign positional vertigo........ concur with therapy for with Dr. Lucia Gaskins...Marland KitchenMarland KitchenMarland Kitchen follow-up with Dr. Lucia Gaskins when necessary

## 2016-01-14 ENCOUNTER — Ambulatory Visit (INDEPENDENT_AMBULATORY_CARE_PROVIDER_SITE_OTHER): Payer: Medicare Other | Admitting: Adult Health

## 2016-01-14 ENCOUNTER — Encounter: Payer: Self-pay | Admitting: Adult Health

## 2016-01-14 VITALS — BP 128/70 | HR 68 | Wt 213.0 lb

## 2016-01-14 DIAGNOSIS — Z1211 Encounter for screening for malignant neoplasm of colon: Secondary | ICD-10-CM

## 2016-01-14 DIAGNOSIS — E669 Obesity, unspecified: Secondary | ICD-10-CM

## 2016-01-14 DIAGNOSIS — Z7689 Persons encountering health services in other specified circumstances: Secondary | ICD-10-CM

## 2016-01-14 DIAGNOSIS — Z7189 Other specified counseling: Secondary | ICD-10-CM

## 2016-01-14 MED ORDER — TRIAMCINOLONE ACETONIDE 0.5 % EX OINT: 1.0000 "application " | TOPICAL_OINTMENT | Freq: Two times a day (BID) | CUTANEOUS | Status: DC

## 2016-01-14 NOTE — Patient Instructions (Signed)
It was great seeing you again!  Please make an appointment for your physical.   I have sent in Kenalog cream for your rash   Someone will call you to schedule your colonoscopy

## 2016-01-14 NOTE — Progress Notes (Signed)
Patient presents to clinic today to establish care. He is a pleasant 70 year old caucasian male who  has a past medical history of Hypertension; Eczema; Psoriasis; Hearing loss; Cataract of both eyes; Erectile dysfunction; Obesity; S/P appendectomy; History of inguinal hernia repair, bilateral; S/P tonsillectomy; Loss of hearing; Paroxysmal SVT (supraventricular tachycardia) ([place]); Type II or unspecified type diabetes mellitus without mention of complication, uncontrolled; Meniere disease; Kidney stones; and Seasonal allergies.   His last physical was 1.5-2 years ago.   Acute Concerns: Establish Care   Chronic Issues: SVT - Since his ablation he has not had any issues with SVT. He feels as though he is doing well.   Obesity  - His weight has been stable over the last few months. He is working on losing weight.   Hearing Loss - Wears bilateral hearing aids and will follow up with ENT for hearing exam   Health Maintenance: Dental --Twice a year  Vision --Is due  Immunizations -- UTD Colonoscopy -- Greater than 10 years Diet: Is trying to eat healthy Exercise: Works in the yard multiple times per week.   Is followed by  Cardiology - As needed s/p ablation.  ENT - September for follow up regarding Meniere's disease     Past Medical History  Diagnosis Date  . Hypertension   . Eczema   . Psoriasis   . Hearing loss     high frequency  . Cataract of both eyes   . Erectile dysfunction   . Obesity   . S/P appendectomy   . History of inguinal hernia repair, bilateral   . S/P tonsillectomy   . Loss of hearing     high frequency  . Paroxysmal SVT (supraventricular tachycardia) ([place])   . Type II or unspecified type diabetes mellitus without mention of complication, uncontrolled     12-29-14 Told he was prediabetic once  . Meniere disease   . Kidney stones   . Seasonal allergies     Past Surgical History  Procedure Laterality Date  . Appendectomy    . Tonsillectomy      . Eye surgery      Catracts removed both eye 30 yrs ago  . Hernia repair      right and left  . Cystoscopy with retrograde pyelogram, ureteroscopy and stent placement Left 12/30/2014    Procedure: CYSTOSCOPY WITH LEFT URETEROSCOPY STONE EXTRACTION WITH STENT;  Surgeon: Franchot Gallo, MD;  Location: [facility];  Service: Urology;  Laterality: Left;  . Holmium laser application Left [id number]    Procedure: HOLMIUM LASER APPLICATION;  Surgeon: Franchot Gallo, MD;  Location: [facility];  Service: Urology;  Laterality: Left;  . Electrophysiologic study N/A 08/24/2015    Procedure: SVT Ablation;  Surgeon: Thompson Grayer, MD;  Location: [place];  Service: Cardiovascular;  Laterality: N/A;  . Ablasion      Current Outpatient Prescriptions on File Prior to Visit  Medication Sig Dispense Refill  . aspirin 81 MG chewable tablet Chew 81 mg by mouth daily.    Marland Kitchen atorvastatin (LIPITOR) 20 MG tablet Take 1 tablet (20 mg total) by mouth daily at 6 PM. 30 tablet 3  . b complex vitamins tablet Take 1 tablet by mouth daily.    . clobetasol cream (TEMOVATE) AB-123456789 % Apply 1 application topically as needed.    . Cyanocobalamin (VITAMIN B-12 PO) Take 1 tablet by mouth daily.    . diazepam (VALIUM) 5 MG tablet Take 5 mg by mouth every  8 (eight) hours as needed.  0  . fexofenadine (ALLEGRA) 180 MG tablet Take 180 mg by mouth daily.    . fluticasone (FLONASE) 50 MCG/ACT nasal spray Place 2 sprays into both nostrils daily as needed for allergies or rhinitis.    Marland Kitchen meclizine (ANTIVERT) 25 MG tablet Take 1 tablet (25 mg total) by mouth 3 (three) times daily as needed for dizziness. 30 tablet 0  . triamterene-hydrochlorothiazide (DYAZIDE) 37.5-25 MG capsule Take 1 capsule by mouth every morning.  5  . vitamin C (ASCORBIC ACID) 500 MG tablet Take 500 mg by mouth daily.     No current facility-administered medications on file prior to visit.    Allergies  Allergen Reactions  . Peanut-Containing Drug Products  Hives  . Penicillins Hives    Has patient had a PCN reaction causing immediate rash, facial/tongue/throat swelling, SOB or lightheadedness with hypotension: No Has patient had a PCN reaction causing severe rash involving mucus membranes or skin necrosis: No Has patient had a PCN reaction that required hospitalization No Has patient had a PCN reaction occurring within the last 10 years: No If all of the above answers are "NO", then may proceed with Cephalosporin use.    Family History  Problem Relation Age of Onset  . Cancer Mother     lung  . Diabetes Father   . Thyroid disease Mother   . Colon cancer Neg Hx   . GI Bleed Father   . Aneurysm Father     Social History   Social History  . Marital Status: Married    Spouse Name: N/A  . Number of Children: N/A  . Years of Education: N/A   Occupational History  . Not on file.   Social History Main Topics  . Smoking status: Former Research scientist (life sciences)  . Smokeless tobacco: Never Used     Comment: Quit 20-30 years ago  . Alcohol Use: 25.2 oz/week    7 Glasses of wine, 28 Cans of beer, 7 Shots of liquor per week     Comment: occasional  . Drug Use: No  . Sexual Activity: Not on file   Other Topics Concern  . Not on file   Social History Narrative   Daily Caffeine use: 5-6 drinks daily   Exercising 2 times per week   No Drug use             Review of Systems  Constitutional: Negative.   Respiratory: Negative.   Cardiovascular: Negative.   Gastrointestinal: Negative.   Genitourinary: Negative.   Musculoskeletal: Negative.   Neurological: Negative.   All other systems reviewed and are negative.   BP 128/70 mmHg  Pulse 68  Wt 213 lb (96.616 kg)  SpO2 97%  Physical Exam  Constitutional: He is oriented to person, place, and time and well-developed, well-nourished, and in no distress. No distress.  Obesity   HENT:  Head: Normocephalic and atraumatic.  Right Ear: External ear normal.  Left Ear: External ear normal.  Nose:  Nose normal.  Mouth/Throat: Oropharynx is clear and moist. No oropharyngeal exudate.  Cardiovascular: Normal rate, regular rhythm, normal heart sounds and intact distal pulses.  Exam reveals no gallop.   No murmur heard. Pulmonary/Chest: Effort normal and breath sounds normal. No respiratory distress. He has no wheezes. He has no rales. He exhibits no tenderness.  Neurological: He is alert and oriented to person, place, and time. Gait normal. GCS score is 15.  Skin: Skin is warm and dry. No rash noted. He  is not diaphoretic. No erythema. No pallor.  Psychiatric: Mood, memory, affect and judgment normal.  Nursing note and vitals reviewed.   Assessment/Plan:  1. Encounter to establish care - Follow up in September for CPE - Follow up sooner if needed - Work on diet and increasing exercise.   2. Obesity - I would like him to start doing more aerobic exercise and eating a low calorie diet.  - Consider Belviq during next office visit.   3. Colon cancer screening - Ambulatory referral to Gastroenterology  Dorothyann Peng, NP

## 2016-02-14 ENCOUNTER — Encounter: Payer: Self-pay | Admitting: Adult Health

## 2016-02-28 ENCOUNTER — Ambulatory Visit: Payer: Medicare Other | Admitting: Family Medicine

## 2016-03-28 DIAGNOSIS — H8103 Meniere's disease, bilateral: Secondary | ICD-10-CM | POA: Diagnosis not present

## 2016-04-14 ENCOUNTER — Ambulatory Visit (INDEPENDENT_AMBULATORY_CARE_PROVIDER_SITE_OTHER): Payer: Medicare Other | Admitting: Adult Health

## 2016-04-14 ENCOUNTER — Encounter: Payer: Self-pay | Admitting: Internal Medicine

## 2016-04-14 ENCOUNTER — Encounter: Payer: Self-pay | Admitting: Adult Health

## 2016-04-14 VITALS — BP 120/76 | Temp 98.2°F | Ht 70.0 in | Wt 218.3 lb

## 2016-04-14 DIAGNOSIS — Z23 Encounter for immunization: Secondary | ICD-10-CM | POA: Diagnosis not present

## 2016-04-14 DIAGNOSIS — E785 Hyperlipidemia, unspecified: Secondary | ICD-10-CM | POA: Insufficient documentation

## 2016-04-14 DIAGNOSIS — I10 Essential (primary) hypertension: Secondary | ICD-10-CM | POA: Diagnosis not present

## 2016-04-14 DIAGNOSIS — R739 Hyperglycemia, unspecified: Secondary | ICD-10-CM | POA: Diagnosis not present

## 2016-04-14 DIAGNOSIS — E663 Overweight: Secondary | ICD-10-CM

## 2016-04-14 DIAGNOSIS — Z760 Encounter for issue of repeat prescription: Secondary | ICD-10-CM

## 2016-04-14 DIAGNOSIS — Z1211 Encounter for screening for malignant neoplasm of colon: Secondary | ICD-10-CM

## 2016-04-14 HISTORY — DX: Hyperlipidemia, unspecified: E78.5

## 2016-04-14 LAB — POC URINALSYSI DIPSTICK (AUTOMATED)
BILIRUBIN UA: NEGATIVE
GLUCOSE UA: NEGATIVE
Ketones, UA: NEGATIVE
LEUKOCYTES UA: NEGATIVE
NITRITE UA: NEGATIVE
PH UA: 5
Protein, UA: NEGATIVE
RBC UA: NEGATIVE
Spec Grav, UA: 1.02
UROBILINOGEN UA: 0.2

## 2016-04-14 LAB — HEPATIC FUNCTION PANEL
ALBUMIN: 4 g/dL (ref 3.5–5.2)
ALK PHOS: 65 U/L (ref 39–117)
ALT: 20 U/L (ref 0–53)
AST: 49 U/L — AB (ref 0–37)
Bilirubin, Direct: 0.3 mg/dL (ref 0.0–0.3)
TOTAL PROTEIN: 6.6 g/dL (ref 6.0–8.3)
Total Bilirubin: 1.4 mg/dL — ABNORMAL HIGH (ref 0.2–1.2)

## 2016-04-14 LAB — BASIC METABOLIC PANEL
BUN: 21 mg/dL (ref 6–23)
CHLORIDE: 102 meq/L (ref 96–112)
CO2: 30 mEq/L (ref 19–32)
Calcium: 8.8 mg/dL (ref 8.4–10.5)
Creatinine, Ser: 1 mg/dL (ref 0.40–1.50)
GFR: 78.44 mL/min (ref >60.00)
Glucose, Bld: 107 mg/dL — ABNORMAL HIGH (ref 70–99)
POTASSIUM: 4 meq/L (ref 3.5–5.1)
SODIUM: 139 meq/L (ref 135–145)

## 2016-04-14 LAB — LIPID PANEL
CHOLESTEROL: 151 mg/dL (ref 0–200)
HDL: 34.5 mg/dL — ABNORMAL LOW (ref >39.00)
LDL Cholesterol: 95 mg/dL (ref 0–99)
NONHDL: 116.35
Total CHOL/HDL Ratio: 4
Triglycerides: 109 mg/dL (ref 0.0–149.0)
VLDL: 21.8 mg/dL (ref 0.0–40.0)

## 2016-04-14 LAB — CBC WITH DIFFERENTIAL/PLATELET
BASOS PCT: 0.4 % (ref 0.0–3.0)
Basophils Absolute: 0 10*3/uL (ref 0.0–0.1)
EOS PCT: 5.5 % — AB (ref 0.0–5.0)
Eosinophils Absolute: 0.4 10*3/uL (ref 0.0–0.7)
HEMATOCRIT: 45.1 % (ref 39.0–52.0)
HEMOGLOBIN: 15.9 g/dL (ref 13.0–17.0)
Lymphocytes Relative: 18.3 % (ref 12.0–46.0)
Lymphs Abs: 1.3 10*3/uL (ref 0.7–4.0)
MCHC: 35.1 g/dL (ref 30.0–36.0)
MCV: 92.7 fl (ref 78.0–100.0)
MONOS PCT: 12.5 % — AB (ref 3.0–12.0)
Monocytes Absolute: 0.9 10*3/uL (ref 0.1–1.0)
Neutro Abs: 4.5 10*3/uL (ref 1.4–7.7)
Neutrophils Relative %: 63.3 % (ref 43.0–77.0)
Platelets: 213 10*3/uL (ref 150.0–400.0)
RBC: 4.87 Mil/uL (ref 4.22–5.81)
RDW: 13 % (ref 11.5–15.5)
WBC: 7.1 10*3/uL (ref 4.0–10.5)

## 2016-04-14 LAB — HEMOGLOBIN A1C: HEMOGLOBIN A1C: 5.5 % (ref 4.6–6.5)

## 2016-04-14 LAB — TSH: TSH: 1.58 u[IU]/mL (ref 0.35–4.50)

## 2016-04-14 MED ORDER — CLOBETASOL PROPIONATE 0.05 % EX CREA: 1.0000 "application " | TOPICAL_CREAM | CUTANEOUS | 11 refills | Status: DC | PRN

## 2016-04-14 MED ORDER — TRIAMCINOLONE ACETONIDE 0.5 % EX OINT: 1.0000 "application " | TOPICAL_OINTMENT | Freq: Two times a day (BID) | CUTANEOUS | 0 refills | Status: DC

## 2016-04-14 MED ORDER — DIAZEPAM 5 MG PO TABS: 5.0000 mg | ORAL_TABLET | Freq: Three times a day (TID) | ORAL | 0 refills | Status: DC | PRN

## 2016-04-14 NOTE — Progress Notes (Signed)
Subjective:    Patient ID: Daniel Guerrero, male    DOB: 1946/03/07, 70 y.o.   MRN: [id number]  HPI  Patient presents for follow up due to a past medical history of hypertension,hyperglycemia, and hyperlipidemia. He is a pleasant 70 year old male.   All immunizations and health maintenance protocols were reviewed with the patient and needed orders were placed.  Appropriate screening laboratory values were ordered for the patient including screening of hyperlipidemia, renal function and hepatic function. If indicated by BPH, a PSA was ordered.  Medication reconciliation,  past medical history, social history, problem list and allergies were reviewed in detail with the patient  Goals were established with regard to weight loss, exercise, and  diet in compliance with medications. He is eating healthy but is not exercising   Wt Readings from Last 3 Encounters:  04/14/16 218 lb 4.8 oz (99 kg)  01/14/16 213 lb (96.6 kg)  10/19/15 215 lb (97.5 kg)    End of life planning was discussed.  His interval history this year pertains to to cardiac ablation for SVT in January 2017. He has since been seen by Cardiology and reports doing well.   He has been seen by ENT for Meniere's this month.   He needs a colonoscopy. He has not seen the eye doctor.   He does complain of decreased urinary stream but this has not become a bother for him yet. He does not want to go on any medications as this time  Review of Systems  Constitutional: Negative.   HENT: Negative.   Eyes: Negative.   Respiratory: Negative.   Cardiovascular: Negative.   Gastrointestinal: Negative.   Endocrine: Negative.   Genitourinary: Negative.   Musculoskeletal: Negative.   Skin: Negative.   Allergic/Immunologic: Negative.   Neurological: Negative.   Hematological: Negative.   Psychiatric/Behavioral: Negative.   All other systems reviewed and are negative.  Past Medical History:  Diagnosis Date  . Cataract of both  eyes   . Eczema   . Erectile dysfunction   . Hearing loss    high frequency  . History of inguinal hernia repair, bilateral   . Hypertension   . Kidney stones   . Loss of hearing    high frequency  . Meniere disease   . Obesity   . Paroxysmal SVT (supraventricular tachycardia) ([place])   . Psoriasis   . S/P appendectomy   . S/P tonsillectomy   . Seasonal allergies   . Type II or unspecified type diabetes mellitus without mention of complication, uncontrolled    12-29-14 Told he was prediabetic once    Social History   Social History  . Marital status: Married    Spouse name: N/A  . Number of children: N/A  . Years of education: N/A   Occupational History  . Not on file.   Social History Main Topics  . Smoking status: Former Research scientist (life sciences)  . Smokeless tobacco: Never Used     Comment: Quit 20-30 years ago  . Alcohol use 25.2 oz/week    7 Glasses of wine, 28 Cans of beer, 7 Shots of liquor per week     Comment: occasional  . Drug use: No  . Sexual activity: Not on file   Other Topics Concern  . Not on file   Social History Narrative   Daily Caffeine use: 5-6 drinks daily   Exercising 2 times per week   No Drug use  Past Surgical History:  Procedure Laterality Date  . Ablasion    . APPENDECTOMY    . CYSTOSCOPY WITH RETROGRADE PYELOGRAM, URETEROSCOPY AND STENT PLACEMENT Left 12/30/2014   Procedure: CYSTOSCOPY WITH LEFT URETEROSCOPY STONE EXTRACTION WITH STENT;  Surgeon: Franchot Gallo, MD;  Location: [facility];  Service: Urology;  Laterality: Left;  . ELECTROPHYSIOLOGIC STUDY N/A 08/24/2015   Procedure: SVT Ablation;  Surgeon: Thompson Grayer, MD;  Location: [facility];  Service: Cardiovascular;  Laterality: N/A;  . EYE SURGERY     Catracts removed both eye 30 yrs ago  . HERNIA REPAIR     right and left  . HOLMIUM LASER APPLICATION Left [id number]   Procedure: HOLMIUM LASER APPLICATION;  Surgeon: Franchot Gallo, MD;  Location: [facility];  Service: Urology;   Laterality: Left;  . TONSILLECTOMY      Family History  Problem Relation Age of Onset  . Cancer Mother     lung  . Diabetes Father   . Thyroid disease Mother   . Colon cancer Neg Hx   . GI Bleed Father   . Aneurysm Father     Allergies  Allergen Reactions  . Peanut-Containing Drug Products Hives  . Penicillins Hives    Has patient had a PCN reaction causing immediate rash, facial/tongue/throat swelling, SOB or lightheadedness with hypotension: No Has patient had a PCN reaction causing severe rash involving mucus membranes or skin necrosis: No Has patient had a PCN reaction that required hospitalization No Has patient had a PCN reaction occurring within the last 10 years: No If all of the above answers are "NO", then may proceed with Cephalosporin use.    Current Outpatient Prescriptions on File Prior to Visit  Medication Sig Dispense Refill  . aspirin 81 MG chewable tablet Chew 81 mg by mouth daily.    Marland Kitchen atorvastatin (LIPITOR) 20 MG tablet Take 1 tablet (20 mg total) by mouth daily at 6 PM. 30 tablet 3  . b complex vitamins tablet Take 1 tablet by mouth daily.    . clobetasol cream (TEMOVATE) AB-123456789 % Apply 1 application topically as needed.    . Cyanocobalamin (VITAMIN B-12 PO) Take 1 tablet by mouth daily.    . diazepam (VALIUM) 5 MG tablet Take 5 mg by mouth every 8 (eight) hours as needed.  0  . fexofenadine (ALLEGRA) 180 MG tablet Take 180 mg by mouth daily.    . fluticasone (FLONASE) 50 MCG/ACT nasal spray Place 2 sprays into both nostrils daily as needed for allergies or rhinitis.    Marland Kitchen meclizine (ANTIVERT) 25 MG tablet Take 1 tablet (25 mg total) by mouth 3 (three) times daily as needed for dizziness. 30 tablet 0  . triamcinolone ointment (KENALOG) 0.5 % Apply 1 application topically 2 (two) times daily. 30 g 0  . triamterene-hydrochlorothiazide (DYAZIDE) 37.5-25 MG capsule Take 1 capsule by mouth every morning.  5  . vitamin C (ASCORBIC ACID) 500 MG tablet Take 500 mg by  mouth daily.     No current facility-administered medications on file prior to visit.     There were no vitals taken for this visit.      Objective:   Physical Exam  Constitutional: He is oriented to person, place, and time. He appears well-developed and well-nourished. No distress.  obese  HENT:  Head: Normocephalic and atraumatic.  Right Ear: External ear normal.  Left Ear: External ear normal.  Nose: Nose normal.  Mouth/Throat: Oropharynx is clear and moist. No oropharyngeal exudate.  Eyes: Conjunctivae and EOM are normal. Pupils are equal, round, and reactive to light. Right eye exhibits no discharge. Left eye exhibits no discharge. No scleral icterus.  Neck: Normal range of motion. Neck supple. No JVD present. Carotid bruit is not present. No tracheal deviation present. No thyroid mass and no thyromegaly present.  Cardiovascular: Normal rate, regular rhythm, normal heart sounds and intact distal pulses.  Exam reveals no gallop and no friction rub.   No murmur heard. Pulmonary/Chest: Effort normal and breath sounds normal. No stridor. No respiratory distress. He has no wheezes. He has no rales. He exhibits no tenderness.  Abdominal: Soft. Bowel sounds are normal. He exhibits no distension and no mass. There is no tenderness. There is no rebound and no guarding.  Genitourinary: Rectal exam shows external hemorrhoid. Rectal exam shows no internal hemorrhoid, no mass, no tenderness, anal tone normal and guaiac negative stool. Prostate is enlarged. Prostate is not tender.  Musculoskeletal: Normal range of motion. He exhibits no edema, tenderness or deformity.  Lymphadenopathy:    He has no cervical adenopathy.  Neurological: He is alert and oriented to person, place, and time. He has normal reflexes. He displays normal reflexes. No cranial nerve deficit. He exhibits normal muscle tone. Coordination normal.  Skin: Skin is warm and dry. No rash noted. He is not diaphoretic. No erythema.  No pallor.  Psychiatric: He has a normal mood and affect. His behavior is normal. Judgment and thought content normal.  Nursing note and vitals reviewed.      Assessment & Plan:  1. Essential hypertension - Well controlled.  - No change in medication - POCT Urinalysis Dipstick (Automated) - Basic metabolic panel - CBC with Differential/Platelet - Hemoglobin A1c - Hepatic function panel - Lipid panel - TSH  2. Overweight - POCT Urinalysis Dipstick (Automated) - Basic metabolic panel - CBC with Differential/Platelet - Hemoglobin A1c - Hepatic function panel - Lipid panel - TSH - Needs to start exercising - he is eating healthy 3. Hyperglycemia  - POCT Urinalysis Dipstick (Automated) - Basic metabolic panel - CBC with Differential/Platelet - Hemoglobin A1c - Hepatic function panel - Lipid panel - TSH  4. Hyperlipidemia  - POCT Urinalysis Dipstick (Automated) - Basic metabolic panel - CBC with Differential/Platelet - Hemoglobin A1c - Hepatic function panel - Lipid panel - TSH - Is not currently taking Lipitor.  - Will start on lipitor if needed 5. Colon cancer screening  - Ambulatory referral to Gastroenterology  6. Need for vaccination with 13-polyvalent pneumococcal conjugate vaccine - Pneumococcal conjugate vaccine 13-valent IM  7. Need for prophylactic vaccination and inoculation against influenza  - Flu vaccine HIGH DOSE PF (Fluzone High dose)  Dorothyann Peng, NP

## 2016-04-14 NOTE — Patient Instructions (Addendum)
It was great seeing you this morning. I will follow up with you regarding your lab work.   Please follow up with me in one year for your next annual exam   Someone will call you to schedule your colonoscopy  I would also like for you to sign up for an annual wellness visit on a Friday with our nurse Manuela Schwartz. This is a free benefit under medicare that may help [place] find additional ways to help you.   Health Maintenance, Male A healthy lifestyle and preventative care can promote health and wellness.  Maintain regular health, dental, and eye exams.  Eat a healthy diet. Foods like vegetables, fruits, whole grains, low-fat dairy products, and lean protein foods contain the nutrients you need and are low in calories. Decrease your intake of foods high in solid fats, added sugars, and salt. Get information about a proper diet from your health care provider, if necessary.  Regular physical exercise is one of the most important things you can do for your health. Most adults should get at least 150 minutes of moderate-intensity exercise (any activity that increases your heart rate and causes you to sweat) each week. In addition, most adults need muscle-strengthening exercises on 2 or more days a week.   Maintain a healthy weight. The body mass index (BMI) is a screening tool to identify possible weight problems. It provides an estimate of body fat based on height and weight. Your health care provider can find your BMI and can help you achieve or maintain a healthy weight. For males 20 years and older:  A BMI below 18.5 is considered underweight.  A BMI of 18.5 to 24.9 is normal.  A BMI of 25 to 29.9 is considered overweight.  A BMI of 30 and above is considered obese.  Maintain normal blood lipids and cholesterol by exercising and minimizing your intake of saturated fat. Eat a balanced diet with plenty of fruits and vegetables. Blood tests for lipids and cholesterol should begin at age 88 and be  repeated every 5 years. If your lipid or cholesterol levels are high, you are over age 70, or you are at high risk for heart disease, you may need your cholesterol levels checked more frequently.Ongoing high lipid and cholesterol levels should be treated with medicines if diet and exercise are not working.  If you smoke, find out from your health care provider how to quit. If you do not use tobacco, do not start.  Lung cancer screening is recommended for adults aged 29-80 years who are at high risk for developing lung cancer because of a history of smoking. A yearly low-dose CT scan of the lungs is recommended for people who have at least a 30-pack-year history of smoking and are current smokers or have quit within the past 15 years. A pack year of smoking is smoking an average of 1 pack of cigarettes a day for 1 year (for example, a 30-pack-year history of smoking could mean smoking 1 pack a day for 30 years or 2 packs a day for 15 years). Yearly screening should continue until the smoker has stopped smoking for at least 15 years. Yearly screening should be stopped for people who develop a health problem that would prevent them from having lung cancer treatment.  If you choose to drink alcohol, do not have more than 2 drinks per day. One drink is considered to be 12 oz (360 mL) of beer, 5 oz (150 mL) of wine, or 1.5 oz (45  mL) of liquor.  Avoid the use of street drugs. Do not share needles with anyone. Ask for help if you need support or instructions about stopping the use of drugs.  High blood pressure causes heart disease and increases the risk of stroke. High blood pressure is more likely to develop in:  People who have blood pressure in the end of the normal range (100-139/85-89 mm Hg).  People who are overweight or obese.  People who are African American.  If you are 9-23 years of age, have your blood pressure checked every 3-5 years. If you are 26 years of age or older, have your blood  pressure checked every year. You should have your blood pressure measured twice--once when you are at a hospital or clinic, and once when you are not at a hospital or clinic. Record the average of the two measurements. To check your blood pressure when you are not at a hospital or clinic, you can use:  An automated blood pressure machine at a pharmacy.  A home blood pressure monitor.  If you are 85-62 years old, ask your health care provider if you should take aspirin to prevent heart disease.  Diabetes screening involves taking a blood sample to check your fasting blood sugar level. This should be done once every 3 years after age 60 if you are at a normal weight and without risk factors for diabetes. Testing should be considered at a younger age or be carried out more frequently if you are overweight and have at least 1 risk factor for diabetes.  Colorectal cancer can be detected and often prevented. Most routine colorectal cancer screening begins at the age of 38 and continues through age 51. However, your health care provider may recommend screening at an earlier age if you have risk factors for colon cancer. On a yearly basis, your health care provider may provide home test kits to check for hidden blood in the stool. A small camera at the end of a tube may be used to directly examine the colon (sigmoidoscopy or colonoscopy) to detect the earliest forms of colorectal cancer. Talk to your health care provider about this at age 28 when routine screening begins. A direct exam of the colon should be repeated every 5-10 years through age 51, unless early forms of precancerous polyps or small growths are found.  People who are at an increased risk for hepatitis B should be screened for this virus. You are considered at high risk for hepatitis B if:  You were born in a country where hepatitis B occurs often. Talk with your health care provider about which countries are considered high risk.  Your  parents were born in a high-risk country and you have not received a shot to protect against hepatitis B (hepatitis B vaccine).  You have HIV or AIDS.  You use needles to inject street drugs.  You live with, or have sex with, someone who has hepatitis B.  You are a man who has sex with other men (MSM).  You get hemodialysis treatment.  You take certain medicines for conditions like cancer, organ transplantation, and autoimmune conditions.  Hepatitis C blood testing is recommended for all people born from [birth year] through 1965 and any individual with known risk factors for hepatitis C.  Healthy men should no longer receive prostate-specific antigen (PSA) blood tests as part of routine cancer screening. Talk to your health care provider about prostate cancer screening.  Testicular cancer screening is not recommended for adolescents  or adult males who have no symptoms. Screening includes self-exam, a health care provider exam, and other screening tests. Consult with your health care provider about any symptoms you have or any concerns you have about testicular cancer.  Practice safe sex. Use condoms and avoid high-risk sexual practices to reduce the spread of sexually transmitted infections (STIs).  You should be screened for STIs, including gonorrhea and chlamydia if:  You are sexually active and are younger than 24 years.  You are older than 24 years, and your health care provider tells you that you are at risk for this type of infection.  Your sexual activity has changed since you were last screened, and you are at an increased risk for chlamydia or gonorrhea. Ask your health care provider if you are at risk.  If you are at risk of being infected with HIV, it is recommended that you take a prescription medicine daily to prevent HIV infection. This is called pre-exposure prophylaxis (PrEP). You are considered at risk if:  You are a man who has sex with other men (MSM).  You are a  heterosexual man who is sexually active with multiple partners.  You take drugs by injection.  You are sexually active with a partner who has HIV.  Talk with your health care provider about whether you are at high risk of being infected with HIV. If you choose to begin PrEP, you should first be tested for HIV. You should then be tested every 3 months for as long as you are taking PrEP.  Use sunscreen. Apply sunscreen liberally and repeatedly throughout the day. You should seek shade when your shadow is shorter than you. Protect yourself by wearing long sleeves, pants, a wide-brimmed hat, and sunglasses year round whenever you are outdoors.  Tell your health care provider of new moles or changes in moles, especially if there is a change in shape or color. Also, tell your health care provider if a mole is larger than the size of a pencil eraser.  A one-time screening for abdominal aortic aneurysm (AAA) and surgical repair of large AAAs by ultrasound is recommended for men aged 24-75 years who are current or former smokers.  Stay current with your vaccines (immunizations).   This information is not intended to replace advice given to you by your health care provider. Make sure you discuss any questions you have with your health care provider.   Document Released: 01/06/2008 Document Revised: 07/31/2014 Document Reviewed: 12/05/2010 Elsevier Interactive Patient Education [employer].

## 2016-04-27 ENCOUNTER — Ambulatory Visit (AMBULATORY_SURGERY_CENTER): Payer: Self-pay | Admitting: *Deleted

## 2016-04-27 VITALS — Ht 70.0 in | Wt 220.0 lb

## 2016-04-27 DIAGNOSIS — Z1211 Encounter for screening for malignant neoplasm of colon: Secondary | ICD-10-CM

## 2016-04-27 MED ORDER — NA SULFATE-K SULFATE-MG SULF 17.5-3.13-1.6 GM/177ML PO SOLN: 1.0000 | Freq: Once | ORAL | 0 refills | Status: AC

## 2016-04-27 NOTE — Progress Notes (Signed)
No egg or soy allergy known to patient  No issues with past sedation with any surgeries  or procedures, no intubation problems  No diet pills per patient No home 02 use per patient  No blood thinners per patient  Pt denies issues with constipation  No A fib or A flutter   07-2015 ablation for SVT

## 2016-05-18 ENCOUNTER — Ambulatory Visit (AMBULATORY_SURGERY_CENTER): Payer: Medicare Other | Admitting: Internal Medicine

## 2016-05-18 ENCOUNTER — Encounter: Payer: Self-pay | Admitting: Internal Medicine

## 2016-05-18 VITALS — BP 127/54 | HR 49 | Temp 97.1°F | Resp 9 | Ht 70.0 in | Wt 220.0 lb

## 2016-05-18 DIAGNOSIS — Z1211 Encounter for screening for malignant neoplasm of colon: Secondary | ICD-10-CM | POA: Diagnosis not present

## 2016-05-18 DIAGNOSIS — D125 Benign neoplasm of sigmoid colon: Secondary | ICD-10-CM | POA: Diagnosis not present

## 2016-05-18 DIAGNOSIS — D123 Benign neoplasm of transverse colon: Secondary | ICD-10-CM

## 2016-05-18 DIAGNOSIS — D120 Benign neoplasm of cecum: Secondary | ICD-10-CM

## 2016-05-18 DIAGNOSIS — F419 Anxiety disorder, unspecified: Secondary | ICD-10-CM | POA: Diagnosis not present

## 2016-05-18 DIAGNOSIS — R42 Dizziness and giddiness: Secondary | ICD-10-CM | POA: Diagnosis not present

## 2016-05-18 DIAGNOSIS — Z1212 Encounter for screening for malignant neoplasm of rectum: Secondary | ICD-10-CM | POA: Diagnosis not present

## 2016-05-18 DIAGNOSIS — I440 Atrioventricular block, first degree: Secondary | ICD-10-CM | POA: Diagnosis not present

## 2016-05-18 DIAGNOSIS — I10 Essential (primary) hypertension: Secondary | ICD-10-CM | POA: Diagnosis not present

## 2016-05-18 MED ORDER — SODIUM CHLORIDE 0.9 % IV SOLN: 500.0000 mL | INTRAVENOUS | Status: DC

## 2016-05-18 NOTE — Progress Notes (Signed)
Report to PACU, RN, vss, BBS= Clear.  

## 2016-05-18 NOTE — Op Note (Signed)
[facility] Patient Name: Daniel Guerrero Procedure Date: 05/18/2016 1:35 PM MRN: [id number] Endoscopist: Docia Chuck. Henrene Pastor , MD Age: 70 Referring MD:  Date of Birth: 1945-08-20 Gender: Male Account #: [id number] Procedure:                Colonoscopy with snare polypectomy x 6 Indications:              Screening for colorectal malignant neoplasm Medicines:                Monitored Anesthesia Care Procedure:                Pre-Anesthesia Assessment:                           - Prior to the procedure, a History and Physical                            was performed, and patient medications and                            allergies were reviewed. The patient's tolerance of                            previous anesthesia was also reviewed. The risks                            and benefits of the procedure and the sedation                            options and risks were discussed with the patient.                            All questions were answered, and informed consent                            was obtained. Prior Anticoagulants: The patient has                            taken no previous anticoagulant or antiplatelet                            agents. ASA Grade Assessment: II - A patient with                            mild systemic disease. After reviewing the risks                            and benefits, the patient was deemed in                            satisfactory condition to undergo the procedure.                           After obtaining informed consent, the colonoscope  was passed under direct vision. Throughout the                            procedure, the patient's blood pressure, pulse, and                            oxygen saturations were monitored continuously. The                            Model CF-HQ190L [id number]) scope was introduced                            through the anus and advanced to the the cecum,         identified by appendiceal orifice and ileocecal                            valve. The ileocecal valve, appendiceal orifice,                            and rectum were photographed. The quality of the                            bowel preparation was excellent. The colonoscopy                            was performed without difficulty. The patient                            tolerated the procedure well. The bowel preparation                            used was SUPREP. Scope In: 1:43:52 PM Scope Out: 2:03:12 PM Scope Withdrawal Time: 0 hours 15 minutes 45 seconds  Total Procedure Duration: 0 hours 19 minutes 20 seconds  Findings:                 Six polyps were found in the sigmoid colon,                            transverse colon and cecum. The polyps were 2 to 5                            mm in size. These polyps were removed with a cold                            snare. Resection and retrieval were complete.                           Many medium-mouthed diverticula were found in the                            entire colon.  Internal hemorrhoids were found during retroflexion. Complications:            No immediate complications. Estimated blood loss:                            None. Estimated Blood Loss:     Estimated blood loss: none. Impression:               - Six 2 to 5 mm polyps in the sigmoid colon, in the                            transverse colon and in the cecum, removed with a                            cold snare. Resected and retrieved.                           - Diverticulosis in the entire examined colon.                           - Internal hemorrhoids. Recommendation:           - Repeat colonoscopy in 3 - 5 years for                            surveillance, pending pathology.                           - Patient has a contact number available for                            emergencies. The signs and symptoms of potential                             delayed complications were discussed with the                            patient. Return to normal activities tomorrow.                            Written discharge instructions were provided to the                            patient.                           - Resume previous diet.                           - Continue present medications.                           - Await pathology results. Docia Chuck. Henrene Pastor, MD 05/18/2016 2:08:33 PM This report has been signed electronically.

## 2016-05-18 NOTE — Patient Instructions (Signed)
YOU HAD AN ENDOSCOPIC PROCEDURE TODAY AT THE [facility]:   Refer to the procedure report that was given to you for any specific questions about what was found during the examination.  If the procedure report does not answer your questions, please call your gastroenterologist to clarify.  If you requested that your care partner not be given the details of your procedure findings, then the procedure report has been included in a sealed envelope for you to review at your convenience later.  YOU SHOULD EXPECT: Some feelings of bloating in the abdomen. Passage of more gas than usual.  Walking can help get rid of the air that was put into your GI tract during the procedure and reduce the bloating. If you had a lower endoscopy (such as a colonoscopy or flexible sigmoidoscopy) you may notice spotting of blood in your stool or on the toilet paper. If you underwent a bowel prep for your procedure, you may not have a normal bowel movement for a few days.  Please Note:  You might notice some irritation and congestion in your nose or some drainage.  This is from the oxygen used during your procedure.  There is no need for concern and it should clear up in a day or so.  SYMPTOMS TO REPORT IMMEDIATELY:   Following lower endoscopy (colonoscopy or flexible sigmoidoscopy):  Excessive amounts of blood in the stool  Significant tenderness or worsening of abdominal pains  Swelling of the abdomen that is new, acute  Fever of 100F or higher  For urgent or emergent issues, a gastroenterologist can be reached at any hour by calling [phone].   DIET:  We do recommend a small meal at first, but then you may proceed to your regular diet.  Drink plenty of fluids but you should avoid alcoholic beverages for 24 hours.  ACTIVITY:  You should plan to take it easy for the rest of today and you should NOT DRIVE or use heavy machinery until tomorrow (because of the sedation medicines used during the test).     FOLLOW UP: Our staff will call the number listed on your records the next business day following your procedure to check on you and address any questions or concerns that you may have regarding the information given to you following your procedure. If we do not reach you, we will leave a message.  However, if you are feeling well and you are not experiencing any problems, there is no need to return our call.  We will assume that you have returned to your regular daily activities without incident.  If any biopsies were taken you will be contacted by phone or by letter within the next 1-3 weeks.  Please call us at [phone] if you have not heard about the biopsies in 3 weeks.   SIGNATURES/CONFIDENTIALITY: You and/or your care partner have signed paperwork which will be entered into your electronic medical record.  These signatures attest to the fact that that the information above on your After Visit Summary has been reviewed and is understood.  Full responsibility of the confidentiality of this discharge information lies with you and/or your care-partner.  Await pathology  Please read over handouts about polyps, diverticulosis, hemorrhoids and high fiber diets  Please continue your normal medications 

## 2016-05-18 NOTE — Progress Notes (Signed)
Called to room to assist during endoscopic procedure.  Patient ID and intended procedure confirmed with present staff. Received instructions for my participation in the procedure from the performing physician.  

## 2016-05-19 ENCOUNTER — Telehealth: Payer: Self-pay

## 2016-05-19 NOTE — Telephone Encounter (Signed)
  Follow up Call-  Call back number 05/18/2016  Post procedure Call Back phone  # [phone]  Permission to leave phone message Yes  Some recent data might be hidden     Patient questions:  Do you have a fever, pain , or abdominal swelling? No. Pain Score  0 *  Have you tolerated food without any problems? Yes.    Have you been able to return to your normal activities? Yes.    Do you have any questions about your discharge instructions: Diet   No. Medications  No. Follow up visit  No.  Do you have questions or concerns about your Care? No.  Actions: * If pain score is 4 or above: No action needed, pain <4.

## 2016-05-19 NOTE — Telephone Encounter (Signed)
Unable to leave message line busy. Attempted times 2.

## 2016-05-24 ENCOUNTER — Encounter: Payer: Self-pay | Admitting: Internal Medicine

## 2016-06-21 IMAGING — CT CT RENAL STONE PROTOCOL
2 of 4 series · 16 of 46 positions shown, 18 images · non-contrast
Comparison: Abdominal ultrasound 08/22/2010

CLINICAL DATA: Sudden onset of left flank pain. Left lower quadrant
pain.

EXAM:
CT ABDOMEN AND PELVIS WITHOUT CONTRAST
TECHNIQUE: Multidetector CT imaging of the abdomen and pelvis was performed
following the standard protocol without IV contrast.

[Series 2: standard/full over (age)lbs 5.0 · axial · 0.90mm/px · z∈[-478,-2]mm · 13 of 105 slices shown, 15 images]
[im 5/105  soft-tissue]
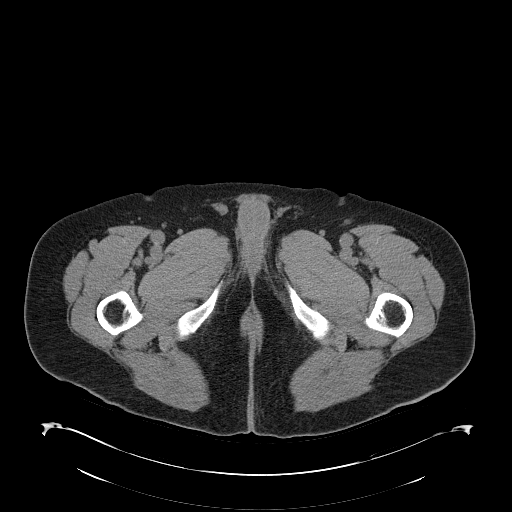
[im 5/105  bone]
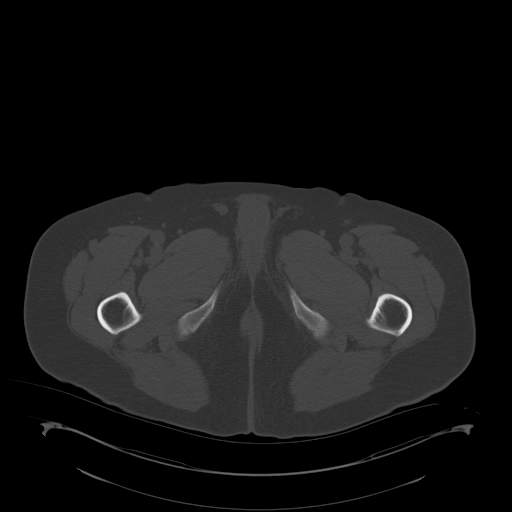
[im 14/105  soft-tissue]
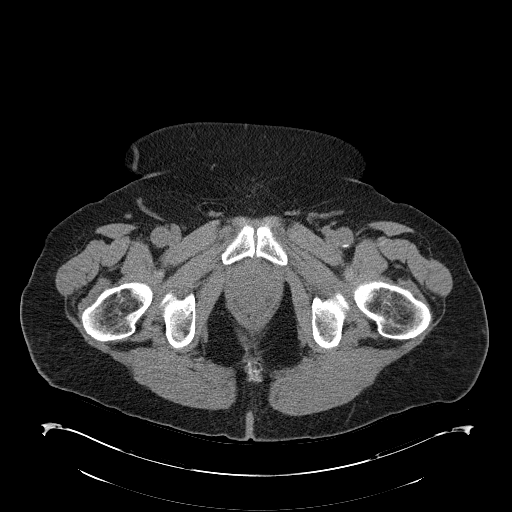
[im 23/105  soft-tissue]
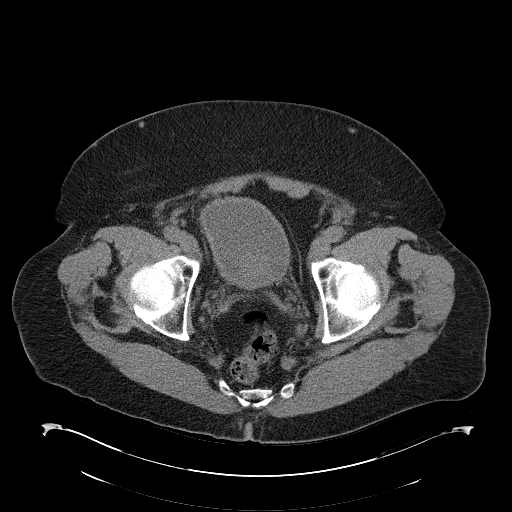
[im 28/105  soft-tissue]
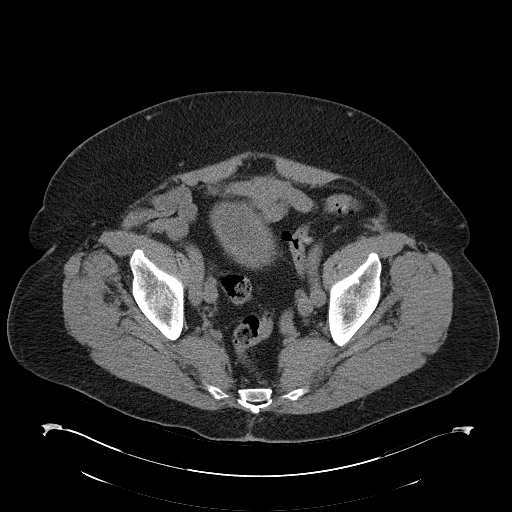
[im 37/105  soft-tissue]
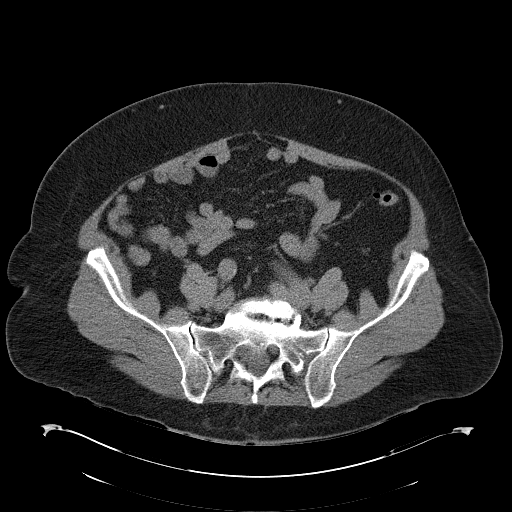
[im 46/105  soft-tissue]
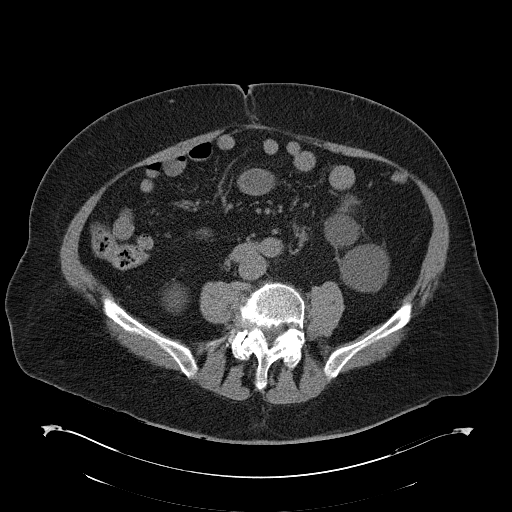
[im 55/105  soft-tissue]
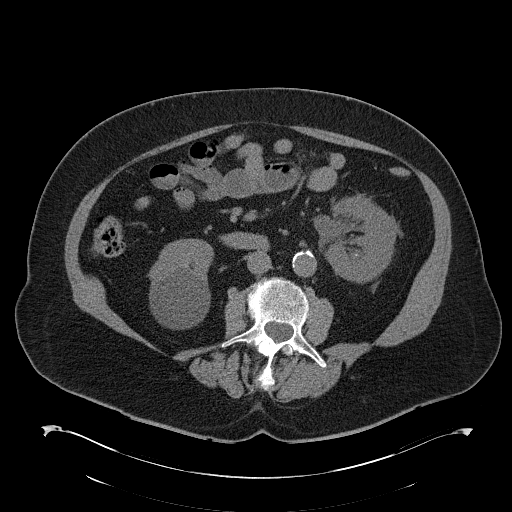
[im 59/105  soft-tissue]
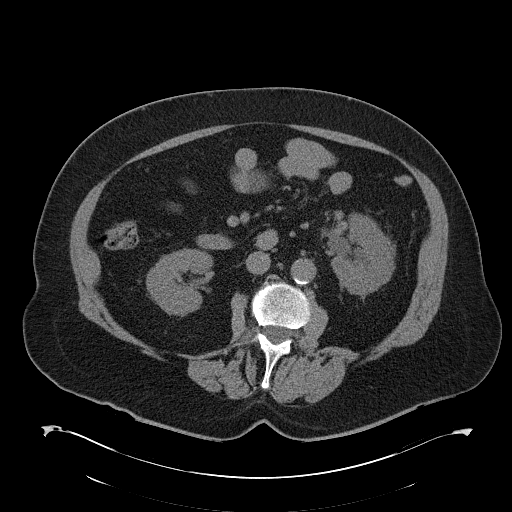
[im 68/105  soft-tissue]
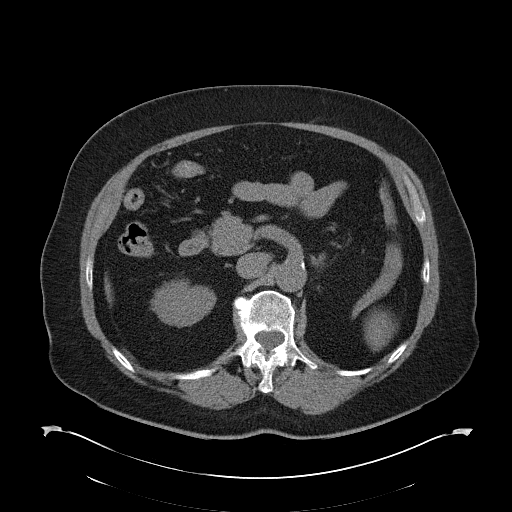
[im 68/105  bone]
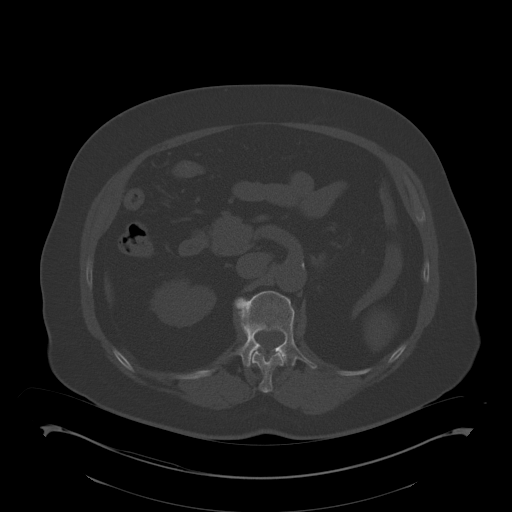
[im 77/105  soft-tissue]
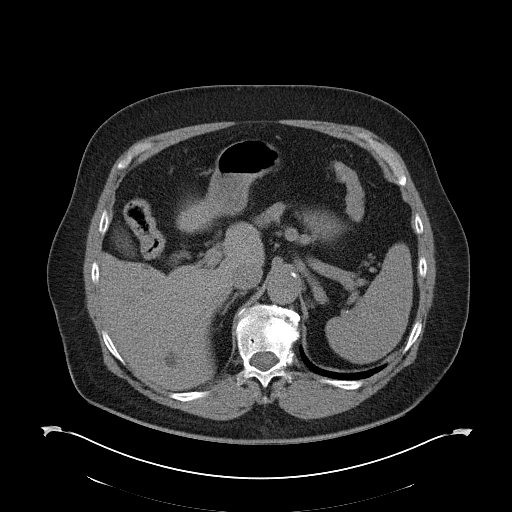
[im 82/105  soft-tissue]
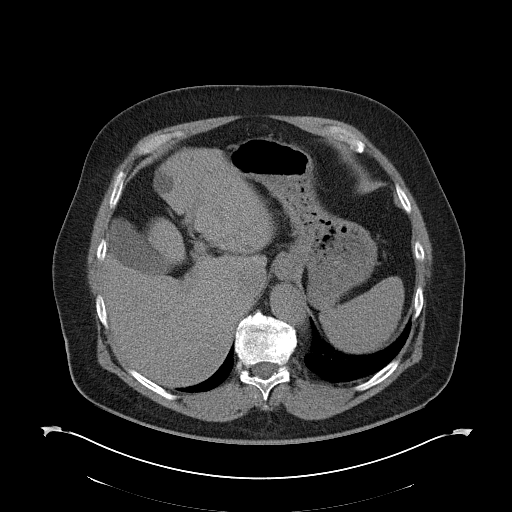
[im 91/105  soft-tissue]
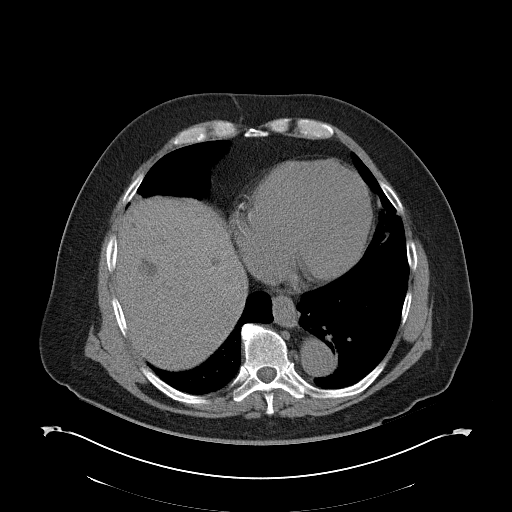
[im 100/105  soft-tissue]
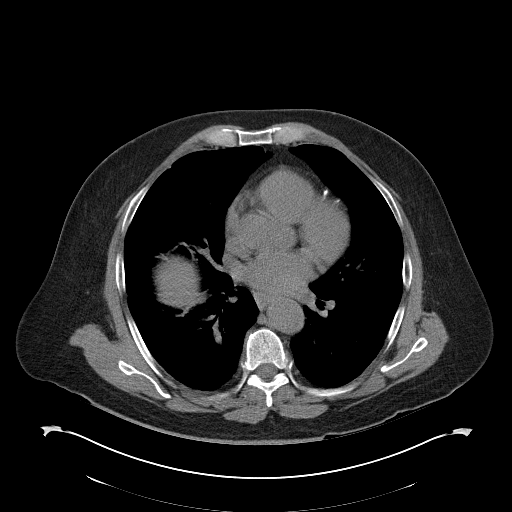

[Series 3: mpr coronal · coronal · 1.03mm/px · 3 of 102 slices shown]
[im 34/102  soft-tissue]
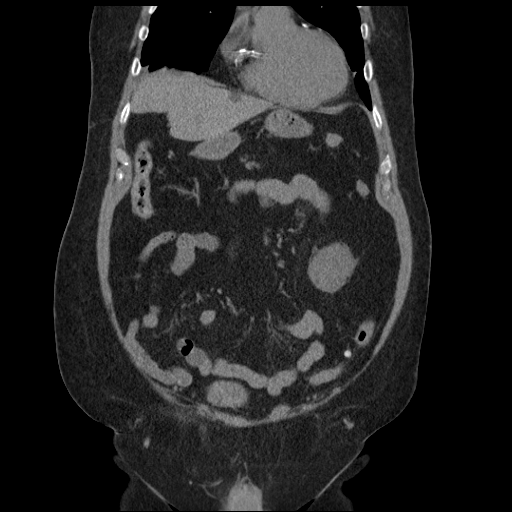
[im 45/102  soft-tissue]
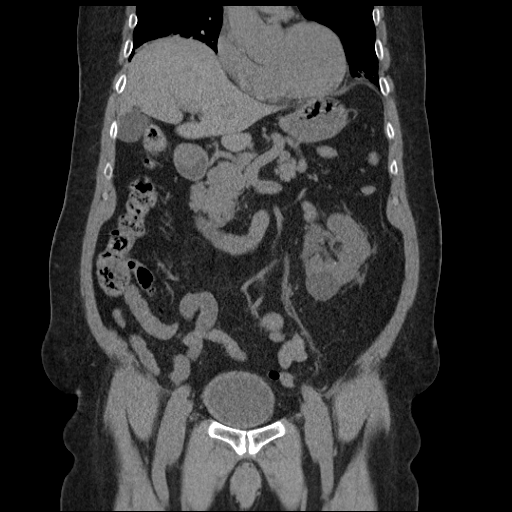
[im 57/102  soft-tissue]
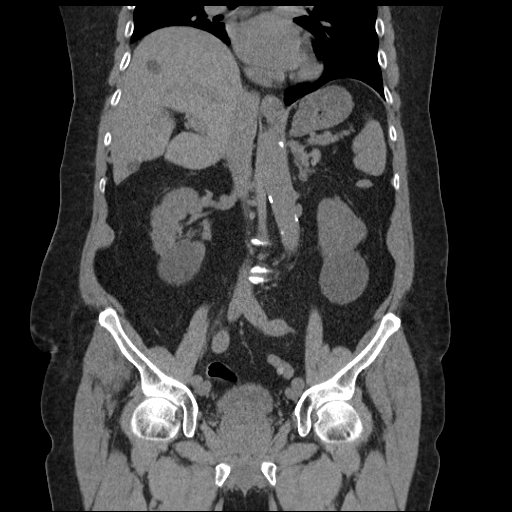

[16 of 46 positions shown; findings below may reference images not displayed]

FINDINGS: Mild atelectasis at the lung bases and lingula. There are coronary
artery calcifications.

There is a 7 mm obstructing stone in the left proximal ureter at the
level of L4 with resultant moderate hydroureteronephrosis and mild
perinephric stranding. Punctate nonobstructing stone in the upper
left kidney. Multiple left renal cysts in the lower pole, largest
measuring 5.5 cm.

No right renal stones or obstructive uropathy. Cyst in the lower
right kidney measures 5.8 cm.

Evaluation of the remaining solid and hollow viscera is limited
given lack of contrast. Multiple cysts scattered throughout the
liver, ranging from 1-2 cm. No evidence of suspicious lesion
allowing for lack contrast. Gallbladder is physiologically
distended. The unenhanced spleen, adrenal glands, and pancreas are
normal.

Small hiatal hernia. Stomach is physiologically distended. There are
no dilated or thickened bowel loops. The appendix not visualized.
Small volume of colonic stool with scattered colonic diverticula, no
diverticulitis.

No free air, free fluid, or intra-abdominal fluid collection. Small
fat containing umbilical hernia.

No retroperitoneal adenopathy. Abdominal aorta is normal in caliber.
Tortuosity and atherosclerosis of the abdominal aorta.

Within the pelvis the urinary bladder is minimally distended. Mild
diffuse bladder wall thickening, likely sequela of enlarged prostate
gland. No bladder stones. There is fat within the right inguinal
canal. No pelvic free fluid. No pelvic adenopathy.

There are no acute or suspicious osseous abnormalities. There is
multilevel degenerative change throughout the lumbar spine.
Bilateral L4 pars interarticularis defects without listhesis.
IMPRESSION: 1. Obstructing 7 mm stone in the left proximal ureter with resultant
moderate hydroureteronephrosis and mild perinephric stranding.
Additional nonobstructing stone in the upper left kidney.
2. Mild bladder wall thickening, suspect related to chronic bladder
outlet obstruction related to enlarged prostate gland.
3. Chronic findings include diverticulosis without diverticulitis,
hepatic and renal cysts, and degenerative change in the spine.

## 2016-06-22 IMAGING — DX DG ABDOMEN 1V
1 series · 1 of 1 positions shown · non-contrast
Comparison: Abdominal CT scan dated December 28, 2014

CLINICAL DATA: Left ureteral calculus; left-sided abdominal pain
since yesterday

EXAM:
ABDOMEN - 1 VIEW

[abdomen supine]
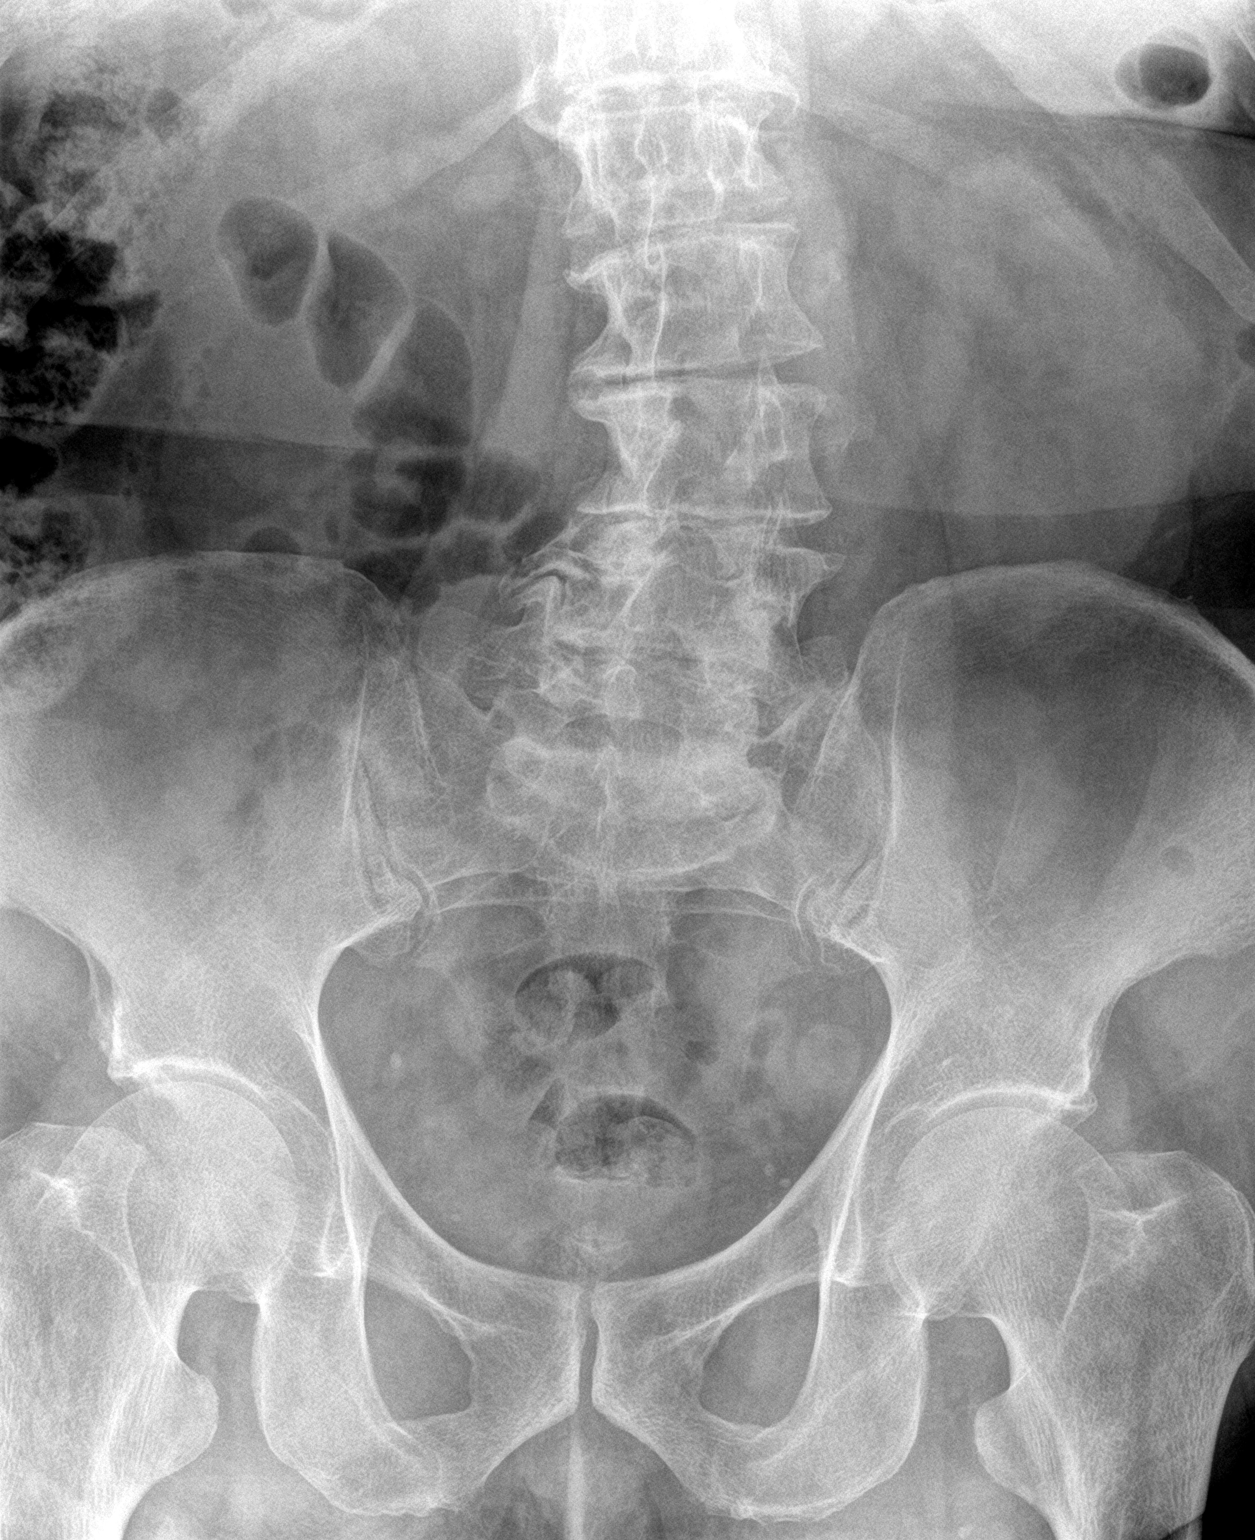

[1 of 1 positions shown; findings below may reference images not displayed]

FINDINGS: A known stone in the proximal left ureter may be faintly visible
just lateral to the body of L5. Within the pelvis there are
phleboliths which were demonstrated on yesterday's study. No
definite stones overlying the right kidney are observed. There are
degenerative changes of the lumbar spine and both hips. The bowel
gas pattern is unremarkable.
IMPRESSION: The patient has a known 7 mm left-sided proximal ureteral stone. It
may be faintly visible lateral to the body of L5. No definite stones
are demonstrated elsewhere.

## 2016-07-05 ENCOUNTER — Other Ambulatory Visit: Payer: Self-pay | Admitting: Internal Medicine

## 2016-07-05 DIAGNOSIS — I714 Abdominal aortic aneurysm, without rupture, unspecified: Secondary | ICD-10-CM

## 2016-07-11 ENCOUNTER — Other Ambulatory Visit: Payer: Self-pay | Admitting: *Deleted

## 2016-07-11 DIAGNOSIS — I712 Thoracic aortic aneurysm, without rupture, unspecified: Secondary | ICD-10-CM

## 2016-07-12 ENCOUNTER — Telehealth: Payer: Self-pay | Admitting: Adult Health

## 2016-07-12 NOTE — Telephone Encounter (Signed)
[facility]  Patient Name: Daniel Guerrero  DOB: 10-24-1945    Initial Comment Caller states he woke up this morning, he has a artery bleeding on his ankle. He got it stopped bleeding.    Nurse Assessment  Nurse: Wayne Sever, RN, Tillie Rung Date/Time (Eastern Time): 07/12/2016 9:16:40 AM  Confirm and document reason for call. If symptomatic, describe symptoms. ---He had some bleeding on his ankle that was squirting out blood. He states he was scratching and then it started to bleed. He states he put pressure on it and this time it stopped on his own. He wants to know what to do now. He was going to ER, but since the bleeding has stopped he is not going now.  Does the patient have any new or worsening symptoms? ---Yes  Will a triage be completed? ---Yes  Related visit to physician within the last 2 weeks? ---No  Does the PT have any chronic conditions? (i.e. diabetes, asthma, etc.) ---Yes  List chronic conditions. ---HTN  Is this a behavioral health or substance abuse call? ---No     Guidelines    Guideline Title Affirmed Question Affirmed Notes  Skin Injury Minor cut or scratch (all triage questions negative)    Final Disposition User   [place], RN, Tillie Rung    Disagree/Comply: Comply

## 2016-07-12 NOTE — Telephone Encounter (Signed)
Spoke with pt and he states that d/t psoriasis he tends to scratch in his sleep. He scratched a place on his ankle and it was bleeding profusely. He states that it was "squirting out blood". Pt states that this has happened once before as well. Pt reports that previously he went to ED and was told it was an artery bleed and it was cauterized. Pt states that after about 64mins of pressure he was able to stop bleeding today. Pt states that he was told by [facility] to apply "liquid bandage" to the site. Advised pt that he needs to apply compression bandage for next 24hours at least. Also advised that if bleeding restarts or he noticed hematoma starting to form then he needs to go to ED immediately for treatment. Pt voiced understanding. Pt also states he stopped aspirin last week d/t increase in nose bleeds. Appt scheduled with [name] for tomorrow. Nothing further needed at this time.

## 2016-07-13 ENCOUNTER — Encounter: Payer: Self-pay | Admitting: Adult Health

## 2016-07-13 ENCOUNTER — Ambulatory Visit (HOSPITAL_COMMUNITY)
Admission: RE | Admit: 2016-07-13 | Discharge: 2016-07-13 | Disposition: A | Discharge status: Home / Self Care | Payer: Medicare Other | Source: Ambulatory Visit | Attending: Cardiology | Admitting: Cardiology

## 2016-07-13 ENCOUNTER — Ambulatory Visit (INDEPENDENT_AMBULATORY_CARE_PROVIDER_SITE_OTHER): Payer: Medicare Other | Admitting: Adult Health

## 2016-07-13 VITALS — BP 142/70 | Temp 97.7°F | Ht 70.0 in | Wt 226.5 lb

## 2016-07-13 DIAGNOSIS — I714 Abdominal aortic aneurysm, without rupture, unspecified: Secondary | ICD-10-CM

## 2016-07-13 DIAGNOSIS — I8390 Asymptomatic varicose veins of unspecified lower extremity: Secondary | ICD-10-CM | POA: Diagnosis not present

## 2016-07-13 NOTE — Progress Notes (Signed)
Subjective:    Patient ID: Daniel Guerrero, male    DOB: 09-Dec-1945, 70 y.o.   MRN: [id number]  HPI  70 year old male who presents to the office today s/p varicose vein rupture 1 day ago. He reports that he woke up yesterday morning and was walking downstairs to get coffee, at this time he noticed that his left foot felt wet. He reports " there was blood just squirting out of my right ankle." He felt as though he was going to have to go to the ER. This has happened in the past and he had to have it cauterized  with silver nitrate. He called [facility] and they advised a liquid bandage. He was then advised by one of our nurses to use a compression bandage. He used both and was able to get the bleeding to stop.   Review of Systems  Constitutional: Negative.   Respiratory: Negative.   Cardiovascular: Negative.   Skin: Positive for wound.  Neurological: Negative.   All other systems reviewed and are negative.  Past Medical History:  Diagnosis Date  . Allergy   . Cataract of both eyes    implants both eyes   . Eczema   . Enlarged aorta ([place])   . Erectile dysfunction   . Hearing loss    high frequency  . History of inguinal hernia repair, bilateral   . Hypertension   . Kidney stones   . Loss of hearing    high frequency  . Meniere disease   . Obesity   . Paroxysmal SVT (supraventricular tachycardia) ([place])   . Psoriasis   . S/P appendectomy   . S/P tonsillectomy   . Seasonal allergies   . Type II or unspecified type diabetes mellitus without mention of complication, uncontrolled    12-29-14 Told he was prediabetic once    Social History   Social History  . Marital status: Married    Spouse name: N/A  . Number of children: N/A  . Years of education: N/A   Occupational History  . Not on file.   Social History Main Topics  . Smoking status: Former Research scientist (life sciences)  . Smokeless tobacco: Never Used     Comment: Quit 20-30 years ago  . Alcohol use 25.2 oz/week    7 Glasses of  wine, 28 Cans of beer, 7 Shots of liquor per week     Comment: occasional  . Drug use: No  . Sexual activity: Not on file   Other Topics Concern  . Not on file   Social History Narrative   Daily Caffeine use: 5-6 drinks daily   Exercising 2 times per week   No Drug use             Past Surgical History:  Procedure Laterality Date  . Ablasion    . APPENDECTOMY    . COLONOSCOPY     unsure where or when- states greater than 10 yrs ago per pt- was normal per pt and his wife   . CYSTOSCOPY WITH RETROGRADE PYELOGRAM, URETEROSCOPY AND STENT PLACEMENT Left 12/30/2014   Procedure: CYSTOSCOPY WITH LEFT URETEROSCOPY STONE EXTRACTION WITH STENT;  Surgeon: Franchot Gallo, MD;  Location: [facility];  Service: Urology;  Laterality: Left;  . ELECTROPHYSIOLOGIC STUDY N/A 08/24/2015   Procedure: SVT Ablation;  Surgeon: Thompson Grayer, MD;  Location: [place] [facility];  Service: Cardiovascular;  Laterality: N/A;  . EYE SURGERY     Catracts removed both eye 30 yrs ago  .  HERNIA REPAIR     right and left  . HOLMIUM LASER APPLICATION Left [id number]   Procedure: HOLMIUM LASER APPLICATION;  Surgeon: Franchot Gallo, MD;  Location: [facility];  Service: Urology;  Laterality: Left;  . TONSILLECTOMY      Family History  Problem Relation Age of Onset  . Cancer Mother     lung  . Thyroid disease Mother   . Diabetes Father   . GI Bleed Father   . Aneurysm Father   . Colon cancer Neg Hx   . Colon polyps Neg Hx   . Rectal cancer Neg Hx   . Stomach cancer Neg Hx     Allergies  Allergen Reactions  . Peanut-Containing Drug Products Hives  . Penicillins Hives    Has patient had a PCN reaction causing immediate rash, facial/tongue/throat swelling, SOB or lightheadedness with hypotension: No Has patient had a PCN reaction causing severe rash involving mucus membranes or skin necrosis: No Has patient had a PCN reaction that required hospitalization No Has patient had a PCN reaction occurring within the  last 10 years: No If all of the above answers are "NO", then may proceed with Cephalosporin use.    Current Outpatient Prescriptions on File Prior to Visit  Medication Sig Dispense Refill  . aspirin 81 MG chewable tablet Chew 81 mg by mouth daily.    Marland Kitchen atorvastatin (LIPITOR) 20 MG tablet Take 1 tablet (20 mg total) by mouth daily at 6 PM. 30 tablet 3  . b complex vitamins tablet Take 1 tablet by mouth daily.    . clobetasol cream (TEMOVATE) [id number] % Apply 1 application topically as needed. 30 g 11  . Cyanocobalamin (VITAMIN B-12 PO) Take 1 tablet by mouth daily.    . diazepam (VALIUM) 5 MG tablet Take 1 tablet (5 mg total) by mouth every 8 (eight) hours as needed. 30 tablet 0  . fexofenadine (ALLEGRA) 180 MG tablet Take 180 mg by mouth daily.    . fluticasone (FLONASE) 50 MCG/ACT nasal spray Place 2 sprays into both nostrils daily as needed for allergies or rhinitis.    Marland Kitchen meclizine (ANTIVERT) 25 MG tablet Take 1 tablet (25 mg total) by mouth 3 (three) times daily as needed for dizziness. 30 tablet 0  . triamcinolone ointment (KENALOG) 0.5 % Apply 1 application topically 2 (two) times daily. 30 g 0  . triamterene-hydrochlorothiazide (DYAZIDE) 37.5-25 MG capsule Take 1 capsule by mouth every morning.  5  . vitamin C (ASCORBIC ACID) 500 MG tablet Take 500 mg by mouth daily.     Current Facility-Administered Medications on File Prior to Visit  Medication Dose Route Frequency Provider Last Rate Last Dose  . 0.9 %  sodium chloride infusion  500 mL Intravenous Continuous Irene Shipper, MD        BP (!) 142/70   Temp 97.7 F (36.5 C) (Oral)   Ht 5\' 10"  (1.778 m)   Wt 226 lb 8 oz (102.7 kg)   BMI 32.50 kg/m       Objective:   Physical Exam  Constitutional: He is oriented to person, place, and time. He appears well-developed and well-nourished. No distress.  Cardiovascular: Normal rate, regular rhythm, normal heart sounds and intact distal pulses.  Exam reveals no gallop and no friction rub.    No murmur heard. Pulmonary/Chest: Effort normal and breath sounds normal. No respiratory distress. He has no wheezes. He has no rales. He exhibits no tenderness.  Neurological: He is alert and oriented  to person, place, and time.  Skin: Skin is warm and dry. He is not diaphoretic. No erythema. No pallor.  Well healing wound on right medial heel. Varicose veins noted.   Psychiatric: He has a normal mood and affect. His behavior is normal. Judgment and thought content normal.  Nursing note and vitals reviewed.     Assessment & Plan:  1. Varicose vein of leg - Ambulatory referral to Vascular Surgery - Advised compression socks  - Follow up as needed  Dorothyann Peng, NP

## 2016-07-18 ENCOUNTER — Encounter: Payer: Self-pay | Admitting: Vascular Surgery

## 2016-07-18 ENCOUNTER — Other Ambulatory Visit: Payer: Self-pay | Admitting: *Deleted

## 2016-07-18 ENCOUNTER — Ambulatory Visit (INDEPENDENT_AMBULATORY_CARE_PROVIDER_SITE_OTHER): Payer: Medicare Other | Admitting: Vascular Surgery

## 2016-07-18 ENCOUNTER — Ambulatory Visit (HOSPITAL_COMMUNITY)
Admission: RE | Admit: 2016-07-18 | Discharge: 2016-07-18 | Disposition: A | Discharge status: Home / Self Care | Payer: Medicare Other | Source: Ambulatory Visit | Attending: Vascular Surgery | Admitting: Vascular Surgery

## 2016-07-18 VITALS — BP 131/85 | HR 56 | Temp 97.0°F | Resp 20 | Ht 70.0 in | Wt 224.4 lb

## 2016-07-18 DIAGNOSIS — I83891 Varicose veins of right lower extremities with other complications: Secondary | ICD-10-CM | POA: Diagnosis not present

## 2016-07-18 DIAGNOSIS — I83811 Varicose veins of right lower extremities with pain: Secondary | ICD-10-CM | POA: Insufficient documentation

## 2016-07-18 DIAGNOSIS — R600 Localized edema: Secondary | ICD-10-CM | POA: Diagnosis not present

## 2016-07-18 NOTE — Progress Notes (Signed)
Subjective:     Patient ID: Daniel Guerrero, male   DOB: 10-17-1945, 70 y.o.   MRN: [id number]  HPI This 70 year old male was referred by Dr.Nafziger for evaluation of varicose veins in the right leg. Patient had an episode of bleeding in the right ankle from a varicosity. He had previously had a bleeding episode 1 year ago from the same area. He has required one trip to the emergency department but the second episode he was able to stop by compression. He has no history of DVT thrombophlebitis stasis ulcers. He does not elastic compression stockings. He's had no previous treatment of varicose veins. He is very concerned about further bleeding episodes.  Past Medical History:  Diagnosis Date  . Allergy   . Cataract of both eyes    implants both eyes   . Eczema   . Enlarged aorta ([place])   . Erectile dysfunction   . Hearing loss    high frequency  . History of inguinal hernia repair, bilateral   . Hypertension   . Kidney stones   . Loss of hearing    high frequency  . Meniere disease   . Obesity   . Paroxysmal SVT (supraventricular tachycardia) ([place])   . Psoriasis   . S/P appendectomy   . S/P tonsillectomy   . Seasonal allergies   . Type II or unspecified type diabetes mellitus without mention of complication, uncontrolled    12-29-14 Told he was prediabetic once    Social History  Substance Use Topics  . Smoking status: Former Smoker    Quit date: 07/24/1964  . Smokeless tobacco: Never Used     Comment: Quit 20-30 years ago  . Alcohol use 25.2 oz/week    7 Glasses of wine, 28 Cans of beer, 7 Shots of liquor per week     Comment: occasional    Family History  Problem Relation Age of Onset  . Cancer Mother     lung  . Thyroid disease Mother   . Diabetes Father   . GI Bleed Father   . Aneurysm Father   . AAA (abdominal aortic aneurysm) Father   . Colon cancer Neg Hx   . Colon polyps Neg Hx   . Rectal cancer Neg Hx   . Stomach cancer Neg Hx     Allergies  Allergen  Reactions  . Peanut-Containing Drug Products Hives  . Penicillins Hives    Has patient had a PCN reaction causing immediate rash, facial/tongue/throat swelling, SOB or lightheadedness with hypotension: No Has patient had a PCN reaction causing severe rash involving mucus membranes or skin necrosis: No Has patient had a PCN reaction that required hospitalization No Has patient had a PCN reaction occurring within the last 10 years: No If all of the above answers are "NO", then may proceed with Cephalosporin use.     Current Outpatient Prescriptions:  .  b complex vitamins tablet, Take 1 tablet by mouth daily., Disp: , Rfl:  .  clobetasol cream (TEMOVATE) AB-123456789 %, Apply 1 application topically as needed., Disp: 30 g, Rfl: 11 .  Cyanocobalamin (VITAMIN B-12 PO), Take 1 tablet by mouth daily., Disp: , Rfl:  .  diazepam (VALIUM) 5 MG tablet, Take 1 tablet (5 mg total) by mouth every 8 (eight) hours as needed., Disp: 30 tablet, Rfl: 0 .  fexofenadine (ALLEGRA) 180 MG tablet, Take 180 mg by mouth daily., Disp: , Rfl:  .  fluticasone (FLONASE) 50 MCG/ACT nasal spray, Place 2 sprays into both  nostrils daily as needed for allergies or rhinitis., Disp: , Rfl:  .  meclizine (ANTIVERT) 25 MG tablet, Take 1 tablet (25 mg total) by mouth 3 (three) times daily as needed for dizziness., Disp: 30 tablet, Rfl: 0 .  triamcinolone ointment (KENALOG) 0.5 %, Apply 1 application topically 2 (two) times daily., Disp: 30 g, Rfl: 0 .  vitamin C (ASCORBIC ACID) 500 MG tablet, Take 500 mg by mouth daily., Disp: , Rfl:  .  aspirin 81 MG chewable tablet, Chew 81 mg by mouth daily., Disp: , Rfl:  .  atorvastatin (LIPITOR) 20 MG tablet, Take 1 tablet (20 mg total) by mouth daily at 6 PM. (Patient not taking: Reported on 07/18/2016), Disp: 30 tablet, Rfl: 3 .  triamterene-hydrochlorothiazide (DYAZIDE) 37.5-25 MG capsule, Take 1 capsule by mouth every morning., Disp: , Rfl: 5  Current Facility-Administered Medications:  .  0.9  %  sodium chloride infusion, 500 mL, Intravenous, Continuous, Irene Shipper, MD  Vitals:   07/18/16 1054  BP: 131/85  Pulse: (!) 56  Resp: 20  Temp: 97 F (36.1 C)  TempSrc: Oral  SpO2: 95%  Weight: 224 lb 6.4 oz (101.8 kg)  Height: 5\' 10"  (1.778 m)    Body mass index is 32.2 kg/m.         Review of Systems Denies chest pain, dyspnea on exertion, PND, orthopnea, hemoptysis, claudication. Does have a history of supraventricular tachycardia which was treated by ablation therapy successfully. Does not take blood thinners other than aspirin.    Objective:   Physical Exam BP 131/85 (BP Location: Left Arm, Patient Position: Sitting, Cuff Size: Normal)   Pulse (!) 56   Temp 97 F (36.1 C) (Oral)   Resp 20   Ht 5\' 10"  (1.778 m)   Wt 224 lb 6.4 oz (101.8 kg)   SpO2 95%   BMI 32.20 kg/m     Gen.-alert and oriented x3 in no apparent distress HEENT normal for age Lungs no rhonchi or wheezing Cardiovascular regular rhythm no murmurs carotid pulses 3+ palpable no bruits audible Abdomen soft nontender no palpable masses Musculoskeletal free of  major deformities Skin clear -no rashes Neurologic normal Lower extremities 3+ femoral and dorsalis pedis pulses palpable bilaterally with no edema on the left 1+ edema on the right Bleeding site has eschar overlying it about 3 mm in diameter posterior to right medial malleolus. Bulging varicosities and medial calf and medial thigh over great saphenous system.  Today I ordered a venous duplex exam the right leg which I reviewed and interpreted. The patient does have gross reflux throughout the right great saphenous vein supplying the painful varicosities in the area where the bleeding occurred. He also has no evidence of DVT and does have some incompetence in the deep system. The small saphenous vein has valvular incompetence but is small in caliber      Assessment:     Painful varicosities right leg with history of bleeding varix  right ankle 2 due to gross reflux right great saphenous vein  he is quite concerned about further episodes of bleeding and other trips to the emergency department  Plan:         #1 long leg elastic compression stockings 20-30 mm gradient #2 elevate legs as much as possible #3 ibuprofen daily on a regular basis for pain #4 return in 3 months-if no significant improvement then he will require laser ablation right great saphenous vein plus sclerotherapy of the bleeding site with possible return to  evaluate for stab phlebectomy Return in 3 months If patient has further episodes of bleeding he will be in touch with [place] to believe the three-month waiting. Because of the bleeding episodes

## 2016-07-19 DIAGNOSIS — I868 Varicose veins of other specified sites: Secondary | ICD-10-CM

## 2016-07-21 ENCOUNTER — Ambulatory Visit: Payer: Medicare Other | Admitting: Adult Health

## 2016-08-01 ENCOUNTER — Other Ambulatory Visit (HOSPITAL_COMMUNITY): Payer: Medicare Other

## 2016-08-09 ENCOUNTER — Other Ambulatory Visit (HOSPITAL_COMMUNITY): Payer: Medicare Other

## 2016-08-10 ENCOUNTER — Encounter: Payer: Medicare Other | Admitting: Cardiothoracic Surgery

## 2016-08-10 ENCOUNTER — Other Ambulatory Visit: Payer: Medicare Other

## 2016-08-11 DIAGNOSIS — Z961 Presence of intraocular lens: Secondary | ICD-10-CM | POA: Diagnosis not present

## 2016-08-11 LAB — HM DIABETES EYE EXAM

## 2016-08-16 ENCOUNTER — Encounter: Payer: Self-pay | Admitting: Adult Health

## 2016-08-18 ENCOUNTER — Emergency Department (HOSPITAL_COMMUNITY)
Admission: EM | Admit: 2016-08-18 | Discharge: 2016-08-18 | Disposition: A | Discharge status: Home / Self Care | Payer: Medicare Other | Attending: Emergency Medicine | Admitting: Emergency Medicine

## 2016-08-18 ENCOUNTER — Encounter (HOSPITAL_COMMUNITY): Payer: Self-pay | Admitting: *Deleted

## 2016-08-18 ENCOUNTER — Encounter (HOSPITAL_COMMUNITY)
Admission: EM | Disposition: A | Discharge status: Home / Self Care | Payer: Self-pay | Source: Home / Self Care | Attending: Emergency Medicine

## 2016-08-18 ENCOUNTER — Emergency Department (HOSPITAL_COMMUNITY): Payer: Medicare Other

## 2016-08-18 DIAGNOSIS — Z87891 Personal history of nicotine dependence: Secondary | ICD-10-CM | POA: Diagnosis not present

## 2016-08-18 DIAGNOSIS — R1314 Dysphagia, pharyngoesophageal phase: Secondary | ICD-10-CM | POA: Diagnosis not present

## 2016-08-18 DIAGNOSIS — Z801 Family history of malignant neoplasm of trachea, bronchus and lung: Secondary | ICD-10-CM | POA: Diagnosis not present

## 2016-08-18 DIAGNOSIS — K210 Gastro-esophageal reflux disease with esophagitis: Secondary | ICD-10-CM | POA: Insufficient documentation

## 2016-08-18 DIAGNOSIS — I10 Essential (primary) hypertension: Secondary | ICD-10-CM | POA: Insufficient documentation

## 2016-08-18 DIAGNOSIS — E669 Obesity, unspecified: Secondary | ICD-10-CM | POA: Insufficient documentation

## 2016-08-18 DIAGNOSIS — E785 Hyperlipidemia, unspecified: Secondary | ICD-10-CM | POA: Insufficient documentation

## 2016-08-18 DIAGNOSIS — Z79899 Other long term (current) drug therapy: Secondary | ICD-10-CM | POA: Diagnosis not present

## 2016-08-18 DIAGNOSIS — Z880 Allergy status to penicillin: Secondary | ICD-10-CM | POA: Diagnosis not present

## 2016-08-18 DIAGNOSIS — R1110 Vomiting, unspecified: Secondary | ICD-10-CM | POA: Insufficient documentation

## 2016-08-18 DIAGNOSIS — Z91010 Allergy to peanuts: Secondary | ICD-10-CM | POA: Diagnosis not present

## 2016-08-18 DIAGNOSIS — E1165 Type 2 diabetes mellitus with hyperglycemia: Secondary | ICD-10-CM | POA: Diagnosis not present

## 2016-08-18 DIAGNOSIS — R05 Cough: Secondary | ICD-10-CM | POA: Diagnosis not present

## 2016-08-18 DIAGNOSIS — Z7982 Long term (current) use of aspirin: Secondary | ICD-10-CM | POA: Insufficient documentation

## 2016-08-18 DIAGNOSIS — Z6832 Body mass index (BMI) 32.0-32.9, adult: Secondary | ICD-10-CM | POA: Insufficient documentation

## 2016-08-18 DIAGNOSIS — T18108A Unspecified foreign body in esophagus causing other injury, initial encounter: Secondary | ICD-10-CM

## 2016-08-18 DIAGNOSIS — K222 Esophageal obstruction: Secondary | ICD-10-CM | POA: Insufficient documentation

## 2016-08-18 DIAGNOSIS — R1310 Dysphagia, unspecified: Secondary | ICD-10-CM | POA: Diagnosis present

## 2016-08-18 HISTORY — PX: ESOPHAGOGASTRODUODENOSCOPY: SHX5428

## 2016-08-18 LAB — I-STAT CHEM 8, ED
BUN: 19 mg/dL (ref 6–20)
CALCIUM ION: 1.18 mmol/L (ref 1.15–1.40)
Chloride: 105 mmol/L (ref 101–111)
Creatinine, Ser: 1.2 mg/dL (ref 0.61–1.24)
Glucose, Bld: 93 mg/dL (ref 65–99)
HEMATOCRIT: 40 % (ref 39.0–52.0)
HEMOGLOBIN: 13.6 g/dL (ref 13.0–17.0)
Potassium: 4 mmol/L (ref 3.5–5.1)
SODIUM: 142 mmol/L (ref 135–145)
TCO2: 26 mmol/L (ref 0–100)

## 2016-08-18 SURGERY — EGD (ESOPHAGOGASTRODUODENOSCOPY)
Anesthesia: Moderate Sedation

## 2016-08-18 MED ORDER — BUTAMBEN-TETRACAINE-BENZOCAINE 2-2-14 % EX AERO
INHALATION_SPRAY | CUTANEOUS | Status: AC
  Filled 2016-08-18: qty 20

## 2016-08-18 MED ORDER — MIDAZOLAM HCL 5 MG/5ML IJ SOLN
INTRAMUSCULAR | Status: DC | PRN
  Administered 2016-08-18 (×2): 2 mg via INTRAVENOUS

## 2016-08-18 MED ORDER — ONDANSETRON HCL 4 MG/2ML IJ SOLN
INTRAMUSCULAR | Status: AC
  Filled 2016-08-18: qty 2

## 2016-08-18 MED ORDER — GLUCAGON HCL RDNA (DIAGNOSTIC) 1 MG IJ SOLR
1.0000 mg | Freq: Once | INTRAMUSCULAR | Status: AC
  Administered 2016-08-18: 1 mg via INTRAVENOUS
  Filled 2016-08-18: qty 1

## 2016-08-18 MED ORDER — LIDOCAINE VISCOUS 2 % MT SOLN
OROMUCOSAL | Status: AC
  Filled 2016-08-18: qty 15

## 2016-08-18 MED ORDER — MEPERIDINE HCL 100 MG/ML IJ SOLN
INTRAMUSCULAR | Status: AC
  Filled 2016-08-18: qty 2

## 2016-08-18 MED ORDER — SODIUM CHLORIDE 0.9 % IV BOLUS (SEPSIS)
1000.0000 mL | Freq: Once | INTRAVENOUS | Status: AC
  Administered 2016-08-18: 1000 mL via INTRAVENOUS

## 2016-08-18 MED ORDER — ONDANSETRON HCL 4 MG/2ML IJ SOLN
INTRAMUSCULAR | Status: DC | PRN
  Administered 2016-08-18: 4 mg via INTRAVENOUS

## 2016-08-18 MED ORDER — MEPERIDINE HCL 100 MG/ML IJ SOLN
INTRAMUSCULAR | Status: DC | PRN
Start: 2016-08-18 — End: 2016-08-18
  Administered 2016-08-18 (×2): 25 mg via INTRAVENOUS

## 2016-08-18 MED ORDER — BUTAMBEN-TETRACAINE-BENZOCAINE 2-2-14 % EX AERO
INHALATION_SPRAY | CUTANEOUS | Status: DC | PRN
  Administered 2016-08-18: 1 via TOPICAL

## 2016-08-18 MED ORDER — MIDAZOLAM HCL 5 MG/5ML IJ SOLN
INTRAMUSCULAR | Status: AC
  Filled 2016-08-18: qty 10

## 2016-08-18 NOTE — H&P (Signed)
@LOGO @   Primary Care Physician:  Dorothyann Peng, NP Primary Gastroenterologist:  Dr. Gala Romney  Pre-Procedure History & Physical: HPI:  Daniel Guerrero is a 71 y.o. male here for probable food impaction. Has had trouble with esophageal dysphagia over the past several days. Swallowed some earlier today and felt it "hang" behind his breast bone. Not able to even swallow saliva. Saw Dr. Roderic Palau in the ED. Glucagon was not effective. Dr. Kendal Hymen called me. Patient has long-standing reflux  -  takes lansoprazole 15 mg "as needed". History of esophageal dilation years ago in [place]. He was told he had a "rough looking ring"  He sees a gastroenterologist in [place]. Colonoscopy not too long reportedly yielded 6 polyps.  Past Medical History:  Diagnosis Date  . Allergy   . Cataract of both eyes    implants both eyes   . Eczema   . Enlarged aorta ([place])   . Erectile dysfunction   . Hearing loss    high frequency  . History of inguinal hernia repair, bilateral   . Hypertension   . Kidney stones   . Loss of hearing    high frequency  . Meniere disease   . Obesity   . Paroxysmal SVT (supraventricular tachycardia) ([place])   . Psoriasis   . S/P appendectomy   . S/P tonsillectomy   . Seasonal allergies   . Type II or unspecified type diabetes mellitus without mention of complication, uncontrolled    12-29-14 Told he was prediabetic once    Past Surgical History:  Procedure Laterality Date  . Ablasion    . APPENDECTOMY    . COLONOSCOPY     unsure where or when- states greater than 10 yrs ago per pt- was normal per pt and his wife   . CYSTOSCOPY WITH RETROGRADE PYELOGRAM, URETEROSCOPY AND STENT PLACEMENT Left 12/30/2014   Procedure: CYSTOSCOPY WITH LEFT URETEROSCOPY STONE EXTRACTION WITH STENT;  Surgeon: Franchot Gallo, MD;  [facility];  Service: Urology;  Laterality: Left;  . ELECTROPHYSIOLOGIC STUDY N/A 08/24/2015   Procedure: SVT Ablation;  Surgeon: Thompson Grayer, MD;  Location: [place] [facility];  Service: Cardiovascular;  Laterality: N/A;  . EYE SURGERY     Catracts removed both eye 30 yrs ago  . HERNIA REPAIR     right and left  . HOLMIUM LASER APPLICATION Left [id number]   Procedure: HOLMIUM LASER APPLICATION;  Surgeon: Franchot Gallo, MD;  Location[facility];  Service: Urology;  Laterality: Left;  . TONSILLECTOMY      Prior to Admission medications   Medication Sig Start Date End Date Taking? Authorizing Provider  aspirin 81 MG chewable tablet Chew 81 mg by mouth daily.   Yes Historical Provider, MD  b complex vitamins tablet Take 1 tablet by mouth daily.   Yes Historical Provider, MD  Cyanocobalamin (VITAMIN B-12 PO) Take 1 tablet by mouth daily.   Yes Historical Provider, MD  fluticasone (FLONASE) 50 MCG/ACT nasal spray Place 2 sprays into both nostrils daily as needed for allergies or rhinitis.   Yes Historical Provider, MD  lansoprazole (PREVACID) 15 MG capsule Take 15 mg by mouth daily as needed (for indigestion).   Yes Historical Provider, MD  loratadine (CLARITIN) 10 MG tablet Take 10 mg by mouth daily as needed for allergies.   Yes Historical Provider, MD  triamcinolone ointment (KENALOG) 0.5 % Apply 1 application topically 2 (two) times daily. 04/14/16  Yes Dorothyann Peng, NP  vitamin C (ASCORBIC ACID) 500 MG tablet Take  500 mg by mouth daily.   Yes Historical Provider, MD  atorvastatin (LIPITOR) 20 MG tablet Take 1 tablet (20 mg total) by mouth daily at 6 PM. Patient not taking: Reported on 07/18/2016 06/11/15   Erline Hau, MD    Allergies as of 08/18/2016 - Review Complete 08/18/2016  Allergen Reaction Noted  . Peanut-containing drug products Hives 12/31/2009  . Penicillins Hives 03/11/2008    Family History  Problem Relation Age of Onset  . Cancer Mother     lung  . Thyroid disease Mother   . Diabetes Father   . GI Bleed Father   . Aneurysm Father   . AAA (abdominal aortic aneurysm) Father   . Colon cancer Neg Hx   . Colon  polyps Neg Hx   . Rectal cancer Neg Hx   . Stomach cancer Neg Hx     Social History   Social History  . Marital status: Married    Spouse name: N/A  . Number of children: N/A  . Years of education: N/A   Occupational History  . Not on file.   Social History Main Topics  . Smoking status: Former Smoker    Quit date: 07/24/1964  . Smokeless tobacco: Never Used     Comment: Quit 20-30 years ago  . Alcohol use 25.2 oz/week    7 Glasses of wine, 28 Cans of beer, 7 Shots of liquor per week     Comment: occasional  . Drug use: No  . Sexual activity: Not on file   Other Topics Concern  . Not on file   Social History Narrative   Daily Caffeine use: 5-6 drinks daily   Exercising 2 times per week   No Drug use             Review of Systems: See HPI, otherwise negative ROS  Physical Exam: BP 159/80   Pulse 73   Temp 98.9 F (37.2 C) (Temporal)   Resp 20   Wt 224 lb (101.6 kg)   SpO2 96%   BMI 32.14 kg/m  General:   Alert,  Well-developed, well-nourished, pleasant and cooperative in NAD Mouth:  No deformity or lesions. Neck:  Supple; no masses or thyromegaly. No significant cervical adenopathy. Lungs:  Clear throughout to auscultation.   No wheezes, crackles, or rhonchi. No acute distress. Heart:  Regular rate and rhythm; no murmurs, clicks, rubs,  or gallops. Abdomen: Non-distended, normal bowel sounds.  Soft and nontender without appreciable mass or hepatosplenomegaly.  Pulses:  Normal pulses noted. Extremities:  Without clubbing or edema.  Impression:  Pleasant 71 year old gentleman with GERD history of esophageal dysphagia possibly a ring requiring dilation previously presents to the ED this evening with the clinical picture consistent with an esophageal food impaction.  Urgent EGD warranted. I explained to the patient that if he has marked inflammation and ongoing impaction with food debris in his GI tract, our goal this evening would be to disimpact and not  dilate. Dilation would be best performed at another time in an elective setting.The risks, benefits, limitations, alternatives and imponderables have been reviewed with the patient. Potential for esophageal dilation, biopsy, etc. have also been reviewed.  Questions have been answered. All parties agreeable.         Notice: This dictation was prepared with Dragon dictation along with smaller phrase technology. Any transcriptional errors that result from this process are unintentional and may not be corrected upon review.

## 2016-08-18 NOTE — ED Notes (Signed)
Patient states he started having "a little trouble" swallowing yesterday. States he ate sauerkraut, pork chop, and tofu today for lunch and immediately after felt like he had food stuck in his throat and chest. States he has been unable to swallow any liquid since lunch. States "it comes right back up when I try to swallow water." States "right before I came back here to the room, I vomited a lot and it had some food in it and now I feel relief. I don't feel like it is stuck anymore." Patient had moderate amount of clear liquid with small particles of food noted in emesis bag. No drooling noted, no muffled voice at this time. Denies pain since vomiting in ER waiting area. NAD noted at this time.

## 2016-08-18 NOTE — ED Notes (Signed)
Denies any oral airway swelling.

## 2016-08-18 NOTE — Discharge Instructions (Signed)
EGD Discharge instructions Please read the instructions outlined below and refer to this sheet in the next few weeks. These discharge instructions provide you with general information on caring for yourself after you leave the hospital. Your doctor may also give you specific instructions. While your treatment has been planned according to the most current medical practices available, unavoidable complications occasionally occur. If you have any problems or questions after discharge, please call your doctor. ACTIVITY  You may resume your regular activity but move at a slower pace for the next 24 hours.   Take frequent rest periods for the next 24 hours.   Walking will help expel (get rid of) the air and reduce the bloated feeling in your abdomen.   No driving for 24 hours (because of the anesthesia (medicine) used during the test).   You may shower.   Do not sign any important legal documents or operate any machinery for 24 hours (because of the anesthesia used during the test).  NUTRITION  Drink plenty of fluids.   You may resume your normal diet.   Begin with a light meal and progress to your normal diet.   Avoid alcoholic beverages for 24 hours or as instructed by your caregiver.  MEDICATIONS  You may resume your normal medications unless your caregiver tells you otherwise.  WHAT YOU CAN EXPECT TODAY  You may experience abdominal discomfort such as a feeling of fullness or gas pains.  FOLLOW-UP  Your doctor will discuss the results of your test with you.  SEEK IMMEDIATE MEDICAL ATTENTION IF ANY OF THE FOLLOWING OCCUR:  Excessive nausea (feeling sick to your stomach) and/or vomiting.   Severe abdominal pain and distention (swelling).   Trouble swallowing.   Temperature over 101 F (37.8 C).   Rectal bleeding or vomiting of blood.    GERD information provided  Dysphagia 3 diet for now  With dentures, need to pay extra attention to chewing food thoroughly before  swallowing  Increase Prevacid (lansoprazole) to 30 mg daily. Should take this medication every day.  My office will call you the first of next week to arrange an elective upper endoscopy with esophageal dilation.

## 2016-08-18 NOTE — ED Triage Notes (Signed)
Pt has had trouble swallowing since yesterday. States he had trouble swallowing soup. Pt states he has some pain when swallowing. Pt can swallow food then it comes back up.

## 2016-08-18 NOTE — Op Note (Signed)
[facility] Patient Name: Daniel Guerrero Procedure Date: 08/18/2016 8:18 PM MRN: [id number] Date of Birth: [birth year] Attending MD: Norvel Richards , MD CSN: [id number] Age: 71 Admit Type: Outpatient Procedure:                Upper GI endoscopy - Emergent/food impaction Indications:              Dysphagia, Heartburn Providers:                Norvel Richards, MD, Janeece Riggers, RN, Isabella Stalling, Technician Referring MD:             Dorothyann Peng Medicines:                Midazolam 4 mg IV, Meperidine 50 mg IV Complications:            No immediate complications. Estimated Blood Loss:     Estimated blood loss: none. Procedure:                Pre-Anesthesia Assessment:                           - Prior to the procedure, a History and Physical                            was performed, and patient medications and                            allergies were reviewed. The patient's tolerance of                            previous anesthesia was also reviewed. The risks                            and benefits of the procedure and the sedation                            options and risks were discussed with the patient.                            All questions were answered, and informed consent                            was obtained. Prior Anticoagulants: The patient has                            taken no previous anticoagulant or antiplatelet                            agents. ASA Grade Assessment: II - A patient with                            mild systemic disease. After reviewing the risks  and benefits, the patient was deemed in                            satisfactory condition to undergo the procedure.                           After obtaining informed consent, the endoscope was                            passed under direct vision. Throughout the                            procedure, the patient's blood pressure, pulse,  and                            oxygen saturations were monitored continuously. The                            [id number]) scope was introduced through the                            mouth, and advanced to the gastric cardia. The                            upper GI endoscopy was accomplished without                            difficulty. The patient tolerated the procedure                            well. Scope In: 8:47:51 PM Scope Out: 8:49:49 PM Scope Withdrawal Time: 0 hours 0 minutes 2 seconds  Total Procedure Duration: 0 hours 1 minute 58 seconds  Findings:      LA Grade A (one or more mucosal breaks less than 5 mm, not extending       between tops of 2 mucosal folds) esophagitis was found 35 to 37 cm from       the incisors. Schatzki's ring. No tumor seen. Patient heaved once with       scope passage into the esophagus. Retained gastric contents in the way       of meat bolus and fluid seen in the cardia. Complete examination of the       stomach not carried out. No biopsy or dilation performed per plan. Impression:               - LA Grade A esophagitis. Schatzki's ring. Food                            impaction. Incomplete examination as described. No                            dilation. Disimpacted                           - No specimens collected. Moderate Sedation:      Moderate (conscious) sedation  was administered by the endoscopy nurse       and supervised by the endoscopist. The following parameters were       monitored: oxygen saturation, heart rate, blood pressure, respiratory       rate, EKG, adequacy of pulmonary ventilation, and response to care.       Total physician intraservice time was 9 minutes. Recommendation:           - Patient has a contact number available for                            emergencies. The signs and symptoms of potential                            delayed complications were discussed with the                            patient. Return  to normal activities tomorrow.                            Written discharge instructions were provided to the                            patient.                           - Dysphagia 3 diet.                           - Use Prevacid (lansoprazole) 30 mg PO daily.                            Return in the next 1-2 weeks for an elective EGD                            and esophageal dilation as feasible/appropriate.                            Swallowing precautions reviewed particularly with                            dentures                           - Patient has a contact number available for                            emergencies. The signs and symptoms of potential                            delayed complications were discussed with the                            patient. Return to normal activities tomorrow.  Written discharge instructions were provided to the                            patient.                           - Continue present medications otherwise. Procedure Code(s):        --- Professional ---                           [id number], Esophagoscopy, flexible, transoral;                            diagnostic, including collection of specimen(s) by                            brushing or washing, when performed (separate                            procedure) Diagnosis Code(s):        --- Professional ---                           K20.9, Esophagitis, unspecified                           R13.10, Dysphagia, unspecified                           R12, Heartburn CPT copyright 2016 [employer]. All rights reserved. The codes documented in this report are preliminary and upon coder review may  be revised to meet current compliance requirements. Cristopher Estimable. Anureet Bruington, MD Norvel Richards, MD 08/18/2016 9:06:30 PM This report has been signed electronically. Number of Addenda: 0

## 2016-08-18 NOTE — ED Provider Notes (Signed)
[place] [facility] DEPT Provider Note   CSN: [id number] Arrival date & time: 08/18/16  1739     History   Chief Complaint Chief Complaint  Patient presents with  . Dysphagia    HPI Daniel Guerrero is a 71 y.o. male.  Patient states that since about 1:00 he has not been able to swallow his own saliva. He started having problems swallowing couple days ago. He had his esophagus dilated 5 years ago 1 time    Emesis   This is a new problem. The current episode started 6 to 12 hours ago. The problem occurs continuously. The problem has not changed since onset.Emesis appearance: Saliva. Pertinent negatives include no abdominal pain, no chills, no cough, no diarrhea and no headaches.    Past Medical History:  Diagnosis Date  . Allergy   . Cataract of both eyes    implants both eyes   . Eczema   . Enlarged aorta ([place])   . Erectile dysfunction   . Hearing loss    high frequency  . History of inguinal hernia repair, bilateral   . Hypertension   . Kidney stones   . Loss of hearing    high frequency  . Meniere disease   . Obesity   . Paroxysmal SVT (supraventricular tachycardia) ([place])   . Psoriasis   . S/P appendectomy   . S/P tonsillectomy   . Seasonal allergies   . Type II or unspecified type diabetes mellitus without mention of complication, uncontrolled    12-29-14 Told he was prediabetic once    Patient Active Problem List   Diagnosis Date Noted  . Hyperglycemia 04/14/2016  . Hyperlipidemia 04/14/2016  . Sinus bradycardia 06/10/2015  . Troponin level elevated   . Asthma with acute exacerbation 11/09/2014  . Benign paroxysmal positional vertigo 04/16/2014  . LLQ pain 01/15/2014  . Hypertension   . Eczema   . Erectile dysfunction   . History of inguinal hernia repair, bilateral   . Paroxysmal SVT (supraventricular tachycardia) ([place]) 11/11/2012  . Headache, common migraine, intractable 09/09/2010  . DYSPHAGIA 08/04/2010  . Allergic rhinitis 08/01/2010  . Overweight  12/13/2007  . MIXED HEARING LOSS BILATERAL 12/13/2007  . CONTACT DERMATITIS&OTH ECZEMA DUE OTH [place] AGENT 12/13/2007  . PSORIASIS 12/13/2007    Past Surgical History:  Procedure Laterality Date  . Ablasion    . APPENDECTOMY    . COLONOSCOPY     unsure where or when- states greater than 10 yrs ago per pt- was normal per pt and his wife   . CYSTOSCOPY WITH RETROGRADE PYELOGRAM, URETEROSCOPY AND STENT PLACEMENT Left 12/30/2014   Procedure: CYSTOSCOPY WITH LEFT URETEROSCOPY STONE EXTRACTION WITH STENT;  Surgeon: Franchot Gallo, MD;  Location: [facility];  Service: Urology;  Laterality: Left;  . ELECTROPHYSIOLOGIC STUDY N/A 08/24/2015   Procedure: SVT Ablation;  Surgeon: Thompson Grayer, MD;  Location: [place] [facility];  Service: Cardiovascular;  Laterality: N/A;  . EYE SURGERY     [place] removed both eye 30 yrs ago  . HERNIA REPAIR     right and left  . HOLMIUM LASER APPLICATION Left [id number]   Procedure: HOLMIUM LASER APPLICATION;  Surgeon: Franchot Gallo, MD;  Location: [facility];  Service: Urology;  Laterality: Left;  . TONSILLECTOMY         Home Medications    Prior to Admission medications   Medication Sig Start Date End Date Taking? Authorizing Provider  aspirin 81 MG chewable tablet Chew 81 mg by mouth daily.  Historical Provider, MD  atorvastatin (LIPITOR) 20 MG tablet Take 1 tablet (20 mg total) by mouth daily at 6 PM. Patient not taking: Reported on 07/18/2016 06/11/15   Erline Hau, MD  b complex vitamins tablet Take 1 tablet by mouth daily.    Historical Provider, MD  clobetasol cream (TEMOVATE) [id number] % Apply 1 application topically as needed. 04/14/16   Dorothyann Peng, NP  Cyanocobalamin (VITAMIN B-12 PO) Take 1 tablet by mouth daily.    Historical Provider, MD  diazepam (VALIUM) 5 MG tablet Take 1 tablet (5 mg total) by mouth every 8 (eight) hours as needed. 04/14/16   Dorothyann Peng, NP  fexofenadine (ALLEGRA) 180 MG tablet Take 180 mg by mouth daily.     Historical Provider, MD  fluticasone (FLONASE) 50 MCG/ACT nasal spray Place 2 sprays into both nostrils daily as needed for allergies or rhinitis.    Historical Provider, MD  meclizine (ANTIVERT) 25 MG tablet Take 1 tablet (25 mg total) by mouth 3 (three) times daily as needed for dizziness. 09/14/15   Lucretia Kern, DO  triamcinolone ointment (KENALOG) 0.5 % Apply 1 application topically 2 (two) times daily. 04/14/16   Dorothyann Peng, NP  triamterene-hydrochlorothiazide (DYAZIDE) 37.5-25 MG capsule Take 1 capsule by mouth every morning. 09/22/15   Historical Provider, MD  vitamin C (ASCORBIC ACID) 500 MG tablet Take 500 mg by mouth daily.    Historical Provider, MD    Family History Family History  Problem Relation Age of Onset  . Cancer Mother     lung  . Thyroid disease Mother   . Diabetes Father   . GI Bleed Father   . Aneurysm Father   . AAA (abdominal aortic aneurysm) Father   . Colon cancer Neg Hx   . Colon polyps Neg Hx   . Rectal cancer Neg Hx   . Stomach cancer Neg Hx     Social History Social History  Substance Use Topics  . Smoking status: Former Smoker    Quit date: 07/24/1964  . Smokeless tobacco: Never Used     Comment: Quit 20-30 years ago  . Alcohol use 25.2 oz/week    7 Glasses of wine, 28 Cans of beer, 7 Shots of liquor per week     Comment: occasional     Allergies   Peanut-containing drug products and Penicillins   Review of Systems Review of Systems  Constitutional: Negative for appetite change, chills and fatigue.  HENT: Negative for congestion, ear discharge and sinus pressure.   Eyes: Negative for discharge.  Respiratory: Negative for cough.   Cardiovascular: Negative for chest pain.  Gastrointestinal: Positive for vomiting. Negative for abdominal pain and diarrhea.  Genitourinary: Negative for frequency and hematuria.  Musculoskeletal: Negative for back pain.  Skin: Negative for rash.  Neurological: Negative for seizures and headaches.    Psychiatric/Behavioral: Negative for hallucinations.     Physical Exam Updated Vital Signs BP 167/93 (BP Location: Left Arm)   Pulse 70   Temp 98.9 F (37.2 C) (Temporal)   Resp 20   Wt 224 lb (101.6 kg)   SpO2 97%   BMI 32.14 kg/m   Physical Exam  Constitutional: He is oriented to person, place, and time. He appears well-developed.  HENT:  Head: Normocephalic.  Eyes: Conjunctivae and EOM are normal. No scleral icterus.  Neck: Neck supple. No thyromegaly present.  Cardiovascular: Normal rate and regular rhythm.  Exam reveals no gallop and no friction rub.   No murmur heard.  Pulmonary/Chest: No stridor. He has no wheezes. He has no rales. He exhibits no tenderness.  Abdominal: He exhibits no distension. There is no tenderness. There is no rebound.  Musculoskeletal: Normal range of motion. He exhibits no edema.  Lymphadenopathy:    He has no cervical adenopathy.  Neurological: He is oriented to person, place, and time. He exhibits normal muscle tone. Coordination normal.  Skin: No rash noted. No erythema.  Psychiatric: He has a normal mood and affect. His behavior is normal.     ED Treatments / Results  Labs (all labs ordered are listed, but only abnormal results are displayed) Labs Reviewed  I-STAT CHEM 8, ED    EKG  EKG Interpretation  Date/Time:  Friday August 18 2016 18:50:41 EST Ventricular Rate:  70 PR Interval:    QRS Duration: 113 QT Interval:  406 QTC Calculation: 439 R Axis:   -3 Text Interpretation:  Sinus rhythm Ventricular premature complex Prolonged PR interval Borderline intraventricular conduction delay Baseline wander in lead(s) V4 V6 Confirmed by Mung Rinker  MD, Hyla Coard [phone]) on 08/18/2016 7:28:43 PM       Radiology Dg Chest 2 View  Result Date: 08/18/2016 CLINICAL DATA:  Trouble swallowing since yesterday.  Cough. EXAM: CHEST  2 VIEW COMPARISON:  02/24/2015 and 06/24/2015 as well as chest CT 07/15/2015 FINDINGS: Lungs are adequately inflated  without focal consolidation or effusion. Mild linear scarring right base. Cardiomediastinal silhouette is within normal. There is tortuosity mild calcification of the thoracic aorta. Mild degenerate change of the spine. IMPRESSION: No acute cardiopulmonary disease. Electronically Signed   By: Marin Olp M.D.   On: 08/18/2016 18:54    Procedures Procedures (including critical care time)  Medications Ordered in ED Medications  sodium chloride 0.9 % bolus 1,000 mL (1,000 mLs Intravenous New Bag/Given 08/18/16 1830)  glucagon (human recombinant) (GLUCAGEN) injection 1 mg (1 mg Intravenous Given 08/18/16 1831)     Initial Impression / Assessment and Plan / ED Course  I have reviewed the triage vital signs and the nursing notes.  Pertinent labs & imaging results that were available during my care of the patient were reviewed by me and considered in my medical decision making (see chart for details).    Patient with foreign body impaction in esophagus. Patient was given glucagon without any help. He still could not swallow his own saliva. The GI doctor is coming to see the patient and do endoscopy  Final Clinical Impressions(s) / ED Diagnoses   Final diagnoses:  Foreign body in esophagus, initial encounter    New Prescriptions New Prescriptions   No medications on file     [name], MD 08/18/16 1931

## 2016-08-22 ENCOUNTER — Other Ambulatory Visit: Payer: Self-pay

## 2016-08-22 DIAGNOSIS — R1310 Dysphagia, unspecified: Secondary | ICD-10-CM

## 2016-08-24 ENCOUNTER — Telehealth: Payer: Self-pay

## 2016-08-24 NOTE — Telephone Encounter (Signed)
Tried to call pt. Talked to his wife. Moved EGD/ED with RMR to 09/06/16 at 2:15 pm, arrive at 1:15 pm. New instructions mailed.

## 2016-08-28 ENCOUNTER — Other Ambulatory Visit: Payer: Self-pay

## 2016-08-28 ENCOUNTER — Ambulatory Visit (HOSPITAL_COMMUNITY): Payer: Medicare Other | Attending: Cardiology

## 2016-08-28 DIAGNOSIS — I712 Thoracic aortic aneurysm, without rupture, unspecified: Secondary | ICD-10-CM

## 2016-08-28 DIAGNOSIS — I080 Rheumatic disorders of both mitral and aortic valves: Secondary | ICD-10-CM | POA: Insufficient documentation

## 2016-08-31 ENCOUNTER — Encounter: Payer: Self-pay | Admitting: Cardiothoracic Surgery

## 2016-08-31 ENCOUNTER — Ambulatory Visit (INDEPENDENT_AMBULATORY_CARE_PROVIDER_SITE_OTHER): Payer: Medicare Other | Admitting: Cardiothoracic Surgery

## 2016-08-31 ENCOUNTER — Ambulatory Visit
Admission: RE | Admit: 2016-08-31 | Discharge: 2016-08-31 | Disposition: A | Discharge status: Home / Self Care | Payer: Medicare Other | Source: Ambulatory Visit | Attending: Cardiothoracic Surgery | Admitting: Cardiothoracic Surgery

## 2016-08-31 VITALS — BP 155/90 | HR 60 | Resp 20 | Ht 70.0 in | Wt 220.0 lb

## 2016-08-31 DIAGNOSIS — I77810 Thoracic aortic ectasia: Secondary | ICD-10-CM | POA: Diagnosis not present

## 2016-08-31 DIAGNOSIS — I712 Thoracic aortic aneurysm, without rupture, unspecified: Secondary | ICD-10-CM

## 2016-08-31 MED ORDER — IOPAMIDOL (ISOVUE-370) INJECTION 76%
75.0000 mL | Freq: Once | INTRAVENOUS | Status: AC | PRN
  Administered 2016-08-31: 75 mL via INTRAVENOUS

## 2016-08-31 NOTE — Progress Notes (Signed)
[address]       [place] [address]             [phone]                    Emerson S Villarin Coffey Medical Record [id number] Date of Birth: 02-15-1946  Referring: Thompson Grayer, MD Primary Care: Dorothyann Peng, NP  Chief Complaint:    Mildly dilated aortic root   History of Present Illness:    Daniel Guerrero 71 y.o. male is seen in the office  today for for mildly dilated aortic root found incidentally on an echocardiogram. Patient notes that he's had episodes of fast heart rate since his teens.  He reports that he has had palpitations since the age of 5 that historically self terminated. 4 years ago, he developed persistent tachypalpitations for which he went to [facility] and was given adenosine which terminated episode. . .  He denies chest pain, dyspnea, PND, orthopnea, nausea, vomiting, dizziness, edema, weight gain.  Echo 06/10/15 demonstrated EF 60-65%, mildly to moderately dilated aortic root, LA 40.  Because of the mildly dilated aortic root on echocardiogram a CTA of the chest was performed and the patient was referred to cardiac surgery last year His echocardiogram demonstrates a trileaflet aortic valve.  Dr. Rayann Heman performed cardiac ablation.  He has no family history of aortic dissection or sudden death at a young age of unexplained cause. His father did not of her ruptured abdominal aneurysm at age 24, and nonoperative treatment was decided upon.  Current Activity/ Functional Status:  Patient is independent with mobility/ambulation, transfers, ADL's, IADL's.   Zubrod Score: At the time of surgery this patient's most appropriate activity status/level should be described as: []     0    Normal activity, no symptoms [x]     1    Restricted in physical strenuous activity but ambulatory, able to do out light work []     2    Ambulatory and capable of self care, unable to do work activities, up and about               >50 % of  waking hours                              []     3    Only limited self care, in bed greater than 50% of waking hours []     4    Completely disabled, no self care, confined to bed or chair []     5    Moribund   Past Medical History:  Diagnosis Date  . Allergy   . Cataract of both eyes    implants both eyes   . Eczema   . Enlarged aorta ([employer])   . Erectile dysfunction   . Hearing loss    high frequency  . History of inguinal hernia repair, bilateral   . Hypertension   . Kidney stones   . Loss of hearing    high frequency  . Meniere disease   . Obesity   . Paroxysmal SVT (supraventricular tachycardia) ([place])   . Psoriasis   . S/P appendectomy   . S/P tonsillectomy   . Seasonal allergies   . Type II or unspecified type diabetes mellitus without mention of complication, uncontrolled    12-29-14 Told he was prediabetic once    Past Surgical History:  Procedure  Laterality Date  . Ablasion    . APPENDECTOMY    . COLONOSCOPY     unsure where or when- states greater than 10 yrs ago per pt- was normal per pt and his wife   . CYSTOSCOPY WITH RETROGRADE PYELOGRAM, URETEROSCOPY AND STENT PLACEMENT Left 12/30/2014   Procedure: CYSTOSCOPY WITH LEFT URETEROSCOPY STONE EXTRACTION WITH STENT;  Surgeon: Franchot Gallo, MD;  Location: [facility];  Service: Urology;  Laterality: Left;  . ELECTROPHYSIOLOGIC STUDY N/A 08/24/2015   Procedure: SVT Ablation;  Surgeon: Thompson Grayer, MD;  Location: [facility];  Service: Cardiovascular;  Laterality: N/A;  . EYE SURGERY     [place] removed both eye 30 yrs ago  . HERNIA REPAIR     right and left  . HOLMIUM LASER APPLICATION Left [id number]   Procedure: HOLMIUM LASER APPLICATION;  Surgeon: Franchot Gallo, MD;  Location: [facility];  Service: Urology;  Laterality: Left;  . TONSILLECTOMY      Family History  Problem Relation Age of Onset  . Cancer Mother     lung  . Thyroid disease Mother   . Diabetes Father   . GI Bleed Father   . Aneurysm  Father   . AAA (abdominal aortic aneurysm) Father   . Colon cancer Neg Hx   . Colon polyps Neg Hx   . Rectal cancer Neg Hx   . Stomach cancer Neg Hx    Patient's mother died at age 62 of lung cancer he has one sister and one brother are healthy Social History   Social History  . Marital Status: Married    Spouse Name: N/A  . Number of Children: N/A  . Years of Education: N/A   Occupational History  .  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 Smoking status: Former Research scientist (life sciences)  . Smokeless tobacco: Never Used     Comment: Quit 20-30 years ago  . Alcohol Use: 25.2 oz/week    7 Glasses of wine, 28 Cans of beer, 7 Shots of liquor per week     Comment: occasional  . Drug Use: No  . Sexual Activity: Not on file          Social History Narrative   Daily Caffeine use: 5-6 drinks daily   Exercising 2 times per week   No Drug use             History  Smoking Status  . Former Smoker  . Quit date: 07/24/1964  Smokeless Tobacco  . Never Used    Comment: Quit 20-30 years ago    History  Alcohol Use  . 25.2 oz/week  . 7 Glasses of wine, 28 Cans of beer, 7 Shots of liquor per week    Comment: occasional     Allergies  Allergen Reactions  . Peanut-Containing Drug Products Hives  . Penicillins Hives    Has patient had a PCN reaction causing immediate rash, facial/tongue/throat swelling, SOB or lightheadedness with hypotension: No Has patient had a PCN reaction causing severe rash involving mucus membranes or skin necrosis: No Has patient had a PCN reaction that required hospitalization No Has patient had a PCN reaction occurring within the last 10 years: No If all of the above answers are "NO", then may proceed with Cephalosporin use.    Current Outpatient Prescriptions  Medication Sig Dispense Refill  . aspirin 81 MG chewable tablet Chew 81 mg by mouth daily.    Marland Kitchen  b complex vitamins tablet Take 1 tablet by  mouth daily.    . Cyanocobalamin (VITAMIN B-12 PO) Take 1 tablet by mouth daily.    . fluticasone (FLONASE) 50 MCG/ACT nasal spray Place 2 sprays into both nostrils daily as needed for allergies or rhinitis.    Marland Kitchen lansoprazole (PREVACID) 15 MG capsule Take 15 mg by mouth daily as needed (for indigestion).    Marland Kitchen loratadine (CLARITIN) 10 MG tablet Take 10 mg by mouth daily as needed for allergies.    Marland Kitchen triamcinolone ointment (KENALOG) 0.5 % Apply 1 application topically 2 (two) times daily. 30 g 0  . vitamin C (ASCORBIC ACID) 500 MG tablet Take 500 mg by mouth daily.     Current Facility-Administered Medications  Medication Dose Route Frequency Provider Last Rate Last Dose  . 0.9 %  sodium chloride infusion  500 mL Intravenous Continuous Irene Shipper, MD          Review of Systems:     Cardiac Review of Systems: Y or N  Chest Pain [ n  ]  Resting SOB [ n  ] Exertional SOB  Blue.Reese  ]  Vertell Limber Florencio.Farrier  ]   Pedal Edema Florencio.Farrier   ]    Palpitations Blue.Reese  ] Syncope  [one episode  ]   Presyncope [ n  ]  General Review of Systems: [Y] = yes [  ]=no Constitional: recent weight change [  ];  Wt loss over the last 3 months [   ] anorexia [  ]; fatigue [  ]; nausea [  ]; night sweats [  ]; fever [  ]; or chills [  ];          Dental: poor dentition[ n ]; Last Dentist visit:   Eye : blurred vision [  ]; diplopia [   ]; vision changes [  ];  Amaurosis fugax[  ]; Resp: cough [  ];  wheezing[  ];  hemoptysis[  ]; shortness of breath[  ]; paroxysmal nocturnal dyspnea[  ]; dyspnea on exertion[  ]; or orthopnea[  ];  GI:  gallstones[  ], vomiting[  ];  dysphagia[  ]; melena[  ];  hematochezia [  ]; heartburn[  ];   Hx of  Colonoscopy[ 10 years ago ]; GU: kidney stones [  ]; hematuria[  ];   dysuria [  ];  nocturia[  ];  history of     obstruction [  ]; urinary frequency [  ]             Skin: rash, swelling[  ];, hair loss[  ];  peripheral edema[  ];  or itching[  ]; Musculosketetal: myalgias[  ];  joint swelling[  ];  joint  erythema[  ];  joint pain[  ];  back pain[  ];  Heme/Lymph: bruising[  ];  bleeding[  ];  anemia[  ];  Neuro: TIA[  ];  headaches[  ];  stroke[  ];  vertigo[  ];  seizures[  ];   paresthesias[  ];  difficulty walking[  ];  Psych:depression[  ]; anxiety[  ];  Endocrine: diabetes[  ];  thyroid dysfunction[  ];  Immunizations: Flu up to date [ y ]; Pneumococcal up to date [ n ];  Other:  Physical Exam: BP (!) 155/90   Pulse 60   Resp 20   Ht 5\' 10"  (1.778 m)   Wt 220 lb (99.8 kg)   SpO2 95%   BMI 31.57  kg/m   PHYSICAL EXAMINATION: General appearance: alert, cooperative, appears stated age and no distress Head: Normocephalic, without obvious abnormality, atraumatic Neck: no adenopathy, no carotid bruit, no JVD, supple, symmetrical, trachea midline and thyroid not enlarged, symmetric, no tenderness/mass/nodules Lymph nodes: Cervical, supraclavicular, and axillary nodes normal. Resp: clear to auscultation bilaterally Back: symmetric, no curvature. ROM normal. No CVA tenderness. Cardio: regular rate and rhythm, S1, S2 normal, no murmur, click, rub or gallop GI: soft, non-tender; bowel sounds normal; no masses,  no organomegaly Extremities: extremities normal, atraumatic, no cyanosis or edema and Homans sign is negative, no sign of DVT Neurologic: Grossly normal Patient has 2+ DP and PT pulses bilaterally  Diagnostic Studies & Laboratory data:     Recent Radiology Findings:  Dg Chest 2 View  Result Date: 08/18/2016 CLINICAL DATA:  Trouble swallowing since yesterday.  Cough. EXAM: CHEST  2 VIEW COMPARISON:  02/24/2015 and 06/24/2015 as well as chest CT 07/15/2015 FINDINGS: Lungs are adequately inflated without focal consolidation or effusion. Mild linear scarring right base. Cardiomediastinal silhouette is within normal. There is tortuosity mild calcification of the thoracic aorta. Mild degenerate change of the spine. IMPRESSION: No acute cardiopulmonary disease. Electronically Signed    By: Marin Olp M.D.   On: 08/18/2016 18:54   Ct Angio Chest Aorta W &/or Wo Contrast  Result Date: 08/31/2016 CLINICAL DATA:  Thoracic aortic aneurysm without rupture. EXAM: CT ANGIOGRAPHY CHEST WITH CONTRAST TECHNIQUE: Multidetector CT imaging of the chest was performed using the standard protocol during bolus administration of intravenous contrast. Multiplanar CT image reconstructions and MIPs were obtained to evaluate the vascular anatomy. Creatinine was obtained on site at [facility] at [address] Results: Creatinine 0.9 mg/dL. CONTRAST:  75 mL Isovue 370 COMPARISON:  07/15/2015 FINDINGS: Cardiovascular: Mild dilatation of the aortic root measuring up to 4.3 cm. This is unchanged from the previous examination. Normal caliber of the mid ascending thoracic aorta measuring roughly 3.1 cm. Proximal descending thoracic aorta measures 3.0 cm and stable. Mid descending thoracic aorta measures 2.9 cm. The descending thoracic aorta is tortuous. There is no evidence for an aortic dissection. Main pulmonary arteries are patent and there is no evidence to suggest a pulmonary embolism. Atherosclerotic calcifications involving the right coronary artery and the LAD. Great vessels are patent. Mediastinum/Nodes: Mild dilatation of the mid esophagus is similar to the previous examination. There is no chest lymphadenopathy. No axillary lymphadenopathy. No significant pericardial fluid. Lungs/Pleura: Trachea and mainstem bronchi are patent. Mild volume loss at the lung bases. There is no evidence for airspace disease or lung consolidation. No significant pleural fluid. Upper Abdomen: Again noted are numerous hypodense lesions in the liver. Findings compatible with multiple hepatic cysts. Mild thickening of the left adrenal gland could represent hyperplasia. Musculoskeletal: Disc space narrowing and degenerative changes in the lower cervical spine. Bridging osteophytes in lower thoracic spine. Review of the  MIP images confirms the above findings. IMPRESSION: Stable dilatation of the aortic root, measuring up to 4.3 cm. Coronary artery calcifications. No acute chest abnormality. Multiple hepatic cysts. Electronically Signed   By: Markus Daft M.D.   On: 08/31/2016 14:26    Ct Angio Chest Aorta W/cm &/or Wo/cm  07/15/2015  CLINICAL DATA:  Evaluate aortic root dilatation pre EXAM: CT ANGIOGRAPHY CHEST WITH CONTRAST TECHNIQUE: Multidetector CT imaging of the chest was performed using the standard protocol during bolus administration of intravenous contrast. Multiplanar CT image reconstructions and MIPs were obtained to evaluate the vascular anatomy. CONTRAST:  176mL  OMNIPAQUE IOHEXOL 350 MG/ML SOLN COMPARISON:  08/02/2010 FINDINGS: Lungs are clear.  Airways are normal. Heart size is normal. There is calcified plaque over the left anterior descending and right coronary arteries. Minimal calcified plaque over the thoracic aorta. The aortic root measures 3.9 cm on the coronal reformatted images and 4.2 cm AP diameter on the axial images which is borderline/mildly dilated. Aorta at the sinotubular junction measures 3.1 cm compatible with minimal ectasia. Mid ascending thoracic aorta measures 3 cm which is normal. The aortic arch and descending thoracic aorta are within normal in caliber. There is no evidence of dissection. No evidence of hilar, mediastinal or axillary adenopathy. Remaining mediastinal structures are within normal. Images through the upper abdomen demonstrate multiple well-defined hypodensities within the liver compatible with cysts. There are minimal degenerative changes of the spine. Review of the MIP images confirms the above findings. IMPRESSION: No acute cardiopulmonary disease. Borderline dilatation of the aortic root measuring 3.9-4.2 cm. Ascending thoracic aorta, aortic arch as well as descending thoracic aorta are within normal in caliber. Recommend semi-annual imaging followup by CTA or MRA and  referral to cardiothoracic surgery if not already obtained. This recommendation follows 2010 ACCF/AHA/AATS/ACR/ASA/SCA/SCAI/SIR/STS/SVM Guidelines for the Diagnosis and Management of Patients With Thoracic Aortic Disease. Circulation. 2010; 121: e266-e36. Atherosclerotic coronary artery disease. Multiple liver cysts. Electronically Signed   By: Marin Olp M.D.   On: 07/15/2015 17:26     I have independently reviewed the above radiology studies  and reviewed the findings with the patient.   Recent Lab Findings: Lab Results  Component Value Date   WBC 7.1 04/14/2016   HGB 13.6 08/18/2016   HCT 40.0 08/18/2016   PLT 213.0 04/14/2016   GLUCOSE 93 08/18/2016   CHOL 151 04/14/2016   TRIG 109.0 04/14/2016   HDL 34.50 (L) 04/14/2016   LDLCALC 95 04/14/2016   ALT 20 04/14/2016   AST 49 (H) 04/14/2016   NA 142 08/18/2016   K 4.0 08/18/2016   CL 105 08/18/2016   CREATININE 1.20 08/18/2016   BUN 19 08/18/2016   CO2 30 04/14/2016   TSH 1.58 04/14/2016   HGBA1C 5.5 04/14/2016   Echocardiogram 08/28/2016 Echocardiography  Patient:    Judith, Kempe MR #:       [id number] Study Date: 08/28/2016 Gender:     M Age:        32 Height:     177.8 cm Weight:     101.6 kg BSA:        2.27 m^2 Pt. Status: Room:   ORDERING    Lanelle Bal MD  REFERRING   Lanelle Bal MD  ATTENDING   Candee Furbish, M.D.  PERFORMING  [facility], Outpatient  cc:  ------------------------------------------------------------------- LV EF: 55% -   60%  ------------------------------------------------------------------- Indications:      Ascending aortic aneurysm (I71.2).  ------------------------------------------------------------------- Study Conclusions  - Left ventricle: The cavity size was normal. Wall thickness was   increased in a pattern of moderate LVH. Systolic function was   normal. The estimated ejection fraction was in the range of 55%   to 60%. There is hypokinesis of the  anteroseptal and apical   myocardium. Doppler parameters are consistent with abnormal left   ventricular relaxation (grade 1 diastolic dysfunction). - Aortic valve: There was mild regurgitation. - Aorta: Aortic root dimension: 40 mm (ED), prior echocardiogram   33mm. - Ascending aorta: The ascending aorta was mildly dilated. - Mitral valve: There was trivial regurgitation. - Pulmonary arteries: Systolic pressure was  mildly increased. PA   peak pressure: 33 mm Hg (S).  Impressions:  - Compared to the prior study, there has been no significant   interval change.  ------------------------------------------------------------------- Study data:  Comparison was made to the study of 06/10/2015.  Study status:  Routine.  Procedure:  The patient reported no pain pre or post test. Transthoracic echocardiography. Image quality was adequate.          Echocardiography.  M-mode, complete 2D, spectral Doppler, and color Doppler.  Birthdate:  Patient birthdate: Apr 12, 1946.  Age:  Patient is 71 yr old.  Sex:  Gender: male. BMI: 32.1 kg/m^2.  Blood pressure:     152/71  Patient status: Outpatient.  Study date:  Study date: 08/28/2016. Study time: 09:54 AM.  Location:  [facility]  -------------------------------------------------------------------  ------------------------------------------------------------------- Left ventricle:  The cavity size was normal. Wall thickness was increased in a pattern of moderate LVH. Systolic function was normal. The estimated ejection fraction was in the range of 55% to 60%.  Regional wall motion abnormalities:   There is hypokinesis of the anteroseptal and apical myocardium. Doppler parameters are consistent with abnormal left ventricular relaxation (grade 1 diastolic dysfunction).  ------------------------------------------------------------------- Aortic valve:   Trileaflet; normal thickness leaflets. Mobility was not restricted.  Doppler:   Transvalvular velocity was within the normal range. There was no stenosis. There was mild regurgitation.   ------------------------------------------------------------------- Aorta:  Ascending aorta: The ascending aorta was mildly dilated.  ------------------------------------------------------------------- Mitral valve:   Structurally normal valve.   Mobility was not restricted.  Doppler:  Transvalvular velocity was within the normal range. There was no evidence for stenosis. There was trivial regurgitation.  ------------------------------------------------------------------- Left atrium:  The atrium was normal in size.  ------------------------------------------------------------------- Right ventricle:  The cavity size was normal. Wall thickness was normal. Systolic function was normal.  ------------------------------------------------------------------- Pulmonic valve:    Doppler:  Transvalvular velocity was within the normal range. There was no evidence for stenosis.  ------------------------------------------------------------------- Tricuspid valve:   Structurally normal valve.    Doppler: Transvalvular velocity was within the normal range. There was no regurgitation.  ------------------------------------------------------------------- Pulmonary artery:   The main pulmonary artery was normal-sized. Systolic pressure was mildly increased.  ------------------------------------------------------------------- Right atrium:  The atrium was normal in size.  ------------------------------------------------------------------- Pericardium:  There was no pericardial effusion.  ------------------------------------------------------------------- Systemic veins: Inferior vena cava: The vessel was normal in size. The respirophasic diameter changes were in the normal range (= 50%), consistent with normal central venous pressure. Diameter: 18  mm.  ------------------------------------------------------------------- Measurements   IVC                                         Value        Reference  ID                                          18    mm     ---------    Left ventricle                              Value        Reference  LV ID, ED, PLAX chordal             (L)  40.6  mm     43 - 52  LV ID, ES, PLAX chordal                     28.1  mm     23 - 38  LV fx shortening, PLAX chordal              31    %      >=29  LV PW thickness, ED                         14.7  mm     ---------  IVS/LV PW ratio, ED                         1            <=1.3  Stroke volume, 2D                           98    ml     ---------  Stroke volume/bsa, 2D                       43    ml/m^2 ---------  LV e&', lateral                              9.76  cm/s   ---------  LV E/e&', lateral                            5.77         ---------  LV e&', medial                               6.91  cm/s   ---------  LV E/e&', medial                             8.15         ---------  LV e&', average                              8.34  cm/s   ---------  LV E/e&', average                            6.75         ---------    Ventricular septum                          Value        Reference  IVS thickness, ED                           14.7  mm     ---------    LVOT                                        Value        Reference  LVOT  ID, S                                  24    mm     ---------  LVOT area                                   4.52  cm^2   ---------  LVOT peak velocity, S                       82    cm/s   ---------  LVOT mean velocity, S                       59.9  cm/s   ---------  LVOT VTI, S                                 21.7  cm     ---------    Aortic valve                                Value        Reference  Aortic regurg pressure half-time            1620  ms     ---------    Aorta                                       Value         Reference  Aortic root ID, ED                          40    mm     ---------    Left atrium                                 Value        Reference  LA ID, A-P, ES                              37    mm     ---------  LA ID/bsa, A-P                              1.63  cm/m^2 <=2.2  LA volume, S                                45    ml     ---------  LA volume/bsa, S                            19.8  ml/m^2 ---------  LA volume, ES, 1-p A4C                      33    ml     ---------  LA volume/bsa, ES, 1-p A4C                  14.5  ml/m^2 ---------  LA volume, ES, 1-p A2C                      54    ml     ---------  LA volume/bsa, ES, 1-p A2C                  23.8  ml/m^2 ---------    Mitral valve                                Value        Reference  Mitral E-wave peak velocity                 56.3  cm/s   ---------  Mitral A-wave peak velocity                 74    cm/s   ---------  Mitral deceleration time                    215   ms     150 - 230  Mitral E/A ratio, peak                      0.8          ---------    Pulmonary arteries                          Value        Reference  PA pressure, S, DP                  (H)     33    mm Hg  <=30    Tricuspid valve                             Value        Reference  Tricuspid regurg peak velocity              276   cm/s   ---------  Tricuspid peak RV-RA gradient               30    mm Hg  ---------  Tricuspid maximal regurg velocity,          276   cm/s   ---------  PISA    Systemic veins                              Value        Reference  Estimated CVP                               3     mm Hg  ---------    Right ventricle                             Value        Reference  RV pressure, S, DP                  (  H)     33    mm Hg  <=30  RV s&', lateral, S                           9.65  cm/s   ---------  Legend: (L)  and  (H)  mark values outside specified reference  range.  ------------------------------------------------------------------- Prepared and Electronically Authenticated by  Candee Furbish, M.D. 2018-02-05T11:43:55  Aortic Size Index=     4.3    /Body surface area is 2.22 meters squared. = 1.9  < 2.75 cm/m2      4% risk per year 2.75 to 4.25          8% risk per year > 4.25 cm/m2    20% risk per year  cross sectional area of aorta cm2/height in meters > 10 consider  surgery     Assessment / Plan:   Borderline dilatation of the aortic root measuring 3.9-4.2 cm Ascending thoracic aorta, aortic arch as well as descending thoracic aorta are within normal in caliber  Status post ablation by Dr. Hadley Pen reviewed with the patient and his wife the radiographic and echo diagnosis of mild dilation of the aortic root, discuss implications. At this point I would not recommend any surgical intervention. Did review with the patient the signs and symptoms of aortic dissection and the need for continued follow-up. . The patient was under the assumption when he was referred to the thoracic surgery office but we will assume all of his cardiology care, I clarified with him that he continues to need cardiology follow-up, blood pressure control, lipid management, rhythm management. We'll plan repeat CTA of the chest in 18 months.  Need for good blood pressure control was reviewed with patient  According to the 2010 ACC/AHA guidelines, we recommend patients with thoracic aortic disease to maintain a LDL of less than 70 and a HDL of greater than 50. We recommend their blood pressure to remain less than 135/85.    I  spent 40 minutes counseling the patient face to face and 50% or more the  time was spent in counseling and coordination of care. The total time spent in the appointment was 60 minutes.  Grace Isaac MD      [address] [place], [address] Office [phone]   Beeper [phone]  08/31/2016 3:04  PM

## 2016-09-06 ENCOUNTER — Encounter (HOSPITAL_COMMUNITY)
Admission: RE | Disposition: A | Discharge status: Home / Self Care | Payer: Self-pay | Source: Ambulatory Visit | Attending: Internal Medicine

## 2016-09-06 ENCOUNTER — Encounter (HOSPITAL_COMMUNITY): Payer: Self-pay | Admitting: *Deleted

## 2016-09-06 ENCOUNTER — Ambulatory Visit (HOSPITAL_COMMUNITY)
Admission: RE | Admit: 2016-09-06 | Discharge: 2016-09-06 | Disposition: A | Discharge status: Home / Self Care | Payer: Medicare Other | Source: Ambulatory Visit | Attending: Internal Medicine | Admitting: Internal Medicine

## 2016-09-06 DIAGNOSIS — E119 Type 2 diabetes mellitus without complications: Secondary | ICD-10-CM | POA: Insufficient documentation

## 2016-09-06 DIAGNOSIS — K219 Gastro-esophageal reflux disease without esophagitis: Secondary | ICD-10-CM | POA: Insufficient documentation

## 2016-09-06 DIAGNOSIS — R1310 Dysphagia, unspecified: Secondary | ICD-10-CM

## 2016-09-06 DIAGNOSIS — K449 Diaphragmatic hernia without obstruction or gangrene: Secondary | ICD-10-CM | POA: Diagnosis not present

## 2016-09-06 DIAGNOSIS — K222 Esophageal obstruction: Secondary | ICD-10-CM | POA: Diagnosis not present

## 2016-09-06 DIAGNOSIS — Z79899 Other long term (current) drug therapy: Secondary | ICD-10-CM | POA: Insufficient documentation

## 2016-09-06 DIAGNOSIS — I10 Essential (primary) hypertension: Secondary | ICD-10-CM | POA: Insufficient documentation

## 2016-09-06 DIAGNOSIS — Z87891 Personal history of nicotine dependence: Secondary | ICD-10-CM | POA: Diagnosis not present

## 2016-09-06 DIAGNOSIS — Z7982 Long term (current) use of aspirin: Secondary | ICD-10-CM | POA: Insufficient documentation

## 2016-09-06 HISTORY — PX: MALONEY DILATION: SHX5535

## 2016-09-06 HISTORY — PX: ESOPHAGOGASTRODUODENOSCOPY: SHX5428

## 2016-09-06 SURGERY — EGD (ESOPHAGOGASTRODUODENOSCOPY)
Anesthesia: Moderate Sedation

## 2016-09-06 MED ORDER — LIDOCAINE VISCOUS 2 % MT SOLN
OROMUCOSAL | Status: DC | PRN
  Administered 2016-09-06: 1 via OROMUCOSAL

## 2016-09-06 MED ORDER — ONDANSETRON HCL 4 MG/2ML IJ SOLN
INTRAMUSCULAR | Status: AC
  Filled 2016-09-06: qty 2

## 2016-09-06 MED ORDER — SIMETHICONE 40 MG/0.6ML PO SUSP
ORAL | Status: DC | PRN
  Administered 2016-09-06: 2.5 mL

## 2016-09-06 MED ORDER — MEPERIDINE HCL 100 MG/ML IJ SOLN
INTRAMUSCULAR | Status: DC | PRN
  Administered 2016-09-06: 25 mg via INTRAVENOUS
  Administered 2016-09-06: 50 mg via INTRAVENOUS

## 2016-09-06 MED ORDER — MIDAZOLAM HCL 5 MG/5ML IJ SOLN
INTRAMUSCULAR | Status: AC
  Filled 2016-09-06: qty 10

## 2016-09-06 MED ORDER — MEPERIDINE HCL 100 MG/ML IJ SOLN
INTRAMUSCULAR | Status: AC
  Filled 2016-09-06: qty 2

## 2016-09-06 MED ORDER — ONDANSETRON HCL 4 MG/2ML IJ SOLN: INTRAMUSCULAR | Status: DC | PRN

## 2016-09-06 MED ORDER — MIDAZOLAM HCL 5 MG/5ML IJ SOLN
INTRAMUSCULAR | Status: DC | PRN
  Administered 2016-09-06 (×2): 1 mg via INTRAVENOUS
  Administered 2016-09-06: 2 mg via INTRAVENOUS

## 2016-09-06 MED ORDER — LIDOCAINE VISCOUS 2 % MT SOLN
OROMUCOSAL | Status: AC
  Filled 2016-09-06: qty 15

## 2016-09-06 MED ORDER — SODIUM CHLORIDE 0.9 % IV SOLN
INTRAVENOUS | Status: DC
  Administered 2016-09-06: 14:00:00 via INTRAVENOUS

## 2016-09-06 NOTE — Discharge Instructions (Signed)
GERD information provided  Continue Prevacid 30 mg daily  Office visit with me in 6 months   Food Choices for Gastroesophageal Reflux Disease, Adult When you have gastroesophageal reflux disease (GERD), the foods you eat and your eating habits are very important. Choosing the right foods can help ease your discomfort. What guidelines do I need to follow?  Choose fruits, vegetables, whole grains, and low-fat dairy products.  Choose low-fat meat, fish, and poultry.  Limit fats such as oils, salad dressings, butter, nuts, and avocado.  Keep a food diary. This helps you identify foods that cause symptoms.  Avoid foods that cause symptoms. These may be different for everyone.  Eat small meals often instead of 3 large meals a day.  Eat your meals slowly, in a place where you are relaxed.  Limit fried foods.  Cook foods using methods other than frying.  Avoid drinking alcohol.  Avoid drinking large amounts of liquids with your meals.  Avoid bending over or lying down until 2-3 hours after eating. What foods are not recommended? These are some foods and drinks that may make your symptoms worse: Vegetables  Tomatoes. Tomato juice. Tomato and spaghetti sauce. Chili peppers. Onion and garlic. Horseradish. Fruits  Oranges, grapefruit, and lemon (fruit and juice). Meats  High-fat meats, fish, and poultry. This includes hot dogs, ribs, ham, sausage, salami, and bacon. Dairy  Whole milk and chocolate milk. Sour cream. Cream. Butter. Ice cream. Cream cheese. Drinks  Coffee and tea. Bubbly (carbonated) drinks or energy drinks. Condiments  Hot sauce. Barbecue sauce. Sweets/Desserts  Chocolate and cocoa. Donuts. Peppermint and spearmint. Fats and Oils  High-fat foods. This includes [place] fries and potato chips. Other  Vinegar. Strong spices. This includes black pepper, white pepper, red pepper, cayenne, curry powder, cloves, ginger, and chili powder. The items listed above  may not be a complete list of foods and drinks to avoid. Contact your dietitian for more information.  This information is not intended to replace advice given to you by your health care provider. Make sure you discuss any questions you have with your health care provider. Document Released: 01/09/2012 Document Revised: 12/16/2015 Document Reviewed: 05/14/2013 Elsevier Interactive Patient Education  2017 [place]. EGD Discharge instructions Please read the instructions outlined below and refer to this sheet in the next few weeks. These discharge instructions provide you with general information on caring for yourself after you leave the hospital. Your doctor may also give you specific instructions. While your treatment has been planned according to the most current medical practices available, unavoidable complications occasionally occur. If you have any problems or questions after discharge, please call your doctor. ACTIVITY  You may resume your regular activity but move at a slower pace for the next 24 hours.   Take frequent rest periods for the next 24 hours.   Walking will help expel (get rid of) the air and reduce the bloated feeling in your abdomen.   No driving for 24 hours (because of the anesthesia (medicine) used during the test).   You may shower.   Do not sign any important legal documents or operate any machinery for 24 hours (because of the anesthesia used during the test).  NUTRITION  Drink plenty of fluids.   You may resume your normal diet.   Begin with a light meal and progress to your normal diet.   Avoid alcoholic beverages for 24 hours or as instructed by your caregiver.  MEDICATIONS  You may resume your normal  medications unless your caregiver tells you otherwise.  WHAT YOU CAN EXPECT TODAY  You may experience abdominal discomfort such as a feeling of fullness or gas pains.  FOLLOW-UP  Your doctor will discuss the results of your test with you.  SEEK  IMMEDIATE MEDICAL ATTENTION IF ANY OF THE FOLLOWING OCCUR:  Excessive nausea (feeling sick to your stomach) and/or vomiting.   Severe abdominal pain and distention (swelling).   Trouble swallowing.   Temperature over 101 F (37.8 C).   Rectal bleeding or vomiting of blood.

## 2016-09-06 NOTE — Op Note (Signed)
[facility] Patient Name: Daniel Guerrero Procedure Date: 09/06/2016 2:02 PM MRN: [id number] Date of Birth: [birth year] Attending MD: Norvel Richards , MD CSN: [id number] Age: 71 Admit Type: Outpatient Procedure:                Upper GI endoscopy with [facility] dilation. Indications:              Dysphagia; recent food impaction Providers:                Norvel Richards, MD, Lurline Del, RN, Purcell Nails.                            Icard, Merchant [employer] officer Referring MD:              Medicines:                Midazolam 4 mg IV, Meperidine 75 mg IV; Zofran 4 mg                            IV. Complications:            No immediate complications. Estimated Blood Loss:     Estimated blood loss: none. Procedure:                Pre-Anesthesia Assessment:                           - Prior to the procedure, a History and Physical                            was performed, and patient medications and                            allergies were reviewed. The patient's tolerance of                            previous anesthesia was also reviewed. The risks                            and benefits of the procedure and the sedation                            options and risks were discussed with the patient.                            All questions were answered, and informed consent                            was obtained. Prior Anticoagulants: The patient has                            taken no previous anticoagulant or antiplatelet                            agents. ASA Grade Assessment: II - A patient with  mild systemic disease. After reviewing the risks                            and benefits, the patient was deemed in                            satisfactory condition to undergo the procedure.                           After obtaining informed consent, the endoscope was                            passed under direct vision. Throughout the   procedure, the patient's blood pressure, pulse, and                            oxygen saturations were monitored continuously. The                            [id number] scope was introduced through the                            mouth, and advanced to the second part of duodenum.                            The upper GI endoscopy was accomplished without                            difficulty. The patient tolerated the procedure                            well. Scope In: 2:15:55 PM Scope Out: 2:27:49 PM Total Procedure Duration: 0 hours 11 minutes 54 seconds  Findings:      A mild Schatzki ring (acquired) was found at the gastroesophageal       junction. No Barrett's esophagus. No tumor. No esophagitis.      A small hiatal hernia was present. Stomach otherwise normal.      The duodenal bulb and second portion of the duodenum were normal. The       scope was withdrawn. Dilation was performed with a Maloney dilator with       no resistance at 56 Fr. The dilation site was examined following       endoscope reinsertion and showed mild improvement in luminal narrowing.       Estimated blood loss: none. Impression:               - Mild Schatzki ring. Dilated.                           - Small hiatal hernia.                           - Normal duodenal bulb and second portion of the                            duodenum.                           -  No specimens collected. Moderate Sedation:      Moderate (conscious) sedation was administered by the endoscopy nurse       and supervised by the endoscopist. The following parameters were       monitored: oxygen saturation, heart rate, blood pressure, respiratory       rate, EKG, adequacy of pulmonary ventilation, and response to care.       Total physician intraservice time was 17 minutes. Recommendation:           - Patient has a contact number available for                            emergencies. The signs and symptoms of potential                             delayed complications were discussed with the                            patient. Return to normal activities tomorrow.                            Written discharge instructions were provided to the                            patient.                           - Resume previous diet.                           - Continue present medications. Continue Prevacid                            30 mg daily.                           - No repeat upper endoscopy.                           - Return to GI office in 6 months. Procedure Code(s):        --- Professional ---                           [phone], Esophagogastroduodenoscopy, flexible,                            transoral; diagnostic, including collection of                            specimen(s) by brushing or washing, when performed                            (separate procedure)                           [id number], Dilation of esophagus, by unguided sound or  bougie, single or multiple passes                           [id number], Moderate sedation services provided by the                            same physician or other qualified health care                            professional performing the diagnostic or                            therapeutic service that the sedation supports,                            requiring the presence of an independent trained                            observer to assist in the monitoring of the                            patient's level of consciousness and physiological                            status; initial 15 minutes of intraservice time,                            patient age 7 years or older Diagnosis Code(s):        --- Professional ---                           K22.2, Esophageal obstruction                           K44.9, Diaphragmatic hernia without obstruction or                            gangrene                           R13.10, Dysphagia, unspecified CPT  copyright 2016 American Medical Association. All rights reserved. The codes documented in this report are preliminary and upon coder review may  be revised to meet current compliance requirements. Cristopher Estimable. Arif Amendola, MD Norvel Richards, MD 09/06/2016 2:37:48 PM This report has been signed electronically. Number of Addenda: 0

## 2016-09-06 NOTE — H&P (View-Only) (Signed)
@LOGO @   Primary Care Physician:  Dorothyann Peng, NP Primary Gastroenterologist:  Dr. Gala Romney  Pre-Procedure History & Physical: HPI:  Daniel Guerrero is a 71 y.o. male here for probable food impaction. Has had trouble with esophageal dysphagia over the past several days. Swallowed some earlier today and felt it "hang" behind his breast bone. Not able to even swallow saliva. Saw Dr. Roderic Palau in the ED. Glucagon was not effective. Dr. Kendal Hymen called me. Patient has long-standing reflux  -  takes lansoprazole 15 mg "as needed". History of esophageal dilation years ago in [place]. He was told he had a "rough looking ring"  He sees a gastroenterologist in [place]. Colonoscopy not too long reportedly yielded 6 polyps.  Past Medical History:  Diagnosis Date  . Allergy   . Cataract of both eyes    implants both eyes   . Eczema   . Enlarged aorta ([place])   . Erectile dysfunction   . Hearing loss    high frequency  . History of inguinal hernia repair, bilateral   . Hypertension   . Kidney stones   . Loss of hearing    high frequency  . Meniere disease   . Obesity   . Paroxysmal SVT (supraventricular tachycardia) ([place])   . Psoriasis   . S/P appendectomy   . S/P tonsillectomy   . Seasonal allergies   . Type II or unspecified type diabetes mellitus without mention of complication, uncontrolled    12-29-14 Told he was prediabetic once    Past Surgical History:  Procedure Laterality Date  . Ablasion    . APPENDECTOMY    . COLONOSCOPY     unsure where or when- states greater than 10 yrs ago per pt- was normal per pt and his wife   . CYSTOSCOPY WITH RETROGRADE PYELOGRAM, URETEROSCOPY AND STENT PLACEMENT Left 12/30/2014   Procedure: CYSTOSCOPY WITH LEFT URETEROSCOPY STONE EXTRACTION WITH STENT;  Surgeon: Franchot Gallo, MD;  Location: [facility];  Service: Urology;  Laterality: Left;  . ELECTROPHYSIOLOGIC STUDY N/A 08/24/2015   Procedure: SVT Ablation;  Surgeon: Thompson Grayer, MD;  Location: [facility];  Service: Cardiovascular;  Laterality: N/A;  . EYE SURGERY     Catracts removed both eye 30 yrs ago  . HERNIA REPAIR     right and left  . HOLMIUM LASER APPLICATION Left [id number]   Procedure: HOLMIUM LASER APPLICATION;  Surgeon: Franchot Gallo, MD;  Location: [facility];  Service: Urology;  Laterality: Left;  . TONSILLECTOMY      Prior to Admission medications   Medication Sig Start Date End Date Taking? Authorizing Provider  aspirin 81 MG chewable tablet Chew 81 mg by mouth daily.   Yes Historical Provider, MD  b complex vitamins tablet Take 1 tablet by mouth daily.   Yes Historical Provider, MD  Cyanocobalamin (VITAMIN B-12 PO) Take 1 tablet by mouth daily.   Yes Historical Provider, MD  fluticasone (FLONASE) 50 MCG/ACT nasal spray Place 2 sprays into both nostrils daily as needed for allergies or rhinitis.   Yes Historical Provider, MD  lansoprazole (PREVACID) 15 MG capsule Take 15 mg by mouth daily as needed (for indigestion).   Yes Historical Provider, MD  loratadine (CLARITIN) 10 MG tablet Take 10 mg by mouth daily as needed for allergies.   Yes Historical Provider, MD  triamcinolone ointment (KENALOG) 0.5 % Apply 1 application topically 2 (two) times daily. 04/14/16  Yes Dorothyann Peng, NP  vitamin C (ASCORBIC ACID) 500 MG tablet Take  500 mg by mouth daily.   Yes Historical Provider, MD  atorvastatin (LIPITOR) 20 MG tablet Take 1 tablet (20 mg total) by mouth daily at 6 PM. Patient not taking: Reported on 07/18/2016 06/11/15   Erline Hau, MD    Allergies as of 08/18/2016 - Review Complete 08/18/2016  Allergen Reaction Noted  . Peanut-containing drug products Hives 12/31/2009  . Penicillins Hives 03/11/2008    Family History  Problem Relation Age of Onset  . Cancer Mother     lung  . Thyroid disease Mother   . Diabetes Father   . GI Bleed Father   . Aneurysm Father   . AAA (abdominal aortic aneurysm) Father   . Colon cancer Neg Hx   . Colon  polyps Neg Hx   . Rectal cancer Neg Hx   . Stomach cancer Neg Hx     Social History   Social History  . Marital status: Married    Spouse name: N/A  . Number of children: N/A  . Years of education: N/A   Occupational History  . Not on file.   Social History Main Topics  . Smoking status: Former Smoker    Quit date: 07/24/1964  . Smokeless tobacco: Never Used     Comment: Quit 20-30 years ago  . Alcohol use 25.2 oz/week    7 Glasses of wine, 28 Cans of beer, 7 Shots of liquor per week     Comment: occasional  . Drug use: No  . Sexual activity: Not on file   Other Topics Concern  . Not on file   Social History Narrative   Daily Caffeine use: 5-6 drinks daily   Exercising 2 times per week   No Drug use             Review of Systems: See HPI, otherwise negative ROS  Physical Exam: BP 159/80   Pulse 73   Temp 98.9 F (37.2 C) (Temporal)   Resp 20   Wt 224 lb (101.6 kg)   SpO2 96%   BMI 32.14 kg/m  General:   Alert,  Well-developed, well-nourished, pleasant and cooperative in NAD Mouth:  No deformity or lesions. Neck:  Supple; no masses or thyromegaly. No significant cervical adenopathy. Lungs:  Clear throughout to auscultation.   No wheezes, crackles, or rhonchi. No acute distress. Heart:  Regular rate and rhythm; no murmurs, clicks, rubs,  or gallops. Abdomen: Non-distended, normal bowel sounds.  Soft and nontender without appreciable mass or hepatosplenomegaly.  Pulses:  Normal pulses noted. Extremities:  Without clubbing or edema.  Impression:  Pleasant 71 year old gentleman with GERD history of esophageal dysphagia possibly a ring requiring dilation previously presents to the ED this evening with the clinical picture consistent with an esophageal food impaction.  Urgent EGD warranted. I explained to the patient that if he has marked inflammation and ongoing impaction with food debris in his GI tract, our goal this evening would be to disimpact and not  dilate. Dilation would be best performed at another time in an elective setting.The risks, benefits, limitations, alternatives and imponderables have been reviewed with the patient. Potential for esophageal dilation, biopsy, etc. have also been reviewed.  Questions have been answered. All parties agreeable.         Notice: This dictation was prepared with Dragon dictation along with smaller phrase technology. Any transcriptional errors that result from this process are unintentional and may not be corrected upon review.

## 2016-09-06 NOTE — Interval H&P Note (Signed)
History and Physical Interval Note:  09/06/2016 1:56 PM  Daniel Guerrero  has presented today for surgery, with the diagnosis of dysphagia  The various methods of treatment have been discussed with the patient and family. After consideration of risks, benefits and other options for treatment, the patient has consented to  Procedure(s) with comments: ESOPHAGOGASTRODUODENOSCOPY (EGD) (N/A) - 100 - moved to 2/14 @ 2:15 per [place] (N/A) as a surgical intervention .  The patient's history has been reviewed, patient examined, no change in status, stable for surgery.  I have reviewed the patient's chart and labs.  Questions were answered to the patient's satisfaction.     Manus Rudd  Patient underwent urgent EGD with disimpaction on 1/26. Schatzki's ring found. He's had no dysphagia since starting Prevacid 30 mg daily since that time. He returns for elective EGD and dilation as feasible/appropriate.  The risks, benefits, limitations, alternatives and imponderables have been reviewed with the patient. Potential for esophageal dilation, biopsy, etc. have also been reviewed.  Questions have been answered. All parties agreeable.

## 2016-09-07 ENCOUNTER — Encounter (HOSPITAL_COMMUNITY): Payer: Self-pay | Admitting: Internal Medicine

## 2016-09-11 ENCOUNTER — Ambulatory Visit: Payer: Medicare Other | Admitting: Gastroenterology

## 2016-09-13 ENCOUNTER — Encounter (HOSPITAL_COMMUNITY): Payer: Self-pay | Admitting: Internal Medicine

## 2016-10-05 ENCOUNTER — Encounter: Payer: Self-pay | Admitting: Vascular Surgery

## 2016-10-17 ENCOUNTER — Ambulatory Visit: Payer: Medicare Other | Admitting: Vascular Surgery

## 2016-10-18 ENCOUNTER — Encounter: Payer: Self-pay | Admitting: Vascular Surgery

## 2016-10-30 ENCOUNTER — Ambulatory Visit (INDEPENDENT_AMBULATORY_CARE_PROVIDER_SITE_OTHER): Payer: Medicare Other | Admitting: Vascular Surgery

## 2016-10-30 ENCOUNTER — Encounter: Payer: Self-pay | Admitting: Vascular Surgery

## 2016-10-30 VITALS — BP 143/85 | HR 79 | Temp 97.5°F | Resp 16 | Ht 70.0 in | Wt 226.0 lb

## 2016-10-30 DIAGNOSIS — I83891 Varicose veins of right lower extremities with other complications: Secondary | ICD-10-CM

## 2016-10-30 HISTORY — DX: Varicose veins of right lower extremity with other complications: I83.891

## 2016-10-30 NOTE — Progress Notes (Signed)
Vitals:   10/30/16 1414  BP: (!) 149/85  Pulse: 79  Resp: 16  Temp: 97.5 F (36.4 C)  SpO2: 96%  Weight: 226 lb (102.5 kg)  Height: 5\' 10"  (1.778 m)

## 2016-10-30 NOTE — Progress Notes (Signed)
Subjective:     Patient ID: Daniel Guerrero, male   DOB: [birth year], 71 y.o.   MRN: [id number]  HPI This 71 year old male returns for 3 month follow-up regarding his painful varicosities in the right leg with 2 episodes of previous bleeding from varicosity near the ankle. He was found to have gross reflux and an enlarged right great saphenous vein at his previous visit 07/18/2016. He has worn long leg elastic compression stockings 20-30 millimeter gradient and tried elevation and ibuprofen. He continues to be quite concerned about recurrent bleeding from his right ankle. He has no history of stasis ulcers or DVT. He does develops swelling in the ankles as the day progresses.  Past Medical History:  Diagnosis Date  . Allergy   . Cataract of both eyes    implants both eyes   . Eczema   . Enlarged aorta ([place])   . Erectile dysfunction   . Hearing loss    high frequency  . History of inguinal hernia repair, bilateral   . Hypertension   . Kidney stones   . Loss of hearing    high frequency  . Meniere disease   . Obesity   . Paroxysmal SVT (supraventricular tachycardia) ([place])   . Psoriasis   . S/P appendectomy   . S/P tonsillectomy   . Seasonal allergies   . Type II or unspecified type diabetes mellitus without mention of complication, uncontrolled    12-29-14 Told he was prediabetic once    Social History  Substance Use Topics  . Smoking status: Former Smoker    Quit date: 07/24/1964  . Smokeless tobacco: Never Used     Comment: Quit 20-30 years ago  . Alcohol use 25.2 oz/week    7 Glasses of wine, 28 Cans of beer, 7 Shots of liquor per week     Comment: occasional    Family History  Problem Relation Age of Onset  . Cancer Mother     lung  . Thyroid disease Mother   . Diabetes Father   . GI Bleed Father   . Aneurysm Father   . AAA (abdominal aortic aneurysm) Father   . Colon cancer Neg Hx   . Colon polyps Neg Hx   . Rectal cancer Neg Hx   . Stomach cancer Neg Hx      Allergies  Allergen Reactions  . Peanut-Containing Drug Products Hives  . Penicillins Hives    Has patient had a PCN reaction causing immediate rash, facial/tongue/throat swelling, SOB or lightheadedness with hypotension: No Has patient had a PCN reaction causing severe rash involving mucus membranes or skin necrosis: No Has patient had a PCN reaction that required hospitalization No Has patient had a PCN reaction occurring within the last 10 years: No If all of the above answers are "NO", then may proceed with Cephalosporin use.     Current Outpatient Prescriptions:  .  aspirin 81 MG chewable tablet, Chew 81 mg by mouth daily., Disp: , Rfl:  .  b complex vitamins tablet, Take 1 tablet by mouth daily., Disp: , Rfl:  .  Cyanocobalamin (VITAMIN B-12 PO), Take 1 tablet by mouth daily., Disp: , Rfl:  .  fluticasone (FLONASE) 50 MCG/ACT nasal spray, Place 2 sprays into both nostrils daily as needed for allergies or rhinitis., Disp: , Rfl:  .  lansoprazole (PREVACID) 30 MG capsule, Take 1 capsule by mouth daily., Disp: , Rfl: 5 .  loratadine (CLARITIN) 10 MG tablet, Take 10 mg by mouth daily as  needed for allergies., Disp: , Rfl:  .  triamcinolone ointment (KENALOG) 0.5 %, Apply 1 application topically 2 (two) times daily. (Patient taking differently: Apply 1 application topically 2 (two) times daily as needed. ), Disp: 30 g, Rfl: 0 .  vitamin C (ASCORBIC ACID) 500 MG tablet, Take 500 mg by mouth daily., Disp: , Rfl:   Vitals:   10/30/16 1414 10/30/16 1416  BP: (!) 149/85 (!) 143/85  Pulse: 79 79  Resp: 16   Temp: 97.5 F (36.4 C)   SpO2: 96%   Weight: 226 lb (102.5 kg)   Height: 5\' 10"  (1.778 m)     Body mass index is 32.43 kg/m.         Review of Systems Denies chest pain, dyspnea on exertion, PND, orthopnea, hemoptysis    Objective:   Physical Exam BP (!) 143/85 (BP Location: Left Arm, Patient Position: Sitting, Cuff Size: Large)   Pulse 79   Temp 97.5 F (36.4  C)   Resp 16   Ht 5\' 10"  (1.778 m)   Wt 226 lb (102.5 kg)   SpO2 96%   BMI 32.43 kg/m   Well-developed well-nourished male no apparent distress alert and oriented 3 Lungs no rhonchi or wheezing Right leg with bulging varicosities in the midcalf and prominent reticular vein posterior to the right medial malleolus with 1+ edema.  Today I reviewed the previous reflux study done in our vascular lab and also performed in independent sono site ultrasound exam of the right leg. Patient has gross reflux throughout a large right great saphenous vein supplying these painful varicosities     Assessment:     Painful varicosities right leg with history of bleeding from varix right ankle on 2 occasions requiring trip to emergency department. This is due to gross reflux right great saphenous vein which are causing symptoms affecting patient's daily living. 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

## 2016-11-01 ENCOUNTER — Other Ambulatory Visit: Payer: Self-pay | Admitting: *Deleted

## 2016-11-01 DIAGNOSIS — I83891 Varicose veins of right lower extremities with other complications: Secondary | ICD-10-CM

## 2016-11-29 ENCOUNTER — Encounter: Payer: Self-pay | Admitting: Vascular Surgery

## 2016-11-29 ENCOUNTER — Encounter: Payer: Self-pay | Admitting: Internal Medicine

## 2016-12-04 ENCOUNTER — Encounter: Payer: Self-pay | Admitting: Vascular Surgery

## 2016-12-04 ENCOUNTER — Ambulatory Visit (INDEPENDENT_AMBULATORY_CARE_PROVIDER_SITE_OTHER): Payer: Medicare Other | Admitting: Vascular Surgery

## 2016-12-04 VITALS — BP 140/80 | HR 60 | Temp 97.4°F | Resp 16 | Ht 70.0 in | Wt 227.0 lb

## 2016-12-04 DIAGNOSIS — I83891 Varicose veins of right lower extremities with other complications: Secondary | ICD-10-CM

## 2016-12-04 DIAGNOSIS — I868 Varicose veins of other specified sites: Secondary | ICD-10-CM

## 2016-12-04 HISTORY — PX: ENDOVENOUS ABLATION SAPHENOUS VEIN W/ LASER: SUR449

## 2016-12-04 NOTE — Progress Notes (Signed)
Subjective:     Patient ID: Daniel Guerrero, male   DOB: 10/17/1945, 71 y.o.   MRN: [id number]  HPI This 71 year old male had laser ablation of the right great saphenous vein from the calf to near the saphenofemoral junction performed under local tumescent anesthesia. A total of 2786 J of energy was utilized. He tolerated the procedure well.  Review of Systems     Objective:   Physical Exam BP 140/80 (BP Location: Left Arm, Patient Position: Sitting, Cuff Size: Normal)   Pulse 60   Temp 97.4 F (36.3 C)   Resp 16   Ht 5\' 10"  (1.778 m)   Wt 227 lb (103 kg)   SpO2 95%   BMI 32.57 kg/m        Assessment:     Well-tolerated laser ablation right great saphenous vein from proximal calf to near the saphenofemoral junction performed under local tumescent anesthesia    Plan:     Return in 1 week for venous duplex exam to confirm closure right great saphenous vein

## 2016-12-04 NOTE — Progress Notes (Signed)
Laser Ablation Procedure    Date: 12/04/2016   Daniel Guerrero DOB:02-01-1946  Consent signed: Yes    Surgeon:  Dr. Nelda Severe. Kellie Simmering  Procedure: Laser Ablation: right Greater Saphenous Vein  BP 140/80 (BP Location: Left Arm, Patient Position: Sitting, Cuff Size: Normal)   Pulse 60   Temp 97.4 F (36.3 C)   Resp 16   Ht 5\' 10"  (1.778 m)   Wt 227 lb (103 kg)   SpO2 95%   BMI 32.57 kg/m   Tumescent Anesthesia: 475 cc 0.9% NaCl with 50 cc bupivacaine 0.5% HCL  and 15 cc 8.4% NaHCO3  Local Anesthesia: 10 cc Bupivacaine HCL   Pulsed Mode: 15 watts, 549ms delay, 1.0 duration  Total Energy:   2786 Joules           Total Pulses:   187             Total Time: 3:06      Patient tolerated procedure well    Description of Procedure:  After marking the course of the secondary varicosities, the patient was placed on the operating table in the supine position, and the right leg was prepped and draped in sterile fashion.   Local anesthetic was administered and under ultrasound guidance the saphenous vein was accessed with a micro needle and guide wire; then the mirco puncture sheath was placed.  A guide wire was inserted saphenofemoral junction , followed by a 5 french sheath.  The position of the sheath and then the laser fiber below the junction was confirmed using the ultrasound.  Tumescent anesthesia was administered along the course of the saphenous vein using ultrasound guidance. The patient was placed in Trendelenburg position and protective laser glasses were placed on patient and staff, and the laser was fired at 15 watts continuous mode advancing 1-25mm/second for a total of 2786 joules.      Steri strip was applied to the IV insertion site and ABD pads and thigh high compression stockings were applied.  Ace wrap bandages were applied at the top of the saphenofemoral junction. Blood loss was less than 15 cc.  The patient ambulated out of the operating room having tolerated the  procedure well.

## 2016-12-05 ENCOUNTER — Encounter: Payer: Self-pay | Admitting: Vascular Surgery

## 2016-12-11 ENCOUNTER — Ambulatory Visit (HOSPITAL_COMMUNITY)
Admission: RE | Admit: 2016-12-11 | Discharge: 2016-12-11 | Disposition: A | Discharge status: Home / Self Care | Payer: Medicare Other | Source: Ambulatory Visit | Attending: Vascular Surgery | Admitting: Vascular Surgery

## 2016-12-11 ENCOUNTER — Encounter: Payer: Self-pay | Admitting: Vascular Surgery

## 2016-12-11 ENCOUNTER — Ambulatory Visit (INDEPENDENT_AMBULATORY_CARE_PROVIDER_SITE_OTHER): Payer: Medicare Other | Admitting: Vascular Surgery

## 2016-12-11 VITALS — BP 157/92 | HR 53 | Temp 97.0°F | Resp 20 | Ht 70.0 in | Wt 229.7 lb

## 2016-12-11 DIAGNOSIS — I83891 Varicose veins of right lower extremities with other complications: Secondary | ICD-10-CM | POA: Diagnosis not present

## 2016-12-11 NOTE — Progress Notes (Signed)
Subjective:     Patient ID: Daniel Guerrero, male   DOB: [birth year], 71 y.o.   MRN: [id number]  HPI This 71 year old male returns 1 week post-laser ablation right great saphenous vein performed for gross reflux with history of 2 episodes of bleeding from a varicosity. His biggest complaint has been blistering of the skin along the course of the great saphenous vein in the thigh which she thinks is caused by the compression stocking. He has improved but continues to be uncomfortable. He did take ibuprofen as instructed.  Past Medical History:  Diagnosis Date  . Allergy   . Cataract of both eyes    implants both eyes   . Eczema   . Enlarged aorta ([place])   . Erectile dysfunction   . Hearing loss    high frequency  . History of inguinal hernia repair, bilateral   . Hypertension   . Kidney stones   . Loss of hearing    high frequency  . Meniere disease   . Obesity   . Paroxysmal SVT (supraventricular tachycardia) ([place])   . Psoriasis   . S/P appendectomy   . S/P tonsillectomy   . Seasonal allergies   . Type II or unspecified type diabetes mellitus without mention of complication, uncontrolled    12-29-14 Told he was prediabetic once    Social History  Substance Use Topics  . Smoking status: Former Smoker    Quit date: 07/24/1964  . Smokeless tobacco: Never Used     Comment: Quit 20-30 years ago  . Alcohol use 25.2 oz/week    7 Glasses of wine, 28 Cans of beer, 7 Shots of liquor per week     Comment: occasional    Family History  Problem Relation Age of Onset  . Cancer Mother        lung  . Thyroid disease Mother   . Diabetes Father   . GI Bleed Father   . Aneurysm Father   . AAA (abdominal aortic aneurysm) Father   . Colon cancer Neg Hx   . Colon polyps Neg Hx   . Rectal cancer Neg Hx   . Stomach cancer Neg Hx     Allergies  Allergen Reactions  . Peanut-Containing Drug Products Hives  . Penicillins Hives    Has patient had a PCN reaction causing immediate rash,  facial/tongue/throat swelling, SOB or lightheadedness with hypotension: No Has patient had a PCN reaction causing severe rash involving mucus membranes or skin necrosis: No Has patient had a PCN reaction that required hospitalization No Has patient had a PCN reaction occurring within the last 10 years: No If all of the above answers are "NO", then may proceed with Cephalosporin use.     Current Outpatient Prescriptions:  .  aspirin 81 MG chewable tablet, Chew 81 mg by mouth daily., Disp: , Rfl:  .  b complex vitamins tablet, Take 1 tablet by mouth daily., Disp: , Rfl:  .  Cyanocobalamin (VITAMIN B-12 PO), Take 1 tablet by mouth daily., Disp: , Rfl:  .  fluticasone (FLONASE) 50 MCG/ACT nasal spray, Place 2 sprays into both nostrils daily as needed for allergies or rhinitis., Disp: , Rfl:  .  lansoprazole (PREVACID) 30 MG capsule, Take 1 capsule by mouth daily., Disp: , Rfl: 5 .  loratadine (CLARITIN) 10 MG tablet, Take 10 mg by mouth daily as needed for allergies., Disp: , Rfl:  .  triamcinolone ointment (KENALOG) 0.5 %, Apply 1 application topically 2 (two) times daily. (Patient  taking differently: Apply 1 application topically 2 (two) times daily as needed. ), Disp: 30 g, Rfl: 0 .  vitamin C (ASCORBIC ACID) 500 MG tablet, Take 500 mg by mouth daily., Disp: , Rfl:   Vitals:   12/11/16 1352  BP: (!) 157/92  Pulse: (!) 53  Resp: 20  Temp: 97 F (36.1 C)  TempSrc: Oral  SpO2: 96%  Weight: 229 lb 11.2 oz (104.2 kg)  Height: 5\' 10"  (1.778 m)    Body mass index is 32.96 kg/m.         Review of Systems Denies chest pain, dyspnea on exertion, PND, orthopnea, hemoptysis    Objective:   Physical Exam BP (!) 157/92 (BP Location: Left Arm, Patient Position: Sitting, Cuff Size: Normal)   Pulse (!) 53   Temp 97 F (36.1 C) (Oral)   Resp 20   Ht 5\' 10"  (1.778 m)   Wt 229 lb 11.2 oz (104.2 kg)   SpO2 96%   BMI 32.96 kg/m   Gen. well-developed well-nourished male no apparent  distress alert and oriented 3 Lungs no rhonchi or wheezing Right leg with mild ecchymosis along course of great saphenous vein in proximal and medial thigh. Evidence of healed blistering along course of great saphenous vein and thigh up to 5 mm in width and 4 cm in length. No evidence of infection or cellulitis. Trace distal edema noted. Area of previous bleeding near right medial malleolus is not ulcerated. 2+ dorsalis pedis pulse palpable.  Today I ordered a venous duplex exam the right leg which I reviewed and interpreted. There is no DVT. There is total closure of the right great saphenous vein from the proximal calf to near the saphenofemoral junction     Assessment:     Successful laser ablation right great saphenous vein for gross reflux with history of bleeding varix and right ankle on 2 occasions    Plan:     After blistering and thigh heals over the next 4-6 weeks patient will have one session of foam sclerotherapy by Thea Silversmith to complete his treatment regimen and further decreased likelihood of recurrent bleeding

## 2016-12-18 ENCOUNTER — Encounter (HOSPITAL_COMMUNITY): Payer: Self-pay | Admitting: *Deleted

## 2016-12-18 ENCOUNTER — Emergency Department (HOSPITAL_COMMUNITY)
Admission: EM | Admit: 2016-12-18 | Discharge: 2016-12-18 | Disposition: A | Discharge status: Home / Self Care | Payer: Medicare Other | Attending: Emergency Medicine | Admitting: Emergency Medicine

## 2016-12-18 ENCOUNTER — Emergency Department (HOSPITAL_COMMUNITY): Payer: Medicare Other

## 2016-12-18 DIAGNOSIS — R519 Headache, unspecified: Secondary | ICD-10-CM

## 2016-12-18 DIAGNOSIS — M542 Cervicalgia: Secondary | ICD-10-CM | POA: Insufficient documentation

## 2016-12-18 DIAGNOSIS — Z87891 Personal history of nicotine dependence: Secondary | ICD-10-CM | POA: Insufficient documentation

## 2016-12-18 DIAGNOSIS — Z79899 Other long term (current) drug therapy: Secondary | ICD-10-CM | POA: Diagnosis not present

## 2016-12-18 DIAGNOSIS — I10 Essential (primary) hypertension: Secondary | ICD-10-CM | POA: Insufficient documentation

## 2016-12-18 DIAGNOSIS — W57XXXA Bitten or stung by nonvenomous insect and other nonvenomous arthropods, initial encounter: Secondary | ICD-10-CM | POA: Insufficient documentation

## 2016-12-18 DIAGNOSIS — Y929 Unspecified place or not applicable: Secondary | ICD-10-CM | POA: Diagnosis not present

## 2016-12-18 DIAGNOSIS — Y939 Activity, unspecified: Secondary | ICD-10-CM | POA: Diagnosis not present

## 2016-12-18 DIAGNOSIS — E119 Type 2 diabetes mellitus without complications: Secondary | ICD-10-CM | POA: Diagnosis not present

## 2016-12-18 DIAGNOSIS — Z7982 Long term (current) use of aspirin: Secondary | ICD-10-CM | POA: Diagnosis not present

## 2016-12-18 DIAGNOSIS — S0096XA Insect bite (nonvenomous) of unspecified part of head, initial encounter: Secondary | ICD-10-CM | POA: Diagnosis not present

## 2016-12-18 DIAGNOSIS — S1096XA Insect bite of unspecified part of neck, initial encounter: Secondary | ICD-10-CM | POA: Diagnosis not present

## 2016-12-18 DIAGNOSIS — R51 Headache: Secondary | ICD-10-CM

## 2016-12-18 DIAGNOSIS — Y999 Unspecified external cause status: Secondary | ICD-10-CM | POA: Insufficient documentation

## 2016-12-18 LAB — BASIC METABOLIC PANEL
Anion gap: 8 (ref 5–15)
BUN: 18 mg/dL (ref 6–20)
CALCIUM: 8.7 mg/dL — AB (ref 8.9–10.3)
CHLORIDE: 105 mmol/L (ref 101–111)
CO2: 26 mmol/L (ref 22–32)
CREATININE: 1.01 mg/dL (ref 0.61–1.24)
Glucose, Bld: 101 mg/dL — ABNORMAL HIGH (ref 65–99)
Potassium: 3.8 mmol/L (ref 3.5–5.1)
Sodium: 139 mmol/L (ref 135–145)

## 2016-12-18 LAB — CBC
HCT: 42.5 % (ref 39.0–52.0)
HEMOGLOBIN: 14.9 g/dL (ref 13.0–17.0)
MCH: 32.7 pg (ref 26.0–34.0)
MCHC: 35.1 g/dL (ref 30.0–36.0)
MCV: 93.2 fL (ref 78.0–100.0)
PLATELETS: 170 10*3/uL (ref 150–400)
RBC: 4.56 MIL/uL (ref 4.22–5.81)
RDW: 12.9 % (ref 11.5–15.5)
WBC: 7.2 10*3/uL (ref 4.0–10.5)

## 2016-12-18 MED ORDER — IBUPROFEN 400 MG PO TABS: 400.0000 mg | ORAL_TABLET | Freq: Four times a day (QID) | ORAL | 0 refills | Status: DC | PRN

## 2016-12-18 MED ORDER — DOXYCYCLINE HYCLATE 100 MG PO CAPS: 100.0000 mg | ORAL_CAPSULE | Freq: Two times a day (BID) | ORAL | 0 refills | Status: DC

## 2016-12-18 MED ORDER — ACETAMINOPHEN 325 MG PO TABS
650.0000 mg | ORAL_TABLET | Freq: Once | ORAL | Status: AC
  Administered 2016-12-18: 650 mg via ORAL
  Filled 2016-12-18: qty 2

## 2016-12-18 NOTE — ED Triage Notes (Signed)
Pt with right sided neck pain that felt muscular per pt but pain has radiated up from base of neck to back of head.  Denies light sensitivity or N/V.  Pt did mention tick removed over 2 weeks ago and another the other day.

## 2016-12-18 NOTE — Discharge Instructions (Signed)
Start taking the doxycycline if he started to experience any rash, fever, chills, flulike illness or other symptoms we discussed. Follow up with your doctor if headache persists.

## 2016-12-18 NOTE — ED Provider Notes (Signed)
[facility] DEPT Provider Note   CSN: [id number] Arrival date & time: 12/18/16  1643 By signing my name below, I, Daniel Guerrero, attest that this documentation has been prepared under the direction and in the presence of [name], MD . Electronically Signed: Dyke Guerrero, Scribe. 12/18/2016. 9:24 PM.   History   Chief Complaint Chief Complaint  Patient presents with  . Headache   HPI Daniel Guerrero is a 71 y.o. male who presents to the Emergency Department complaining of constant, progressively worsening pain to occipital head which began yesterday and significantly worsened today Pt describes his HA yesterday as spasmatic and states it felt "like a muscle". Per pt, his headache is now generalized and the pain is more severe. Pt took a muscle relaxer yesterday with no significant relief; no treatments tried today. He reports associated posterior neck pain which is exacerbated by direct palpation. Pt reports having two tick bites recently, and is unsure if this caused his headaches. He denies any fever, nausea, vomiting, neck stiffness, photophobia, or any other associated symptoms.   The history is provided by the patient. No language interpreter was used.   Past Medical History:  Diagnosis Date  . Allergy   . Cataract of both eyes    implants both eyes   . Eczema   . Enlarged aorta ([place])   . Erectile dysfunction   . Hearing loss    high frequency  . History of inguinal hernia repair, bilateral   . Hypertension   . Kidney stones   . Loss of hearing    high frequency  . Meniere disease   . Obesity   . Paroxysmal SVT (supraventricular tachycardia) ([place])   . Psoriasis   . S/P appendectomy   . S/P tonsillectomy   . Seasonal allergies   . Type II or unspecified type diabetes mellitus without mention of complication, uncontrolled    12-29-14 Told he was prediabetic once    Patient Active Problem List   Diagnosis Date Noted  . Varicose veins of right lower extremity with  complications 18/84/1660  . Hyperglycemia 04/14/2016  . Hyperlipidemia 04/14/2016  . Sinus bradycardia 06/10/2015  . Troponin level elevated   . Asthma with acute exacerbation 11/09/2014  . Benign paroxysmal positional vertigo 04/16/2014  . LLQ pain 01/15/2014  . Hypertension   . Eczema   . Erectile dysfunction   . History of inguinal hernia repair, bilateral   . Paroxysmal SVT (supraventricular tachycardia) ([place]) 11/11/2012  . Headache, common migraine, intractable 09/09/2010  . DYSPHAGIA 08/04/2010  . Allergic rhinitis 08/01/2010  . Overweight 12/13/2007  . MIXED HEARING LOSS BILATERAL 12/13/2007  . CONTACT DERMATITIS&OTH ECZEMA DUE [facility] AGENT 12/13/2007  . PSORIASIS 12/13/2007    Past Surgical History:  Procedure Laterality Date  . Ablasion    . APPENDECTOMY    . COLONOSCOPY     unsure where or when- states greater than 10 yrs ago per pt- was normal per pt and his wife   . CYSTOSCOPY WITH RETROGRADE PYELOGRAM, URETEROSCOPY AND STENT PLACEMENT Left 12/30/2014   Procedure: CYSTOSCOPY WITH LEFT URETEROSCOPY STONE EXTRACTION WITH STENT;  Surgeon: Franchot Gallo, MD;  Location: [facility];  Service: Urology;  Laterality: Left;  . ELECTROPHYSIOLOGIC STUDY N/A 08/24/2015   Procedure: SVT Ablation;  Surgeon: Thompson Grayer, MD;  Location: [facility];  Service: Cardiovascular;  Laterality: N/A;  . ENDOVENOUS ABLATION SAPHENOUS VEIN W/ LASER Right 12/04/2016   endovenous laser ablation right greater saphenous vein by Tinnie Gens MD   .  ESOPHAGOGASTRODUODENOSCOPY N/A 08/18/2016   Procedure: ESOPHAGOGASTRODUODENOSCOPY (EGD);  Surgeon: [name], MD;  Location: [facility];  Service: Endoscopy;  Laterality: N/A;  . ESOPHAGOGASTRODUODENOSCOPY N/A 09/06/2016   Procedure: ESOPHAGOGASTRODUODENOSCOPY (EGD);  Surgeon: [name], MD;  Location: [facility];  Service: Endoscopy;  Laterality: N/A;  100 - moved to 2/14 @ 2:15 per Tretha Sciara  . EYE SURGERY     Catracts removed both  eye 30 yrs ago  . HERNIA REPAIR     right and left  . HOLMIUM LASER APPLICATION Left 10/24/3534   Procedure: HOLMIUM LASER APPLICATION;  Surgeon: Franchot Gallo, MD;  Location: [facility];  Service: Urology;  Laterality: Left;  Marland Kitchen MALONEY DILATION N/A 09/06/2016   Procedure: Venia Minks DILATION;  Surgeon: [name], MD;  Location: [facility];  Service: Endoscopy;  Laterality: N/A;  . TONSILLECTOMY         Home Medications    Prior to Admission medications   Medication Sig Start Date End Date Taking? Authorizing Provider  aspirin 81 MG chewable tablet Chew 81 mg by mouth daily.    [provider]  b complex vitamins tablet Take 1 tablet by mouth daily.    [provider]  Cyanocobalamin (VITAMIN B-12 PO) Take 1 tablet by mouth daily.    [provider]  doxycycline (VIBRAMYCIN) 100 MG capsule Take 1 capsule (100 mg total) by mouth 2 (two) times daily. 12/18/16   [name], MD  fluticasone [facility]) 50 MCG/ACT nasal spray Place 2 sprays into both nostrils daily as needed for allergies or rhinitis.    [provider]  ibuprofen (ADVIL,MOTRIN) 400 MG tablet Take 1 tablet (400 mg total) by mouth every 6 (six) hours as needed. 12/18/16   [name], MD  lansoprazole (PREVACID) 30 MG capsule Take 1 capsule by mouth daily. 08/21/16   [provider]  loratadine (CLARITIN) 10 MG tablet Take 10 mg by mouth daily as needed for allergies.    [provider]  triamcinolone ointment (KENALOG) 0.5 % Apply 1 application topically 2 (two) times daily. Patient taking differently: Apply 1 application topically 2 (two) times daily as needed.  04/14/16   Nafziger, [name], NP  vitamin C (ASCORBIC ACID) 500 MG tablet Take 500 mg by mouth daily.    [provider]    Family History Family History  Problem Relation Age of Onset  . Cancer Mother        lung  . Thyroid disease Mother   . Diabetes Father   . GI Bleed Father   . Aneurysm Father   .  AAA (abdominal aortic aneurysm) Father   . Colon cancer Neg Hx   . Colon polyps Neg Hx   . Rectal cancer Neg Hx   . Stomach cancer Neg Hx     Social History Social History  Substance Use Topics  . Smoking status: Former Smoker    Quit date: 07/24/1964  . Smokeless tobacco: Never Used     Comment: Quit 20-30 years ago  . Alcohol use 25.2 oz/week    7 Glasses of wine, 28 Cans of beer, 7 Shots of liquor per week     Comment: occasional    Allergies   Peanut-containing drug products and Penicillins   Review of Systems Review of Systems  Constitutional: Negative for fever.  Eyes: Negative for photophobia.  Gastrointestinal: Negative for nausea and vomiting.  Musculoskeletal: Positive for neck pain. Negative for neck stiffness.  Neurological: Positive for headaches.  All other systems reviewed and are negative.  Physical Exam Updated Vital Signs BP (!) 165/102 (BP Location: Right Arm)   Pulse 75   Temp 98.3 F (36.8 C) (Oral)   Resp 16   Ht 1.778 m (5\' 10" )   Wt 99.8 kg (220 lb)   SpO2 97%   BMI 31.57 kg/m   Physical Exam  Constitutional: No distress.  Elderly  HENT:  Head: Normocephalic and atraumatic.  Right Ear: External ear normal.  Left Ear: External ear normal.  Eyes: Conjunctivae are normal. Right eye exhibits no discharge. Left eye exhibits no discharge. No scleral icterus.  Neck: Neck supple. No tracheal deviation present.  Cardiovascular: Normal rate, regular rhythm and intact distal pulses.   Pulmonary/Chest: Effort normal and breath sounds normal. No stridor. No respiratory distress. He has no wheezes. He has no rales.  Abdominal: Soft. Bowel sounds are normal. He exhibits no distension. There is no tenderness. There is no rebound and no guarding.  Musculoskeletal: He exhibits no edema or tenderness.  Neurological: He is alert. He has normal strength. No cranial nerve deficit (no facial droop, extraocular movements intact, no slurred speech) or sensory  deficit. He exhibits normal muscle tone. He displays no seizure activity. Coordination normal.  Skin: Skin is warm and dry. No rash noted.  Psychiatric: He has a normal mood and affect.  Nursing note and vitals reviewed.   ED Treatments / Results  DIAGNOSTIC STUDIES:  Oxygen Saturation is 97% on RA, normal by my interpretation.    COORDINATION OF CARE:  9:20 PM Will order CT head. Discussed treatment plan with pt at bedside and pt agreed to plan.   Labs (all labs ordered are listed, but only abnormal results are displayed) Labs Reviewed  BASIC METABOLIC PANEL - Abnormal; Notable for the following:       Result Value   Glucose, Bld 101 (*)    Calcium 8.7 (*)    All other components within normal limits  CBC    Radiology Ct Head Wo Contrast  Result Date: 12/18/2016 CLINICAL DATA:  Constant progressive occipital headache beginning yesterday. History of hypertension, hyperlipidemia, migraine. EXAM: CT HEAD WITHOUT CONTRAST TECHNIQUE: Contiguous axial images were obtained from the base of the skull through the vertex without intravenous contrast. COMPARISON:  MRI of the head Dec 02, 2010 FINDINGS: BRAIN: No intraparenchymal hemorrhage, mass effect nor midline shift. The ventricles and sulci are normal for age. Minimal supratentorial white matter hypodensities less than expected for patient's age, though non-specific are most compatible with chronic small vessel ischemic disease. No acute large vascular territory infarcts. No abnormal extra-axial fluid collections. Basal cisterns are patent. VASCULAR: Mild calcific atherosclerosis of the carotid siphons. SKULL: No skull fracture. No significant scalp soft tissue swelling. SINUSES/ORBITS: The mastoid air-cells and included paranasal sinuses are well-aerated.The included ocular globes and orbital contents are non-suspicious. Status post bilateral ocular lens implants. OTHER: None. IMPRESSION: Normal noncontrast CT HEAD for age. Electronically  Signed   By: Elon Alas M.D.   On: 12/18/2016 22:20    Procedures Procedures (including critical care time)  Medications Ordered in ED Medications  acetaminophen (TYLENOL) tablet 650 mg (650 mg Oral Given 12/18/16 2121)     Initial Impression / Assessment and Plan / ED Course  I have reviewed the triage vital signs and the nursing notes.  Pertinent labs & imaging results that were available during my care of the patient were reviewed by me and considered in my medical decision making (see chart  for details).   patient presented to the emergency room with complaints of persistent headache. Laboratory tests and CT scan are reassuring. She also mentioned a tick bite. He denies any other constitutional symptoms. He would prefer not to take antibiotics unless absolutely necessary. I have a low suspicion for [facility] mountain spotted fever at this time but I explained to the patient I will give him a prescription for doxycycline. If he starts having any other constitutional symptoms he should start taking the doxycycline Final Clinical Impressions(s) / ED Diagnoses   Final diagnoses:  Acute nonintractable headache, unspecified headache type  Tick bite, initial encounter    New Prescriptions New Prescriptions   DOXYCYCLINE (VIBRAMYCIN) 100 MG CAPSULE    Take 1 capsule (100 mg total) by mouth 2 (two) times daily.   IBUPROFEN (ADVIL,MOTRIN) 400 MG TABLET    Take 1 tablet (400 mg total) by mouth every 6 (six) hours as needed.   I personally performed the services described in this documentation, which was scribed in my presence.  The recorded information has been reviewed and is accurate.     [name], MD 12/18/16 2253

## 2016-12-18 NOTE — ED Notes (Signed)
Error in charting on wrong patient from times 1734 til 1743.

## 2016-12-19 ENCOUNTER — Ambulatory Visit (INDEPENDENT_AMBULATORY_CARE_PROVIDER_SITE_OTHER): Payer: Medicare Other | Admitting: Adult Health

## 2016-12-19 ENCOUNTER — Encounter: Payer: Self-pay | Admitting: Adult Health

## 2016-12-19 VITALS — BP 152/88 | Ht 70.0 in | Wt 228.7 lb

## 2016-12-19 DIAGNOSIS — I10 Essential (primary) hypertension: Secondary | ICD-10-CM

## 2016-12-19 DIAGNOSIS — G44201 Tension-type headache, unspecified, intractable: Secondary | ICD-10-CM

## 2016-12-19 DIAGNOSIS — R51 Headache: Secondary | ICD-10-CM

## 2016-12-19 DIAGNOSIS — R519 Headache, unspecified: Secondary | ICD-10-CM

## 2016-12-19 HISTORY — DX: Headache, unspecified: R51.9

## 2016-12-19 MED ORDER — AMLODIPINE BESYLATE 5 MG PO TABS: 5.0000 mg | ORAL_TABLET | Freq: Every day | ORAL | 3 refills | Status: DC

## 2016-12-19 NOTE — Patient Instructions (Signed)
I have prescribed a low dose Norvasc for your blood pressure. I would like you to follow up with me in 2 weeks to monitor your blood pressure   Start the doxycycline just to be safe

## 2016-12-19 NOTE — Progress Notes (Signed)
Subjective:    Patient ID: Daniel Guerrero, male    DOB: 1945/10/23, 71 y.o.   MRN: [id number]  HPI  71 year old male who  has a past medical history of Allergy; Cataract of both eyes; Eczema; Enlarged aorta ([place]); Erectile dysfunction; Hearing loss; History of inguinal hernia repair, bilateral; Hypertension; Kidney stones; Loss of hearing; Meniere disease; Obesity; Paroxysmal SVT (supraventricular tachycardia) ([place]); Psoriasis; S/P appendectomy; S/P tonsillectomy; Seasonal allergies; and Type II or unspecified type diabetes mellitus without mention of complication, uncontrolled.   He presents today for follow up after being seen in the ER yesterday for sudden onset of headache. Per ER note :   [name] is a 71 y.o. male who presents to the Emergency Department complaining of constant, progressively worsening pain to occipital head which began yesterday and significantly worsened today Pt describes his HA yesterday as spasmatic and states it felt "like a muscle". Per pt, his headache is now generalized and the pain is more severe. Pt took a muscle relaxer yesterday with no significant relief; no treatments tried today. He reports associated posterior neck pain which is exacerbated by direct palpation. Pt reports having two tick bites recently, and is unsure if this caused his headaches. He denies any fever, nausea, vomiting, neck stiffness, photophobia, or any other associated symptoms.   Labs and CT scan were reassuring. He was prescribed a course of Doxycycline although a low suspicion for RMSF. He has not started the doxycycline.   He reports taking two aleve this morning and he feels " vastly improved". He continues to have slight pain with ROM exercises but feel as though this is improving.   He denies any blurred vision, chest pain, or shortness of breath   Past Medical History:  Diagnosis Date  . Allergy   . Cataract of both eyes    implants both eyes   . Eczema   .  Enlarged aorta ([place])   . Erectile dysfunction   . Hearing loss    high frequency  . History of inguinal hernia repair, bilateral   . Hypertension   . Kidney stones   . Loss of hearing    high frequency  . Meniere disease   . Obesity   . Paroxysmal SVT (supraventricular tachycardia) ([place])   . Psoriasis   . S/P appendectomy   . S/P tonsillectomy   . Seasonal allergies   . Type II or unspecified type diabetes mellitus without mention of complication, uncontrolled    12-29-14 Told he was prediabetic once    Social History   Social History  . Marital status: Married    Spouse name: N/A  . Number of children: N/A  . Years of education: N/A   Occupational History  . Not on file.   Social History Main Topics  . Smoking status: Former Smoker    Quit date: 07/24/1964  . Smokeless tobacco: Never Used     Comment: Quit 20-30 years ago  . Alcohol use 25.2 oz/week    7 Glasses of wine, 28 Cans of beer, 7 Shots of liquor per week     Comment: occasional  . Drug use: No  . Sexual activity: Not on file   Other Topics Concern  . Not on file   Social History Narrative   Daily Caffeine use: 5-6 drinks daily   Exercising 2 times per week   No Drug use             Past Surgical  History:  Procedure Laterality Date  . Ablasion    . APPENDECTOMY    . COLONOSCOPY     unsure where or when- states greater than 10 yrs ago per pt- was normal per pt and his wife   . CYSTOSCOPY WITH RETROGRADE PYELOGRAM, URETEROSCOPY AND STENT PLACEMENT Left 12/30/2014   Procedure: CYSTOSCOPY WITH LEFT URETEROSCOPY STONE EXTRACTION WITH STENT;  Surgeon: Franchot Gallo, MD;  [facility]: [facility];  Service: Urology;  Laterality: Left;  . ELECTROPHYSIOLOGIC STUDY N/A 08/24/2015   Procedure: SVT Ablation;  Surgeon: Thompson Grayer, MD;  Location: [facility];  Service: Cardiovascular;  Laterality: N/A;  . ENDOVENOUS ABLATION SAPHENOUS VEIN W/ LASER Right 12/04/2016   endovenous laser ablation right greater  saphenous vein by Tinnie Gens MD   . ESOPHAGOGASTRODUODENOSCOPY N/A 08/18/2016   Procedure: ESOPHAGOGASTRODUODENOSCOPY (EGD);  Surgeon: [name], MD;  [facility];  Service: Endoscopy;  Laterality: N/A;  . ESOPHAGOGASTRODUODENOSCOPY N/A 09/06/2016   Procedure: ESOPHAGOGASTRODUODENOSCOPY (EGD);  Surgeon: [name], MD;  [facility];  Service: Endoscopy;  Laterality: N/A;  100 - moved to 2/14 @ 2:15 per Tretha Sciara  . EYE SURGERY     Catracts removed both eye 30 yrs ago  . HERNIA REPAIR     right and left  . HOLMIUM LASER APPLICATION Left 02/22/4234   Procedure: HOLMIUM LASER APPLICATION;  Surgeon: Franchot Gallo, MD;  [facility]: [facility];  Service: Urology;  Laterality: Left;  Marland Kitchen MALONEY DILATION N/A 09/06/2016   Procedure: Venia Minks DILATION;  Surgeon: [name], MD;  [facility];  Service: Endoscopy;  Laterality: N/A;  . TONSILLECTOMY      Family History  Problem Relation Age of Onset  . Cancer Mother        lung  . Thyroid disease Mother   . Diabetes Father   . GI Bleed Father   . Aneurysm Father   . AAA (abdominal aortic aneurysm) Father   . Colon cancer Neg Hx   . Colon polyps Neg Hx   . Rectal cancer Neg Hx   . Stomach cancer Neg Hx     Allergies  Allergen Reactions  . Peanut-Containing Drug Products Hives  . Penicillins Hives    Has patient had a PCN reaction causing immediate rash, facial/tongue/throat swelling, SOB or lightheadedness with hypotension: No Has patient had a PCN reaction causing severe rash involving mucus membranes or skin necrosis: No Has patient had a PCN reaction that required hospitalization No Has patient had a PCN reaction occurring within the last 10 years: No If all of the above answers are "NO", then may proceed with Cephalosporin use.    Current Outpatient Prescriptions on File Prior to Visit  Medication Sig Dispense Refill  . aspirin 81 MG chewable tablet Chew 81 mg by mouth daily.    Marland Kitchen b complex  vitamins tablet Take 1 tablet by mouth daily.    . Cyanocobalamin (VITAMIN B-12 PO) Take 1 tablet by mouth daily.    Marland Kitchen doxycycline (VIBRAMYCIN) 100 MG capsule Take 1 capsule (100 mg total) by mouth 2 (two) times daily. 20 capsule 0  . fluticasone (FLONASE) 50 MCG/ACT nasal spray Place 2 sprays into both nostrils daily as needed for allergies or rhinitis.    Marland Kitchen ibuprofen (ADVIL,MOTRIN) 400 MG tablet Take 1 tablet (400 mg total) by mouth every 6 (six) hours as needed. 30 tablet 0  . lansoprazole (PREVACID) 30 MG capsule Take 1 capsule by mouth daily.  5  .  loratadine (CLARITIN) 10 MG tablet Take 10 mg by mouth daily as needed for allergies.    Marland Kitchen triamcinolone ointment (KENALOG) 0.5 % Apply 1 application topically 2 (two) times daily. (Patient taking differently: Apply 1 application topically 2 (two) times daily as needed. ) 30 g 0  . vitamin C (ASCORBIC ACID) 500 MG tablet Take 500 mg by mouth daily.     No current facility-administered medications on file prior to visit.     BP (!) 152/88 (BP Location: Right Arm, Patient Position: Sitting, Cuff Size: Normal)   Ht 5\' 10"  (1.778 m)   Wt 228 lb 11.2 oz (103.7 kg)   BMI 32.82 kg/m     Review of Systems  Constitutional: Negative.   Respiratory: Negative.   Cardiovascular: Negative.   Gastrointestinal: Negative.   Genitourinary: Negative.   Musculoskeletal: Positive for myalgias.  Neurological: Positive for headaches.  All other systems reviewed and are negative.  Past Medical History:  Diagnosis Date  . Allergy   . Cataract of both eyes    implants both eyes   . Eczema   . Enlarged aorta ([place])   . Erectile dysfunction   . Hearing loss    high frequency  . History of inguinal hernia repair, bilateral   . Hypertension   . Kidney stones   . Loss of hearing    high frequency  . Meniere disease   . Obesity   . Paroxysmal SVT (supraventricular tachycardia) ([place])   . Psoriasis   . S/P appendectomy   . S/P tonsillectomy   .  Seasonal allergies   . Type II or unspecified type diabetes mellitus without mention of complication, uncontrolled    12-29-14 Told he was prediabetic once    Social History   Social History  . Marital status: Married    Spouse name: N/A  . Number of children: N/A  . Years of education: N/A   Occupational History  . Not on file.   Social History Main Topics  . Smoking status: Former Smoker    Quit date: 07/24/1964  . Smokeless tobacco: Never Used     Comment: Quit 20-30 years ago  . Alcohol use 25.2 oz/week    7 Glasses of wine, 28 Cans of beer, 7 Shots of liquor per week     Comment: occasional  . Drug use: No  . Sexual activity: Not on file   Other Topics Concern  . Not on file   Social History Narrative   Daily Caffeine use: 5-6 drinks daily   Exercising 2 times per week   No Drug use             Past Surgical History:  Procedure Laterality Date  . Ablasion    . APPENDECTOMY    . COLONOSCOPY     unsure where or when- states greater than 10 yrs ago per pt- was normal per pt and his wife   . CYSTOSCOPY WITH RETROGRADE PYELOGRAM, URETEROSCOPY AND STENT PLACEMENT Left 12/30/2014   Procedure: CYSTOSCOPY WITH LEFT URETEROSCOPY STONE EXTRACTION WITH STENT;  Surgeon: Franchot Gallo, MD;  [facility];  Service: Urology;  Laterality: Left;  . ELECTROPHYSIOLOGIC STUDY N/A 08/24/2015   Procedure: SVT Ablation;  Surgeon: Thompson Grayer, MD;  Location: [facility];  Service: Cardiovascular;  Laterality: N/A;  . ENDOVENOUS ABLATION SAPHENOUS VEIN W/ LASER Right 12/04/2016   endovenous laser ablation right greater saphenous vein by Tinnie Gens MD   . ESOPHAGOGASTRODUODENOSCOPY N/A 08/18/2016   Procedure:  ESOPHAGOGASTRODUODENOSCOPY (EGD);  Surgeon: [name], MD;  [facility];  Service: Endoscopy;  Laterality: N/A;  . ESOPHAGOGASTRODUODENOSCOPY N/A 09/06/2016   Procedure: ESOPHAGOGASTRODUODENOSCOPY (EGD);  Surgeon: [name], MD;  [facility];  Service: Endoscopy;  Laterality: N/A;  100 - moved to 2/14 @ 2:15 per Tretha Sciara  . EYE SURGERY     Catracts removed both eye 30 yrs ago  . HERNIA REPAIR     right and left  . HOLMIUM LASER APPLICATION Left 09/26/4654   Procedure: HOLMIUM LASER APPLICATION;  Surgeon: Franchot Gallo, MD;  [facility];  Service: Urology;  Laterality: Left;  Marland Kitchen MALONEY DILATION N/A 09/06/2016   Procedure: Venia Minks DILATION;  Surgeon: [name], MD;  [facility];  Service: Endoscopy;  Laterality: N/A;  . TONSILLECTOMY      Family History  Problem Relation Age of Onset  . Cancer Mother        lung  . Thyroid disease Mother   . Diabetes Father   . GI Bleed Father   . Aneurysm Father   . AAA (abdominal aortic aneurysm) Father   . Colon cancer Neg Hx   . Colon polyps Neg Hx   . Rectal cancer Neg Hx   . Stomach cancer Neg Hx     Allergies  Allergen Reactions  . Peanut-Containing Drug Products Hives  . Penicillins Hives    Has patient had a PCN reaction causing immediate rash, facial/tongue/throat swelling, SOB or lightheadedness with hypotension: No Has patient had a PCN reaction causing severe rash involving mucus membranes or skin necrosis: No Has patient had a PCN reaction that required hospitalization No Has patient had a PCN reaction occurring within the last 10 years: No If all of the above answers are "NO", then may proceed with Cephalosporin use.    Current Outpatient Prescriptions on File Prior to Visit  Medication Sig Dispense Refill  . aspirin 81 MG chewable tablet Chew 81 mg by mouth daily.    Marland Kitchen b complex vitamins tablet Take 1 tablet by mouth daily.    . Cyanocobalamin (VITAMIN B-12 PO) Take 1 tablet by mouth daily.    Marland Kitchen doxycycline (VIBRAMYCIN) 100 MG capsule Take 1 capsule (100 mg total) by mouth 2 (two) times daily. 20 capsule 0  . fluticasone (FLONASE) 50 MCG/ACT nasal spray Place 2 sprays into both nostrils daily as needed for allergies or rhinitis.    Marland Kitchen  ibuprofen (ADVIL,MOTRIN) 400 MG tablet Take 1 tablet (400 mg total) by mouth every 6 (six) hours as needed. 30 tablet 0  . lansoprazole (PREVACID) 30 MG capsule Take 1 capsule by mouth daily.  5  . loratadine (CLARITIN) 10 MG tablet Take 10 mg by mouth daily as needed for allergies.    Marland Kitchen triamcinolone ointment (KENALOG) 0.5 % Apply 1 application topically 2 (two) times daily. (Patient taking differently: Apply 1 application topically 2 (two) times daily as needed. ) 30 g 0  . vitamin C (ASCORBIC ACID) 500 MG tablet Take 500 mg by mouth daily.     No current facility-administered medications on file prior to visit.     BP (!) 152/88 (BP Location: Right Arm, Patient Position: Sitting, Cuff Size: Normal)   Ht 5\' 10"  (1.778 m)   Wt 228 lb 11.2 oz (103.7 kg)   BMI 32.82 kg/m       Objective:   Physical Exam  Constitutional: He is oriented to person, place, and time. He appears well-developed  and well-nourished. No distress.  Cardiovascular: Normal rate, regular rhythm, normal heart sounds and intact distal pulses.  Exam reveals no gallop.   No murmur heard. Pulmonary/Chest: Effort normal and breath sounds normal. No respiratory distress. He has no rales. He exhibits no tenderness.  Neurological: He is alert and oriented to person, place, and time.  Skin: Skin is warm and dry. No rash noted. He is not diaphoretic. No erythema. No pallor.  Psychiatric: He has a normal mood and affect. His behavior is normal. Judgment and thought content normal.  Nursing note and vitals reviewed.     Assessment & Plan:  1. Acute intractable tension-type headache - Take doxycycline as prescribed for prophylactic measures - Continue with Aleve  - Heating pad - Follow up if not resolved   2. Essential hypertension - His BP has been elevated during the last few office visits. I do not think this is contributing ot his headache but I am going to treat with Norvasc 5 mg  - amLODipine (NORVASC) 5 MG tablet;  Take 1 tablet (5 mg total) by mouth daily.  Dispense: 30 tablet; Refill: 3 - Follow up in 2 weeks   Dorothyann Peng, NP

## 2017-01-03 ENCOUNTER — Ambulatory Visit (INDEPENDENT_AMBULATORY_CARE_PROVIDER_SITE_OTHER): Payer: Medicare Other | Admitting: Adult Health

## 2017-01-03 ENCOUNTER — Encounter: Payer: Self-pay | Admitting: Adult Health

## 2017-01-03 VITALS — BP 136/74 | Temp 98.0°F | Ht 70.0 in | Wt 231.4 lb

## 2017-01-03 DIAGNOSIS — I10 Essential (primary) hypertension: Secondary | ICD-10-CM | POA: Diagnosis not present

## 2017-01-03 NOTE — Progress Notes (Signed)
Subjective:    Patient ID: Daniel Guerrero, male    DOB: 02-11-1946, 71 y.o.   MRN: [id number]  HPI  71 year old male who  has a past medical history of Allergy; Cataract of both eyes; Eczema; Enlarged aorta ([place]); Erectile dysfunction; Hearing loss; History of inguinal hernia repair, bilateral; Hypertension; Kidney stones; Loss of hearing; Meniere disease; Obesity; Paroxysmal SVT (supraventricular tachycardia) ([place]); Psoriasis; S/P appendectomy; S/P tonsillectomy; Seasonal allergies; and Type II or unspecified type diabetes mellitus without mention of complication, uncontrolled. He presents to the office today for two week follow up regarding essential hypertension. During his last visit he was started on 5 mg of Norvasc.   BP Readings from Last 3 Encounters:  01/03/17 136/74  12/19/16 (!) 152/88  12/18/16 (!) 162/80   He reports that he has not had any adverse reactions since starting this medication and reports " I am feeling really good"   Denies leg swelling, dizziness, or lightheadedness  Review of Systems See HPI   Past Medical History:  Diagnosis Date  . Allergy   . Cataract of both eyes    implants both eyes   . Eczema   . Enlarged aorta ([place])   . Erectile dysfunction   . Hearing loss    high frequency  . History of inguinal hernia repair, bilateral   . Hypertension   . Kidney stones   . Loss of hearing    high frequency  . Meniere disease   . Obesity   . Paroxysmal SVT (supraventricular tachycardia) ([place])   . Psoriasis   . S/P appendectomy   . S/P tonsillectomy   . Seasonal allergies   . Type II or unspecified type diabetes mellitus without mention of complication, uncontrolled    12-29-14 Told he was prediabetic once    Social History   Social History  . Marital status: Married    Spouse name: N/A  . Number of children: N/A  . Years of education: N/A   Occupational History  . Not on file.   Social History Main Topics  . Smoking status: Former  Smoker    Quit date: 07/24/1964  . Smokeless tobacco: Never Used     Comment: Quit 20-30 years ago  . Alcohol use 25.2 oz/week    7 Glasses of wine, 28 Cans of beer, 7 Shots of liquor per week     Comment: occasional  . Drug use: No  . Sexual activity: Not on file   Other Topics Concern  . Not on file   Social History Narrative   Daily Caffeine use: 5-6 drinks daily   Exercising 2 times per week   No Drug use             Past Surgical History:  Procedure Laterality Date  . Ablasion    . APPENDECTOMY    . COLONOSCOPY     unsure where or when- states greater than 10 yrs ago per pt- was normal per pt and his wife   . CYSTOSCOPY WITH RETROGRADE PYELOGRAM, URETEROSCOPY AND STENT PLACEMENT Left 12/30/2014   Procedure: CYSTOSCOPY WITH LEFT URETEROSCOPY STONE EXTRACTION WITH STENT;  Surgeon: Franchot Gallo, MD;  [facility];  Service: Urology;  Laterality: Left;  . ELECTROPHYSIOLOGIC STUDY N/A 08/24/2015   Procedure: SVT Ablation;  Surgeon: Thompson Grayer, MD;  Location: [facility];  Service: Cardiovascular;  Laterality: N/A;  . ENDOVENOUS ABLATION SAPHENOUS VEIN W/ LASER Right 12/04/2016   endovenous laser ablation right greater saphenous  vein by Tinnie Gens MD   . ESOPHAGOGASTRODUODENOSCOPY N/A 08/18/2016   Procedure: ESOPHAGOGASTRODUODENOSCOPY (EGD);  Surgeon: [name], MD;  [facility];  Service: Endoscopy;  Laterality: N/A;  . ESOPHAGOGASTRODUODENOSCOPY N/A 09/06/2016   Procedure: ESOPHAGOGASTRODUODENOSCOPY (EGD);  Surgeon: [name], MD;  [facility];  Service: Endoscopy;  Laterality: N/A;  100 - moved to 2/14 @ 2:15 per Tretha Sciara  . EYE SURGERY     Catracts removed both eye 30 yrs ago  . HERNIA REPAIR     right and left  . HOLMIUM LASER APPLICATION Left 0/09/90   Procedure: HOLMIUM LASER APPLICATION;  Surgeon: Franchot Gallo, MD;  [facility];  Service: Urology;  Laterality: Left;  Marland Kitchen MALONEY DILATION N/A 09/06/2016    Procedure: Venia Minks DILATION;  Surgeon: [name], MD;  [facility];  Service: Endoscopy;  Laterality: N/A;  . TONSILLECTOMY      Family History  Problem Relation Age of Onset  . Cancer Mother        lung  . Thyroid disease Mother   . Diabetes Father   . GI Bleed Father   . Aneurysm Father   . AAA (abdominal aortic aneurysm) Father   . Colon cancer Neg Hx   . Colon polyps Neg Hx   . Rectal cancer Neg Hx   . Stomach cancer Neg Hx     Allergies  Allergen Reactions  . Peanut-Containing Drug Products Hives  . Penicillins Hives    Has patient had a PCN reaction causing immediate rash, facial/tongue/throat swelling, SOB or lightheadedness with hypotension: No Has patient had a PCN reaction causing severe rash involving mucus membranes or skin necrosis: No Has patient had a PCN reaction that required hospitalization No Has patient had a PCN reaction occurring within the last 10 years: No If all of the above answers are "NO", then may proceed with Cephalosporin use.    Current Outpatient Prescriptions on File Prior to Visit  Medication Sig Dispense Refill  . amLODipine (NORVASC) 5 MG tablet Take 1 tablet (5 mg total) by mouth daily. 30 tablet 3  . aspirin 81 MG chewable tablet Chew 81 mg by mouth daily.    Marland Kitchen b complex vitamins tablet Take 1 tablet by mouth daily.    . Cyanocobalamin (VITAMIN B-12 PO) Take 1 tablet by mouth daily.    . fluticasone (FLONASE) 50 MCG/ACT nasal spray Place 2 sprays into both nostrils daily as needed for allergies or rhinitis.    Marland Kitchen ibuprofen (ADVIL,MOTRIN) 400 MG tablet Take 1 tablet (400 mg total) by mouth every 6 (six) hours as needed. 30 tablet 0  . lansoprazole (PREVACID) 30 MG capsule Take 1 capsule by mouth daily.  5  . loratadine (CLARITIN) 10 MG tablet Take 10 mg by mouth daily as needed for allergies.    Marland Kitchen triamcinolone ointment (KENALOG) 0.5 % Apply 1 application topically 2 (two) times daily. (Patient taking differently: Apply 1  application topically 2 (two) times daily as needed. ) 30 g 0  . vitamin C (ASCORBIC ACID) 500 MG tablet Take 500 mg by mouth daily.     No current facility-administered medications on file prior to visit.     BP 136/74 (BP Location: Left Arm, Patient Position: Sitting, Cuff Size: Normal)   Temp 98 F (36.7 C) (Oral)   Ht 5\' 10"  (1.778 m)   Wt 231 lb 6.4 oz (105 kg)   BMI 33.20 kg/m  Objective:   Physical Exam  Constitutional: He is oriented to person, place, and time. He appears well-developed and well-nourished. No distress.  Cardiovascular: Normal rate, regular rhythm, normal heart sounds and intact distal pulses.  Exam reveals no gallop and no friction rub.   No murmur heard. Pulmonary/Chest: Effort normal and breath sounds normal. No respiratory distress. He has no wheezes. He has no rales. He exhibits no tenderness.  Neurological: He is alert and oriented to person, place, and time.  Skin: Skin is warm and dry. No rash noted. He is not diaphoretic. No erythema. No pallor.  Psychiatric: He has a normal mood and affect. His behavior is normal. Judgment and thought content normal.  Nursing note and vitals reviewed.     Assessment & Plan:  1. Essential hypertension -Responding well to Norvasc  - Continue with current therapy  - Follow up as needed  Dorothyann Peng, NP

## 2017-01-29 ENCOUNTER — Encounter: Payer: Self-pay | Admitting: Internal Medicine

## 2017-01-31 ENCOUNTER — Encounter: Payer: Self-pay | Admitting: *Deleted

## 2017-02-07 ENCOUNTER — Ambulatory Visit: Payer: Medicare Other | Admitting: *Deleted

## 2017-02-14 ENCOUNTER — Ambulatory Visit (INDEPENDENT_AMBULATORY_CARE_PROVIDER_SITE_OTHER): Payer: Medicare Other | Admitting: *Deleted

## 2017-02-14 DIAGNOSIS — I83891 Varicose veins of right lower extremities with other complications: Secondary | ICD-10-CM

## 2017-02-14 DIAGNOSIS — I868 Varicose veins of other specified sites: Secondary | ICD-10-CM

## 2017-02-14 NOTE — Progress Notes (Signed)
X=.3% Sotradecol administered with a 27g butterfly.  Patient received a total of 6cc.  Treated area of prev bleeds on the right inner ankle. Easy access. Tol well. Anticipate clusure. Vein did not appear to be under high pressure. Follow prn.    Compression stockings applied: Yes.

## 2017-02-15 ENCOUNTER — Telehealth: Payer: Self-pay

## 2017-02-15 DIAGNOSIS — S61411A Laceration without foreign body of right hand, initial encounter: Secondary | ICD-10-CM | POA: Diagnosis not present

## 2017-02-15 NOTE — Telephone Encounter (Signed)
Patient called to report that he was working in the yard today and a branch cut his hand. He states that the area is about 2in long and appears to be about 1/8th in deep. He states that it has been difficult to stop the bleeding as each time he moves his hand it comes open again. Advised pt that we do not have any openings in clinic today. He is in [place] and asked for info on local UC center. Information given. Pt will have his wife take him to UC for evaluation and treatment. Nothing further needed at this time.

## 2017-03-01 DIAGNOSIS — S61411S Laceration without foreign body of right hand, sequela: Secondary | ICD-10-CM | POA: Diagnosis not present

## 2017-03-09 ENCOUNTER — Ambulatory Visit: Payer: Medicare Other | Admitting: Gastroenterology

## 2017-03-09 ENCOUNTER — Ambulatory Visit: Payer: Medicare Other | Admitting: Internal Medicine

## 2017-03-16 ENCOUNTER — Ambulatory Visit: Payer: Medicare Other | Admitting: Internal Medicine

## 2017-03-20 ENCOUNTER — Encounter: Payer: Self-pay | Admitting: Internal Medicine

## 2017-03-20 ENCOUNTER — Ambulatory Visit (INDEPENDENT_AMBULATORY_CARE_PROVIDER_SITE_OTHER): Payer: Medicare Other | Admitting: Internal Medicine

## 2017-03-20 VITALS — BP 153/92 | HR 64 | Temp 97.5°F | Ht 70.0 in | Wt 230.0 lb

## 2017-03-20 DIAGNOSIS — R1319 Other dysphagia: Secondary | ICD-10-CM

## 2017-03-20 DIAGNOSIS — K219 Gastro-esophageal reflux disease without esophagitis: Secondary | ICD-10-CM | POA: Diagnosis not present

## 2017-03-20 DIAGNOSIS — R1310 Dysphagia, unspecified: Secondary | ICD-10-CM

## 2017-03-20 MED ORDER — LANSOPRAZOLE 30 MG PO CPDR: 30.0000 mg | DELAYED_RELEASE_CAPSULE | Freq: Every day | ORAL | 3 refills | Status: DC

## 2017-03-20 NOTE — Patient Instructions (Signed)
GERD information  Continue lanzaprazole 30 mg daily  Office visit in 1 year  I'll check on details of last colonoscopy and let you know my recommendations

## 2017-03-20 NOTE — Progress Notes (Signed)
Primary Care Physician:  Dorothyann Peng, NP Primary Gastroenterologist:  Dr. Gala Romney  Pre-Procedure History & Physical: HPI:  Daniel Guerrero is a 71 y.o. male here for follow-up of dysphagia/GERD. History shot schedule ring dilated in February of this year. Prior to that session, he presented emergently to food impaction which I am removed. No reflux symptoms on lens 30 mg daily. He feels like he doesn't have reflux and does not necessarily take it every day. No abdominal pain noted lower GI tract symptoms. He reported a colonoscopy last year. Appetite records,. Dr. Henrene Pastor did indeed return about 6 polyps are all simple adenomas and one serrated polyp without dyspnea ablation. There are estimated be up to 5 mm in size-6 in number. So slated to return in 3 years for follow-up exam this procedure was done on 05/18/2016.  Past Medical History:  Diagnosis Date  . Allergy   . Cataract of both eyes    implants both eyes   . Eczema   . Enlarged aorta ([place])   . Erectile dysfunction   . Hearing loss    high frequency  . History of inguinal hernia repair, bilateral   . Hypertension   . Kidney stones   . Loss of hearing    high frequency  . Meniere disease   . Obesity   . Paroxysmal SVT (supraventricular tachycardia) ([place])   . Psoriasis   . S/P appendectomy   . S/P tonsillectomy   . Seasonal allergies   . Type II or unspecified type diabetes mellitus without mention of complication, uncontrolled    12-29-14 Told he was prediabetic once    Past Surgical History:  Procedure Laterality Date  . Ablasion    . APPENDECTOMY    . COLONOSCOPY     unsure where or when- states greater than 10 yrs ago per pt- was normal per pt and his wife   . CYSTOSCOPY WITH RETROGRADE PYELOGRAM, URETEROSCOPY AND STENT PLACEMENT Left 12/30/2014   Procedure: CYSTOSCOPY WITH LEFT URETEROSCOPY STONE EXTRACTION WITH STENT;  Surgeon: Franchot Gallo, MD;  Location: [facility];  Service: Urology;  Laterality: Left;  .  ELECTROPHYSIOLOGIC STUDY N/A 08/24/2015   Procedure: SVT Ablation;  Surgeon: Thompson Grayer, MD;  Location: [facility];  Service: Cardiovascular;  Laterality: N/A;  . ENDOVENOUS ABLATION SAPHENOUS VEIN W/ LASER Right 12/04/2016   endovenous laser ablation right greater saphenous vein by Tinnie Gens MD   . ESOPHAGOGASTRODUODENOSCOPY N/A 08/18/2016   Procedure: ESOPHAGOGASTRODUODENOSCOPY (EGD);  Surgeon: [name], MD;  [facility];  Service: Endoscopy;  Laterality: N/A;  . ESOPHAGOGASTRODUODENOSCOPY N/A 09/06/2016   Procedure: ESOPHAGOGASTRODUODENOSCOPY (EGD);  Surgeon: [name], MD;  [facility];  Service: Endoscopy;  Laterality: N/A;  100 - moved to 2/14 @ 2:15 per Tretha Sciara  . EYE SURGERY     Catracts removed both eye 30 yrs ago  . HERNIA REPAIR     right and left  . HOLMIUM LASER APPLICATION Left 12/23/8364   Procedure: HOLMIUM LASER APPLICATION;  Surgeon: Franchot Gallo, MD;  Location: [facility];  Service: Urology;  Laterality: Left;  Marland Kitchen MALONEY DILATION N/A 09/06/2016   Procedure: Venia Minks DILATION;  Surgeon: [name], MD;  [facility];  Service: Endoscopy;  Laterality: N/A;  . TONSILLECTOMY      Prior to Admission medications   Medication Sig Start Date End Date Taking? Authorizing Provider  amLODipine (NORVASC) 5 MG tablet Take 1 tablet (5 mg total) by mouth  daily. 12/19/16  Yes Nafziger, [name], NP  aspirin 81 MG chewable tablet Chew 81 mg by mouth daily.   Yes [provider]  b complex vitamins tablet Take 1 tablet by mouth daily.   Yes [provider]  Cyanocobalamin (VITAMIN B-12 PO) Take 1 tablet by mouth daily.   Yes [provider]  fluticasone (FLONASE) 50 MCG/ACT nasal spray Place 2 sprays into both nostrils daily as needed for allergies or rhinitis.   Yes [provider]  ibuprofen (ADVIL,MOTRIN) 400 MG tablet Take 1 tablet (400 mg total) by mouth every 6 (six) hours as needed. 12/18/16  Yes  [name], MD  lansoprazole (PREVACID) 30 MG capsule Take 1 capsule by mouth daily. 08/21/16  Yes [provider]  loratadine (CLARITIN) 10 MG tablet Take 10 mg by mouth daily as needed for allergies.   Yes [provider]  triamcinolone ointment (KENALOG) 0.5 % Apply 1 application topically 2 (two) times daily. Patient taking differently: Apply 1 application topically 2 (two) times daily as needed.  04/14/16  Yes Nafziger, [name], NP  vitamin C (ASCORBIC ACID) 500 MG tablet Take 500 mg by mouth daily.   Yes [provider]  lansoprazole (PREVACID) 30 MG capsule Take 1 capsule (30 mg total) by mouth daily. 03/20/17   [name], MD    Allergies as of 03/20/2017 - Review Complete 03/20/2017  Allergen Reaction Noted  . Peanut-containing drug products Hives 12/31/2009  . Penicillins Hives 03/11/2008    Family History  Problem Relation Age of Onset  . Cancer Mother        lung  . Thyroid disease Mother   . Diabetes Father   . GI Bleed Father   . Aneurysm Father   . AAA (abdominal aortic aneurysm) Father   . Colon cancer Neg Hx   . Colon polyps Neg Hx   . Rectal cancer Neg Hx   . Stomach cancer Neg Hx     Social History   Social History  . Marital status: Married    Spouse name: N/A  . Number of children: N/A  . Years of education: N/A   Occupational History  . Not on file.   Social History Main Topics  . Smoking status: Former Smoker    Quit date: 07/24/1964  . Smokeless tobacco: Never Used     Comment: Quit 20-30 years ago  . Alcohol use Yes     Comment: occasional; couple beers/day  . Drug use: No  . Sexual activity: Not on file   Other Topics Concern  . Not on file   Social History Narrative   Daily Caffeine use: 5-6 drinks daily   Exercising 2 times per week   No Drug use             Review of Systems: See HPI, otherwise negative ROS  Physical Exam: BP (!) 153/92   Pulse 64   Temp (!) 97.5 F (36.4 C) (Oral)   Ht 5\' 10"   (1.778 m)   Wt 230 lb (104.3 kg)   BMI 33.00 kg/m  General:   Alert,  Well-developed, well-nourished, pleasant and cooperative in NAD Neck:  Supple; no masses or thyromegaly. No significant cervical adenopathy. Lungs:  Clear throughout to auscultation.   No wheezes, crackles, or rhonchi. No acute distress. Heart:  Regular rate and rhythm; no murmurs, clicks, rubs,  or gallops. Abdomen: Non-distended, normal bowel sounds.  Soft and nontender without appreciable mass or hepatosplenomegaly.  Pulses:  Normal  pulses noted. Extremities:  Without clubbing or edema.  Impression:  Pleasant 71 year old gentleman with a Schatzki's ring long-standing GERD. Recent food impaction. He's done well after undergoing Maloney dilation February of this year. I discussed the association between rings and reflux. He really needs to take acid suppression every day to help prevent recurrence of the brain. He asked about his next colonoscopy. I did retrieve the records. Dr. Henrene Pastor performed a colonoscopy on 05/19/2016. He removed 6 polyps. Path revealed simple adenomas and a serrated polyp without dysplasia. Patient tells me he would just like to have his next colonoscopy done up here to avoid driving all the way to [facility].   Recommendations:  GERD information provided  Continue lanzaprazole 30 mg daily  Office visit in 1 year  If he decides to have his next colonoscopy up here, would plan for the fall of 2019.           Notice: This dictation was prepared with Dragon dictation along with smaller phrase technology. Any transcriptional errors that result from this process are unintentional and may not be corrected upon review.

## 2017-04-19 ENCOUNTER — Other Ambulatory Visit: Payer: Self-pay | Admitting: Family Medicine

## 2017-04-19 DIAGNOSIS — I10 Essential (primary) hypertension: Secondary | ICD-10-CM

## 2017-04-19 NOTE — Telephone Encounter (Signed)
Pt now due for yearly

## 2017-04-20 MED ORDER — AMLODIPINE BESYLATE 5 MG PO TABS: ORAL_TABLET | ORAL | 0 refills | Status: DC

## 2017-04-20 NOTE — Telephone Encounter (Signed)
Sent to the pharmacy by e-scribe. 

## 2017-05-17 ENCOUNTER — Other Ambulatory Visit: Payer: Self-pay | Admitting: *Deleted

## 2017-05-17 DIAGNOSIS — I714 Abdominal aortic aneurysm, without rupture, unspecified: Secondary | ICD-10-CM

## 2017-05-22 DIAGNOSIS — Z23 Encounter for immunization: Secondary | ICD-10-CM | POA: Diagnosis not present

## 2017-06-22 ENCOUNTER — Encounter: Payer: Self-pay | Admitting: Adult Health

## 2017-06-22 ENCOUNTER — Telehealth: Payer: Self-pay | Admitting: Adult Health

## 2017-06-22 ENCOUNTER — Ambulatory Visit (INDEPENDENT_AMBULATORY_CARE_PROVIDER_SITE_OTHER): Payer: Medicare Other | Admitting: Adult Health

## 2017-06-22 VITALS — BP 158/80 | Temp 97.9°F | Wt 229.0 lb

## 2017-06-22 DIAGNOSIS — N401 Enlarged prostate with lower urinary tract symptoms: Secondary | ICD-10-CM

## 2017-06-22 DIAGNOSIS — I10 Essential (primary) hypertension: Secondary | ICD-10-CM

## 2017-06-22 DIAGNOSIS — R3912 Poor urinary stream: Secondary | ICD-10-CM | POA: Diagnosis not present

## 2017-06-22 LAB — POCT URINALYSIS DIPSTICK
Bilirubin, UA: NEGATIVE
Blood, UA: NEGATIVE
Glucose, UA: NEGATIVE
Ketones, UA: NEGATIVE
Leukocytes, UA: NEGATIVE
Nitrite, UA: NEGATIVE
Spec Grav, UA: >=1.03 — AB
Urobilinogen, UA: 0.2 U/dL
pH, UA: 5.5

## 2017-06-22 LAB — PSA: PSA: 3.14 ng/mL (ref 0.10–4.00)

## 2017-06-22 MED ORDER — AMLODIPINE BESYLATE 5 MG PO TABS: ORAL_TABLET | ORAL | 1 refills | Status: DC

## 2017-06-22 MED ORDER — TAMSULOSIN HCL 0.4 MG PO CAPS: 0.4000 mg | ORAL_CAPSULE | Freq: Every day | ORAL | 3 refills | Status: DC

## 2017-06-22 NOTE — Telephone Encounter (Signed)
Spoke to [place] and informed of his labs. Flomax will be sent into pharmacy

## 2017-06-22 NOTE — Progress Notes (Signed)
Subjective:    Patient ID: Daniel Guerrero, male    DOB: 06/27/1946, 71 y.o.   MRN: [id number]  HPI  71 year old male who  has a past medical history of Allergy, Cataract of both eyes, Eczema, Enlarged aorta ([facility]), Erectile dysfunction, Hearing loss, History of inguinal hernia repair, bilateral, Hypertension, Kidney stones, Loss of hearing, Meniere disease, Obesity, Paroxysmal SVT (supraventricular tachycardia) ([place]), Psoriasis, S/P appendectomy, S/P tonsillectomy, Seasonal allergies, and Type II or unspecified type diabetes mellitus without mention of complication, uncontrolled.  He presents to the office today worsening issues of urinary incontinence and other BPH issues. He feels as though his incontinence is more a " urge".  He also has a " very weak stream", and nocturia where he is waking up 2-3 times per night to urinate. In addition to this he feels as though he is not emptying his bladder completely.   He denies any dysuria, hematuria,low back pain, or abdominal pain   He needs a refill of Norvasc as he has been out   Review of Systems See HPI   Past Medical History:  Diagnosis Date  . Allergy   . Cataract of both eyes    implants both eyes   . Eczema   . Enlarged aorta ([place])   . Erectile dysfunction   . Hearing loss    high frequency  . History of inguinal hernia repair, bilateral   . Hypertension   . Kidney stones   . Loss of hearing    high frequency  . Meniere disease   . Obesity   . Paroxysmal SVT (supraventricular tachycardia) ([place])   . Psoriasis   . S/P appendectomy   . S/P tonsillectomy   . Seasonal allergies   . Type II or unspecified type diabetes mellitus without mention of complication, uncontrolled    12-29-14 Told he was prediabetic once    Social History   Socioeconomic History  . Marital status: Married    Spouse name: Not on file  . Number of children: Not on file  . Years of education: Not on file  . Highest education level: Not on file    Social Needs  . Financial resource strain: Not on file  . Food insecurity - worry: Not on file  . Food insecurity - inability: Not on file  . Transportation needs - medical: Not on file  . Transportation needs - non-medical: Not on file  Occupational History  . Not on file  Tobacco Use  . Smoking status: Former Smoker    Last attempt to quit: 07/24/1964    Years since quitting: 52.9  . Smokeless tobacco: Never Used  . Tobacco comment: Quit 20-30 years ago  Substance and Sexual Activity  . Alcohol use: Yes    Comment: occasional; couple beers/day  . Drug use: No  . Sexual activity: Not on file  Other Topics Concern  . Not on file  Social History Narrative   Daily Caffeine use: 5-6 drinks daily   Exercising 2 times per week   No Drug use          Past Surgical History:  Procedure Laterality Date  . Ablasion    . APPENDECTOMY    . COLONOSCOPY     unsure where or when- states greater than 10 yrs ago per pt- was normal per pt and his wife   . CYSTOSCOPY WITH RETROGRADE PYELOGRAM, URETEROSCOPY AND STENT PLACEMENT Left 12/30/2014   Procedure: CYSTOSCOPY WITH LEFT URETEROSCOPY STONE EXTRACTION WITH  STENT;  Surgeon: Franchot Gallo, MD;  [facility];  Service: Urology;  Laterality: Left;  . ELECTROPHYSIOLOGIC STUDY N/A 08/24/2015   Procedure: SVT Ablation;  Surgeon: Thompson Grayer, MD;  Location: [facility];  Service: Cardiovascular;  Laterality: N/A;  . ENDOVENOUS ABLATION SAPHENOUS VEIN W/ LASER Right 12/04/2016   endovenous laser ablation right greater saphenous vein by Tinnie Gens MD   . ESOPHAGOGASTRODUODENOSCOPY N/A 08/18/2016   Procedure: ESOPHAGOGASTRODUODENOSCOPY (EGD);  Surgeon: [name], MD;  [facility];  Service: Endoscopy;  Laterality: N/A;  . ESOPHAGOGASTRODUODENOSCOPY N/A 09/06/2016   Procedure: ESOPHAGOGASTRODUODENOSCOPY (EGD);  Surgeon: [name], MD;  [facility];  Service: Endoscopy;  Laterality: N/A;  100 - moved to  2/14 @ 2:15 per Tretha Sciara  . EYE SURGERY     Catracts removed both eye 30 yrs ago  . HERNIA REPAIR     right and left  . HOLMIUM LASER APPLICATION Left 08/27/5571   Procedure: HOLMIUM LASER APPLICATION;  Surgeon: Franchot Gallo, MD;  [facility];  Service: Urology;  Laterality: Left;  Marland Kitchen MALONEY DILATION N/A 09/06/2016   Procedure: Venia Minks DILATION;  Surgeon: [name], MD;  [facility];  Service: Endoscopy;  Laterality: N/A;  . TONSILLECTOMY      Family History  Problem Relation Age of Onset  . Cancer Mother        lung  . Thyroid disease Mother   . Diabetes Father   . GI Bleed Father   . Aneurysm Father   . AAA (abdominal aortic aneurysm) Father   . Colon cancer Neg Hx   . Colon polyps Neg Hx   . Rectal cancer Neg Hx   . Stomach cancer Neg Hx     Allergies  Allergen Reactions  . Peanut-Containing Drug Products Hives  . Penicillins Hives    Has patient had a PCN reaction causing immediate rash, facial/tongue/throat swelling, SOB or lightheadedness with hypotension: No Has patient had a PCN reaction causing severe rash involving mucus membranes or skin necrosis: No Has patient had a PCN reaction that required hospitalization No Has patient had a PCN reaction occurring within the last 10 years: No If all of the above answers are "NO", then may proceed with Cephalosporin use.    Current Outpatient Medications on File Prior to Visit  Medication Sig Dispense Refill  . aspirin 81 MG chewable tablet Chew 81 mg by mouth daily.    Marland Kitchen b complex vitamins tablet Take 1 tablet by mouth daily.    . Cyanocobalamin (VITAMIN B-12 PO) Take 1 tablet by mouth daily.    . fluticasone (FLONASE) 50 MCG/ACT nasal spray Place 2 sprays into both nostrils daily as needed for allergies or rhinitis.    Marland Kitchen ibuprofen (ADVIL,MOTRIN) 400 MG tablet Take 1 tablet (400 mg total) by mouth every 6 (six) hours as needed. 30 tablet 0  . lansoprazole (PREVACID) 30 MG capsule Take 1 capsule (30  mg total) by mouth daily. 90 capsule 3  . loratadine (CLARITIN) 10 MG tablet Take 10 mg by mouth daily as needed for allergies.    Marland Kitchen triamcinolone ointment (KENALOG) 0.5 % Apply 1 application topically 2 (two) times daily. (Patient taking differently: Apply 1 application topically 2 (two) times daily as needed. ) 30 g 0  . vitamin C (ASCORBIC ACID) 500 MG tablet Take 500 mg by mouth daily.     No current facility-administered medications on file prior to visit.  BP (!) 158/80 (BP Location: Left Arm)   Temp 97.9 F (36.6 C) (Oral)   Wt 229 lb (103.9 kg)   BMI 32.86 kg/m       Objective:   Physical Exam  Constitutional: He is oriented to person, place, and time. He appears well-developed and well-nourished.  Cardiovascular: Normal rate, regular rhythm, normal heart sounds and intact distal pulses. Exam reveals no gallop and no friction rub.  No murmur heard. Pulmonary/Chest: Effort normal and breath sounds normal. No respiratory distress. He has no wheezes. He has no rales. He exhibits no tenderness.  Neurological: He is alert and oriented to person, place, and time.  Skin: Skin is warm and dry. No rash noted. He is not diaphoretic. No erythema. No pallor.  Psychiatric: He has a normal mood and affect. His behavior is normal. Judgment and thought content normal.  Nursing note and vitals reviewed.     Assessment & Plan:  1. Benign prostatic hyperplasia with weak urinary stream - PSA - POC Urinalysis Dipstick - Will check lab work. Prescribe flomax for BPH with urinary symptoms 2. Essential hypertension  - amLODipine (NORVASC) 5 MG tablet; Take 1 tablet by mouth daily.  Due for physical with [name]  Dispense: 90 tablet; Refill: 1   Dorothyann Peng, NP

## 2017-07-04 ENCOUNTER — Encounter: Payer: Medicare Other | Admitting: Adult Health

## 2017-07-27 ENCOUNTER — Ambulatory Visit (INDEPENDENT_AMBULATORY_CARE_PROVIDER_SITE_OTHER): Payer: Medicare Other | Admitting: Adult Health

## 2017-07-27 ENCOUNTER — Encounter: Payer: Self-pay | Admitting: Adult Health

## 2017-07-27 ENCOUNTER — Ambulatory Visit (INDEPENDENT_AMBULATORY_CARE_PROVIDER_SITE_OTHER): Payer: Medicare Other

## 2017-07-27 VITALS — HR 62 | Ht 70.0 in | Wt 230.0 lb

## 2017-07-27 VITALS — BP 120/80 | Ht 70.0 in

## 2017-07-27 DIAGNOSIS — N401 Enlarged prostate with lower urinary tract symptoms: Secondary | ICD-10-CM

## 2017-07-27 DIAGNOSIS — I10 Essential (primary) hypertension: Secondary | ICD-10-CM | POA: Diagnosis not present

## 2017-07-27 DIAGNOSIS — Z1159 Encounter for screening for other viral diseases: Secondary | ICD-10-CM

## 2017-07-27 DIAGNOSIS — E782 Mixed hyperlipidemia: Secondary | ICD-10-CM

## 2017-07-27 DIAGNOSIS — N400 Enlarged prostate without lower urinary tract symptoms: Secondary | ICD-10-CM | POA: Insufficient documentation

## 2017-07-27 DIAGNOSIS — R3911 Hesitancy of micturition: Secondary | ICD-10-CM | POA: Diagnosis not present

## 2017-07-27 LAB — BASIC METABOLIC PANEL
BUN: 21 mg/dL (ref 6–23)
CO2: 30 mEq/L (ref 19–32)
Calcium: 9.3 mg/dL (ref 8.4–10.5)
Chloride: 104 mEq/L (ref 96–112)
Creatinine, Ser: 0.97 mg/dL (ref 0.40–1.50)
GFR: 80.95 mL/min (ref >60.00)
Glucose, Bld: 107 mg/dL — ABNORMAL HIGH (ref 70–99)
POTASSIUM: 4.1 meq/L (ref 3.5–5.1)
SODIUM: 141 meq/L (ref 135–145)

## 2017-07-27 LAB — PSA: PSA: 3.59 ng/mL (ref 0.10–4.00)

## 2017-07-27 LAB — CBC WITH DIFFERENTIAL/PLATELET
Basophils Absolute: 0 10*3/uL (ref 0.0–0.1)
Basophils Relative: 0.5 % (ref 0.0–3.0)
EOS PCT: 3.5 % (ref 0.0–5.0)
Eosinophils Absolute: 0.2 10*3/uL (ref 0.0–0.7)
HCT: 45 % (ref 39.0–52.0)
HEMOGLOBIN: 15.4 g/dL (ref 13.0–17.0)
Lymphocytes Relative: 24.8 % (ref 12.0–46.0)
Lymphs Abs: 1.5 10*3/uL (ref 0.7–4.0)
MCHC: 34.3 g/dL (ref 30.0–36.0)
MCV: 94.9 fl (ref 78.0–100.0)
MONO ABS: 0.5 10*3/uL (ref 0.1–1.0)
MONOS PCT: 8.8 % (ref 3.0–12.0)
Neutro Abs: 3.7 10*3/uL (ref 1.4–7.7)
Neutrophils Relative %: 62.4 % (ref 43.0–77.0)
Platelets: 213 10*3/uL (ref 150.0–400.0)
RBC: 4.74 Mil/uL (ref 4.22–5.81)
RDW: 12.6 % (ref 11.5–15.5)
WBC: 6 10*3/uL (ref 4.0–10.5)

## 2017-07-27 LAB — HEPATIC FUNCTION PANEL
ALT: 25 U/L (ref 0–53)
AST: 54 U/L — AB (ref 0–37)
Albumin: 4.5 g/dL (ref 3.5–5.2)
Alkaline Phosphatase: 89 U/L (ref 39–117)
BILIRUBIN DIRECT: 0.2 mg/dL (ref 0.0–0.3)
BILIRUBIN TOTAL: 1.2 mg/dL (ref 0.2–1.2)
TOTAL PROTEIN: 6.7 g/dL (ref 6.0–8.3)

## 2017-07-27 LAB — LIPID PANEL
CHOL/HDL RATIO: 5
CHOLESTEROL: 189 mg/dL (ref 0–200)
HDL: 39.6 mg/dL (ref >39.00)
LDL CALC: 119 mg/dL — AB (ref 0–99)
NonHDL: 149.67
Triglycerides: 151 mg/dL — ABNORMAL HIGH (ref 0.0–149.0)
VLDL: 30.2 mg/dL (ref 0.0–40.0)

## 2017-07-27 LAB — TSH: TSH: 1.63 u[IU]/mL (ref 0.35–4.50)

## 2017-07-27 NOTE — Progress Notes (Signed)
Subjective:    Patient ID: Daniel Guerrero, male    DOB: [birth year], 72 y.o.   MRN: [id number]  HPI Patient presents for yearly preventative medicine examination. He is a pleasant 72 year old male who  has a past medical history of Allergy, Cataract of both eyes, Eczema, Enlarged aorta (HCC), Erectile dysfunction, Hearing loss, History of inguinal hernia repair, bilateral, Hypertension, Kidney stones, Loss of hearing, Meniere disease, Obesity, Paroxysmal SVT (supraventricular tachycardia) ([place]), Psoriasis, S/P appendectomy, S/P tonsillectomy, Seasonal allergies, and Type II or unspecified type diabetes mellitus without mention of complication, uncontrolled.  He takes Norvasc 5 mg for hypertension  He takes Flomax for BPH with urinary symptoms. He started Flomax about a month ago and has noticed "significant" improvement  All immunizations and health maintenance protocols were reviewed with the patient and needed orders were placed.  Appropriate screening laboratory values were ordered for the patient including screening of hyperlipidemia, renal function and hepatic function. If indicated by BPH, a PSA was ordered.  Medication reconciliation,  past medical history, social history, problem list and allergies were reviewed in detail with the patient  Goals were established with regard to weight loss, exercise, and  diet in compliance with medications. He reports that he has started walking more to help him lose weight.   End of life planning was discussed.  He is up to date on his colonoscopy, dental, and vision exams.    Review of Systems  Constitutional: Negative.   HENT: Positive for trouble swallowing (chronic ).   Eyes: Negative.   Respiratory: Negative.   Cardiovascular: Negative.   Gastrointestinal: Negative.   Endocrine: Negative.   Genitourinary: Negative.   Musculoskeletal: Negative.   Skin: Negative.   Neurological: Negative.   Hematological: Negative.     Psychiatric/Behavioral: Negative.    Past Medical History:  Diagnosis Date  . Allergy   . Cataract of both eyes    implants both eyes   . Eczema   . Enlarged aorta ([place])   . Erectile dysfunction   . Hearing loss    high frequency  . History of inguinal hernia repair, bilateral   . Hypertension   . Kidney stones   . Loss of hearing    high frequency  . Meniere disease   . Obesity   . Paroxysmal SVT (supraventricular tachycardia) ([place])   . Psoriasis   . S/P appendectomy   . S/P tonsillectomy   . Seasonal allergies   . Type II or unspecified type diabetes mellitus without mention of complication, uncontrolled    12-29-14 Told he was prediabetic once    Social History   Socioeconomic History  . Marital status: Married    Spouse name: Not on file  . Number of children: Not on file  . Years of education: Not on file  . Highest education level: Not on file  Social Needs  . Financial resource strain: Not on file  . Food insecurity - worry: Not on file  . Food insecurity - inability: Not on file  . Transportation needs - medical: Not on file  . Transportation needs - non-medical: Not on file  Occupational History  . Not on file  Tobacco Use  . Smoking status: Former Smoker    Last attempt to quit: 07/24/1964    Years since quitting: 53.0  . Smokeless tobacco: Never Used  . Tobacco comment: Quit 20-30 years ago; smoked pipe and cigars   Substance and Sexual Activity  . Alcohol use: Yes  Comment: occasional; couple beers/day  . Drug use: No  . Sexual activity: Not on file  Other Topics Concern  . Not on file  Social History Narrative   Daily Caffeine use: 5-6 drinks daily   Exercising 2 times per week   No Drug use          Past Surgical History:  Procedure Laterality Date  . Ablasion    . APPENDECTOMY    . COLONOSCOPY     unsure where or when- states greater than 10 yrs ago per pt- was normal per pt and his wife   . CYSTOSCOPY WITH RETROGRADE PYELOGRAM,  URETEROSCOPY AND STENT PLACEMENT Left 12/30/2014   Procedure: CYSTOSCOPY WITH LEFT URETEROSCOPY STONE EXTRACTION WITH STENT;  Surgeon: Franchot Gallo, MD;  [facility]: [facility];  Service: Urology;  Laterality: Left;  . ELECTROPHYSIOLOGIC STUDY N/A 08/24/2015   Procedure: SVT Ablation;  Surgeon: Thompson Grayer, MD;  Location: [place] [facility];  Service: Cardiovascular;  Laterality: N/A;  . ENDOVENOUS ABLATION SAPHENOUS VEIN W/ LASER Right 12/04/2016   endovenous laser ablation right greater saphenous vein by Tinnie Gens MD   . ESOPHAGOGASTRODUODENOSCOPY N/A 08/18/2016   Procedure: ESOPHAGOGASTRODUODENOSCOPY (EGD);  Surgeon: [name], MD;  [facility];  Service: Endoscopy;  Laterality: N/A;  . ESOPHAGOGASTRODUODENOSCOPY N/A 09/06/2016   Procedure: ESOPHAGOGASTRODUODENOSCOPY (EGD);  Surgeon: [name], MD;  [facility];  Service: Endoscopy;  Laterality: N/A;  100 - moved to 2/14 @ 2:15 per Tretha Sciara  . EYE SURGERY     Catracts removed both eye 30 yrs ago  . HERNIA REPAIR     right and left  . HOLMIUM LASER APPLICATION Left 4/0/9811   Procedure: HOLMIUM LASER APPLICATION;  Surgeon: Franchot Gallo, MD;  [facility]: [facility];  Service: Urology;  Laterality: Left;  Marland Kitchen MALONEY DILATION N/A 09/06/2016   Procedure: Venia Minks DILATION;  Surgeon: [name], MD;  [facility];  Service: Endoscopy;  Laterality: N/A;  . TONSILLECTOMY      Family History  Problem Relation Age of Onset  . Cancer Mother        lung  . Thyroid disease Mother   . Diabetes Father   . GI Bleed Father   . Aneurysm Father   . AAA (abdominal aortic aneurysm) Father   . Colon cancer Neg Hx   . Colon polyps Neg Hx   . Rectal cancer Neg Hx   . Stomach cancer Neg Hx     Allergies  Allergen Reactions  . Peanut-Containing Drug Products Hives  . Penicillins Hives    Has patient had a PCN reaction causing immediate rash, facial/tongue/throat swelling, SOB or lightheadedness with  hypotension: No Has patient had a PCN reaction causing severe rash involving mucus membranes or skin necrosis: No Has patient had a PCN reaction that required hospitalization No Has patient had a PCN reaction occurring within the last 10 years: No If all of the above answers are "NO", then may proceed with Cephalosporin use.    Current Outpatient Medications on File Prior to Visit  Medication Sig Dispense Refill  . amLODipine (NORVASC) 5 MG tablet Take 1 tablet by mouth daily.  Due for physical with Leeana Creer 90 tablet 1  . aspirin 81 MG chewable tablet Chew 81 mg by mouth daily.    Marland Kitchen b complex vitamins tablet Take 1 tablet by mouth daily.    . Cyanocobalamin (VITAMIN B-12 PO) Take 1 tablet by mouth daily.    . fluticasone (  FLONASE) 50 MCG/ACT nasal spray Place 2 sprays into both nostrils daily as needed for allergies or rhinitis.    Marland Kitchen ibuprofen (ADVIL,MOTRIN) 400 MG tablet Take 1 tablet (400 mg total) by mouth every 6 (six) hours as needed. 30 tablet 0  . lansoprazole (PREVACID) 30 MG capsule Take 1 capsule (30 mg total) by mouth daily. 90 capsule 3  . loratadine (CLARITIN) 10 MG tablet Take 10 mg by mouth daily as needed for allergies.    . tamsulosin (FLOMAX) 0.4 MG CAPS capsule Take 1 capsule (0.4 mg total) by mouth daily. 30 capsule 3  . triamcinolone ointment (KENALOG) 0.5 % Apply 1 application topically 2 (two) times daily. (Patient taking differently: Apply 1 application topically 2 (two) times daily as needed. ) 30 g 0  . vitamin C (ASCORBIC ACID) 500 MG tablet Take 500 mg by mouth daily.     No current facility-administered medications on file prior to visit.     BP 120/80 (BP Location: Left Arm, Patient Position: Sitting, Cuff Size: Large)   Ht 5\' 10"  (1.778 m)   SpO2 98%   BMI 33.00 kg/m       Objective:   Physical Exam  Constitutional: He is oriented to person, place, and time. He appears well-developed and well-nourished. No distress.  Obese    HENT:  Head:  Normocephalic and atraumatic.  Right Ear: External ear normal.  Left Ear: External ear normal.  Nose: Nose normal.  Mouth/Throat: Oropharynx is clear and moist. No oropharyngeal exudate.  Eyes: Conjunctivae and EOM are normal. Pupils are equal, round, and reactive to light. Right eye exhibits no discharge. Left eye exhibits no discharge. No scleral icterus.  Neck: Normal range of motion. Neck supple. No JVD present. No tracheal deviation present. No thyromegaly present.  Cardiovascular: Normal rate, regular rhythm, normal heart sounds and intact distal pulses. Exam reveals no gallop and no friction rub.  No murmur heard. Pulmonary/Chest: Effort normal and breath sounds normal. No stridor. No respiratory distress. He has no wheezes. He has no rales. He exhibits no tenderness.  Abdominal: Soft. Bowel sounds are normal. He exhibits no distension and no mass. There is no tenderness. There is no rebound and no guarding.  Genitourinary: Rectal exam shows external hemorrhoid. Prostate is enlarged. Prostate is not tender.  Musculoskeletal: Normal range of motion. He exhibits no edema, tenderness or deformity.  Lymphadenopathy:    He has no cervical adenopathy.  Neurological: He is alert and oriented to person, place, and time. He has normal reflexes. He displays normal reflexes. No cranial nerve deficit. He exhibits normal muscle tone. Coordination normal.  Skin: Skin is warm and dry. No rash noted. He is not diaphoretic. No erythema. No pallor.  Psychiatric: He has a normal mood and affect. His behavior is normal. Judgment and thought content normal.  Nursing note and vitals reviewed.     Assessment & Plan:  1. Essential hypertension - well controlled.  - Encouraged to lose weight through diet and exercise  - Basic metabolic panel - CBC with Differential/Platelet - Hepatic function panel - Lipid panel - TSH - PSA  2. Mixed hyperlipidemia - Consider adding statin  - Basic metabolic panel -  CBC with Differential/Platelet - Hepatic function panel - Lipid panel - TSH - PSA  3. Benign prostatic hyperplasia with urinary hesitancy - Continue to use flomax - Basic metabolic panel - CBC with Differential/Platelet - Hepatic function panel - Lipid panel - TSH - PSA   Dorothyann Peng, NP

## 2017-07-27 NOTE — Progress Notes (Signed)
Subjective:   Daniel Guerrero is a 72 y.o. male who presents for Medicare Annual (Subsequent) preventive examination.  Still working  [place] Has 3 children; in the [place] in the [place] - no VA benefits  Does VA benefit but not medical   Last OV 06/22/2017 and will see Daniel Guerrero today   Diet Eats 3 meals Breakfast; light breakfast with cereal and smoothie Lunch; depends on where he is;  Supper eats good meals at home. Wife is a great cook    BMI 33   Exercise HDL 34 Does not get as much as he should Walks around in the class room 1 to 2 days a week   Tobacco; US aorta-AAA 06/2016 to fup in one year 06/2017 The patient is scheduled in 2 weeks     Health Maintenance Due  Topic Date Due  . Hepatitis C Screening  24-Oct-1945   Educated regarding the shingrix   Cardiac Risk Factors include: advanced age (>18men, >24 women);family history of premature cardiovascular disease;hypertension;male gender;obesity (BMI >30kg/m2)  NO dx of diabetes; just hyperglycemia  Will postpone foot exam  Ua Micro-albumin requires medicare dx of DM I or II and labs normal at present  A1c will be deferred to Daniel Guerrero- NP today Last BS 101 and A1c in Sept 2017 was normal   PSA 3.14  Did have eye exam 07/2016  Colonoscopy 04/2016        Objective:     Vitals: Pulse 62   Ht 5\' 10"  (1.778 m)   Wt 230 lb (104.3 kg)   SpO2 98%   BMI 33.00 kg/m   Body mass index is 33 kg/m.  Advanced Directives 07/27/2017 12/04/2016 09/06/2016 08/18/2016 07/18/2016 05/18/2016 04/27/2016  Does Patient Have a Medical Advance Directive? Yes No Yes No Yes Yes Yes  Type of Advance Directive - - [place];Living will - [place];Living will [place];Living will [place];Living will  Copy of [place] in Chart? - - No - copy requested - No - copy requested - -  Would patient like information on  creating a medical advance directive? - - No - Patient declined No - Patient declined - - -    Tobacco Social History   Tobacco Use  Smoking Status Former Smoker  . Last attempt to quit: 07/24/1964  . Years since quitting: 53.0  Smokeless Tobacco Never Used  Tobacco Comment   Quit 20-30 years ago; smoked pipe and cigars      Counseling given: Yes Comment: Quit 20-30 years ago; smoked pipe and cigars    Clinical Intake:   Past Medical History:  Diagnosis Date  . Allergy   . Cataract of both eyes    implants both eyes   . Eczema   . Enlarged aorta ([place])   . Erectile dysfunction   . Hearing loss    high frequency  . History of inguinal hernia repair, bilateral   . Hypertension   . Kidney stones   . Loss of hearing    high frequency  . Meniere disease   . Obesity   . Paroxysmal SVT (supraventricular tachycardia) ([place])   . Psoriasis   . S/P appendectomy   . S/P tonsillectomy   . Seasonal allergies   . Type II or unspecified type diabetes mellitus without mention of complication, uncontrolled    12-29-14 Told he was prediabetic once   Past Surgical History:  Procedure  Laterality Date  . Ablasion    . APPENDECTOMY    . COLONOSCOPY     unsure where or when- states greater than 10 yrs ago per pt- was normal per pt and his wife   . CYSTOSCOPY WITH RETROGRADE PYELOGRAM, URETEROSCOPY AND STENT PLACEMENT Left 12/30/2014   Procedure: CYSTOSCOPY WITH LEFT URETEROSCOPY STONE EXTRACTION WITH STENT;  Surgeon: Franchot Gallo, MD;  [facility];  Service: Urology;  Laterality: Left;  . ELECTROPHYSIOLOGIC STUDY N/A 08/24/2015   Procedure: SVT Ablation;  Surgeon: Thompson Grayer, MD;  Location: [place] [facility];  Service: Cardiovascular;  Laterality: N/A;  . ENDOVENOUS ABLATION SAPHENOUS VEIN W/ LASER Right 12/04/2016   endovenous laser ablation right greater saphenous vein by Tinnie Gens MD   . ESOPHAGOGASTRODUODENOSCOPY N/A 08/18/2016   Procedure: ESOPHAGOGASTRODUODENOSCOPY  (EGD);  Surgeon: [name], MD;  [facility];  Service: Endoscopy;  Laterality: N/A;  . ESOPHAGOGASTRODUODENOSCOPY N/A 09/06/2016   Procedure: ESOPHAGOGASTRODUODENOSCOPY (EGD);  Surgeon: [name], MD;  [facility];  Service: Endoscopy;  Laterality: N/A;  100 - moved to 2/14 @ 2:15 per Tretha Sciara  . EYE SURGERY     Catracts removed both eye 30 yrs ago  . HERNIA REPAIR     right and left  . HOLMIUM LASER APPLICATION Left 02/21/8298   Procedure: HOLMIUM LASER APPLICATION;  Surgeon: Franchot Gallo, MD;  [facility];  Service: Urology;  Laterality: Left;  Marland Kitchen MALONEY DILATION N/A 09/06/2016   Procedure: Venia Minks DILATION;  Surgeon: [name], MD;  [facility];  Service: Endoscopy;  Laterality: N/A;  . TONSILLECTOMY     Family History  Problem Relation Age of Onset  . Cancer Mother        lung  . Thyroid disease Mother   . Diabetes Father   . GI Bleed Father   . Aneurysm Father   . AAA (abdominal aortic aneurysm) Father   . Colon cancer Neg Hx   . Colon polyps Neg Hx   . Rectal cancer Neg Hx   . Stomach cancer Neg Hx    Social History   Socioeconomic History  . Marital status: Married    Spouse name: Not on file  . Number of children: Not on file  . Years of education: Not on file  . Highest education level: Not on file  Social Needs  . Financial resource strain: Not on file  . Food insecurity - worry: Not on file  . Food insecurity - inability: Not on file  . Transportation needs - medical: Not on file  . Transportation needs - non-medical: Not on file  Occupational History  . Not on file  Tobacco Use  . Smoking status: Former Smoker    Last attempt to quit: 07/24/1964    Years since quitting: 53.0  . Smokeless tobacco: Never Used  . Tobacco comment: Quit 20-30 years ago; smoked pipe and cigars   Substance and Sexual Activity  . Alcohol use: Yes    Comment: occasional; couple beers/day  . Drug use: No  . Sexual activity:  Not on file  Other Topics Concern  . Not on file  Social History Narrative   Daily Caffeine use: 5-6 drinks daily   Exercising 2 times per week   No Drug use          Outpatient Encounter Medications as of 07/27/2017  Medication Sig  . amLODipine (NORVASC) 5 MG tablet Take 1 tablet by mouth daily.  Due  for physical with Daniel Guerrero  . aspirin 81 MG chewable tablet Chew 81 mg by mouth daily.  Marland Kitchen b complex vitamins tablet Take 1 tablet by mouth daily.  . Cyanocobalamin (VITAMIN B-12 PO) Take 1 tablet by mouth daily.  . fluticasone (FLONASE) 50 MCG/ACT nasal spray Place 2 sprays into both nostrils daily as needed for allergies or rhinitis.  Marland Kitchen ibuprofen (ADVIL,MOTRIN) 400 MG tablet Take 1 tablet (400 mg total) by mouth every 6 (six) hours as needed.  . lansoprazole (PREVACID) 30 MG capsule Take 1 capsule (30 mg total) by mouth daily.  Marland Kitchen loratadine (CLARITIN) 10 MG tablet Take 10 mg by mouth daily as needed for allergies.  . tamsulosin (FLOMAX) 0.4 MG CAPS capsule Take 1 capsule (0.4 mg total) by mouth daily.  Marland Kitchen triamcinolone ointment (KENALOG) 0.5 % Apply 1 application topically 2 (two) times daily. (Patient taking differently: Apply 1 application topically 2 (two) times daily as needed. )  . vitamin C (ASCORBIC ACID) 500 MG tablet Take 500 mg by mouth daily.   No facility-administered encounter medications on file as of 07/27/2017.     Activities of Daily Living In your present state of health, do you have any difficulty performing the following activities: 07/27/2017  Hearing? Y  Vision? N  Difficulty concentrating or making decisions? N  Walking or climbing stairs? N  Dressing or bathing? N  Doing errands, shopping? N  Preparing Food and eating ? N  Using the Toilet? N  In the past six months, have you accidently leaked urine? Y  Do you have problems with loss of bowel control? N  Managing your Medications? N  Managing your Finances? N  Housekeeping or managing your Housekeeping? N  Some  recent data might be hidden    Patient Care Team: Dorothyann Peng, NP as PCP - General (Family Medicine) Grace Isaac, MD as Consulting Physician (Cardiothoracic Surgery) Thompson Grayer, MD as Consulting Physician (Cardiology) Herminio Commons, MD as Attending Physician (Cardiology)    Assessment:   This is a routine wellness examination for Jarmon.  Exercise Activities and Dietary recommendations Current Exercise Habits: Home exercise routine, Type of exercise: walking, Time (Minutes): 30, Frequency (Times/Week): 5, Weekly Exercise (Minutes/Week): 150, Intensity: Mild  Goals    . Patient Stated     To start walking this year Walks outdoors, [facility]; try to increase up to 30 minutes 5 days a week  Get a fit bit        Fall Risk Fall Risk  07/27/2017 10/19/2015 12/18/2013  Falls in the past year? Yes No No  Number falls in past yr: 1 - -  Injury with Fall? No - -  Comment had meinere's  - -  Follow up Education provided - -   Weight and meniere's is an issue but touches something up and down stairs to catch his balance.  Depression Screen PHQ 2/9 Scores 07/27/2017 10/19/2015 12/18/2013  PHQ - 2 Score 0 0 0     Cognitive Function Ad8 score reviewed for issues:  Issues making decisions:  Less interest in hobbies / activities:  Repeats questions, stories (family complaining):  Trouble using ordinary gadgets (microwave, computer, phone):  Forgets the month or year:   Mismanaging finances:   Remembering appts:  Daily problems with thinking and/or memory: Ad8 score is=0    MMSE - Mini Mental State Exam 07/27/2017  Not completed: (No Data)        Immunization History  Administered Date(s)  Administered  . Influenza, High Dose Seasonal PF 04/14/2016  . Influenza,inj,Quad PF,6+ Mos 04/16/2014  . Influenza-Unspecified 05/25/2015, 05/24/2017  . Pneumococcal Conjugate-13 04/14/2016  . Pneumococcal Polysaccharide-23 04/23/2008, 12/18/2013  .  Td 12/13/2007    Qualifies for Shingles Vaccine? Screening Tests Health Maintenance  Topic Date Due  . Hepatitis C Screening  02-15-1946  . FOOT EXAM  07/27/2017 (Originally 12/12/1955)  . URINE MICROALBUMIN  08/27/2017 (Originally 12/12/1955)  . OPHTHALMOLOGY EXAM  08/11/2017  . TETANUS/TDAP  12/12/2017  . HEMOGLOBIN A1C  01/21/2018  . COLONOSCOPY  05/19/2019  . INFLUENZA VACCINE  Completed  . PNA vac Low Risk Adult  Completed        Plan:      PCP Notes   Health Maintenance Deferred any diabetes related metrics due to not appropriate To take a Tdap in 5 of this year Educated regarding shingrix   Abnormal Screens  States he is on flomax and is helping but still problematic   BMI 33 Agrees to start walking and get a fit bit  Referrals  none  Patient concerns; Having issues swallowing again and will fup with GI    Nurse Concerns; As noted   Next PCP apt Today        I have personally reviewed and noted the following in the patient's chart:   . Medical and social history . Use of alcohol, tobacco or illicit drugs  . Current medications and supplements . Functional ability and status . Nutritional status . Physical activity . Advanced directives . List of other physicians . Hospitalizations, surgeries, and ER visits in previous 12 months . Vitals . Screenings to include cognitive, depression, and falls . Referrals and appointments  In addition, I have reviewed and discussed with patient certain preventive protocols, quality metrics, and best practice recommendations. A written personalized care plan for preventive services as well as general preventive health recommendations were provided to patient.     Wynetta Fines, RN  07/27/2017

## 2017-07-27 NOTE — Progress Notes (Signed)
I have reviewed and agree with this plan of care

## 2017-07-27 NOTE — Patient Instructions (Addendum)
[name] , Thank you for taking time to come for your Medicare Wellness Visit. I appreciate your ongoing commitment to your health goals. Please review the following plan we discussed and let me know if I can assist you in the future.    A Tetanus is recommended every 10 years. Medicare covers a tetanus if you have a cut or wound; otherwise, there may be a charge. If you had not had a tetanus with pertusses, known as the Tdap, you can take this anytime.  Due in May  Shingrix is a vaccine for the prevention of Shingles in Adults 2 and older.  If you are on Medicare, you can request a prescription from your doctor to be filled at a pharmacy.  Please check with your benefits regarding applicable copays or out of pocket expenses.  The Shingrix is given in 2 vaccines approx 8 weeks apart. You must receive the 2nd dose prior to 6 months from receipt of the first.    These are the goals we discussed: Goals    . Patient Stated     To start walking this year Walks outdoors, [facility]; try to increase up to 30 minutes 5 days a week  Get a fit bit        This is a list of the screening recommended for you and due dates:  Health Maintenance  Topic Date Due  .  Hepatitis C: One time screening is recommended by Center for Disease Control  (CDC) for  adults born from [birth year] through 1965.   10-09-1945  . Complete foot exam   12/12/1955  . Urine Protein Check  12/12/1955  . Hemoglobin A1C  10/12/2016  . Flu Shot  02/21/2017  . Eye exam for diabetics  08/11/2017  . Tetanus Vaccine  12/12/2017  . Colon Cancer Screening  05/19/2019  . Pneumonia vaccines  Completed   Prevention of falls: Remove rugs or any tripping hazards in the home Use Non slip mats in bathtubs and showers Placing grab bars next to the toilet and or shower Placing handrails on both sides of the stair way Adding extra lighting in the home.   Personal safety issues reviewed:  1. Consider starting a  community watch program per [facility] 2.  Changes batteries is smoke detector and/or carbon monoxide detector  3.  If you have firearms; keep them in a safe place 4.  Wear protection when in the sun; Always wear sunscreen or a hat; It is good to have your doctor check your skin annually or review any new areas of concern 5. Driving safety; Keep in the right lane; stay 3 car lengths behind the car in front of you on the highway; look 3 times prior to pulling out; carry your cell phone everywhere you go!    Learn about the Yellow Dot program:  The program allows first responders at your emergency to have access to who your physician is, as well as your medications and medical conditions.  Citizens requesting the Yellow Dot Packages should contact Master Corporal Nunzio Cobbs at the [facility] [phone] for the first week of the program and beginning the week after Easter citizens should contact their Scientist, physiological.     Health Maintenance, Male A healthy lifestyle and preventive care is important for your health and wellness. Ask your health care provider about what schedule of regular examinations is right for you. What should I know about weight  and diet? Eat a Healthy Diet  Eat plenty of vegetables, fruits, whole grains, low-fat dairy products, and lean protein.  Do not eat a lot of foods high in solid fats, added sugars, or salt.  Maintain a Healthy Weight Regular exercise can help you achieve or maintain a healthy weight. You should:  Do at least 150 minutes of exercise each week. The exercise should increase your heart rate and make you sweat (moderate-intensity exercise).  Do strength-training exercises at least twice a week.  Watch Your Levels of Cholesterol and Blood Lipids  Have your blood tested for lipids and cholesterol every 5 years starting at 72 years of age. If you are at high risk for heart disease, you should start  having your blood tested when you are 72 years old. You may need to have your cholesterol levels checked more often if: ? Your lipid or cholesterol levels are high. ? You are older than 72 years of age. ? You are at high risk for heart disease.  What should I know about cancer screening? Many types of cancers can be detected early and may often be prevented. Lung Cancer  You should be screened every year for lung cancer if: ? You are a current smoker who has smoked for at least 30 years. ? You are a former smoker who has quit within the past 15 years.  Talk to your health care provider about your screening options, when you should start screening, and how often you should be screened.  Colorectal Cancer  Routine colorectal cancer screening usually begins at 72 years of age and should be repeated every 5-10 years until you are 72 years old. You may need to be screened more often if early forms of precancerous polyps or small growths are found. Your health care provider may recommend screening at an earlier age if you have risk factors for colon cancer.  Your health care provider may recommend using home test kits to check for hidden blood in the stool.  A small camera at the end of a tube can be used to examine your colon (sigmoidoscopy or colonoscopy). This checks for the earliest forms of colorectal cancer.  Prostate and Testicular Cancer  Depending on your age and overall health, your health care provider may do certain tests to screen for prostate and testicular cancer.  Talk to your health care provider about any symptoms or concerns you have about testicular or prostate cancer.  Skin Cancer  Check your skin from head to toe regularly.  Tell your health care provider about any new moles or changes in moles, especially if: ? There is a change in a mole's size, shape, or color. ? You have a mole that is larger than a pencil eraser.  Always use sunscreen. Apply sunscreen  liberally and repeat throughout the day.  Protect yourself by wearing long sleeves, pants, a wide-brimmed hat, and sunglasses when outside.  What should I know about heart disease, diabetes, and high blood pressure?  If you are 84-27 years of age, have your blood pressure checked every 3-5 years. If you are 60 years of age or older, have your blood pressure checked every year. You should have your blood pressure measured twice-once when you are at a hospital or clinic, and once when you are not at a hospital or clinic. Record the average of the two measurements. To check your blood pressure when you are not at a hospital or clinic, you can use: ? An automated blood  pressure machine at a pharmacy. ? A home blood pressure monitor.  Talk to your health care provider about your target blood pressure.  If you are between 75-16 years old, ask your health care provider if you should take aspirin to prevent heart disease.  Have regular diabetes screenings by checking your fasting blood sugar level. ? If you are at a normal weight and have a low risk for diabetes, have this test once every three years after the age of 20. ? If you are overweight and have a high risk for diabetes, consider being tested at a younger age or more often.  A one-time screening for abdominal aortic aneurysm (AAA) by ultrasound is recommended for men aged 59-75 years who are current or former smokers. What should I know about preventing infection? Hepatitis B If you have a higher risk for hepatitis B, you should be screened for this virus. Talk with your health care provider to find out if you are at risk for hepatitis B infection. Hepatitis C Blood testing is recommended for:  Everyone born from [birth year] through 1965.  Anyone with known risk factors for hepatitis C.  Sexually Transmitted Diseases (STDs)  You should be screened each year for STDs including gonorrhea and chlamydia if: ? You are sexually active and are  younger than 72 years of age. ? You are older than 72 years of age and your health care provider tells you that you are at risk for this type of infection. ? Your sexual activity has changed since you were last screened and you are at an increased risk for chlamydia or gonorrhea. Ask your health care provider if you are at risk.  Talk with your health care provider about whether you are at high risk of being infected with HIV. Your health care provider may recommend a prescription medicine to help prevent HIV infection.  What else can I do?  Schedule regular health, dental, and eye exams.  Stay current with your vaccines (immunizations).  Do not use any tobacco products, such as cigarettes, chewing tobacco, and e-cigarettes. If you need help quitting, ask your health care provider.  Limit alcohol intake to no more than 2 drinks per day. One drink equals 12 ounces of beer, 5 ounces of wine, or 1 ounces of hard liquor.  Do not use street drugs.  Do not share needles.  Ask your health care provider for help if you need support or information about quitting drugs.  Tell your health care provider if you often feel depressed.  Tell your health care provider if you have ever been abused or do not feel safe at home. This information is not intended to replace advice given to you by your health care provider. Make sure you discuss any questions you have with your health care provider. Document Released: 01/06/2008 Document Revised: 03/08/2016 Document Reviewed: 04/13/2015 Elsevier Interactive Patient Education  2018 [place] in the Home Falls can cause injuries and can affect people from all age groups. There are many simple things that you can do to make your home safe and to help prevent falls. What can I do on the outside of my home?  Regularly repair the edges of walkways and driveways and fix any cracks.  Remove high doorway thresholds.  Trim any shrubbery  on the main path into your home.  Use bright outdoor lighting.  Clear walkways of debris and clutter, including tools and rocks.  Regularly check that handrails are securely  fastened and in good repair. Both sides of any steps should have handrails.  Install guardrails along the edges of any raised decks or porches.  Have leaves, snow, and ice cleared regularly.  Use sand or salt on walkways during winter months.  In the garage, clean up any spills right away, including grease or oil spills. What can I do in the bathroom?  Use night lights.  Install grab bars by the toilet and in the tub and shower. Do not use towel bars as grab bars.  Use non-skid mats or decals on the floor of the tub or shower.  If you need to sit down while you are in the shower, use a plastic, non-slip stool.  Keep the floor dry. Immediately clean up any water that spills on the floor.  Remove soap buildup in the tub or shower on a regular basis.  Attach bath mats securely with double-sided non-slip rug tape.  Remove throw rugs and other tripping hazards from the floor. What can I do in the bedroom?  Use night lights.  Make sure that a bedside light is easy to reach.  Do not use oversized bedding that drapes onto the floor.  Have a firm chair that has side arms to use for getting dressed.  Remove throw rugs and other tripping hazards from the floor. What can I do in the kitchen?  Clean up any spills right away.  Avoid walking on wet floors.  Place frequently used items in easy-to-reach places.  If you need to reach for something above you, use a sturdy step stool that has a grab bar.  Keep electrical cables out of the way.  Do not use floor polish or wax that makes floors slippery. If you have to use wax, make sure that it is non-skid floor wax.  Remove throw rugs and other tripping hazards from the floor. What can I do in the stairways?  Do not leave any items on the stairs.  Make  sure that there are handrails on both sides of the stairs. Fix handrails that are broken or loose. Make sure that handrails are as long as the stairways.  Check any carpeting to make sure that it is firmly attached to the stairs. Fix any carpet that is loose or worn.  Avoid having throw rugs at the top or bottom of stairways, or secure the rugs with carpet tape to prevent them from moving.  Make sure that you have a light switch at the top of the stairs and the bottom of the stairs. If you do not have them, have them installed. What are some other fall prevention tips?  Wear closed-toe shoes that fit well and support your feet. Wear shoes that have rubber soles or low heels.  When you use a stepladder, make sure that it is completely opened and that the sides are firmly locked. Have someone hold the ladder while you are using it. Do not climb a closed stepladder.  Add color or contrast paint or tape to grab bars and handrails in your home. Place contrasting color strips on the first and last steps.  Use mobility aids as needed, such as canes, walkers, scooters, and crutches.  Turn on lights if it is dark. Replace any light bulbs that burn out.  Set up furniture so that there are clear paths. Keep the furniture in the same spot.  Fix any uneven floor surfaces.  Choose a carpet design that does not hide the edge of steps of  a stairway.  Be aware of any and all pets.  Review your medicines with your healthcare provider. Some medicines can cause dizziness or changes in blood pressure, which increase your risk of falling. Talk with your health care provider about other ways that you can decrease your risk of falls. This may include working with a physical therapist or trainer to improve your strength, balance, and endurance. This information is not intended to replace advice given to you by your health care provider. Make sure you discuss any questions you have with your health care  provider. Document Released: 06/30/2002 Document Revised: 12/07/2015 Document Reviewed: 08/14/2014 Elsevier Interactive Patient Education  [employer].

## 2017-08-08 ENCOUNTER — Ambulatory Visit (HOSPITAL_COMMUNITY)
Admission: RE | Admit: 2017-08-08 | Discharge: 2017-08-08 | Disposition: A | Discharge status: Home / Self Care | Payer: Medicare Other | Source: Ambulatory Visit | Attending: Cardiology | Admitting: Cardiology

## 2017-08-08 DIAGNOSIS — I714 Abdominal aortic aneurysm, without rupture, unspecified: Secondary | ICD-10-CM

## 2017-08-10 ENCOUNTER — Telehealth: Payer: Self-pay | Admitting: Internal Medicine

## 2017-08-10 NOTE — Telephone Encounter (Signed)
Returned call and she was wanting his results.  I let her know it was unchanged from last study.

## 2017-08-10 NOTE — Telephone Encounter (Signed)
New Message  Pt wife call to schedule a vascular test for pt. Please call back to discuss

## 2017-08-10 NOTE — Telephone Encounter (Signed)
Final Interpretation: Abdominal Aorta: Stable infrarenal fusiform dilatation measuring 2.3 cm by 2.4 cm. The largest aortic diameter remains essentially unchanged compared to prior exam. Previous diameter measurement was 2.4 cm obtained on 07/13/16.

## 2017-10-09 ENCOUNTER — Other Ambulatory Visit: Payer: Self-pay | Admitting: Adult Health

## 2017-10-10 NOTE — Telephone Encounter (Signed)
Sent to the pharmacy by e-scribe. 

## 2017-11-21 ENCOUNTER — Ambulatory Visit (INDEPENDENT_AMBULATORY_CARE_PROVIDER_SITE_OTHER): Payer: Medicare Other | Admitting: Adult Health

## 2017-11-21 ENCOUNTER — Encounter: Payer: Self-pay | Admitting: Adult Health

## 2017-11-21 VITALS — BP 152/86 | Temp 98.1°F | Wt 222.0 lb

## 2017-11-21 DIAGNOSIS — M25551 Pain in right hip: Secondary | ICD-10-CM

## 2017-11-21 DIAGNOSIS — Z760 Encounter for issue of repeat prescription: Secondary | ICD-10-CM

## 2017-11-21 MED ORDER — TAMSULOSIN HCL 0.4 MG PO CAPS: 0.4000 mg | ORAL_CAPSULE | Freq: Every day | ORAL | 3 refills | Status: DC

## 2017-11-21 NOTE — Progress Notes (Signed)
Subjective:    Patient ID: Daniel Guerrero, male    DOB: Nov 11, 1945, 72 y.o.   MRN: [id number]  HPI  72 year old male who  has a past medical history of Allergy, Cataract of both eyes, Eczema, Enlarged aorta (HCC), Erectile dysfunction, Hearing loss, History of inguinal hernia repair, bilateral, Hypertension, Kidney stones, Loss of hearing, Meniere disease, Obesity, Paroxysmal SVT (supraventricular tachycardia) ([place]), Psoriasis, S/P appendectomy, S/P tonsillectomy, Seasonal allergies, and Type II or unspecified type diabetes mellitus without mention of complication, uncontrolled.  He presents to the office today for the chronic issue of right hip pain. He reports having pain in right hip for the last 10 years. Pain varies in intensity but he feels as though the intensity has been becoming worse over the last few weeks. He went to [place] and did a lot of walking which he thinks aggravated the pain. He is using Advil for pain relief, which helps temporarily. Pain is worse when going up steps and when he has to get into his SUV. He reports that he manually has to lift right leg into his car.    Denies any radiating pain or numbness and tingling in his right leg.   Review of Systems See HPI   Past Medical History:  Diagnosis Date  . Allergy   . Cataract of both eyes    implants both eyes   . Eczema   . Enlarged aorta ([place])   . Erectile dysfunction   . Hearing loss    high frequency  . History of inguinal hernia repair, bilateral   . Hypertension   . Kidney stones   . Loss of hearing    high frequency  . Meniere disease   . Obesity   . Paroxysmal SVT (supraventricular tachycardia) ([place])   . Psoriasis   . S/P appendectomy   . S/P tonsillectomy   . Seasonal allergies   . Type II or unspecified type diabetes mellitus without mention of complication, uncontrolled    12-29-14 Told he was prediabetic once    Social History   Socioeconomic History  . Marital status: Married    Spouse  name: Not on file  . Number of children: Not on file  . Years of education: Not on file  . Highest education level: Not on file  Occupational History  . Not on file  Social Needs  . Financial resource strain: Not on file  . Food insecurity:    Worry: Not on file    Inability: Not on file  . Transportation needs:    Medical: Not on file    Non-medical: Not on file  Tobacco Use  . Smoking status: Former Smoker    Last attempt to quit: 07/24/1964    Years since quitting: 53.3  . Smokeless tobacco: Never Used  . Tobacco comment: Quit 20-30 years ago; smoked pipe and cigars   Substance and Sexual Activity  . Alcohol use: Yes    Comment: occasional; couple beers/day  . Drug use: No  . Sexual activity: Not on file  Lifestyle  . Physical activity:    Days per week: Not on file    Minutes per session: Not on file  . Stress: Not on file  Relationships  . Social connections:    Talks on phone: Not on file    Gets together: Not on file    Attends religious service: Not on file    Active member of club or organization: Not on file  Attends meetings of clubs or organizations: Not on file    Relationship status: Not on file  . Intimate partner violence:    Fear of current or ex partner: Not on file    Emotionally abused: Not on file    Physically abused: Not on file    Forced sexual activity: Not on file  Other Topics Concern  . Not on file  Social History Narrative   Daily Caffeine use: 5-6 drinks daily   Exercising 2 times per week   No Drug use          Past Surgical History:  Procedure Laterality Date  . Ablasion    . APPENDECTOMY    . COLONOSCOPY     unsure where or when- states greater than 10 yrs ago per pt- was normal per pt and his wife   . CYSTOSCOPY WITH RETROGRADE PYELOGRAM, URETEROSCOPY AND STENT PLACEMENT Left 12/30/2014   Procedure: CYSTOSCOPY WITH LEFT URETEROSCOPY STONE EXTRACTION WITH STENT;  Surgeon: Franchot Gallo, MD;  Location: [facility];  Service:  Urology;  Laterality: Left;  . ELECTROPHYSIOLOGIC STUDY N/A 08/24/2015   Procedure: SVT Ablation;  Surgeon: Thompson Grayer, MD;  Location: [facility];  Service: Cardiovascular;  Laterality: N/A;  . ENDOVENOUS ABLATION SAPHENOUS VEIN W/ LASER Right 12/04/2016   endovenous laser ablation right greater saphenous vein by Tinnie Gens MD   . ESOPHAGOGASTRODUODENOSCOPY N/A 08/18/2016   Procedure: ESOPHAGOGASTRODUODENOSCOPY (EGD);  Surgeon: [name], MD;  Location: [facility];  Service: Endoscopy;  Laterality: N/A;  . ESOPHAGOGASTRODUODENOSCOPY N/A 09/06/2016   Procedure: ESOPHAGOGASTRODUODENOSCOPY (EGD);  Surgeon: [name], MD;  Location: [facility];  Service: Endoscopy;  Laterality: N/A;  100 - moved to 2/14 @ 2:15 per Tretha Sciara  . EYE SURGERY     Catracts removed both eye 30 yrs ago  . HERNIA REPAIR     right and left  . HOLMIUM LASER APPLICATION Left 03/27/8545   Procedure: HOLMIUM LASER APPLICATION;  Surgeon: Franchot Gallo, MD;  Location: [facility];  Service: Urology;  Laterality: Left;  Marland Kitchen MALONEY DILATION N/A 09/06/2016   Procedure: Venia Minks DILATION;  Surgeon: [name], MD;  Location: [facility];  Service: Endoscopy;  Laterality: N/A;  . TONSILLECTOMY      Family History  Problem Relation Age of Onset  . Cancer Mother        lung  . Thyroid disease Mother   . Diabetes Father   . GI Bleed Father   . Aneurysm Father   . AAA (abdominal aortic aneurysm) Father   . Colon cancer Neg Hx   . Colon polyps Neg Hx   . Rectal cancer Neg Hx   . Stomach cancer Neg Hx     Allergies  Allergen Reactions  . Peanut-Containing Drug Products Hives  . Penicillins Hives    Has patient had a PCN reaction causing immediate rash, facial/tongue/throat swelling, SOB or lightheadedness with hypotension: No Has patient had a PCN reaction causing severe rash involving mucus membranes or skin necrosis: No Has patient had a PCN reaction that required hospitalization No Has patient  had a PCN reaction occurring within the last 10 years: No If all of the above answers are "NO", then may proceed with Cephalosporin use.    Current Outpatient Medications on File Prior to Visit  Medication Sig Dispense Refill  . amLODipine (NORVASC) 5 MG tablet Take 1 tablet by mouth daily.  Due for physical with Dexter Sauser 90 tablet 1  . aspirin  81 MG chewable tablet Chew 81 mg by mouth daily.    Marland Kitchen b complex vitamins tablet Take 1 tablet by mouth daily.    . Cyanocobalamin (VITAMIN B-12 PO) Take 1 tablet by mouth daily.    . fluticasone (FLONASE) 50 MCG/ACT nasal spray Place 2 sprays into both nostrils daily as needed for allergies or rhinitis.    Marland Kitchen ibuprofen (ADVIL,MOTRIN) 400 MG tablet Take 1 tablet (400 mg total) by mouth every 6 (six) hours as needed. 30 tablet 0  . lansoprazole (PREVACID) 30 MG capsule Take 1 capsule (30 mg total) by mouth daily. 90 capsule 3  . loratadine (CLARITIN) 10 MG tablet Take 10 mg by mouth daily as needed for allergies.    Marland Kitchen triamcinolone ointment (KENALOG) 0.5 % Apply 1 application topically 2 (two) times daily. (Patient taking differently: Apply 1 application topically 2 (two) times daily as needed. ) 30 g 0  . vitamin C (ASCORBIC ACID) 500 MG tablet Take 500 mg by mouth daily.     No current facility-administered medications on file prior to visit.     BP (!) 152/86   Temp 98.1 F (36.7 C) (Oral)   Wt 222 lb (100.7 kg)   BMI 31.85 kg/m       Objective:   Physical Exam  Constitutional: He is oriented to person, place, and time. He appears well-developed and well-nourished. No distress.  Cardiovascular: Normal rate, regular rhythm, normal heart sounds and intact distal pulses. Exam reveals no gallop and no friction rub.  No murmur heard. Pulmonary/Chest: Effort normal and breath sounds normal.  Musculoskeletal: Normal range of motion. He exhibits tenderness. He exhibits no edema.  Pain with internal and external rotation of right leg. No discomfort with  straight leg raise or knee to chest. No pain with palpation.     Neurological: He is alert and oriented to person, place, and time.  Skin: Skin is warm. Capillary refill takes less than 2 seconds. He is not diaphoretic.  Psychiatric: He has a normal mood and affect. His behavior is normal. Judgment and thought content normal.  Vitals reviewed.     Assessment & Plan:  1. Right hip pain - concern for labral tear vs osteoarthritis. Will order MRI and likely refer to orthopedics.  - Can continue Advil  - MR HIP RIGHT WO CONTRAST; Future  2. Medication refill - tamsulosin (FLOMAX) 0.4 MG CAPS capsule; Take 1 capsule (0.4 mg total) by mouth daily.  Dispense: 90 capsule; Refill: 3   Dorothyann Peng, NP

## 2017-12-07 ENCOUNTER — Ambulatory Visit
Admission: RE | Admit: 2017-12-07 | Discharge: 2017-12-07 | Disposition: A | Discharge status: Home / Self Care | Payer: Medicare Other | Source: Ambulatory Visit | Attending: Adult Health | Admitting: Adult Health

## 2017-12-07 DIAGNOSIS — M25551 Pain in right hip: Secondary | ICD-10-CM | POA: Diagnosis not present

## 2017-12-12 ENCOUNTER — Other Ambulatory Visit: Payer: Self-pay | Admitting: Family Medicine

## 2017-12-12 DIAGNOSIS — M5136 Other intervertebral disc degeneration, lumbar region: Secondary | ICD-10-CM

## 2017-12-28 DIAGNOSIS — M545 Low back pain: Secondary | ICD-10-CM | POA: Diagnosis not present

## 2017-12-28 DIAGNOSIS — M1611 Unilateral primary osteoarthritis, right hip: Secondary | ICD-10-CM | POA: Diagnosis not present

## 2017-12-28 DIAGNOSIS — M5136 Other intervertebral disc degeneration, lumbar region: Secondary | ICD-10-CM | POA: Diagnosis not present

## 2017-12-28 DIAGNOSIS — M48061 Spinal stenosis, lumbar region without neurogenic claudication: Secondary | ICD-10-CM | POA: Diagnosis not present

## 2018-01-04 ENCOUNTER — Other Ambulatory Visit: Payer: Self-pay | Admitting: *Deleted

## 2018-01-04 DIAGNOSIS — M545 Low back pain: Secondary | ICD-10-CM | POA: Diagnosis not present

## 2018-01-04 DIAGNOSIS — I714 Abdominal aortic aneurysm, without rupture, unspecified: Secondary | ICD-10-CM

## 2018-01-08 DIAGNOSIS — H9042 Sensorineural hearing loss, unilateral, left ear, with unrestricted hearing on the contralateral side: Secondary | ICD-10-CM | POA: Diagnosis not present

## 2018-01-11 ENCOUNTER — Other Ambulatory Visit (HOSPITAL_COMMUNITY): Payer: Medicare Other

## 2018-01-11 DIAGNOSIS — M545 Low back pain: Secondary | ICD-10-CM | POA: Diagnosis not present

## 2018-01-11 DIAGNOSIS — M4187 Other forms of scoliosis, lumbosacral region: Secondary | ICD-10-CM | POA: Diagnosis not present

## 2018-01-11 DIAGNOSIS — M5136 Other intervertebral disc degeneration, lumbar region: Secondary | ICD-10-CM | POA: Diagnosis not present

## 2018-01-11 DIAGNOSIS — M48061 Spinal stenosis, lumbar region without neurogenic claudication: Secondary | ICD-10-CM | POA: Diagnosis not present

## 2018-01-16 ENCOUNTER — Other Ambulatory Visit: Payer: Self-pay | Admitting: Otolaryngology

## 2018-01-16 DIAGNOSIS — H903 Sensorineural hearing loss, bilateral: Secondary | ICD-10-CM

## 2018-01-17 ENCOUNTER — Ambulatory Visit (HOSPITAL_COMMUNITY)
Admission: RE | Admit: 2018-01-17 | Discharge: 2018-01-17 | Disposition: A | Discharge status: Home / Self Care | Payer: Medicare Other | Source: Ambulatory Visit | Attending: Internal Medicine | Admitting: Internal Medicine

## 2018-01-17 DIAGNOSIS — I714 Abdominal aortic aneurysm, without rupture, unspecified: Secondary | ICD-10-CM

## 2018-01-17 DIAGNOSIS — E785 Hyperlipidemia, unspecified: Secondary | ICD-10-CM | POA: Insufficient documentation

## 2018-01-17 DIAGNOSIS — Z87891 Personal history of nicotine dependence: Secondary | ICD-10-CM | POA: Diagnosis not present

## 2018-01-17 DIAGNOSIS — I10 Essential (primary) hypertension: Secondary | ICD-10-CM | POA: Insufficient documentation

## 2018-01-25 ENCOUNTER — Ambulatory Visit
Admission: RE | Admit: 2018-01-25 | Discharge: 2018-01-25 | Disposition: A | Discharge status: Home / Self Care | Payer: Medicare Other | Source: Ambulatory Visit | Attending: Otolaryngology | Admitting: Otolaryngology

## 2018-01-25 DIAGNOSIS — H903 Sensorineural hearing loss, bilateral: Secondary | ICD-10-CM | POA: Diagnosis not present

## 2018-01-25 MED ORDER — GADOBENATE DIMEGLUMINE 529 MG/ML IV SOLN
20.0000 mL | Freq: Once | INTRAVENOUS | Status: AC | PRN
  Administered 2018-01-25: 20 mL via INTRAVENOUS

## 2018-01-28 ENCOUNTER — Encounter: Payer: Self-pay | Admitting: Internal Medicine

## 2018-01-28 DIAGNOSIS — H903 Sensorineural hearing loss, bilateral: Secondary | ICD-10-CM | POA: Diagnosis not present

## 2018-01-28 DIAGNOSIS — H8103 Meniere's disease, bilateral: Secondary | ICD-10-CM | POA: Diagnosis not present

## 2018-01-31 ENCOUNTER — Other Ambulatory Visit: Payer: Self-pay | Admitting: Cardiothoracic Surgery

## 2018-01-31 DIAGNOSIS — I712 Thoracic aortic aneurysm, without rupture, unspecified: Secondary | ICD-10-CM

## 2018-02-04 ENCOUNTER — Telehealth: Payer: Self-pay

## 2018-02-04 DIAGNOSIS — R935 Abnormal findings on diagnostic imaging of other abdominal regions, including retroperitoneum: Secondary | ICD-10-CM

## 2018-02-04 NOTE — Telephone Encounter (Signed)
Order put in to refer to Dr. Donnetta Hutching for abdominal aortic aneurysm.

## 2018-02-06 ENCOUNTER — Encounter: Payer: Self-pay | Admitting: Family Medicine

## 2018-02-06 ENCOUNTER — Other Ambulatory Visit: Payer: Self-pay | Admitting: Cardiothoracic Surgery

## 2018-02-06 DIAGNOSIS — I712 Thoracic aortic aneurysm, without rupture, unspecified: Secondary | ICD-10-CM

## 2018-02-06 DIAGNOSIS — H903 Sensorineural hearing loss, bilateral: Secondary | ICD-10-CM | POA: Insufficient documentation

## 2018-02-06 DIAGNOSIS — H905 Unspecified sensorineural hearing loss: Secondary | ICD-10-CM | POA: Insufficient documentation

## 2018-02-06 HISTORY — DX: Sensorineural hearing loss, bilateral: H90.3

## 2018-02-26 ENCOUNTER — Other Ambulatory Visit: Payer: Self-pay | Admitting: Adult Health

## 2018-02-26 DIAGNOSIS — I10 Essential (primary) hypertension: Secondary | ICD-10-CM

## 2018-02-27 NOTE — Telephone Encounter (Signed)
Sent to the pharmacy by e-scribe. 

## 2018-03-04 ENCOUNTER — Encounter (INDEPENDENT_AMBULATORY_CARE_PROVIDER_SITE_OTHER): Payer: Self-pay

## 2018-03-04 ENCOUNTER — Encounter: Payer: Self-pay | Admitting: Vascular Surgery

## 2018-03-04 ENCOUNTER — Ambulatory Visit (INDEPENDENT_AMBULATORY_CARE_PROVIDER_SITE_OTHER): Payer: Medicare Other | Admitting: Vascular Surgery

## 2018-03-04 VITALS — BP 168/92 | HR 57 | Temp 98.7°F | Resp 20 | Wt 230.0 lb

## 2018-03-04 DIAGNOSIS — I714 Abdominal aortic aneurysm, without rupture, unspecified: Secondary | ICD-10-CM

## 2018-03-04 NOTE — Progress Notes (Signed)
[facility] [place]  Patient name: Daniel Guerrero MRN: [id number] DOB: May 24, 1946 Sex: male   Seen today in our [place] office  REASON FOR VISIT: Follow-up abdominal aortic aneurysm  HPI: Daniel Guerrero is a 72 y.o. male here for discussion of abdominal aortic aneurysm.  He is here today with his wife.  He reports that this was found incidentally with MRI scan.  He reports that his father died of a ruptured aneurysm.  Seen in our practice by Dr. Kellie Simmering in the past with treatment of right leg venous varicosities and is done well from this.  Past Medical History:  Diagnosis Date  . Allergy   . Cataract of both eyes    implants both eyes   . Eczema   . Enlarged aorta ([place])   . Erectile dysfunction   . Hearing loss    high frequency  . History of inguinal hernia repair, bilateral   . Hypertension   . Kidney stones   . Loss of hearing    high frequency  . Meniere disease   . Obesity   . Paroxysmal SVT (supraventricular tachycardia) ([place])   . Psoriasis   . S/P appendectomy   . S/P tonsillectomy   . Seasonal allergies   . Type II or unspecified type diabetes mellitus without mention of complication, uncontrolled    12-29-14 Told he was prediabetic once    Family History  Problem Relation Age of Onset  . Cancer Mother        lung  . Thyroid disease Mother   . Diabetes Father   . GI Bleed Father   . Aneurysm Father   . AAA (abdominal aortic aneurysm) Father   . Colon cancer Neg Hx   . Colon polyps Neg Hx   . Rectal cancer Neg Hx   . Stomach cancer Neg Hx     SOCIAL HISTORY: Social History   Tobacco Use  . Smoking status: Former Smoker    Last attempt to quit: 07/24/1964    Years since quitting: 53.6  . Smokeless tobacco: Never Used  . Tobacco comment: Quit 20-30 years ago; smoked pipe and cigars   Substance Use Topics  . Alcohol use: Yes    Comment: occasional; couple beers/day    Allergies    Allergen Reactions  . Peanut-Containing Drug Products Hives  . Penicillins Hives    Has patient had a PCN reaction causing immediate rash, facial/tongue/throat swelling, SOB or lightheadedness with hypotension: No Has patient had a PCN reaction causing severe rash involving mucus membranes or skin necrosis: No Has patient had a PCN reaction that required hospitalization No Has patient had a PCN reaction occurring within the last 10 years: No If all of the above answers are "NO", then may proceed with Cephalosporin use.    Current Outpatient Medications  Medication Sig Dispense Refill  . amLODipine (NORVASC) 5 MG tablet TAKE ONE TABLET BY MOUTH DAILY (DUE FOR PHYSICAL WITH [facility]) 90 tablet 1  . b complex vitamins tablet Take 1 tablet by mouth daily.    . Cyanocobalamin (VITAMIN B-12 PO) Take 1 tablet by mouth daily.    . fluticasone (FLONASE) 50 MCG/ACT nasal spray Place 2 sprays into both nostrils daily as needed for allergies or rhinitis.    Marland Kitchen ibuprofen (ADVIL,MOTRIN) 400 MG tablet Take 1 tablet (400 mg total) by mouth every 6 (six) hours as needed. 30 tablet 0  . lansoprazole (PREVACID) 30 MG capsule Take 1 capsule (30  mg total) by mouth daily. 90 capsule 3  . loratadine (CLARITIN) 10 MG tablet Take 10 mg by mouth daily as needed for allergies.    . tamsulosin (FLOMAX) 0.4 MG CAPS capsule Take 1 capsule (0.4 mg total) by mouth daily. 90 capsule 3  . triamcinolone ointment (KENALOG) 0.5 % Apply 1 application topically 2 (two) times daily. (Patient taking differently: Apply 1 application topically 2 (two) times daily as needed. ) 30 g 0  . vitamin C (ASCORBIC ACID) 500 MG tablet Take 500 mg by mouth daily.    Marland Kitchen aspirin 81 MG chewable tablet Chew 81 mg by mouth daily.     No current facility-administered medications for this visit.     REVIEW OF SYSTEMS:  [X]  denotes positive finding, [ ]  denotes negative finding Cardiac  Comments:  Chest pain or chest pressure:    Shortness of breath  upon exertion:    Short of breath when lying flat:    Irregular heart rhythm: x       Vascular    Pain in calf, thigh, or hip brought on by ambulation:    Pain in feet at night that wakes you up from your sleep:     Blood clot in your veins:    Leg swelling:  x         PHYSICAL EXAM: Vitals:   03/04/18 0940 03/04/18 0942  BP: (!) 171/95 (!) 168/92  Pulse: (!) 56 (!) 57  Resp: 20   Temp: 98.7 F (37.1 C)   TempSrc: Temporal   Weight: 230 lb (104.3 kg)     GENERAL: The patient is a well-nourished male, in no acute distress. The vital signs are documented above. CARDIOVASCULAR: Carotid arteries without bruits bilaterally.  2+ radial pulses.  2+ femoral pulses.  Very prominent popliteal pulses which feel aneurysmal.  2+ dorsalis pedis pulses bilaterally. Abdomen soft nontender obese and I cannot palpate an aneurysm PULMONARY: There is good air exchange  MUSCULOSKELETAL: There are no major deformities or cyanosis. NEUROLOGIC: No focal weakness or paresthesias are detected. SKIN: There are no ulcers or rashes noted. PSYCHIATRIC: The patient has a normal affect.  DATA:  Ultrasound of his aorta was reviewed.  Maximal diameter is 3.2 cm.  MEDICAL ISSUES: I discussed the significance of his abdominal aortic aneurysm at length with the patient and his wife present.  Explained that this is extremely small dilatation and would recommend repeat ultrasound in 2 years.  Report that he has a slight lifelong risk of progressing to a size where this would require elective repair.  I did discuss symptoms of aneurysmal rupture but explained that this would be extremely unlikely.   His goal exam he does have a very prominent popliteal arteries and he either has ectasia or aneurysms of his popliteal arteries.  We will obtain a carotid duplex in the next several weeks at [facility] and will discuss this with him.  If this is of significant concern will schedule office follow-up for further  discussion.  If these are ectatic only we will continue surveillance of these with ultrasound in the future    Rosetta Posner, MD [facility] Tel [phone] Pager [phone]

## 2018-03-07 ENCOUNTER — Ambulatory Visit: Payer: Medicare Other | Admitting: Cardiothoracic Surgery

## 2018-03-07 ENCOUNTER — Other Ambulatory Visit: Payer: Medicare Other

## 2018-03-14 ENCOUNTER — Other Ambulatory Visit: Payer: Medicare Other

## 2018-03-14 ENCOUNTER — Ambulatory Visit
Admission: RE | Admit: 2018-03-14 | Discharge: 2018-03-14 | Disposition: A | Discharge status: Home / Self Care | Payer: Medicare Other | Source: Ambulatory Visit | Attending: Cardiothoracic Surgery | Admitting: Cardiothoracic Surgery

## 2018-03-14 DIAGNOSIS — I712 Thoracic aortic aneurysm, without rupture, unspecified: Secondary | ICD-10-CM

## 2018-03-14 MED ORDER — IOPAMIDOL (ISOVUE-370) INJECTION 76%
75.0000 mL | Freq: Once | INTRAVENOUS | Status: AC | PRN
  Administered 2018-03-14: 75 mL via INTRAVENOUS

## 2018-03-19 ENCOUNTER — Other Ambulatory Visit: Payer: Medicare Other

## 2018-03-19 ENCOUNTER — Ambulatory Visit (HOSPITAL_COMMUNITY): Payer: Medicare Other

## 2018-03-19 ENCOUNTER — Encounter: Payer: Medicare Other | Admitting: Vascular Surgery

## 2018-03-20 ENCOUNTER — Ambulatory Visit (HOSPITAL_COMMUNITY)
Admission: RE | Admit: 2018-03-20 | Discharge: 2018-03-20 | Disposition: A | Discharge status: Home / Self Care | Payer: Medicare Other | Source: Ambulatory Visit | Attending: Vascular Surgery | Admitting: Vascular Surgery

## 2018-03-20 DIAGNOSIS — I724 Aneurysm of artery of lower extremity: Secondary | ICD-10-CM | POA: Diagnosis not present

## 2018-03-20 DIAGNOSIS — I714 Abdominal aortic aneurysm, without rupture, unspecified: Secondary | ICD-10-CM

## 2018-03-20 DIAGNOSIS — Z87891 Personal history of nicotine dependence: Secondary | ICD-10-CM | POA: Diagnosis not present

## 2018-04-11 ENCOUNTER — Ambulatory Visit (INDEPENDENT_AMBULATORY_CARE_PROVIDER_SITE_OTHER): Payer: Medicare Other | Admitting: Cardiothoracic Surgery

## 2018-04-11 VITALS — BP 140/86 | HR 50 | Resp 20 | Ht 70.0 in | Wt 227.0 lb

## 2018-04-11 DIAGNOSIS — I712 Thoracic aortic aneurysm, without rupture, unspecified: Secondary | ICD-10-CM

## 2018-04-11 DIAGNOSIS — I77810 Thoracic aortic ectasia: Secondary | ICD-10-CM

## 2018-04-11 NOTE — Progress Notes (Signed)
[address]       [place] [address]             [phone]                    Nazire S Mcmichael Garrison Medical Record #[id number] Date of Birth: [birth year]  Referring: Thompson Grayer, MD Primary Care: Dorothyann Peng, NP  Chief Complaint:    Mildly dilated aortic root   History of Present Illness:    Daniel Guerrero 72 y.o. male is seen in the office  Today for follow-up of mildly dilated aortic root found incidentally on an echocardiogram.  Patient notes that he's had episodes of fast heart rate since his teens.  Dr. Rayann Heman performed cardiac ablation.  His work-up prior to ablation echocardiogram was performed. Echo 06/10/15 demonstrated EF 60-65%, mildly to moderately dilated aortic root, LA 40.  Because of the mildly dilated aortic root on echocardiogram a CTA of the chest was performed and the patient was referred to cardiac surgery  2 years ago  His echocardiogram demonstrates a trileaflet aortic valve.  Patient notes that originally he had a cardiologist in [place] but no longer wishes to be followed in [place] for cardiology issues and would like to come to the [facility] system.  He lives in [place] and would like to be seen by the cardiology office there.   He has no family history of aortic dissection or sudden death at a young age of unexplained cause. His father did not of her ruptured abdominal aneurysm at age 9, and nonoperative treatment was decided upon.   He was recently seen by vascular surgery for follow-up of abdominal aortic aneurysm.    Current Activity/ Functional Status:  Patient is independent with mobility/ambulation, transfers, ADL's, IADL's.   Zubrod Score: At the time of surgery this patient's most appropriate activity status/level should be described as: []     0    Normal activity, no symptoms [x]     1    Restricted in physical strenuous activity but ambulatory, able to do out light work []     2    Ambulatory and  capable of self care, unable to do work activities, up and about               >50 % of waking hours                              []     3    Only limited self care, in bed greater than 50% of waking hours []     4    Completely disabled, no self care, confined to bed or chair []     5    Moribund   Past Medical History:  Diagnosis Date  . Allergy   . Cataract of both eyes    implants both eyes   . Eczema   . Enlarged aorta ([place])   . Erectile dysfunction   . Hearing loss    high frequency  . History of inguinal hernia repair, bilateral   . Hypertension   . Kidney stones   . Loss of hearing    high frequency  . Meniere disease   . Obesity   . Paroxysmal SVT (supraventricular tachycardia) ([place])   . Psoriasis   . S/P appendectomy   . S/P tonsillectomy   . Seasonal allergies   . Type II or unspecified  type diabetes mellitus without mention of complication, uncontrolled    12-29-14 Told he was prediabetic once    Past Surgical History:  Procedure Laterality Date  . Ablasion    . APPENDECTOMY    . COLONOSCOPY     unsure where or when- states greater than 10 yrs ago per pt- was normal per pt and his wife   . CYSTOSCOPY WITH RETROGRADE PYELOGRAM, URETEROSCOPY AND STENT PLACEMENT Left 12/30/2014   Procedure: CYSTOSCOPY WITH LEFT URETEROSCOPY STONE EXTRACTION WITH STENT;  Surgeon: Franchot Gallo, MD;  Location: [facility];  Service: Urology;  Laterality: Left;  . ELECTROPHYSIOLOGIC STUDY N/A 08/24/2015   Procedure: SVT Ablation;  Surgeon: Thompson Grayer, MD;  Location: [facility];  Service: Cardiovascular;  Laterality: N/A;  . ENDOVENOUS ABLATION SAPHENOUS VEIN W/ LASER Right 12/04/2016   endovenous laser ablation right greater saphenous vein by Tinnie Gens MD   . ESOPHAGOGASTRODUODENOSCOPY N/A 08/18/2016   Procedure: ESOPHAGOGASTRODUODENOSCOPY (EGD);  Surgeon: [name], MD;  Location[facility];  Service: Endoscopy;  Laterality: N/A;  . ESOPHAGOGASTRODUODENOSCOPY N/A  09/06/2016   Procedure: ESOPHAGOGASTRODUODENOSCOPY (EGD);  Surgeon: [name], MD;  [facility];  Service: Endoscopy;  Laterality: N/A;  100 - moved to 2/14 @ 2:15 per Tretha Sciara  . EYE SURGERY     Cat[place]s removed both eye 30 yrs ago  . HERNIA REPAIR     right and left  . HOLMIUM LASER APPLICATION Left 09/27/1060   Procedure: HOLMIUM LASER APPLICATION;  Surgeon: Franchot Gallo, MD;  Location: [facility];  Service: Urology;  Laterality: Left;  Marland Kitchen MALONEY DILATION N/A 09/06/2016   Procedure: Venia Minks DILATION;  Surgeon: [name], MD;  [facility];  Service: Endoscopy;  Laterality: N/A;  . TONSILLECTOMY      Family History  Problem Relation Age of Onset  . Cancer Mother        lung  . Thyroid disease Mother   . Diabetes Father   . GI Bleed Father   . Aneurysm Father   . AAA (abdominal aortic aneurysm) Father   . Colon cancer Neg Hx   . Colon polyps Neg Hx   . Rectal cancer Neg Hx   . Stomach cancer Neg Hx    Patient's mother died at age 84 of lung cancer he has one sister and one brother are healthy Social History   Social History  . Marital Status: Married    Spouse Name: N/A  . Number of Children: N/A  . Years of Education: N/A   Occupational History  .  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 Smoking status: Former Research scientist (life sciences)  . Smokeless tobacco: Never Used     Comment: Quit 20-30 years ago  . Alcohol Use: 25.2 oz/week    7 Glasses of wine, 28 Cans of beer, 7 Shots of liquor per week     Comment: occasional  . Drug Use: No  . Sexual Activity: Not on file          Social History Narrative   Daily Caffeine use: 5-6 drinks daily   Exercising 2 times per week   No Drug use             Social History   Tobacco Use  Smoking Status Former Smoker  . Last attempt to quit: 07/24/1964  . Years since quitting: 53.7  Smokeless Tobacco Never Used  Tobacco Comment   Quit  20-30  years ago; smoked pipe and cigars     Social History   Substance and Sexual Activity  Alcohol Use Yes   Comment: occasional; couple beers/day     Allergies  Allergen Reactions  . Peanut-Containing Drug Products Hives  . Penicillins Hives    Has patient had a PCN reaction causing immediate rash, facial/tongue/throat swelling, SOB or lightheadedness with hypotension: No Has patient had a PCN reaction causing severe rash involving mucus membranes or skin necrosis: No Has patient had a PCN reaction that required hospitalization No Has patient had a PCN reaction occurring within the last 10 years: No If all of the above answers are "NO", then may proceed with Cephalosporin use.    Current Outpatient Medications  Medication Sig Dispense Refill  . amLODipine (NORVASC) 5 MG tablet TAKE ONE TABLET BY MOUTH DAILY (DUE FOR PHYSICAL WITH CORY) 90 tablet 1  . aspirin 81 MG chewable tablet Chew 81 mg by mouth daily.    Marland Kitchen b complex vitamins tablet Take 1 tablet by mouth daily.    . Cyanocobalamin (VITAMIN B-12 PO) Take 1 tablet by mouth daily.    . fluticasone (FLONASE) 50 MCG/ACT nasal spray Place 2 sprays into both nostrils daily as needed for allergies or rhinitis.    Marland Kitchen ibuprofen (ADVIL,MOTRIN) 400 MG tablet Take 1 tablet (400 mg total) by mouth every 6 (six) hours as needed. 30 tablet 0  . lansoprazole (PREVACID) 30 MG capsule Take 1 capsule (30 mg total) by mouth daily. 90 capsule 3  . loratadine (CLARITIN) 10 MG tablet Take 10 mg by mouth daily as needed for allergies.    . tamsulosin (FLOMAX) 0.4 MG CAPS capsule Take 1 capsule (0.4 mg total) by mouth daily. 90 capsule 3  . triamcinolone ointment (KENALOG) 0.5 % Apply 1 application topically 2 (two) times daily. (Patient taking differently: Apply 1 application topically 2 (two) times daily as needed. ) 30 g 0  . vitamin C (ASCORBIC ACID) 500 MG tablet Take 500 mg by mouth daily.     No current facility-administered medications for this  visit.       Review of Systems:     Cardiac Review of Systems: Y or N  Chest Pain [ N  ]  Resting SOB Aqua.Slicker  ] Exertional SOB  [Y ]  Vertell Limber Aqua.Slicker ]   Pedal Edema Aqua.Slicker  ]    Palpitations Jazmín.Cullens ] Syncope  [one episode  ]   Presyncope [ N ]  General Review of Systems: [Y] = yes [  ]=no Constitional: recent weight change [  ];  Wt loss over the last 3 months [   ] anorexia [  ]; fatigue [  ]; nausea [  ]; night sweats [  ]; fever [  ]; or chills [  ];          Dental: poor dentition[  ]; Last Dentist visit:   Eye : blurred vision [  ]; diplopia [   ]; vision changes [  ];  Amaurosis fugax[  ]; Resp: cough [  ];  wheezing[  ];  hemoptysis[  ]; shortness of breath[  ]; paroxysmal nocturnal dyspnea[  ]; dyspnea on exertion[  ]; or orthopnea[  ];  GI:  gallstones[  ], vomiting[  ];  dysphagia[  ]; melena[  ];  hematochezia [  ]; heartburn[  ];   Hx of  Colonoscopy[ 10 years ago ]; GU: kidney stones [  ]; hematuria[  ];  dysuria [  ];  nocturia[  ];  history of     obstruction [  ]; urinary frequency [  ]             Skin: rash, swelling[  ];, hair loss[  ];  peripheral edema[  ];  or itching[  ]; Musculosketetal: myalgias[  ];  joint swelling[  ];  joint erythema[  ];  joint pain[  ];  back pain[  ];  Heme/Lymph: bruising[  ];  bleeding[  ];  anemia[  ];  Neuro: TIA[  ];  headaches[  ];  stroke[  ];  vertigo[  ];  seizures[  ];   paresthesias[  ];  difficulty walking[  ];  Psych:depression[  ]; anxiety[  ];  Endocrine: diabetes[  ];  thyroid dysfunction[  ];  Immunizations: Flu up to date [ y ]; Pneumococcal up to date [ n ];  Other:  Physical Exam: BP 140/86   Pulse (!) 50   Resp 20   Ht 5\' 10"  (1.778 m)   Wt 227 lb (103 kg)   SpO2 95% Comment: RA  BMI 32.57 kg/m   PHYSICAL EXAMINATION: General appearance: alert, cooperative and appears older than stated age Head: Normocephalic, without obvious abnormality, atraumatic Neck: no adenopathy, no carotid bruit, no JVD, supple, symmetrical,  trachea midline and thyroid not enlarged, symmetric, no tenderness/mass/nodules Lymph nodes: Cervical, supraclavicular, and axillary nodes normal. Resp: clear to auscultation bilaterally Back: symmetric, no curvature. ROM normal. No CVA tenderness. Cardio: regular rate and rhythm, S1, S2 normal, no murmur, click, rub or gallop GI: soft, non-tender; bowel sounds normal; no masses,  no organomegaly and Moderately obesity is present Extremities: extremities normal, atraumatic, no cyanosis or edema Neurologic: Grossly normal Patient has palpable DP and PT pulses bilaterally, popliteal pulses are enlarged on palpation  Diagnostic Studies & Laboratory data:     Recent Radiology Findings:  [place] Lower Ext Art Bilat  Result Date: 03/20/2018 CLINICAL DATA:  72 year old male with a history of palpable abnormality of the popliteal pulses. Cardiovascular risk factors include hypertension, family history, tobacco use, known vascular disease EXAM: NONINVASIVE PHYSIOLOGIC VASCULAR STUDY OF BILATERAL LOWER EXTREMITIES TECHNIQUE: Evaluation of both lower extremities was performed at rest, including directed duplex COMPARISON:  None. FINDINGS: Right Lower Extremity: Directed duplex of the right lower extremity demonstrates atherosclerotic changes of the common femoral artery with triphasic waveform. Triphasic profunda femoris.  Triphasic SFA throughout its length. Popliteal artery measures 2.9 cm by duplex ultrasound transversely, 2.4 cm on longitudinal images. Evidence of atherosclerotic plaque and/or thrombus with a triphasic waveform maintained. Triphasic posterior tibial artery, peroneal artery, and anterior tibial artery. Left Lower Extremity: Directed duplex of the left lower extremity demonstrates atherosclerotic changes of the common femoral artery with triphasic waveform. Triphasic profunda femoris. Triphasic SFA throughout the length. Triphasic popliteal artery. Popliteal diameter measures at least 2.3 cm.  Triphasic waveform of left tibial arteries. IMPRESSION: Directed duplex demonstrates bilateral popliteal artery aneurysm, on the right measuring 2.9 cm, on the left at least 2.3 cm. Tibial arteries are patent bilaterally, with maintained waveforms. Signed, Dulcy Fanny. Dellia Nims, RPVI [facility] Radiology Electronically Signed   By: Corrie Mckusick D.O.   On: 03/20/2018 14:27   Ct Angio Chest Aorta W/cm &/or Wo/cm  Result Date: 03/14/2018 CLINICAL DATA:  Follow-up thoracic aortic aneurysm. EXAM: CT ANGIOGRAPHY CHEST WITH CONTRAST TECHNIQUE: Multidetector CT imaging of the chest was performed using the standard protocol during bolus administration of intravenous  contrast. Multiplanar CT image reconstructions and MIPs were obtained to evaluate the vascular anatomy. CONTRAST:  53mL ISOVUE-370 IOPAMIDOL (ISOVUE-370) INJECTION 76% COMPARISON:  CTA chest dated 08/31/2016 and CTA chest dated 07/15/2015. FINDINGS: Cardiovascular: Stable mild dilatation of the aortic root, measuring 4 cm diameter. Ascending thoracic aorta and aortic arch are normal in caliber. Heart size is normal. No pericardial effusion. Scattered coronary artery calcifications. Mediastinum/Nodes: No mass or enlarged lymph nodes within the mediastinum or perihilar regions. Esophagus is unremarkable. Trachea and central bronchi are unremarkable. Lungs/Pleura: Lungs are clear. Upper Abdomen: Limited images of the upper abdomen are unremarkable. Hepatic cysts again noted. Musculoskeletal: Degenerative changes within the lower thoracic spine, mild to moderate in degree. No acute or suspicious osseous finding. Review of the MIP images confirms the above findings. IMPRESSION: 1. Stable mild dilatation of the aortic root, measuring 4 cm diameter. Recommend semi-annual imaging followup by CTA or MRA and referral to cardiothoracic surgery if not already obtained. This recommendation follows 2010  ACCF/AHA/AATS/ACR/ASA/SCA/SCAI/SIR/STS/SVM Guidelines for the Diagnosis and Management of Patients With Thoracic Aortic Disease. Circulation. 2010; 121: e266-e36 2. No acute findings. Aortic aneurysm NOS (ICD10-I71.9). Electronically Signed   By: Franki Cabot M.D.   On: 03/14/2018 10:29   D Ct Angio Chest Aorta W &/or Wo Contrast  Result Date: 08/31/2016 CLINICAL DATA:  Thoracic aortic aneurysm without rupture. EXAM: CT ANGIOGRAPHY CHEST WITH CONTRAST TECHNIQUE: Multidetector CT imaging of the chest was performed using the standard protocol during bolus administration of intravenous contrast. Multiplanar CT image reconstructions and MIPs were obtained to evaluate the vascular anatomy. Creatinine was obtained on site at [facility] at [address] Results: Creatinine 0.9 mg/dL. CONTRAST:  75 mL Isovue 370 COMPARISON:  07/15/2015 FINDINGS: Cardiovascular: Mild dilatation of the aortic root measuring up to 4.3 cm. This is unchanged from the previous examination. Normal caliber of the mid ascending thoracic aorta measuring roughly 3.1 cm. Proximal descending thoracic aorta measures 3.0 cm and stable. Mid descending thoracic aorta measures 2.9 cm. The descending thoracic aorta is tortuous. There is no evidence for an aortic dissection. Main pulmonary arteries are patent and there is no evidence to suggest a pulmonary embolism. Atherosclerotic calcifications involving the right coronary artery and the LAD. Great vessels are patent. Mediastinum/Nodes: Mild dilatation of the mid esophagus is similar to the previous examination. There is no chest lymphadenopathy. No axillary lymphadenopathy. No significant pericardial fluid. Lungs/Pleura: Trachea and mainstem bronchi are patent. Mild volume loss at the lung bases. There is no evidence for airspace disease or lung consolidation. No significant pleural fluid. Upper Abdomen: Again noted are numerous hypodense lesions in the liver. Findings compatible with  multiple hepatic cysts. Mild thickening of the left adrenal gland could represent hyperplasia. Musculoskeletal: Disc space narrowing and degenerative changes in the lower cervical spine. Bridging osteophytes in lower thoracic spine. Review of the MIP images confirms the above findings. IMPRESSION: Stable dilatation of the aortic root, measuring up to 4.3 cm. Coronary artery calcifications. No acute chest abnormality. Multiple hepatic cysts. Electronically Signed   By: Markus Daft M.D.   On: 08/31/2016 14:26    Ct Angio Chest Aorta W/cm &/or Wo/cm  07/15/2015  CLINICAL DATA:  Evaluate aortic root dilatation pre EXAM: CT ANGIOGRAPHY CHEST WITH CONTRAST TECHNIQUE: Multidetector CT imaging of the chest was performed using the standard protocol during bolus administration of intravenous contrast. Multiplanar CT image reconstructions and MIPs were obtained to evaluate the vascular anatomy. CONTRAST:  119mL OMNIPAQUE IOHEXOL 350 MG/ML SOLN COMPARISON:  08/02/2010 FINDINGS: Lungs are clear.  Airways are normal. Heart size is normal. There is calcified plaque over the left anterior descending and right coronary arteries. Minimal calcified plaque over the thoracic aorta. The aortic root measures 3.9 cm on the coronal reformatted images and 4.2 cm AP diameter on the axial images which is borderline/mildly dilated. Aorta at the sinotubular junction measures 3.1 cm compatible with minimal ectasia. Mid ascending thoracic aorta measures 3 cm which is normal. The aortic arch and descending thoracic aorta are within normal in caliber. There is no evidence of dissection. No evidence of hilar, mediastinal or axillary adenopathy. Remaining mediastinal structures are within normal. Images through the upper abdomen demonstrate multiple well-defined hypodensities within the liver compatible with cysts. There are minimal degenerative changes of the spine. Review of the MIP images confirms the above findings. IMPRESSION: No acute  cardiopulmonary disease. Borderline dilatation of the aortic root measuring 3.9-4.2 cm. Ascending thoracic aorta, aortic arch as well as descending thoracic aorta are within normal in caliber. Recommend semi-annual imaging followup by CTA or MRA and referral to cardiothoracic surgery if not already obtained. This recommendation follows 2010 ACCF/AHA/AATS/ACR/ASA/SCA/SCAI/SIR/STS/SVM Guidelines for the Diagnosis and Management of Patients With Thoracic Aortic Disease. Circulation. 2010; 121: e266-e36. Atherosclerotic coronary artery disease. Multiple liver cysts. Electronically Signed   By: Marin Olp M.D.   On: 07/15/2015 17:26     I have independently reviewed the above radiology studies  and reviewed the findings with the patient.   Recent Lab Findings: Lab Results  Component Value Date   WBC 6.0 07/27/2017   HGB 15.4 07/27/2017   HCT 45.0 07/27/2017   PLT 213.0 07/27/2017   GLUCOSE 107 (H) 07/27/2017   CHOL 189 07/27/2017   TRIG 151.0 (H) 07/27/2017   HDL 39.60 07/27/2017   LDLCALC 119 (H) 07/27/2017   ALT 25 07/27/2017   AST 54 (H) 07/27/2017   NA 141 07/27/2017   K 4.1 07/27/2017   CL 104 07/27/2017   CREATININE 0.97 07/27/2017   BUN 21 07/27/2017   CO2 30 07/27/2017   TSH 1.63 07/27/2017   HGBA1C 5.5 04/14/2016   Echocardiogram 08/28/2016 Echocardiography  Patient:    Phil, Michels MR #:       [id number] Study Date: 08/28/2016 Gender:     M Age:        41 Height:     177.8 cm Weight:     101.6 kg BSA:        2.27 m^2 Pt. Status: Room:   ORDERING    Lanelle Bal MD  REFERRING   Lanelle Bal MD  ATTENDING   Candee Furbish, M.D.  PERFORMING  [facility], Outpatient  cc:  ------------------------------------------------------------------- LV EF: 55% -   60%  ------------------------------------------------------------------- Indications:      Ascending aortic aneurysm (I71.2).  ------------------------------------------------------------------- Study  Conclusions  - Left ventricle: The cavity size was normal. Wall thickness was   increased in a pattern of moderate LVH. Systolic function was   normal. The estimated ejection fraction was in the range of 55%   to 60%. There is hypokinesis of the anteroseptal and apical   myocardium. Doppler parameters are consistent with abnormal left   ventricular relaxation (grade 1 diastolic dysfunction). - Aortic valve: There was mild regurgitation. - Aorta: Aortic root dimension: 40 mm (ED), prior echocardiogram   59mm. - Ascending aorta: The ascending aorta was mildly dilated. - Mitral valve: There was trivial regurgitation. - Pulmonary arteries: Systolic pressure was mildly increased. PA  peak pressure: 33 mm Hg (S).  Impressions:  - Compared to the prior study, there has been no significant   interval change.  ------------------------------------------------------------------- Study data:  Comparison was made to the study of 06/10/2015.  Study status:  Routine.  Procedure:  The patient reported no pain pre or post test. Transthoracic echocardiography. Image quality was adequate.          Echocardiography.  M-mode, complete 2D, spectral Doppler, and color Doppler.  Birthdate:  Patient birthdate: 1946-01-07.  Age:  Patient is 72 yr old.  Sex:  Gender: male. BMI: 32.1 kg/m^2.  Blood pressure:     152/71  Patient status: Outpatient.  Study date:  Study date: 08/28/2016. Study time: 09:54 AM.  Location:  [facility]  -------------------------------------------------------------------  ------------------------------------------------------------------- Left ventricle:  The cavity size was normal. Wall thickness was increased in a pattern of moderate LVH. Systolic function was normal. The estimated ejection fraction was in the range of 55% to 60%.  Regional wall motion abnormalities:   There is hypokinesis of the anteroseptal and apical myocardium. Doppler parameters  are consistent with abnormal left ventricular relaxation (grade 1 diastolic dysfunction).  ------------------------------------------------------------------- Aortic valve:   Trileaflet; normal thickness leaflets. Mobility was not restricted.  Doppler:  Transvalvular velocity was within the normal range. There was no stenosis. There was mild regurgitation.   ------------------------------------------------------------------- Aorta:  Ascending aorta: The ascending aorta was mildly dilated.  ------------------------------------------------------------------- Mitral valve:   Structurally normal valve.   Mobility was not restricted.  Doppler:  Transvalvular velocity was within the normal range. There was no evidence for stenosis. There was trivial regurgitation.  ------------------------------------------------------------------- Left atrium:  The atrium was normal in size.  ------------------------------------------------------------------- Right ventricle:  The cavity size was normal. Wall thickness was normal. Systolic function was normal.  ------------------------------------------------------------------- Pulmonic valve:    Doppler:  Transvalvular velocity was within the normal range. There was no evidence for stenosis.  ------------------------------------------------------------------- Tricuspid valve:   Structurally normal valve.    Doppler: Transvalvular velocity was within the normal range. There was no regurgitation.  ------------------------------------------------------------------- Pulmonary artery:   The main pulmonary artery was normal-sized. Systolic pressure was mildly increased.  ------------------------------------------------------------------- Right atrium:  The atrium was normal in size.  ------------------------------------------------------------------- Pericardium:  There was no pericardial  effusion.  ------------------------------------------------------------------- Systemic veins: Inferior vena cava: The vessel was normal in size. The respirophasic diameter changes were in the normal range (= 50%), consistent with normal central venous pressure. Diameter: 18 mm.  ------------------------------------------------------------------- Measurements   IVC                                         Value        Reference  ID                                          18    mm     ---------    Left ventricle                              Value        Reference  LV ID, ED, PLAX chordal             (L)     40.6  mm     43 - 52  LV ID, ES, PLAX chordal                     28.1  mm     23 - 38  LV fx shortening, PLAX chordal              31    %      >=29  LV PW thickness, ED                         14.7  mm     ---------  IVS/LV PW ratio, ED                         1            <=1.3  Stroke volume, 2D                           98    ml     ---------  Stroke volume/bsa, 2D                       43    ml/m^2 ---------  LV e&', lateral                              9.76  cm/s   ---------  LV E/e&', lateral                            5.77         ---------  LV e&', medial                               6.91  cm/s   ---------  LV E/e&', medial                             8.15         ---------  LV e&', average                              8.34  cm/s   ---------  LV E/e&', average                            6.75         ---------    Ventricular septum                          Value        Reference  IVS thickness, ED                           14.7  mm     ---------    LVOT                                        Value        Reference  LVOT ID, S  24    mm     ---------  LVOT area                                   4.52  cm^2   ---------  LVOT peak velocity, S                       82    cm/s   ---------  LVOT mean velocity, S                       59.9   cm/s   ---------  LVOT VTI, S                                 21.7  cm     ---------    Aortic valve                                Value        Reference  Aortic regurg pressure half-time            1620  ms     ---------    Aorta                                       Value        Reference  Aortic root ID, ED                          40    mm     ---------    Left atrium                                 Value        Reference  LA ID, A-P, ES                              37    mm     ---------  LA ID/bsa, A-P                              1.63  cm/m^2 <=2.2  LA volume, S                                45    ml     ---------  LA volume/bsa, S                            19.8  ml/m^2 ---------  LA volume, ES, 1-p A4C                      33    ml     ---------  LA volume/bsa, ES, 1-p A4C                  14.5  ml/m^2 ---------  LA volume,  ES, 1-p A2C                      54    ml     ---------  LA volume/bsa, ES, 1-p A2C                  23.8  ml/m^2 ---------    Mitral valve                                Value        Reference  Mitral E-wave peak velocity                 56.3  cm/s   ---------  Mitral A-wave peak velocity                 74    cm/s   ---------  Mitral deceleration time                    215   ms     150 - 230  Mitral E/A ratio, peak                      0.8          ---------    Pulmonary arteries                          Value        Reference  PA pressure, S, DP                  (H)     33    mm Hg  <=30    Tricuspid valve                             Value        Reference  Tricuspid regurg peak velocity              276   cm/s   ---------  Tricuspid peak RV-RA gradient               30    mm Hg  ---------  Tricuspid maximal regurg velocity,          276   cm/s   ---------  PISA    Systemic veins                              Value        Reference  Estimated CVP                               3     mm Hg  ---------    Right ventricle                              Value        Reference  RV pressure, S, DP                  (H)     33    mm Hg  <=30  RV s&', lateral, S  9.65  cm/s   ---------  Legend: (L)  and  (H)  mark values outside specified reference range.  ------------------------------------------------------------------- Prepared and Electronically Authenticated by  Candee Furbish, M.D. 2018-02-05T11:43:55  Aortic Size Index=     4.3    /Body surface area is 2.26 meters squared. = 1.9  < 2.75 cm/m2      4% risk per year 2.75 to 4.25          8% risk per year > 4.25 cm/m2    20% risk per year  cross sectional area of aorta cm2/height in meters > 10 consider  surgery     Assessment / Plan: 1/Stable mild dilatation of the aortic root 4 cm with no change over the past 18 months-we will repeat CTA of the chest in 18 months 2/bilateral popliteal artery aneurysm, on the right measuring 2.9 cm, on the left at least 2.3 cm. - followed by Dr Donnetta Hutching    3/Status post ablation by Dr. Anastasio Auerbach notes that currently he has no cardiology follow-up.  No longer wishes to have cardiology follow-up in    [place] and would like to be seen in the [facility]-we will make the referral     Patient was warned about not using Cipro and similar antibiotics. Recent studies have raised concern that fluoroquinolone antibiotics could be associated with an increased risk of aortic aneurysm Fluoroquinolones have non-antimicrobial properties that might jeopardise the integrity of the extracellular matrix of the vascular wall In a  propensity score matched cohort study in [place], there was a 66% increased rate of aortic aneurysm or dissection associated with oral fluoroquinolone use, compared with amoxicillin use, within a 60 day risk period from start of treatment  According to the 2010 ACC/AHA guidelines, we recommend patients with thoracic aortic disease to maintain a LDL of less than 70 and  a HDL of greater than 50. We recommend their blood pressure to remain less than 135/85.    Grace Isaac MD      [address] [place] [address] Office [phone]   Beeper [phone]  04/11/2018 12:57 PM

## 2018-04-11 NOTE — Patient Instructions (Signed)

## 2018-04-12 ENCOUNTER — Ambulatory Visit: Payer: Self-pay | Admitting: *Deleted

## 2018-04-12 ENCOUNTER — Encounter: Payer: Self-pay | Admitting: Adult Health

## 2018-04-12 ENCOUNTER — Telehealth: Payer: Self-pay

## 2018-04-12 ENCOUNTER — Ambulatory Visit (INDEPENDENT_AMBULATORY_CARE_PROVIDER_SITE_OTHER): Payer: Medicare Other

## 2018-04-12 ENCOUNTER — Ambulatory Visit (INDEPENDENT_AMBULATORY_CARE_PROVIDER_SITE_OTHER): Payer: Medicare Other | Admitting: Adult Health

## 2018-04-12 ENCOUNTER — Telehealth: Payer: Self-pay | Admitting: Adult Health

## 2018-04-12 VITALS — BP 146/82 | HR 54 | Temp 98.1°F | Wt 228.0 lb

## 2018-04-12 DIAGNOSIS — R1031 Right lower quadrant pain: Secondary | ICD-10-CM

## 2018-04-12 LAB — POCT URINALYSIS DIPSTICK
Bilirubin, UA: NEGATIVE
Glucose, UA: NEGATIVE
Ketones, UA: NEGATIVE
Leukocytes, UA: NEGATIVE
Nitrite, UA: NEGATIVE
PH UA: 6 (ref 5.0–8.0)
PROTEIN UA: POSITIVE — AB
UROBILINOGEN UA: 0.2 U/dL

## 2018-04-12 NOTE — Telephone Encounter (Addendum)
Pt called with complaints of right abdominal pain that started 04/11/18; he said that he also had diarrhea on 04/10/18; the says that the "pain was vicious before it went away" and it happened 6 hours later (last  episode the evening of 04/11/18); he said he did have nausea also;the pt says that he did have [place] food on 04/10/18; he also reports no symptoms today; recommendations made per nurse triage protocol to include seeing a physician within 24 hours; pt offered and accepted appointment with Dorothyann Peng, [facility] 04/12/18 at 1115; he verbalizes understanding; will route to office for notification of this upcoming appointment.  Reason for Disposition . Age > 60 years  Answer Assessment - Initial Assessment Questions 1. LOCATION: "Where does it hurt?"      Lower right abdomen 2. RADIATION: "Does the pain shoot anywhere else?" (e.g., chest, back)     no 3. ONSET: "When did the pain begin?" (Minutes, hours or days ago)      04/11/18 4. SUDDEN: "Gradual or sudden onset?"     sudden 5. PATTERN "Does the pain come and go, or is it constant?"    - If constant: "Is it getting better, staying the same, or worsening?"      (Note: Constant means the pain never goes away completely; most serious pain is constant and it progresses)     - If intermittent: "How long does it last?" "Do you have pain now?"     (Note: Intermittent means the pain goes away completely between bouts)     intermittent 6. SEVERITY: "How bad is the pain?"  (e.g., Scale 1-10; mild, moderate, or severe)    - MILD (1-3): doesn't interfere with normal activities, abdomen soft and not tender to touch     - MODERATE (4-7): interferes with normal activities or awakens from sleep, tender to touch     - SEVERE (8-10): excruciating pain, doubled over, unable to do any normal activities       severe 7. RECURRENT SYMPTOM: "Have you ever had this type of abdominal pain before?" If so, ask: "When was the last time?" and "What happened  that time?"      no 8. CAUSE: "What do you think is causing the abdominal pain?"     Not sure 9. RELIEVING/AGGRAVATING FACTORS: "What makes it better or worse?" (e.g., movement, antacids, bowel movement)     no 10. OTHER SYMPTOMS: "Has there been any vomiting, diarrhea, constipation, or urine problems?"       Diarrhea, nausea  Protocols used: ABDOMINAL PAIN - MALE-A-AH

## 2018-04-12 NOTE — Telephone Encounter (Signed)
Copied from [place] [id number]. Topic: Quick Communication - See Telephone Encounter >> Apr 12, 2018  3:18 PM Bea Graff, NT wrote: CRM for notification. See Telephone encounter for: 04/12/18. Patient calling and states that he is in a lot of pain and wants to know if he should go to the ER. Saw [facility] this morning and was told possible kidney stone. Spoke with Rachel Bo at the practice and stated [name] will advise once available. Pt aware and awaiting call back.

## 2018-04-12 NOTE — Progress Notes (Signed)
Subjective:    Patient ID: Daniel Guerrero, male    DOB: 1945/09/27, 72 y.o.   MRN: [id number]  No longer has an appendix.    Abdominal Pain  This is a new problem. The current episode started yesterday. The onset quality is sudden. The problem occurs daily. The problem has been waxing and waning. Pain location: right middle  The pain is at a severity of 8/10. The quality of the pain is dull and sharp. The abdominal pain does not radiate. Associated symptoms include diarrhea (started prior to symptoms ) and vomiting (yesterday ). Pertinent negatives include no anorexia, arthralgias, belching, constipation, dysuria, fever, frequency, hematuria, melena or nausea. Nothing aggravates the pain. The pain is relieved by nothing. He has tried nothing for the symptoms. kidney stones     Review of Systems  Constitutional: Negative for fever.  Gastrointestinal: Positive for abdominal pain, diarrhea (started prior to symptoms ) and vomiting (yesterday ). Negative for anorexia, constipation, melena and nausea.  Genitourinary: Negative for dysuria, frequency and hematuria.  Musculoskeletal: Negative for arthralgias.   Past Medical History:  Diagnosis Date  . Allergy   . Cataract of both eyes    implants both eyes   . Eczema   . Enlarged aorta ([place])   . Erectile dysfunction   . Hearing loss    high frequency  . History of inguinal hernia repair, bilateral   . Hypertension   . Kidney stones   . Loss of hearing    high frequency  . Meniere disease   . Obesity   . Paroxysmal SVT (supraventricular tachycardia) ([place])   . Psoriasis   . S/P appendectomy   . S/P tonsillectomy   . Seasonal allergies   . Type II or unspecified type diabetes mellitus without mention of complication, uncontrolled    12-29-14 Told he was prediabetic once    Social History   Socioeconomic History  . Marital status: Married    Spouse name: Not on file  . Number of children: Not on file  . Years of education: Not  on file  . Highest education level: Not on file  Occupational History  . Not on file  Social Needs  . Financial resource strain: Not on file  . Food insecurity:    Worry: Not on file    Inability: Not on file  . Transportation needs:    Medical: Not on file    Non-medical: Not on file  Tobacco Use  . Smoking status: Former Smoker    Last attempt to quit: 07/24/1964    Years since quitting: 53.7  . Smokeless tobacco: Never Used  . Tobacco comment: Quit 20-30 years ago; smoked pipe and cigars   Substance and Sexual Activity  . Alcohol use: Yes    Comment: occasional; couple beers/day  . Drug use: No  . Sexual activity: Not on file  Lifestyle  . Physical activity:    Days per week: Not on file    Minutes per session: Not on file  . Stress: Not on file  Relationships  . Social connections:    Talks on phone: Not on file    Gets together: Not on file    Attends religious service: Not on file    Active member of club or organization: Not on file    Attends meetings of clubs or organizations: Not on file    Relationship status: Not on file  . Intimate partner violence:    Fear of current or ex  partner: Not on file    Emotionally abused: Not on file    Physically abused: Not on file    Forced sexual activity: Not on file  Other Topics Concern  . Not on file  Social History Narrative   Daily Caffeine use: 5-6 drinks daily   Exercising 2 times per week   No Drug use          Past Surgical History:  Procedure Laterality Date  . Ablasion    . APPENDECTOMY    . COLONOSCOPY     unsure where or when- states greater than 10 yrs ago per pt- was normal per pt and his wife   . CYSTOSCOPY WITH RETROGRADE PYELOGRAM, URETEROSCOPY AND STENT PLACEMENT Left 12/30/2014   Procedure: CYSTOSCOPY WITH LEFT URETEROSCOPY STONE EXTRACTION WITH STENT;  Surgeon: Franchot Gallo, MD;  Location: [facility];  Service: Urology;  Laterality: Left;  . ELECTROPHYSIOLOGIC STUDY N/A 08/24/2015   Procedure:  SVT Ablation;  Surgeon: Thompson Grayer, MD;  Location: [facility];  Service: Cardiovascular;  Laterality: N/A;  . ENDOVENOUS ABLATION SAPHENOUS VEIN W/ LASER Right 12/04/2016   endovenous laser ablation right greater saphenous vein by Tinnie Gens MD   . ESOPHAGOGASTRODUODENOSCOPY N/A 08/18/2016   Procedure: ESOPHAGOGASTRODUODENOSCOPY (EGD);  Surgeon: [name], MD;  Location: [facility];  Service: Endoscopy;  Laterality: N/A;  . ESOPHAGOGASTRODUODENOSCOPY N/A 09/06/2016   Procedure: ESOPHAGOGASTRODUODENOSCOPY (EGD);  Surgeon: [name], MD;  Location: [facility];  Service: Endoscopy;  Laterality: N/A;  100 - moved to 2/14 @ 2:15 per Tretha Sciara  . EYE SURGERY     Catracts removed both eye 30 yrs ago  . HERNIA REPAIR     right and left  . HOLMIUM LASER APPLICATION Left 9/0/2409   Procedure: HOLMIUM LASER APPLICATION;  Surgeon: Franchot Gallo, MD;  Location: [facility];  Service: Urology;  Laterality: Left;  Marland Kitchen MALONEY DILATION N/A 09/06/2016   Procedure: Venia Minks DILATION;  Surgeon: [name], MD;  Location: [facility];  Service: Endoscopy;  Laterality: N/A;  . TONSILLECTOMY      Family History  Problem Relation Age of Onset  . Cancer Mother        lung  . Thyroid disease Mother   . Diabetes Father   . GI Bleed Father   . Aneurysm Father   . AAA (abdominal aortic aneurysm) Father   . Colon cancer Neg Hx   . Colon polyps Neg Hx   . Rectal cancer Neg Hx   . Stomach cancer Neg Hx     Allergies  Allergen Reactions  . Peanut-Containing Drug Products Hives  . Penicillins Hives    Has patient had a PCN reaction causing immediate rash, facial/tongue/throat swelling, SOB or lightheadedness with hypotension: No Has patient had a PCN reaction causing severe rash involving mucus membranes or skin necrosis: No Has patient had a PCN reaction that required hospitalization No Has patient had a PCN reaction occurring within the last 10 years: No If all of the above answers  are "NO", then may proceed with Cephalosporin use.  . Quinolones     Patient was warned about not using Cipro and similar antibiotics. Recent studies have raised concern that fluoroquinolone antibiotics could be associated with an increased risk of aortic aneurysm Fluoroquinolones have non-antimicrobial properties that might jeopardise the integrity of the extracellular matrix of the vascular wall In a  propensity score matched cohort study in [place], there was a 66% increased rate of aortic aneurysm  or dissection associated with oral fluoroquinolone use, compared wit    Current Outpatient Medications on File Prior to Visit  Medication Sig Dispense Refill  . amLODipine (NORVASC) 5 MG tablet TAKE ONE TABLET BY MOUTH DAILY (DUE FOR PHYSICAL WITH Lakaisha Danish) 90 tablet 1  . aspirin 81 MG chewable tablet Chew 81 mg by mouth daily.    Marland Kitchen b complex vitamins tablet Take 1 tablet by mouth daily.    . Cyanocobalamin (VITAMIN B-12 PO) Take 1 tablet by mouth daily.    . fluticasone (FLONASE) 50 MCG/ACT nasal spray Place 2 sprays into both nostrils daily as needed for allergies or rhinitis.    Marland Kitchen ibuprofen (ADVIL,MOTRIN) 400 MG tablet Take 1 tablet (400 mg total) by mouth every 6 (six) hours as needed. 30 tablet 0  . lansoprazole (PREVACID) 30 MG capsule Take 1 capsule (30 mg total) by mouth daily. 90 capsule 3  . loratadine (CLARITIN) 10 MG tablet Take 10 mg by mouth daily as needed for allergies.    . tamsulosin (FLOMAX) 0.4 MG CAPS capsule Take 1 capsule (0.4 mg total) by mouth daily. 90 capsule 3  . triamcinolone ointment (KENALOG) 0.5 % Apply 1 application topically 2 (two) times daily. (Patient taking differently: Apply 1 application topically 2 (two) times daily as needed. ) 30 g 0  . vitamin C (ASCORBIC ACID) 500 MG tablet Take 500 mg by mouth daily.     No current facility-administered medications on file prior to visit.     BP (!) 146/82 (BP Location: Left Arm, Patient Position: Sitting, Cuff Size:  Large)   Pulse (!) 54   Temp 98.1 F (36.7 C) (Oral)   Wt 228 lb (103.4 kg)   SpO2 96%   BMI 32.71 kg/m       Objective:   Physical Exam  Constitutional: He appears well-developed and well-nourished. No distress.  Pulmonary/Chest: Effort normal and breath sounds normal.  Abdominal: Soft. Bowel sounds are normal. He exhibits no distension and no mass. There is no hepatosplenomegaly, splenomegaly or hepatomegaly. There is no tenderness. There is no rebound, no guarding, no CVA tenderness, no tenderness at McBurney's point and negative Murphy's sign. No hernia.  Skin: Skin is warm and dry. Capillary refill takes less than 2 seconds. He is not diaphoretic.  Psychiatric: He has a normal mood and affect. His behavior is normal. Judgment and thought content normal.  Nursing note and vitals reviewed.     Assessment & Plan:  1. Abdominal pain, RLQ - possible kidney stone - DG Abd 1 View; Future - POC Urinalysis Dipstick - Treatment depends on imaging and UA   Dorothyann Peng, NP

## 2018-04-12 NOTE — Telephone Encounter (Signed)
Spoke with pt advised to go to the ED if pain persists or gets worse. Pt voiced understanding.

## 2018-04-17 ENCOUNTER — Encounter: Payer: Self-pay | Admitting: Adult Health

## 2018-04-17 ENCOUNTER — Ambulatory Visit (INDEPENDENT_AMBULATORY_CARE_PROVIDER_SITE_OTHER): Payer: Medicare Other | Admitting: Adult Health

## 2018-04-17 VITALS — BP 120/64 | HR 61 | Temp 98.0°F | Wt 225.0 lb

## 2018-04-17 DIAGNOSIS — R1031 Right lower quadrant pain: Secondary | ICD-10-CM

## 2018-04-17 DIAGNOSIS — N401 Enlarged prostate with lower urinary tract symptoms: Secondary | ICD-10-CM

## 2018-04-17 DIAGNOSIS — N138 Other obstructive and reflux uropathy: Secondary | ICD-10-CM

## 2018-04-17 MED ORDER — DUTASTERIDE 0.5 MG PO CAPS: 0.5000 mg | ORAL_CAPSULE | Freq: Every day | ORAL | 1 refills | Status: DC

## 2018-04-17 NOTE — Progress Notes (Signed)
Subjective:    Patient ID: Daniel Guerrero, male    DOB: Apr 05, 1946, 72 y.o.   MRN: [id number]  HPI  72 year old male who  has a past medical history of Allergy, Cataract of both eyes, Eczema, Enlarged aorta (HCC), Erectile dysfunction, Hearing loss, History of inguinal hernia repair, bilateral, Hypertension, Kidney stones, Loss of hearing, Meniere disease, Obesity, Paroxysmal SVT (supraventricular tachycardia) ([place]), Psoriasis, S/P appendectomy, S/P tonsillectomy, Seasonal allergies, and Type II or unspecified type diabetes mellitus without mention of complication, uncontrolled.  He presents to the office today for follow up regarding lower abdominal pain. He was seen late last week for right middle abdomen pain. He reports that over the weekend he had some severe pain which caused him to have episodes of vomiting. Over the last two days he has been symptom free and pain has resolved. He is unsure if he passed a stone   He does report that over the last few months he has worsening BPH symptoms. Reports increased nocturia (3-4 times per night), dribbling, decreased stream, and incomplete bladder emptying. He denies any dysuria or hematuria. Is taking Flomax daily    Review of Systems See HPI   Past Medical History:  Diagnosis Date  . Allergy   . Cataract of both eyes    implants both eyes   . Eczema   . Enlarged aorta ([place])   . Erectile dysfunction   . Hearing loss    high frequency  . History of inguinal hernia repair, bilateral   . Hypertension   . Kidney stones   . Loss of hearing    high frequency  . Meniere disease   . Obesity   . Paroxysmal SVT (supraventricular tachycardia) ([place])   . Psoriasis   . S/P appendectomy   . S/P tonsillectomy   . Seasonal allergies   . Type II or unspecified type diabetes mellitus without mention of complication, uncontrolled    12-29-14 Told he was prediabetic once    Social History   Socioeconomic History  . Marital status: Married   Spouse name: Not on file  . Number of children: Not on file  . Years of education: Not on file  . Highest education level: Not on file  Occupational History  . Not on file  Social Needs  . Financial resource strain: Not on file  . Food insecurity:    Worry: Not on file    Inability: Not on file  . Transportation needs:    Medical: Not on file    Non-medical: Not on file  Tobacco Use  . Smoking status: Former Smoker    Last attempt to quit: 07/24/1964    Years since quitting: 53.7  . Smokeless tobacco: Never Used  . Tobacco comment: Quit 20-30 years ago; smoked pipe and cigars   Substance and Sexual Activity  . Alcohol use: Yes    Comment: occasional; couple beers/day  . Drug use: No  . Sexual activity: Not on file  Lifestyle  . Physical activity:    Days per week: Not on file    Minutes per session: Not on file  . Stress: Not on file  Relationships  . Social connections:    Talks on phone: Not on file    Gets together: Not on file    Attends religious service: Not on file    Active member of club or organization: Not on file    Attends meetings of clubs or organizations: Not on file  Relationship status: Not on file  . Intimate partner violence:    Fear of current or ex partner: Not on file    Emotionally abused: Not on file    Physically abused: Not on file    Forced sexual activity: Not on file  Other Topics Concern  . Not on file  Social History Narrative   Daily Caffeine use: 5-6 drinks daily   Exercising 2 times per week   No Drug use          Past Surgical History:  Procedure Laterality Date  . Ablasion    . APPENDECTOMY    . COLONOSCOPY     unsure where or when- states greater than 10 yrs ago per pt- was normal per pt and his wife   . CYSTOSCOPY WITH RETROGRADE PYELOGRAM, URETEROSCOPY AND STENT PLACEMENT Left 12/30/2014   Procedure: CYSTOSCOPY WITH LEFT URETEROSCOPY STONE EXTRACTION WITH STENT;  Surgeon: Franchot Gallo, MD;  Location: [facility];   Service: Urology;  Laterality: Left;  . ELECTROPHYSIOLOGIC STUDY N/A 08/24/2015   Procedure: SVT Ablation;  Surgeon: Thompson Grayer, MD;  Location: [facility];  Service: Cardiovascular;  Laterality: N/A;  . ENDOVENOUS ABLATION SAPHENOUS VEIN W/ LASER Right 12/04/2016   endovenous laser ablation right greater saphenous vein by Tinnie Gens MD   . ESOPHAGOGASTRODUODENOSCOPY N/A 08/18/2016   Procedure: ESOPHAGOGASTRODUODENOSCOPY (EGD);  Surgeon: [name], MD;  [facility];  Service: Endoscopy;  Laterality: N/A;  . ESOPHAGOGASTRODUODENOSCOPY N/A 09/06/2016   Procedure: ESOPHAGOGASTRODUODENOSCOPY (EGD);  Surgeon: [name], MD;  [facility];  Service: Endoscopy;  Laterality: N/A;  100 - moved to 2/14 @ 2:15 per Tretha Sciara  . EYE SURGERY     Catracts removed both eye 30 yrs ago  . HERNIA REPAIR     right and left  . HOLMIUM LASER APPLICATION Left 04/01/2425   Procedure: HOLMIUM LASER APPLICATION;  Surgeon: Franchot Gallo, MD;  Location: [facility];  Service: Urology;  Laterality: Left;  Marland Kitchen MALONEY DILATION N/A 09/06/2016   Procedure: Venia Minks DILATION;  Surgeon: [name], MD;  [facility];  Service: Endoscopy;  Laterality: N/A;  . TONSILLECTOMY      Family History  Problem Relation Age of Onset  . Cancer Mother        lung  . Thyroid disease Mother   . Diabetes Father   . GI Bleed Father   . Aneurysm Father   . AAA (abdominal aortic aneurysm) Father   . Colon cancer Neg Hx   . Colon polyps Neg Hx   . Rectal cancer Neg Hx   . Stomach cancer Neg Hx     Allergies  Allergen Reactions  . Peanut-Containing Drug Products Hives  . Penicillins Hives    Has patient had a PCN reaction causing immediate rash, facial/tongue/throat swelling, SOB or lightheadedness with hypotension: No Has patient had a PCN reaction causing severe rash involving mucus membranes or skin necrosis: No Has patient had a PCN reaction that required hospitalization No Has  patient had a PCN reaction occurring within the last 10 years: No If all of the above answers are "NO", then may proceed with Cephalosporin use.  . Quinolones     Patient was warned about not using Cipro and similar antibiotics. Recent studies have raised concern that fluoroquinolone antibiotics could be associated with an increased risk of aortic aneurysm Fluoroquinolones have non-antimicrobial properties that might jeopardise the integrity of the extracellular matrix of the vascular wall In  a  propensity score matched cohort study in [place], there was a 66% increased rate of aortic aneurysm or dissection associated with oral fluoroquinolone use, compared wit    Current Outpatient Medications on File Prior to Visit  Medication Sig Dispense Refill  . amLODipine (NORVASC) 5 MG tablet TAKE ONE TABLET BY MOUTH DAILY (DUE FOR PHYSICAL WITH Elfie Costanza) 90 tablet 1  . aspirin 81 MG chewable tablet Chew 81 mg by mouth daily.    Marland Kitchen b complex vitamins tablet Take 1 tablet by mouth daily.    . Cyanocobalamin (VITAMIN B-12 PO) Take 1 tablet by mouth daily.    . fluticasone (FLONASE) 50 MCG/ACT nasal spray Place 2 sprays into both nostrils daily as needed for allergies or rhinitis.    Marland Kitchen ibuprofen (ADVIL,MOTRIN) 400 MG tablet Take 1 tablet (400 mg total) by mouth every 6 (six) hours as needed. 30 tablet 0  . lansoprazole (PREVACID) 30 MG capsule Take 1 capsule (30 mg total) by mouth daily. 90 capsule 3  . loratadine (CLARITIN) 10 MG tablet Take 10 mg by mouth daily as needed for allergies.    . tamsulosin (FLOMAX) 0.4 MG CAPS capsule Take 1 capsule (0.4 mg total) by mouth daily. 90 capsule 3  . triamcinolone ointment (KENALOG) 0.5 % Apply 1 application topically 2 (two) times daily. (Patient taking differently: Apply 1 application topically 2 (two) times daily as needed. ) 30 g 0  . vitamin C (ASCORBIC ACID) 500 MG tablet Take 500 mg by mouth daily.     No current facility-administered medications on file prior  to visit.     BP 120/64 (BP Location: Left Arm, Patient Position: Sitting, Cuff Size: Normal)   Pulse 61   Temp 98 F (36.7 C) (Oral)   Wt 225 lb (102.1 kg)   SpO2 96%   BMI 32.28 kg/m       Objective:   Physical Exam  Constitutional: He appears well-developed and well-nourished. No distress.  Cardiovascular: Normal rate, regular rhythm, normal heart sounds and intact distal pulses.  Pulmonary/Chest: Effort normal and breath sounds normal.  Abdominal: Soft. Bowel sounds are normal.  Skin: Skin is dry. He is not diaphoretic.  Psychiatric: He has a normal mood and affect. His behavior is normal. Judgment and thought content normal.  Nursing note and vitals reviewed.     Assessment & Plan:  1. Right lower quadrant abdominal pain - Resolved. I think he may have passed a stone - Follow up if pain returns  - Consider CT of abdomen   2. BPH with urinary obstruction - Will add Avodart  - dutasteride (AVODART) 0.5 MG capsule; Take 1 capsule (0.5 mg total) by mouth daily.  Dispense: 90 capsule; Refill: 1  Dorothyann Peng, NP

## 2018-04-19 ENCOUNTER — Encounter: Payer: Self-pay | Admitting: *Deleted

## 2018-04-19 ENCOUNTER — Other Ambulatory Visit: Payer: Self-pay | Admitting: *Deleted

## 2018-04-19 ENCOUNTER — Ambulatory Visit (INDEPENDENT_AMBULATORY_CARE_PROVIDER_SITE_OTHER): Payer: Medicare Other | Admitting: Internal Medicine

## 2018-04-19 ENCOUNTER — Encounter: Payer: Self-pay | Admitting: Internal Medicine

## 2018-04-19 ENCOUNTER — Other Ambulatory Visit: Payer: Self-pay

## 2018-04-19 VITALS — BP 140/86 | HR 79 | Temp 97.0°F | Ht 70.0 in | Wt 225.2 lb

## 2018-04-19 DIAGNOSIS — K219 Gastro-esophageal reflux disease without esophagitis: Secondary | ICD-10-CM | POA: Diagnosis not present

## 2018-04-19 DIAGNOSIS — Z86010 Personal history of colonic polyps: Secondary | ICD-10-CM

## 2018-04-19 DIAGNOSIS — R1310 Dysphagia, unspecified: Secondary | ICD-10-CM

## 2018-04-19 DIAGNOSIS — R1319 Other dysphagia: Secondary | ICD-10-CM

## 2018-04-19 MED ORDER — NA SULFATE-K SULFATE-MG SULF 17.5-3.13-1.6 GM/177ML PO SOLN: 1.0000 | ORAL | 0 refills | Status: DC

## 2018-04-19 MED ORDER — LANSOPRAZOLE 30 MG PO CPDR: 30.0000 mg | DELAYED_RELEASE_CAPSULE | Freq: Every day | ORAL | 3 refills | Status: DC

## 2018-04-19 NOTE — Patient Instructions (Addendum)
Schedule an EGD with dilation (dysphagia) and surveillance colonoscopy (hx of polyps) - propofol  Continue lansoprazole 30 mg daily (Rx - #90  - one daily - 3 refills)  Further recommendations to follow

## 2018-04-22 NOTE — Progress Notes (Signed)
Primary Care Physician:  Dorothyann Peng, NP Primary Gastroenterologist:  Dr. Gala Romney  Pre-Procedure History & Physical: HPI:  Daniel Guerrero is a 72 y.o. male here to set up a surveillance colonoscopy.  History of colonic adenoma.  Previously underwent colonoscopy in [place].  Wants to have repeat done here.  History of GERD well-controlled on lansoprazole 30 mg daily.  History of Schatzki's ring.  Presented with a food impaction previously.  Ring was dilated to a 56 [place] size with Maloney dilators back in 2018.  He notes that he is starting to have recurrent dysphagia  Past Medical History:  Diagnosis Date  . Allergy   . Cataract of both eyes    implants both eyes   . Eczema   . Enlarged aorta ([place])   . Erectile dysfunction   . Hearing loss    high frequency  . History of inguinal hernia repair, bilateral   . Hypertension   . Kidney stones   . Loss of hearing    high frequency  . Meniere disease   . Obesity   . Paroxysmal SVT (supraventricular tachycardia) ([place])   . Psoriasis   . S/P appendectomy   . S/P tonsillectomy   . Seasonal allergies   . Type II or unspecified type diabetes mellitus without mention of complication, uncontrolled    12-29-14 Told he was prediabetic once    Past Surgical History:  Procedure Laterality Date  . Ablasion    . APPENDECTOMY    . COLONOSCOPY     unsure where or when- states greater than 10 yrs ago per pt- was normal per pt and his wife   . CYSTOSCOPY WITH RETROGRADE PYELOGRAM, URETEROSCOPY AND STENT PLACEMENT Left 12/30/2014   Procedure: CYSTOSCOPY WITH LEFT URETEROSCOPY STONE EXTRACTION WITH STENT;  Surgeon: Franchot Gallo, MD;  [facility];  Service: Urology;  Laterality: Left;  . ELECTROPHYSIOLOGIC STUDY N/A 08/24/2015   Procedure: SVT Ablation;  Surgeon: Thompson Grayer, MD;  Location: [facility];  Service: Cardiovascular;  Laterality: N/A;  . ENDOVENOUS ABLATION SAPHENOUS VEIN W/ LASER Right 12/04/2016   endovenous  laser ablation right greater saphenous vein by Tinnie Gens MD   . ESOPHAGOGASTRODUODENOSCOPY N/A 08/18/2016   Procedure: ESOPHAGOGASTRODUODENOSCOPY (EGD);  Surgeon: [name], MD;  [facility];  Service: Endoscopy;  Laterality: N/A;  . ESOPHAGOGASTRODUODENOSCOPY N/A 09/06/2016   Procedure: ESOPHAGOGASTRODUODENOSCOPY (EGD);  Surgeon: [name], MD;  [facility];  Service: Endoscopy;  Laterality: N/A;  100 - moved to 2/14 @ 2:15 per Tretha Sciara  . EYE SURGERY     Catracts removed both eye 30 yrs ago  . HERNIA REPAIR     right and left  . HOLMIUM LASER APPLICATION Left 08/30/348   Procedure: HOLMIUM LASER APPLICATION;  Surgeon: Franchot Gallo, MD;  [facility];  Service: Urology;  Laterality: Left;  Marland Kitchen MALONEY DILATION N/A 09/06/2016   Procedure: Venia Minks DILATION;  Surgeon: [name], MD;  [facility];  Service: Endoscopy;  Laterality: N/A;  . TONSILLECTOMY      Prior to Admission medications   Medication Sig Start Date End Date Taking? Authorizing Provider  amLODipine (NORVASC) 5 MG tablet TAKE ONE TABLET BY MOUTH DAILY (DUE FOR PHYSICAL WITH CORY) 02/27/18  Yes Nafziger, [name], NP  b complex vitamins tablet Take 1 tablet by mouth daily.   Yes [provider]  Cyanocobalamin (VITAMIN B-12 PO) Take 1 tablet by mouth daily.   Yes [provider]  dutasteride (AVODART) 0.5 MG capsule Take 1 capsule (0.5 mg total) by mouth daily. 04/17/18 07/16/18 Yes Nafziger, [name], NP  fluticasone (FLONASE) 50 MCG/ACT nasal spray Place 2 sprays into both nostrils daily as needed for allergies or rhinitis.   Yes [provider]  ibuprofen (ADVIL,MOTRIN) 400 MG tablet Take 1 tablet (400 mg total) by mouth every 6 (six) hours as needed. 12/18/16  Yes [name], MD  lansoprazole (PREVACID) 30 MG capsule Take 1 capsule (30 mg total) by mouth daily. 03/20/17  Yes Vadie Principato, Cristopher Estimable, MD  loratadine (CLARITIN) 10 MG tablet Take 10 mg by mouth daily as  needed for allergies.   Yes [provider]  tamsulosin (FLOMAX) 0.4 MG CAPS capsule Take 1 capsule (0.4 mg total) by mouth daily. 11/21/17  Yes Nafziger, [name], NP  triamcinolone ointment (KENALOG) 0.5 % Apply 1 application topically 2 (two) times daily. Patient taking differently: Apply 1 application topically 2 (two) times daily as needed.  04/14/16  Yes Nafziger, [name], NP  vitamin C (ASCORBIC ACID) 500 MG tablet Take 500 mg by mouth daily.   Yes [provider]  aspirin 81 MG chewable tablet Chew 81 mg by mouth daily.    [provider]  lansoprazole (PREVACID) 30 MG capsule Take 1 capsule (30 mg total) by mouth daily at 12 noon. 04/19/18   Latorsha Curling, Cristopher Estimable, MD  Na Sulfate-K Sulfate-Mg Sulf 17.5-3.13-1.6 GM/177ML SOLN Take 1 kit by mouth as directed. 04/19/18   [name], MD    Allergies as of 04/19/2018 - Review Complete 04/19/2018  Allergen Reaction Noted  . Peanut-containing drug products Hives 12/31/2009  . Penicillins Hives 03/11/2008  . Quinolones  04/11/2018    Family History  Problem Relation Age of Onset  . Cancer Mother        lung  . Thyroid disease Mother   . Diabetes Father   . GI Bleed Father   . Aneurysm Father   . AAA (abdominal aortic aneurysm) Father   . Colon cancer Neg Hx   . Colon polyps Neg Hx   . Rectal cancer Neg Hx   . Stomach cancer Neg Hx     Social History   Socioeconomic History  . Marital status: Married    Spouse name: Not on file  . Number of children: Not on file  . Years of education: Not on file  . Highest education level: Not on file  Occupational History  . Not on file  Social Needs  . Financial resource strain: Not on file  . Food insecurity:    Worry: Not on file    Inability: Not on file  . Transportation needs:    Medical: Not on file    Non-medical: Not on file  Tobacco Use  . Smoking status: Former Smoker    Last attempt to quit: 07/24/1964    Years since quitting: 53.7  . Smokeless tobacco:  Never Used  . Tobacco comment: Quit 20-30 years ago; smoked pipe and cigars   Substance and Sexual Activity  . Alcohol use: Yes    Comment: couple beers/day  . Drug use: No  . Sexual activity: Not on file  Lifestyle  . Physical activity:    Days per week: Not on file    Minutes per session: Not on file  . Stress: Not on file  Relationships  . Social connections:    Talks on phone: Not on file    Gets together: Not on file  Attends religious service: Not on file    Active member of club or organization: Not on file    Attends meetings of clubs or organizations: Not on file    Relationship status: Not on file  . Intimate partner violence:    Fear of current or ex partner: Not on file    Emotionally abused: Not on file    Physically abused: Not on file    Forced sexual activity: Not on file  Other Topics Concern  . Not on file  Social History Narrative   Daily Caffeine use: 5-6 drinks daily   Exercising 2 times per week   No Drug use          Review of Systems: See HPI, otherwise negative ROS  Physical Exam: BP 140/86   Pulse 79   Temp (!) 97 F (36.1 C) (Oral)   Ht '5\' 10"'$  (1.778 m)   Wt 225 lb 3.2 oz (102.2 kg)   BMI 32.31 kg/m  General:   Alert,  Well-developed, well-nourished, pleasant and cooperative in NAD Neck:  Supple; no masses or thyromegaly. No significant cervical adenopathy. Lungs:  Clear throughout to auscultation.   No wheezes, crackles, or rhonchi. No acute distress. Heart:  Regular rate and rhythm; no murmurs, clicks, rubs,  or gallops. Abdomen: Non-distended, normal bowel sounds.  Soft and nontender without appreciable mass or hepatosplenomegaly.  Pulses:  Normal pulses noted. Extremities:  Without clubbing or edema.  Impression/Plan: Very pleasant 72 year old gentleman with history of colonic adenoma; due for surveillance colonoscopy this time.  History of GERD well-controlled on lansoprazole.  History of a Schatzki's ring now with vague  recurrent esophageal dysphagia.  Recommendations: I have offered the patient both a surveillance colonoscopy and an EGD with esophageal dilation in the near future.  The risks, benefits, limitations, imponderables and alternatives regarding both EGD and colonoscopy have been reviewed with the patient. Questions have been answered. All parties agreeable.   We will continue lansoprazole 30 mg daily.   Notice: This dictation was prepared with Dragon dictation along with smaller phrase technology. Any transcriptional errors that result from this process are unintentional and may not be corrected upon review.

## 2018-04-23 ENCOUNTER — Telehealth: Payer: Self-pay | Admitting: *Deleted

## 2018-04-23 NOTE — Telephone Encounter (Signed)
Pre-op scheduled for 05-30-18 at 9:00am. Patient aware. Letter mailed.

## 2018-04-27 ENCOUNTER — Encounter (HOSPITAL_COMMUNITY): Payer: Self-pay | Admitting: Emergency Medicine

## 2018-04-27 ENCOUNTER — Emergency Department (HOSPITAL_COMMUNITY)
Admission: EM | Admit: 2018-04-27 | Discharge: 2018-04-27 | Disposition: A | Discharge status: Home / Self Care | Payer: Medicare Other | Attending: Emergency Medicine | Admitting: Emergency Medicine

## 2018-04-27 ENCOUNTER — Emergency Department (HOSPITAL_COMMUNITY): Payer: Medicare Other

## 2018-04-27 ENCOUNTER — Other Ambulatory Visit: Payer: Self-pay

## 2018-04-27 DIAGNOSIS — K828 Other specified diseases of gallbladder: Secondary | ICD-10-CM | POA: Diagnosis not present

## 2018-04-27 DIAGNOSIS — R1013 Epigastric pain: Secondary | ICD-10-CM | POA: Diagnosis not present

## 2018-04-27 DIAGNOSIS — R079 Chest pain, unspecified: Secondary | ICD-10-CM | POA: Diagnosis not present

## 2018-04-27 DIAGNOSIS — Z79899 Other long term (current) drug therapy: Secondary | ICD-10-CM | POA: Diagnosis not present

## 2018-04-27 DIAGNOSIS — R0789 Other chest pain: Secondary | ICD-10-CM | POA: Diagnosis not present

## 2018-04-27 DIAGNOSIS — Z87891 Personal history of nicotine dependence: Secondary | ICD-10-CM | POA: Insufficient documentation

## 2018-04-27 DIAGNOSIS — Z7982 Long term (current) use of aspirin: Secondary | ICD-10-CM | POA: Diagnosis not present

## 2018-04-27 DIAGNOSIS — R1011 Right upper quadrant pain: Secondary | ICD-10-CM | POA: Diagnosis not present

## 2018-04-27 DIAGNOSIS — R002 Palpitations: Secondary | ICD-10-CM | POA: Diagnosis not present

## 2018-04-27 DIAGNOSIS — E119 Type 2 diabetes mellitus without complications: Secondary | ICD-10-CM | POA: Diagnosis not present

## 2018-04-27 DIAGNOSIS — I10 Essential (primary) hypertension: Secondary | ICD-10-CM | POA: Insufficient documentation

## 2018-04-27 DIAGNOSIS — K449 Diaphragmatic hernia without obstruction or gangrene: Secondary | ICD-10-CM | POA: Diagnosis not present

## 2018-04-27 LAB — HEPATIC FUNCTION PANEL
ALBUMIN: 4.4 g/dL (ref 3.5–5.0)
ALT: 27 U/L (ref 0–44)
AST: 59 U/L — AB (ref 15–41)
Alkaline Phosphatase: 93 U/L (ref 38–126)
Bilirubin, Direct: 0.1 mg/dL (ref 0.0–0.2)
Indirect Bilirubin: 0.7 mg/dL (ref 0.3–0.9)
Total Bilirubin: 0.8 mg/dL (ref 0.3–1.2)
Total Protein: 7.2 g/dL (ref 6.5–8.1)

## 2018-04-27 LAB — BASIC METABOLIC PANEL
ANION GAP: 6 (ref 5–15)
BUN: 21 mg/dL (ref 8–23)
CALCIUM: 8.9 mg/dL (ref 8.9–10.3)
CO2: 25 mmol/L (ref 22–32)
Chloride: 109 mmol/L (ref 98–111)
Creatinine, Ser: 0.88 mg/dL (ref 0.61–1.24)
GFR calc Af Amer: >60 mL/min (ref >60)
GLUCOSE: 135 mg/dL — AB (ref 70–99)
POTASSIUM: 3.8 mmol/L (ref 3.5–5.1)
SODIUM: 140 mmol/L (ref 135–145)

## 2018-04-27 LAB — LIPASE, BLOOD: LIPASE: 30 U/L (ref 11–51)

## 2018-04-27 LAB — CBC
HEMATOCRIT: 45.5 % (ref 39.0–52.0)
HEMOGLOBIN: 15.8 g/dL (ref 13.0–17.0)
MCH: 33.5 pg (ref 26.0–34.0)
MCHC: 34.7 g/dL (ref 30.0–36.0)
MCV: 96.4 fL (ref 78.0–100.0)
Platelets: 182 10*3/uL (ref 150–400)
RBC: 4.72 MIL/uL (ref 4.22–5.81)
RDW: 12.8 % (ref 11.5–15.5)
WBC: 6.6 10*3/uL (ref 4.0–10.5)

## 2018-04-27 LAB — I-STAT TROPONIN, ED
TROPONIN I, POC: 0 ng/mL (ref 0.00–0.08)
TROPONIN I, POC: 0 ng/mL (ref 0.00–0.08)

## 2018-04-27 MED ORDER — KETOROLAC TROMETHAMINE 30 MG/ML IJ SOLN
15.0000 mg | Freq: Once | INTRAMUSCULAR | Status: AC
  Administered 2018-04-27: 15 mg via INTRAVENOUS
  Filled 2018-04-27: qty 1

## 2018-04-27 MED ORDER — GI COCKTAIL ~~LOC~~
30.0000 mL | Freq: Once | ORAL | Status: AC
  Administered 2018-04-27: 30 mL via ORAL
  Filled 2018-04-27: qty 30

## 2018-04-27 MED ORDER — ONDANSETRON HCL 4 MG/2ML IJ SOLN
4.0000 mg | Freq: Once | INTRAMUSCULAR | Status: AC
  Administered 2018-04-27: 4 mg via INTRAVENOUS
  Filled 2018-04-27: qty 2

## 2018-04-27 MED ORDER — HYDROCODONE-ACETAMINOPHEN 5-325 MG PO TABS: 1.0000 | ORAL_TABLET | ORAL | 0 refills | Status: DC | PRN

## 2018-04-27 MED ORDER — MORPHINE SULFATE (PF) 4 MG/ML IV SOLN
4.0000 mg | Freq: Once | INTRAVENOUS | Status: AC
  Administered 2018-04-27: 4 mg via INTRAVENOUS
  Filled 2018-04-27: qty 1

## 2018-04-27 MED ORDER — IOPAMIDOL (ISOVUE-300) INJECTION 61%
100.0000 mL | Freq: Once | INTRAVENOUS | Status: AC | PRN
  Administered 2018-04-27: 100 mL via INTRAVENOUS

## 2018-04-27 MED ORDER — SODIUM CHLORIDE 0.9 % IV BOLUS
500.0000 mL | Freq: Once | INTRAVENOUS | Status: AC
  Administered 2018-04-27: 500 mL via INTRAVENOUS

## 2018-04-27 NOTE — Discharge Instructions (Addendum)
You were evaluated in the emergency department for lower chest and upper abdominal pain.  You had lab work ultrasound and CAT scan that did not show a definitive cause of your pain.  It is possibly related to your gallbladder and will be important for you to follow-up with your GI doctor.  We are prescribing you a short course of some pain medicine to use as needed but if you experience more severe pain or fever or other concerning symptoms please return to the emergency department.

## 2018-04-27 NOTE — ED Provider Notes (Signed)
Patient signed out to me from Dr. Mariane Baumgarten.  72 year old male here with atypical chest pain that seems to move evolved into more upper abdominal pain.  He is pending a second troponin and right upper quadrant ultrasound.  If these are unremarkable the plan is for him to be discharged to follow-up with his PCP.  Clinical Course as of Apr 28 1815  Sat Apr 27, 2018  0845 Patient complaining of some waves of upper abdominal pain.  Toradol given, no relief.  Morphine ordered.  Awaiting ultrasound.   [MB]  0901 Second troponin negative.   [MB]  6734 Right upper quadrant ultrasound completed showing sludge but no gallstones and no signs of cholecystitis.   [MB]  1013 I reevaluated the patient.  He said his pain is improved with the morphine but he still knows that there.  I think it be reasonable to proceed with a CAT scan to make sure there is nothing else going on.   [MB]  1229 Reviewed the results with the patient he said his pain is much improved.  He has a bunch of findings on his CAT scan but none sound new to the patient when I reviewed it with him.  I think this is most likely still gallbladder.  He follows with Dr. Gala Romney from GI and is anticipated to get a scope soon for that lower esophageal thickening.  I recommended that he contact him on Monday as he may end up needing a HIDA or further work-up.  I also gave him good return instructions if he is worsening in any way.   [MB]    Clinical Course User Index [MB] Hayden Rasmussen, MD      Hayden Rasmussen, MD 04/27/18 [phone]

## 2018-04-27 NOTE — ED Triage Notes (Signed)
Patient has mid-sternal chest pain that started around 1/2 hour ago, feels like heart is skipping a beat.

## 2018-04-27 NOTE — ED Notes (Signed)
Ambulated to restroom and back

## 2018-04-27 NOTE — ED Provider Notes (Addendum)
[facility] EMERGENCY DEPARTMENT Provider Note   CSN: [id number] Arrival date & time: 04/27/18  5427     History   Chief Complaint Chief Complaint  Patient presents with  . Chest Pain    HPI Daniel Guerrero is a 72 y.o. male.  Patient presents to the emergency department for evaluation of chest pain.  Symptoms began 30 minutes ago.  He reports that initially thought it was his normal indigestion, was a 6 out of 10 pain in the center of his chest without radiation.  No shortness of breath, nausea or diaphoresis.  Patient reports that his discomfort started to ease off but then he started feeling palpitations.  He does have a history of SVT, but this feels different.  No history of coronary artery disease.     Past Medical History:  Diagnosis Date  . Allergy   . Cataract of both eyes    implants both eyes   . Eczema   . Enlarged aorta ([place])   . Erectile dysfunction   . Hearing loss    high frequency  . History of inguinal hernia repair, bilateral   . Hypertension   . Kidney stones   . Loss of hearing    high frequency  . Meniere disease   . Obesity   . Paroxysmal SVT (supraventricular tachycardia) ([place])   . Psoriasis   . S/P appendectomy   . S/P tonsillectomy   . Seasonal allergies   . Type II or unspecified type diabetes mellitus without mention of complication, uncontrolled    12-29-14 Told he was prediabetic once    Patient Active Problem List   Diagnosis Date Noted  . Asymmetrical sensorineural hearing loss 02/06/2018  . BPH (benign prostatic hyperplasia) 07/27/2017  . Headache 12/19/2016  . Varicose veins of right lower extremity with complications 01/14/7627  . Hyperglycemia 04/14/2016  . Hyperlipidemia 04/14/2016  . Sinus bradycardia 06/10/2015  . Asthma with acute exacerbation 11/09/2014  . Benign paroxysmal positional vertigo 04/16/2014  . LLQ pain 01/15/2014  . Hypertension   . Eczema   . Erectile dysfunction   . Paroxysmal SVT (supraventricular  tachycardia) ([place]) 11/11/2012  . Headache, common migraine, intractable 09/09/2010  . DYSPHAGIA 08/04/2010  . Allergic rhinitis 08/01/2010  . Overweight 12/13/2007  . MIXED HEARING LOSS BILATERAL 12/13/2007  . CONTACT DERMATITIS&OTH ECZEMA DUE OTH [place] AGENT 12/13/2007  . PSORIASIS 12/13/2007    Past Surgical History:  Procedure Laterality Date  . Ablasion    . APPENDECTOMY    . COLONOSCOPY     unsure where or when- states greater than 10 yrs ago per pt- was normal per pt and his wife   . CYSTOSCOPY WITH RETROGRADE PYELOGRAM, URETEROSCOPY AND STENT PLACEMENT Left 12/30/2014   Procedure: CYSTOSCOPY WITH LEFT URETEROSCOPY STONE EXTRACTION WITH STENT;  Surgeon: Franchot Gallo, MD;  Location: [facility];  Service: Urology;  Laterality: Left;  . ELECTROPHYSIOLOGIC STUDY N/A 08/24/2015   Procedure: SVT Ablation;  Surgeon: Thompson Grayer, MD;  Location: [place] [facility];  Service: Cardiovascular;  Laterality: N/A;  . ENDOVENOUS ABLATION SAPHENOUS VEIN W/ LASER Right 12/04/2016   endovenous laser ablation right greater saphenous vein by Tinnie Gens MD   . ESOPHAGOGASTRODUODENOSCOPY N/A 08/18/2016   Procedure: ESOPHAGOGASTRODUODENOSCOPY (EGD);  Surgeon: [name], MD;  Location: [facility];  Service: Endoscopy;  Laterality: N/A;  . ESOPHAGOGASTRODUODENOSCOPY N/A 09/06/2016   Procedure: ESOPHAGOGASTRODUODENOSCOPY (EGD);  Surgeon: [name], MD;  Location: [facility];  Service: Endoscopy;  Laterality: N/A;  100 - moved to 2/14 @ 2:15 per Tretha Sciara  . EYE SURGERY     Catracts removed both eye 30 yrs ago  . HERNIA REPAIR     right and left  . HOLMIUM LASER APPLICATION Left 12/26/3327   Procedure: HOLMIUM LASER APPLICATION;  Surgeon: Franchot Gallo, MD;  Location: [facility];  Service: Urology;  Laterality: Left;  Marland Kitchen MALONEY DILATION N/A 09/06/2016   Procedure: Venia Minks DILATION;  Surgeon: [name], MD;  Location[facility];  Service: Endoscopy;  Laterality: N/A;  . TONSILLECTOMY            Home Medications    Prior to Admission medications   Medication Sig Start Date End Date Taking? Authorizing Provider  amLODipine (NORVASC) 5 MG tablet TAKE ONE TABLET BY MOUTH DAILY (DUE FOR PHYSICAL WITH CORY) Patient taking differently: Take 5 mg by mouth daily. TAKE ONE TABLET BY MOUTH DAILY (DUE FOR PHYSICAL WITH CORY) 02/27/18  Yes Nafziger, [name], NP  cetirizine (ZYRTEC) 10 MG tablet Take 10 mg by mouth daily.   Yes [provider]  Cyanocobalamin (VITAMIN B-12 PO) Take 1 tablet by mouth daily.   Yes [provider]  desonide (DESOWEN) 0.05 % cream Apply 1 application topically 2 (two) times daily.   Yes [provider]  dutasteride (AVODART) 0.5 MG capsule Take 1 capsule (0.5 mg total) by mouth daily. 04/17/18 07/16/18 Yes Nafziger, [name], NP  fluticasone (FLONASE) 50 MCG/ACT nasal spray Place 2 sprays into both nostrils daily as needed for allergies or rhinitis.   Yes [provider]  lansoprazole (PREVACID) 30 MG capsule Take 1 capsule (30 mg total) by mouth daily. 03/20/17  Yes Rourk, Cristopher Estimable, MD  tamsulosin (FLOMAX) 0.4 MG CAPS capsule Take 1 capsule (0.4 mg total) by mouth daily. 11/21/17  Yes Nafziger, [name], NP  vitamin C (ASCORBIC ACID) 500 MG tablet Take 500 mg by mouth daily.   Yes [provider]  aspirin 81 MG chewable tablet Chew 81 mg by mouth daily.    [provider]  HYDROcodone-acetaminophen (NORCO/VICODIN) 5-325 MG tablet Take 1-2 tablets by mouth every 4 (four) hours as needed for severe pain. 04/27/18   Hayden Rasmussen, MD  ibuprofen (ADVIL,MOTRIN) 400 MG tablet Take 1 tablet (400 mg total) by mouth every 6 (six) hours as needed. 12/18/16   [name], MD    Family History Family History  Problem Relation Age of Onset  . Cancer Mother        lung  . Thyroid disease Mother   . Diabetes Father   . GI Bleed Father   . Aneurysm Father   . AAA (abdominal aortic aneurysm) Father   . Colon cancer Neg Hx   .  Colon polyps Neg Hx   . Rectal cancer Neg Hx   . Stomach cancer Neg Hx     Social History Social History   Tobacco Use  . Smoking status: Former Smoker    Last attempt to quit: 07/24/1964    Years since quitting: 53.8  . Smokeless tobacco: Never Used  . Tobacco comment: Quit 20-30 years ago; smoked pipe and cigars   Substance Use Topics  . Alcohol use: Yes    Comment: couple beers/day  . Drug use: No     Allergies   Peanut-containing drug products; Penicillins; and Quinolones   Review of Systems Review of Systems  Cardiovascular: Positive for chest pain and palpitations.  All other systems reviewed and are negative.  Physical Exam Updated Vital Signs BP (!) 166/82 (BP Location: Right Arm)   Pulse 61   Temp 98.1 F (36.7 C) (Oral)   Resp 18   Ht 5\' 10"  (1.778 m)   Wt 99.8 kg   SpO2 95%   BMI 31.57 kg/m   Physical Exam  Constitutional: He is oriented to person, place, and time. He appears well-developed and well-nourished. No distress.  HENT:  Head: Normocephalic and atraumatic.  Right Ear: Hearing normal.  Left Ear: Hearing normal.  Nose: Nose normal.  Mouth/Throat: Oropharynx is clear and moist and mucous membranes are normal.  Eyes: Pupils are equal, round, and reactive to light. Conjunctivae and EOM are normal.  Neck: Normal range of motion. Neck supple.  Cardiovascular: Regular rhythm, S1 normal and S2 normal.  Extrasystoles are present. Exam reveals no gallop and no friction rub.  No murmur heard. Pulmonary/Chest: Effort normal and breath sounds normal. No respiratory distress. He exhibits no tenderness.  Abdominal: Soft. Normal appearance and bowel sounds are normal. There is no hepatosplenomegaly. There is no tenderness. There is no rebound, no guarding, no tenderness at McBurney's point and negative Murphy's sign. No hernia.  Musculoskeletal: Normal range of motion.  Neurological: He is alert and oriented to person, place, and time. He has normal  strength. No cranial nerve deficit or sensory deficit. Coordination normal. GCS eye subscore is 4. GCS verbal subscore is 5. GCS motor subscore is 6.  Skin: Skin is warm, dry and intact. No rash noted. No cyanosis.  Psychiatric: He has a normal mood and affect. His speech is normal and behavior is normal. Thought content normal.  Nursing note and vitals reviewed.    ED Treatments / Results  Labs (all labs ordered are listed, but only abnormal results are displayed) Labs Reviewed  BASIC METABOLIC PANEL - Abnormal; Notable for the following components:      Result Value   Glucose, Bld 135 (*)    All other components within normal limits  HEPATIC FUNCTION PANEL - Abnormal; Notable for the following components:   AST 59 (*)    All other components within normal limits  CBC  LIPASE, BLOOD  I-STAT TROPONIN, ED  I-STAT TROPONIN, ED    EKG EKG Interpretation  Date/Time:  Saturday April 27 2018 05:12:33 EDT Ventricular Rate:  63 PR Interval:    QRS Duration: 96 QT Interval:  428 QTC Calculation: 439 R Axis:   -14 Text Interpretation:  Sinus rhythm Ventricular premature complex Prolonged PR interval Abnormal R-wave progression, early transition Nonspecific T abnormalities, lateral leads Baseline wander in lead(s) V1 No significant change since last tracing Confirmed by Orpah Greek [phone]) on 04/27/2018 5:20:53 AM   Radiology No results found.  Procedures Procedures (including critical care time)  Medications Ordered in ED Medications  gi cocktail (Maalox,Lidocaine,Donnatal) (30 mLs Oral Given 04/27/18 0527)  sodium chloride 0.9 % bolus 500 mL (0 mLs Intravenous Stopped 04/27/18 0807)  ondansetron (ZOFRAN) injection 4 mg (4 mg Intravenous Given 04/27/18 0605)  ketorolac (TORADOL) 30 MG/ML injection 15 mg (15 mg Intravenous Given 04/27/18 0810)  morphine 4 MG/ML injection 4 mg (4 mg Intravenous Given 04/27/18 0925)  iopamidol (ISOVUE-300) 61 % injection 100 mL (100 mLs  Intravenous Contrast Given 04/27/18 1122)     Initial Impression / Assessment and Plan / ED Course  I have reviewed the triage vital signs and the nursing notes.  Pertinent labs & imaging results that were available during my care of the patient were reviewed  by me and considered in my medical decision making (see chart for details).  Clinical Course as of May 03 341  Sat Apr 27, 2018  0845 Patient complaining of some waves of upper abdominal pain.  Toradol given, no relief.  Morphine ordered.  Awaiting ultrasound.   [MB]  0901 Second troponin negative.   [MB]  1740 Right upper quadrant ultrasound completed showing sludge but no gallstones and no signs of cholecystitis.   [MB]  1013 I reevaluated the patient.  He said his pain is improved with the morphine but he still knows that there.  I think it be reasonable to proceed with a CAT scan to make sure there is nothing else going on.   [MB]  1229 Reviewed the results with the patient he said his pain is much improved.  He has a bunch of findings on his CAT scan but none sound new to the patient when I reviewed it with him.  I think this is most likely still gallbladder.  He follows with Dr. Gala Romney from GI and is anticipated to get a scope soon for that lower esophageal thickening.  I recommended that he contact him on Monday as he may end up needing a HIDA or further work-up.  I also gave him good return instructions if he is worsening in any way.   [MB]    Clinical Course User Index [MB] Hayden Rasmussen, MD    Patient presents to the emergency department for evaluation of chest pain.  Pain began approximately 30 minutes before coming to the ER.  He had onset of a heavy pain in the center of his chest without radiation.  He initially thought it was his normal indigestion.  He then, however, started having "skipped heartbeats".  He reports that when the to occur together he became nervous and came to the ER.  No history of coronary artery  disease.  He is not experiencing any shortness of breath.  EKG unremarkable at arrival.  First troponin negative.  While here in the ER he became acutely nauseated, vomited once and then the pain was down into the epigastric and upper abdomen region.  Repeat examination revealed tenderness in the epigastric but no guarding or rebound.  Hepatic panel, lipase added on, will obtain right upper quadrant ultrasound.  Will also have patient monitored and obtain second troponin.  Will sign out to oncoming ER physician to follow-up pending tests.  Final Clinical Impressions(s) / ED Diagnoses   Final diagnoses:  Chest pain, unspecified type  Epigastric pain    ED Discharge Orders         Ordered    HYDROcodone-acetaminophen (NORCO/VICODIN) 5-325 MG tablet  Every 4 hours PRN     04/27/18 1236           Orpah Greek, MD 04/27/18 8144    Orpah Greek, MD 05/03/18 [phone]

## 2018-04-28 DIAGNOSIS — N281 Cyst of kidney, acquired: Secondary | ICD-10-CM | POA: Diagnosis not present

## 2018-04-28 DIAGNOSIS — R112 Nausea with vomiting, unspecified: Secondary | ICD-10-CM | POA: Diagnosis not present

## 2018-04-28 DIAGNOSIS — R1031 Right lower quadrant pain: Secondary | ICD-10-CM | POA: Diagnosis not present

## 2018-04-28 DIAGNOSIS — Z8669 Personal history of other diseases of the nervous system and sense organs: Secondary | ICD-10-CM | POA: Diagnosis not present

## 2018-04-28 DIAGNOSIS — R0902 Hypoxemia: Secondary | ICD-10-CM | POA: Diagnosis present

## 2018-04-28 DIAGNOSIS — K810 Acute cholecystitis: Secondary | ICD-10-CM | POA: Diagnosis present

## 2018-04-28 DIAGNOSIS — H9190 Unspecified hearing loss, unspecified ear: Secondary | ICD-10-CM | POA: Diagnosis present

## 2018-04-28 DIAGNOSIS — N400 Enlarged prostate without lower urinary tract symptoms: Secondary | ICD-10-CM | POA: Diagnosis present

## 2018-04-28 DIAGNOSIS — I10 Essential (primary) hypertension: Secondary | ICD-10-CM | POA: Diagnosis present

## 2018-04-28 DIAGNOSIS — Z880 Allergy status to penicillin: Secondary | ICD-10-CM | POA: Diagnosis not present

## 2018-04-28 DIAGNOSIS — K828 Other specified diseases of gallbladder: Secondary | ICD-10-CM | POA: Diagnosis present

## 2018-04-28 DIAGNOSIS — J9811 Atelectasis: Secondary | ICD-10-CM | POA: Diagnosis not present

## 2018-04-28 DIAGNOSIS — K529 Noninfective gastroenteritis and colitis, unspecified: Secondary | ICD-10-CM | POA: Diagnosis present

## 2018-04-28 DIAGNOSIS — I440 Atrioventricular block, first degree: Secondary | ICD-10-CM | POA: Diagnosis present

## 2018-04-28 DIAGNOSIS — K82A1 Gangrene of gallbladder in cholecystitis: Secondary | ICD-10-CM | POA: Diagnosis not present

## 2018-04-28 DIAGNOSIS — K812 Acute cholecystitis with chronic cholecystitis: Secondary | ICD-10-CM | POA: Diagnosis not present

## 2018-04-28 DIAGNOSIS — I7090 Unspecified atherosclerosis: Secondary | ICD-10-CM | POA: Diagnosis present

## 2018-04-28 DIAGNOSIS — R1011 Right upper quadrant pain: Secondary | ICD-10-CM | POA: Diagnosis not present

## 2018-04-28 DIAGNOSIS — K7689 Other specified diseases of liver: Secondary | ICD-10-CM | POA: Diagnosis present

## 2018-04-28 DIAGNOSIS — Z6833 Body mass index (BMI) 33.0-33.9, adult: Secondary | ICD-10-CM | POA: Diagnosis not present

## 2018-04-28 DIAGNOSIS — Z87891 Personal history of nicotine dependence: Secondary | ICD-10-CM | POA: Diagnosis not present

## 2018-04-28 DIAGNOSIS — R9389 Abnormal findings on diagnostic imaging of other specified body structures: Secondary | ICD-10-CM | POA: Diagnosis not present

## 2018-04-28 DIAGNOSIS — K219 Gastro-esophageal reflux disease without esophagitis: Secondary | ICD-10-CM | POA: Diagnosis present

## 2018-05-01 MED ORDER — ACETAMINOPHEN 325 MG PO TABS
650.00 | ORAL_TABLET | ORAL | Status: DC
Start: ? — End: 2018-05-01

## 2018-05-01 MED ORDER — OXYCODONE HCL 5 MG PO TABS
5.00 | ORAL_TABLET | ORAL | Status: DC
Start: ? — End: 2018-05-01

## 2018-05-01 MED ORDER — HEPARIN SODIUM (PORCINE) 5000 UNIT/ML IJ SOLN
5000.00 | INTRAMUSCULAR | Status: DC
Start: 2018-05-01 — End: 2018-05-01

## 2018-05-01 MED ORDER — TAMSULOSIN HCL 0.4 MG PO CAPS
0.40 | ORAL_CAPSULE | ORAL | Status: DC
Start: 2018-05-01 — End: 2018-05-01

## 2018-05-01 MED ORDER — ALBUTEROL SULFATE HFA 108 (90 BASE) MCG/ACT IN AERS
2.00 | INHALATION_SPRAY | RESPIRATORY_TRACT | Status: DC
Start: ? — End: 2018-05-01

## 2018-05-01 MED ORDER — DIPHENHYDRAMINE HCL 25 MG PO CAPS
25.00 | ORAL_CAPSULE | ORAL | Status: DC
Start: ? — End: 2018-05-01

## 2018-05-01 MED ORDER — ONDANSETRON 4 MG PO TBDP
4.00 | ORAL_TABLET | ORAL | Status: DC
Start: ? — End: 2018-05-01

## 2018-05-03 ENCOUNTER — Telehealth: Payer: Self-pay | Admitting: *Deleted

## 2018-05-03 NOTE — Telephone Encounter (Signed)
Patient called in stating he had gallbladder surgery done by [facility] on 04/29/18. He is scheduled for TCS/EGD on 06/06/18. Patient wants to know should he reschedule this procedure since he just recently had surgery. Please advise Dr. Gala Romney, Thanks!

## 2018-05-03 NOTE — Telephone Encounter (Signed)
Noted.  He should wait 12 weeks.  Needs to come back to the office briefly before rescheduling.  And there is the answer to your question from the other day.  This opens up a slot for [name].  Thanks.

## 2018-05-03 NOTE — Telephone Encounter (Signed)
Called spoke with patient. He is aware of RMR recs. Pending appt scheduled for 07/02/18.   Called carolyn in endo and procedure cancelled.

## 2018-05-06 ENCOUNTER — Emergency Department (HOSPITAL_COMMUNITY)
Admission: EM | Admit: 2018-05-06 | Discharge: 2018-05-06 | Disposition: A | Discharge status: Home / Self Care | Payer: Medicare Other | Attending: Emergency Medicine | Admitting: Emergency Medicine

## 2018-05-06 ENCOUNTER — Ambulatory Visit: Payer: Self-pay

## 2018-05-06 ENCOUNTER — Telehealth: Payer: Self-pay | Admitting: Adult Health

## 2018-05-06 ENCOUNTER — Encounter (HOSPITAL_COMMUNITY): Payer: Self-pay | Admitting: Emergency Medicine

## 2018-05-06 DIAGNOSIS — Z7982 Long term (current) use of aspirin: Secondary | ICD-10-CM | POA: Diagnosis not present

## 2018-05-06 DIAGNOSIS — I10 Essential (primary) hypertension: Secondary | ICD-10-CM | POA: Diagnosis not present

## 2018-05-06 DIAGNOSIS — Z79899 Other long term (current) drug therapy: Secondary | ICD-10-CM | POA: Insufficient documentation

## 2018-05-06 DIAGNOSIS — R350 Frequency of micturition: Secondary | ICD-10-CM | POA: Diagnosis not present

## 2018-05-06 DIAGNOSIS — Z91010 Allergy to peanuts: Secondary | ICD-10-CM | POA: Insufficient documentation

## 2018-05-06 DIAGNOSIS — R140 Abdominal distension (gaseous): Secondary | ICD-10-CM | POA: Diagnosis not present

## 2018-05-06 DIAGNOSIS — E119 Type 2 diabetes mellitus without complications: Secondary | ICD-10-CM | POA: Insufficient documentation

## 2018-05-06 DIAGNOSIS — Z87891 Personal history of nicotine dependence: Secondary | ICD-10-CM | POA: Diagnosis not present

## 2018-05-06 DIAGNOSIS — R339 Retention of urine, unspecified: Secondary | ICD-10-CM | POA: Diagnosis not present

## 2018-05-06 LAB — CBC WITH DIFFERENTIAL/PLATELET
Abs Immature Granulocytes: 0.22 10*3/uL — ABNORMAL HIGH (ref 0.00–0.07)
Basophils Absolute: 0 10*3/uL (ref 0.0–0.1)
Basophils Relative: 0 %
Eosinophils Absolute: 0.2 10*3/uL (ref 0.0–0.5)
Eosinophils Relative: 3 %
HCT: 40.9 % (ref 39.0–52.0)
Hemoglobin: 13.5 g/dL (ref 13.0–17.0)
Immature Granulocytes: 3 %
Lymphocytes Relative: 23 %
Lymphs Abs: 1.6 10*3/uL (ref 0.7–4.0)
MCH: 31 pg (ref 26.0–34.0)
MCHC: 33 g/dL (ref 30.0–36.0)
MCV: 94 fL (ref 80.0–100.0)
Monocytes Absolute: 0.8 10*3/uL (ref 0.1–1.0)
Monocytes Relative: 12 %
Neutro Abs: 4.1 10*3/uL (ref 1.7–7.7)
Neutrophils Relative %: 59 %
Platelets: 332 10*3/uL (ref 150–400)
RBC: 4.35 MIL/uL (ref 4.22–5.81)
RDW: 11.9 % (ref 11.5–15.5)
WBC: 6.9 10*3/uL (ref 4.0–10.5)
nRBC: 0 % (ref 0.0–0.2)

## 2018-05-06 LAB — URINALYSIS, ROUTINE W REFLEX MICROSCOPIC
Bilirubin Urine: NEGATIVE
Glucose, UA: NEGATIVE mg/dL
Hgb urine dipstick: NEGATIVE
Ketones, ur: 20 mg/dL — AB
Leukocytes, UA: NEGATIVE
Nitrite: NEGATIVE
Protein, ur: NEGATIVE mg/dL
Specific Gravity, Urine: 1.011 (ref 1.005–1.030)
pH: 6 (ref 5.0–8.0)

## 2018-05-06 LAB — BASIC METABOLIC PANEL
Anion gap: 9 (ref 5–15)
BUN: 8 mg/dL (ref 8–23)
CO2: 23 mmol/L (ref 22–32)
Calcium: 8.9 mg/dL (ref 8.9–10.3)
Chloride: 105 mmol/L (ref 98–111)
Creatinine, Ser: 1.14 mg/dL (ref 0.61–1.24)
GFR calc Af Amer: >60 mL/min (ref >60)
GFR calc non Af Amer: >60 mL/min (ref >60)
Glucose, Bld: 107 mg/dL — ABNORMAL HIGH (ref 70–99)
Potassium: 3.6 mmol/L (ref 3.5–5.1)
Sodium: 137 mmol/L (ref 135–145)

## 2018-05-06 NOTE — Discharge Instructions (Addendum)
Please return for any problem.  Follow-up with your regular doctor as instructed.  Follow-up with urology as instructed.  If you experience difficulty urinating and cannot produce urine you should come immediately to the ER for evaluation.

## 2018-05-06 NOTE — Telephone Encounter (Signed)
Will hold message to see if the pt goes to ED.

## 2018-05-06 NOTE — ED Notes (Signed)
Discharge instructions discussed with Pt. Pt verbalized understanding. Pt stable and ambulatory.    

## 2018-05-06 NOTE — ED Notes (Signed)
Pt ambulatory to bathroom with no issues.  

## 2018-05-06 NOTE — ED Triage Notes (Signed)
Pt had his gall bladder removed 1 week ago at [facility]. Pt has had prostate issues in the past, states he has pain to prostate again but this time it is worse and he has difficulty voiding.

## 2018-05-06 NOTE — Telephone Encounter (Signed)
Pt called with C/O difficulty passing his urine.  He states that he cant tell what color his urine is because he is passing so little.His pain is rated at 9. He states that he took his flomax last night and then was able to pass a decent amount of urine and had some relief. Pt states this problem started when his gull bladder was remove at [facility] last week. Per protocol pt will go to ED for his symptoms. Care advice given to patient. Pt verbalized understanding of all instruction. Reason for Disposition . [1] Unable to urinate (or only a few drops) > 4 hours AND     [2] bladder feels very full (e.g., palpable bladder or strong urge to urinate)  Answer Assessment - Initial Assessment Questions 1. SYMPTOM: "What's the main symptom you're concerned about?" (e.g., frequency, incontinence)     Not emptying bladder  2. ONSET: "When did the  retention  start?"     Last week 3. PAIN: "Is there any pain?" If so, ask: "How bad is it?" (Scale: 1-10; mild, moderate, severe)     9 4. CAUSE: "What do you think is causing the symptoms?"     Surgery (gull bladder) 5. OTHER SYMPTOMS: "Do you have any other symptoms?" (e.g., fever, flank pain, blood in urine, pain with urination)     Pain   6. PREGNANCY: "Is there any chance you are pregnant?" "When was your last menstrual period?"     N/A  Protocols used: URINARY [facility]

## 2018-05-06 NOTE — Telephone Encounter (Signed)
Copied from [place] [phone]. Topic: Quick Communication - Rx Refill/Question >> May 06, 2018  9:09 AM Sheran Luz wrote: Medication: tamsulosin (FLOMAX) 0.4 MG CAPS capsule  Pt would like to know if his PCP would advise doubling this medication, stating that he is still experiencing difficulty with urination. Pt states that he has a 3 mo supply of this medication.

## 2018-05-06 NOTE — Telephone Encounter (Signed)
Pt seen in ED 

## 2018-05-06 NOTE — ED Notes (Signed)
Bladder scan 0ml

## 2018-05-06 NOTE — ED Provider Notes (Signed)
[facility] EMERGENCY DEPARTMENT Provider Note   CSN: [id number] Arrival date & time: 05/06/18  1353     History   Chief Complaint Chief Complaint  Patient presents with  . prostate pain  . Urinary Retention    HPI Daniel Guerrero is a 72 y.o. male.  72 year old male with prior medical history as detailed below presents for evaluation of possible urinary retention.  Patient reports recent removal of his gallbladder at [facility] 8 days ago.  Ever since that surgery he has felt somewhat bloated in his abdomen.  He has also had intermittent symptoms of mild urinary retention.  Today the symptoms of retention were worse and he came to the ED.  He denies fever.  He denies current abdominal pain.  He reports that he was able to urinate while in the ED with a normal incomplete void.  He denies dysuria.  He reports the bloated sensation from last week is improved significantly.  His last bowel movement was earlier today and he denies symptoms of constipation.  In short, upon my evaluation he reports that his symptoms improved significantly and he declines further work-up and/or evaluation. He does report a prior history of an enlarged prostate.  He has never had urinary retention to the point requiring Foley catheterization.  He denies any other prior urologic interventions.  The history is provided by the patient and medical records.  Urinary Frequency  This is a new problem. The current episode started more than 2 days ago. The problem occurs daily. The problem has been resolved. Pertinent negatives include no chest pain, no abdominal pain, no headaches and no shortness of breath. Nothing aggravates the symptoms. Nothing relieves the symptoms. He has tried nothing for the symptoms.    Past Medical History:  Diagnosis Date  . Allergy   . Cataract of both eyes    implants both eyes   . Eczema   . Enlarged aorta ([place])   . Erectile dysfunction   . Hearing loss    high  frequency  . History of inguinal hernia repair, bilateral   . Hypertension   . Kidney stones   . Loss of hearing    high frequency  . Meniere disease   . Obesity   . Paroxysmal SVT (supraventricular tachycardia) ([place])   . Psoriasis   . S/P appendectomy   . S/P tonsillectomy   . Seasonal allergies   . Type II or unspecified type diabetes mellitus without mention of complication, uncontrolled    12-29-14 Told he was prediabetic once    Patient Active Problem List   Diagnosis Date Noted  . Asymmetrical sensorineural hearing loss 02/06/2018  . BPH (benign prostatic hyperplasia) 07/27/2017  . Headache 12/19/2016  . Varicose veins of right lower extremity with complications 30/16/0109  . Hyperglycemia 04/14/2016  . Hyperlipidemia 04/14/2016  . Sinus bradycardia 06/10/2015  . Asthma with acute exacerbation 11/09/2014  . Benign paroxysmal positional vertigo 04/16/2014  . LLQ pain 01/15/2014  . Hypertension   . Eczema   . Erectile dysfunction   . Paroxysmal SVT (supraventricular tachycardia) ([place]) 11/11/2012  . Headache, common migraine, intractable 09/09/2010  . DYSPHAGIA 08/04/2010  . Allergic rhinitis 08/01/2010  . Overweight 12/13/2007  . MIXED HEARING LOSS BILATERAL 12/13/2007  . CONTACT DERMATITIS&OTH ECZEMA DUE OTH [place] AGENT 12/13/2007  . PSORIASIS 12/13/2007    Past Surgical History:  Procedure Laterality Date  . Ablasion    . APPENDECTOMY    . CHOLECYSTECTOMY    .  COLONOSCOPY     unsure where or when- states greater than 10 yrs ago per pt- was normal per pt and his wife   . CYSTOSCOPY WITH RETROGRADE PYELOGRAM, URETEROSCOPY AND STENT PLACEMENT Left 12/30/2014   Procedure: CYSTOSCOPY WITH LEFT URETEROSCOPY STONE EXTRACTION WITH STENT;  Surgeon: Franchot Gallo, MD;  [facility];  Service: Urology;  Laterality: Left;  . ELECTROPHYSIOLOGIC STUDY N/A 08/24/2015   Procedure: SVT Ablation;  Surgeon: Thompson Grayer, MD;  Location: [place] [facility];  Service:  Cardiovascular;  Laterality: N/A;  . ENDOVENOUS ABLATION SAPHENOUS VEIN W/ LASER Right 12/04/2016   endovenous laser ablation right greater saphenous vein by Tinnie Gens MD   . ESOPHAGOGASTRODUODENOSCOPY N/A 08/18/2016   Procedure: ESOPHAGOGASTRODUODENOSCOPY (EGD);  Surgeon: [name], MD;  [facility];  Service: Endoscopy;  Laterality: N/A;  . ESOPHAGOGASTRODUODENOSCOPY N/A 09/06/2016   Procedure: ESOPHAGOGASTRODUODENOSCOPY (EGD);  Surgeon: [name], MD;  [facility];  Service: Endoscopy;  Laterality: N/A;  100 - moved to 2/14 @ 2:15 per Tretha Sciara  . EYE SURGERY     Catracts removed both eye 30 yrs ago  . HERNIA REPAIR     right and left  . HOLMIUM LASER APPLICATION Left 11/24/84   Procedure: HOLMIUM LASER APPLICATION;  Surgeon: Franchot Gallo, MD;  [facility];  Service: Urology;  Laterality: Left;  Marland Kitchen MALONEY DILATION N/A 09/06/2016   Procedure: Venia Minks DILATION;  Surgeon: [name], MD;  [facility];  Service: Endoscopy;  Laterality: N/A;  . TONSILLECTOMY          Home Medications    Prior to Admission medications   Medication Sig Start Date End Date Taking? Authorizing Provider  amLODipine (NORVASC) 5 MG tablet TAKE ONE TABLET BY MOUTH DAILY (DUE FOR PHYSICAL WITH CORY) Patient taking differently: Take 5 mg by mouth daily. TAKE ONE TABLET BY MOUTH DAILY (DUE FOR PHYSICAL WITH CORY) 02/27/18   Nafziger, [name], NP  aspirin 81 MG chewable tablet Chew 81 mg by mouth daily.    [provider]  cetirizine (ZYRTEC) 10 MG tablet Take 10 mg by mouth daily.    [provider]  Cyanocobalamin (VITAMIN B-12 PO) Take 1 tablet by mouth daily.    [provider]  desonide (DESOWEN) 0.05 % cream Apply 1 application topically 2 (two) times daily.    [provider]  dutasteride (AVODART) 0.5 MG capsule Take 1 capsule (0.5 mg total) by mouth daily. 04/17/18 07/16/18  Nafziger, [name], NP  fluticasone (FLONASE) 50  MCG/ACT nasal spray Place 2 sprays into both nostrils daily as needed for allergies or rhinitis.    [provider]  HYDROcodone-acetaminophen (NORCO/VICODIN) 5-325 MG tablet Take 1-2 tablets by mouth every 4 (four) hours as needed for severe pain. 04/27/18   Hayden Rasmussen, MD  ibuprofen (ADVIL,MOTRIN) 400 MG tablet Take 1 tablet (400 mg total) by mouth every 6 (six) hours as needed. 12/18/16   [name], MD  lansoprazole (PREVACID) 30 MG capsule Take 1 capsule (30 mg total) by mouth daily. 03/20/17   Rourk, Cristopher Estimable, MD  tamsulosin (FLOMAX) 0.4 MG CAPS capsule Take 1 capsule (0.4 mg total) by mouth daily. 11/21/17   Nafziger, [name], NP  vitamin C (ASCORBIC ACID) 500 MG tablet Take 500 mg by mouth daily.    [provider]    Family History Family History  Problem Relation Age of Onset  . Cancer Mother        lung  .  Thyroid disease Mother   . Diabetes Father   . GI Bleed Father   . Aneurysm Father   . AAA (abdominal aortic aneurysm) Father   . Colon cancer Neg Hx   . Colon polyps Neg Hx   . Rectal cancer Neg Hx   . Stomach cancer Neg Hx     Social History Social History   Tobacco Use  . Smoking status: Former Smoker    Last attempt to quit: 07/24/1964    Years since quitting: 53.8  . Smokeless tobacco: Never Used  . Tobacco comment: Quit 20-30 years ago; smoked pipe and cigars   Substance Use Topics  . Alcohol use: Yes    Comment: couple beers/day  . Drug use: No     Allergies   Peanut-containing drug products; Penicillins; and Quinolones   Review of Systems Review of Systems  Respiratory: Negative for shortness of breath.   Cardiovascular: Negative for chest pain.  Gastrointestinal: Negative for abdominal pain.  Genitourinary: Positive for frequency.  Neurological: Negative for headaches.  All other systems reviewed and are negative.    Physical Exam Updated Vital Signs BP (!) 146/80   Pulse 66   Temp 97.9 F (36.6 C)   Resp 18   SpO2 99%    Physical Exam   ED Treatments / Results  Labs (all labs ordered are listed, but only abnormal results are displayed) Labs Reviewed  CBC WITH DIFFERENTIAL/PLATELET - Abnormal; Notable for the following components:      Result Value   Abs Immature Granulocytes 0.22 (*)    All other components within normal limits  BASIC METABOLIC PANEL - Abnormal; Notable for the following components:   Glucose, Bld 107 (*)    All other components within normal limits  URINALYSIS, ROUTINE W REFLEX MICROSCOPIC - Abnormal; Notable for the following components:   Ketones, ur 20 (*)    All other components within normal limits    EKG None  Radiology No results found.  Procedures Procedures (including critical care time)  Medications Ordered in ED Medications - No data to display   Initial Impression / Assessment and Plan / ED Course  I have reviewed the triage vital signs and the nursing notes.  Pertinent labs & imaging results that were available during my care of the patient were reviewed by me and considered in my medical decision making (see chart for details).     MDM  Screen complete  Patient is presenting for evaluation of possible urinary retention.  Patient has voided just prior to my evaluation.  His bladder scan is now at 0.  He does not appear to have significant retention.  Screening labs otherwise are without significant abnormality.  Patient now feels significantly improved.  He declines further work-up or treatment.  He may benefit from outpatient follow-up with urology.  Urology follow-up provided.  Strict return precautions given and understood.  Importance of close follow-up was stressed.  Final Clinical Impressions(s) / ED Diagnoses   Final diagnoses:  Urinary retention    ED Discharge Orders    None       Valarie Merino, MD 05/06/18 1836

## 2018-05-07 NOTE — Telephone Encounter (Signed)
Ok to increase Flomax to 0.8 mg ( 2 pills)

## 2018-05-07 NOTE — Telephone Encounter (Signed)
Called and spoke to the pt.  Advised that [name] did approve the increase in medication.  Notified the pt to take 2 tabs daily and call when ready for a refill.  No further action required.

## 2018-05-29 DIAGNOSIS — R3912 Poor urinary stream: Secondary | ICD-10-CM | POA: Diagnosis not present

## 2018-05-29 DIAGNOSIS — R3911 Hesitancy of micturition: Secondary | ICD-10-CM | POA: Diagnosis not present

## 2018-05-29 DIAGNOSIS — R351 Nocturia: Secondary | ICD-10-CM | POA: Diagnosis not present

## 2018-05-29 DIAGNOSIS — N401 Enlarged prostate with lower urinary tract symptoms: Secondary | ICD-10-CM | POA: Diagnosis not present

## 2018-05-29 DIAGNOSIS — Z125 Encounter for screening for malignant neoplasm of prostate: Secondary | ICD-10-CM | POA: Diagnosis not present

## 2018-05-29 LAB — PSA: PSA: 2.5

## 2018-05-30 ENCOUNTER — Other Ambulatory Visit (HOSPITAL_COMMUNITY): Payer: Medicare Other

## 2018-05-30 ENCOUNTER — Telehealth: Payer: Self-pay | Admitting: Adult Health

## 2018-05-30 DIAGNOSIS — Z760 Encounter for issue of repeat prescription: Secondary | ICD-10-CM

## 2018-05-30 MED ORDER — FLUTICASONE PROPIONATE 50 MCG/ACT NA SUSP: 2.0000 | Freq: Every day | NASAL | 2 refills | Status: DC | PRN

## 2018-05-30 NOTE — Telephone Encounter (Signed)
Rx sent 

## 2018-05-30 NOTE — Telephone Encounter (Signed)
Copied from [place] [id number]. Topic: Quick Communication - Rx Refill/Question >> May 30, 2018 10:32 AM Scherrie Gerlach wrote: Medication: fluticasone (FLONASE) 50 MCG/ACT nasal spray  Pt states he spoke to [place] about increasing tthe dosage to 2 times a day.  Pt is now running out and needs new Rx sent to  [facility], [place], [place] - [address] [phone] (Phone) [phone] (Fax)

## 2018-05-30 NOTE — Telephone Encounter (Signed)
Sounds good. Thanks 

## 2018-05-30 NOTE — Telephone Encounter (Signed)
Okay to send in refill with new directions

## 2018-06-04 MED ORDER — TAMSULOSIN HCL 0.4 MG PO CAPS: 0.8000 mg | ORAL_CAPSULE | Freq: Every day | ORAL | 5 refills | Status: DC

## 2018-06-04 NOTE — Telephone Encounter (Signed)
Patient states that it was suppose to be   tamsulosin (FLOMAX) 0.4 MG CAPS capsule    called into [facility]. Please advise.

## 2018-06-04 NOTE — Addendum Note (Signed)
Addended by: Elie Confer on: 06/04/2018 05:00 PM   Modules accepted: Orders

## 2018-06-06 ENCOUNTER — Ambulatory Visit (HOSPITAL_COMMUNITY): Admit: 2018-06-06 | Payer: Medicare Other | Admitting: Internal Medicine

## 2018-06-06 ENCOUNTER — Encounter (HOSPITAL_COMMUNITY): Payer: Self-pay

## 2018-06-06 SURGERY — COLONOSCOPY WITH PROPOFOL
Anesthesia: Monitor Anesthesia Care

## 2018-06-10 ENCOUNTER — Ambulatory Visit: Payer: Medicare Other | Admitting: Cardiology

## 2018-07-02 ENCOUNTER — Ambulatory Visit (INDEPENDENT_AMBULATORY_CARE_PROVIDER_SITE_OTHER): Payer: Medicare Other | Admitting: Internal Medicine

## 2018-07-02 ENCOUNTER — Telehealth: Payer: Self-pay | Admitting: *Deleted

## 2018-07-02 ENCOUNTER — Other Ambulatory Visit: Payer: Self-pay

## 2018-07-02 ENCOUNTER — Encounter: Payer: Self-pay | Admitting: Internal Medicine

## 2018-07-02 VITALS — BP 162/86 | HR 62 | Temp 97.3°F | Ht 70.0 in | Wt 214.2 lb

## 2018-07-02 DIAGNOSIS — R1310 Dysphagia, unspecified: Secondary | ICD-10-CM

## 2018-07-02 DIAGNOSIS — Z86010 Personal history of colonic polyps: Secondary | ICD-10-CM

## 2018-07-02 DIAGNOSIS — R1319 Other dysphagia: Secondary | ICD-10-CM

## 2018-07-02 DIAGNOSIS — K219 Gastro-esophageal reflux disease without esophagitis: Secondary | ICD-10-CM | POA: Diagnosis not present

## 2018-07-02 NOTE — Telephone Encounter (Signed)
Pre-op scheduled for 08/19/18 at 10:00am. Letter mailed. Patient spouse aware.

## 2018-07-02 NOTE — Progress Notes (Signed)
Primary Care Physician:  Dorothyann Peng, NP Primary Gastroenterologist:  Dr. Gala Romney  Pre-Procedure History & Physical: HPI:  Daniel Guerrero is a 72 y.o. male here for follow-up of GERD and history of dysphagia.  History of colonic polyps.  Have not seen him in over a year.  In October, he developed acute cholecystitis and ending up getting his gallbladder out at [facility] (gangrenous cholecystitis).  He hass done well postoperatively.  He is noted insidiously recurrent esophageal dysphagia (history of Schatzki's ring dilated by me last year).  GERD well-controlled on Prevacid 30 mg daily.  History of multiple tubular adenomas and sessile serrated polyp removed by Dr. Henrene Pastor about 3 years ago.  He is due for surveillance.  He is not having any lower GI tract symptoms.  Past Medical History:  Diagnosis Date  . Allergy   . Cataract of both eyes    implants both eyes   . Eczema   . Enlarged aorta ([place])   . Erectile dysfunction   . Hearing loss    high frequency  . History of inguinal hernia repair, bilateral   . Hypertension   . Kidney stones   . Loss of hearing    high frequency  . Meniere disease   . Obesity   . Paroxysmal SVT (supraventricular tachycardia) ([place])   . Psoriasis   . S/P appendectomy   . S/P tonsillectomy   . Seasonal allergies   . Type II or unspecified type diabetes mellitus without mention of complication, uncontrolled    12-29-14 Told he was prediabetic once    Past Surgical History:  Procedure Laterality Date  . Ablasion    . APPENDECTOMY    . CHOLECYSTECTOMY    . COLONOSCOPY     unsure where or when- states greater than 10 yrs ago per pt- was normal per pt and his wife   . CYSTOSCOPY WITH RETROGRADE PYELOGRAM, URETEROSCOPY AND STENT PLACEMENT Left 12/30/2014   Procedure: CYSTOSCOPY WITH LEFT URETEROSCOPY STONE EXTRACTION WITH STENT;  Surgeon: Franchot Gallo, MD;  [facility];  Service: Urology;  Laterality: Left;  . ELECTROPHYSIOLOGIC STUDY  N/A 08/24/2015   Procedure: SVT Ablation;  Surgeon: Thompson Grayer, MD;  Location: [facility];  Service: Cardiovascular;  Laterality: N/A;  . ENDOVENOUS ABLATION SAPHENOUS VEIN W/ LASER Right 12/04/2016   endovenous laser ablation right greater saphenous vein by Tinnie Gens MD   . ESOPHAGOGASTRODUODENOSCOPY N/A 08/18/2016   Procedure: ESOPHAGOGASTRODUODENOSCOPY (EGD);  Surgeon: [name], MD;  [facility];  Service: Endoscopy;  Laterality: N/A;  . ESOPHAGOGASTRODUODENOSCOPY N/A 09/06/2016   Procedure: ESOPHAGOGASTRODUODENOSCOPY (EGD);  Surgeon: [name], MD;  [facility];  Service: Endoscopy;  Laterality: N/A;  100 - moved to 2/14 @ 2:15 per Tretha Sciara  . EYE SURGERY     Catracts removed both eye 30 yrs ago  . HERNIA REPAIR     right and left  . HOLMIUM LASER APPLICATION Left 09/02/5619   Procedure: HOLMIUM LASER APPLICATION;  Surgeon: Franchot Gallo, MD;  [facility];  Service: Urology;  Laterality: Left;  Marland Kitchen MALONEY DILATION N/A 09/06/2016   Procedure: Venia Minks DILATION;  Surgeon: [name], MD;  [facility];  Service: Endoscopy;  Laterality: N/A;  . TONSILLECTOMY      Prior to Admission medications   Medication Sig Start Date End Date Taking? Authorizing Provider  amLODipine (NORVASC) 5 MG tablet TAKE ONE TABLET BY MOUTH DAILY (DUE FOR PHYSICAL WITH  CORY) Patient taking differently: Take 5 mg by mouth daily. TAKE ONE TABLET BY MOUTH DAILY (DUE FOR PHYSICAL WITH CORY) 02/27/18  Yes Nafziger, [name], NP  cetirizine (ZYRTEC) 10 MG tablet Take 10 mg by mouth as needed.    Yes [provider]  Cyanocobalamin (VITAMIN B-12 PO) Take 1 tablet by mouth as needed.    Yes [provider]  desonide (DESOWEN) 0.05 % cream Apply 1 application topically as needed.    Yes [provider]  dutasteride (AVODART) 0.5 MG capsule Take 1 capsule (0.5 mg total) by mouth daily. 04/17/18 07/16/18 Yes Nafziger, [name], NP  fluticasone  (FLONASE) 50 MCG/ACT nasal spray Place 2 sprays into both nostrils daily as needed for allergies or rhinitis. 05/30/18  Yes Nafziger, [name], NP  lansoprazole (PREVACID) 30 MG capsule Take 1 capsule (30 mg total) by mouth daily. 03/20/17  Yes Timothey Dahlstrom, Cristopher Estimable, MD  tamsulosin (FLOMAX) 0.4 MG CAPS capsule Take 2 capsules (0.8 mg total) by mouth daily. 06/04/18  Yes Nafziger, [name], NP  vitamin C (ASCORBIC ACID) 500 MG tablet Take 500 mg by mouth daily.   Yes [provider]  aspirin 81 MG chewable tablet Chew 81 mg by mouth daily.    [provider]  HYDROcodone-acetaminophen (NORCO/VICODIN) 5-325 MG tablet Take 1-2 tablets by mouth every 4 (four) hours as needed for severe pain. Patient not taking: Reported on 07/02/2018 04/27/18   Hayden Rasmussen, MD  ibuprofen (ADVIL,MOTRIN) 400 MG tablet Take 1 tablet (400 mg total) by mouth every 6 (six) hours as needed. Patient not taking: Reported on 07/02/2018 12/18/16   [name], MD    Allergies as of 07/02/2018 - Review Complete 07/02/2018  Allergen Reaction Noted  . Peanut-containing drug products Hives 12/31/2009  . Penicillins Hives 03/11/2008  . Quinolones  04/11/2018    Family History  Problem Relation Age of Onset  . Cancer Mother        lung  . Thyroid disease Mother   . Diabetes Father   . GI Bleed Father   . Aneurysm Father   . AAA (abdominal aortic aneurysm) Father   . Colon cancer Neg Hx   . Colon polyps Neg Hx   . Rectal cancer Neg Hx   . Stomach cancer Neg Hx     Social History   Socioeconomic History  . Marital status: Married    Spouse name: Not on file  . Number of children: Not on file  . Years of education: Not on file  . Highest education level: Not on file  Occupational History  . Not on file  Social Needs  . Financial resource strain: Not on file  . Food insecurity:    Worry: Not on file    Inability: Not on file  . Transportation needs:    Medical: Not on file    Non-medical: Not on file    Tobacco Use  . Smoking status: Former Smoker    Last attempt to quit: 07/24/1964    Years since quitting: 53.9  . Smokeless tobacco: Never Used  . Tobacco comment: Quit 20-30 years ago; smoked pipe and cigars   Substance and Sexual Activity  . Alcohol use: Yes    Comment: couple beers/day  . Drug use: No  . Sexual activity: Not on file  Lifestyle  . Physical activity:    Days per week: Not on file    Minutes per session: Not on file  . Stress: Not on file  Relationships  .  Social connections:    Talks on phone: Not on file    Gets together: Not on file    Attends religious service: Not on file    Active member of club or organization: Not on file    Attends meetings of clubs or organizations: Not on file    Relationship status: Not on file  . Intimate partner violence:    Fear of current or ex partner: Not on file    Emotionally abused: Not on file    Physically abused: Not on file    Forced sexual activity: Not on file  Other Topics Concern  . Not on file  Social History Narrative   Daily Caffeine use: 5-6 drinks daily   Exercising 2 times per week   No Drug use          Review of Systems: See HPI, otherwise negative ROS  Physical Exam: BP (!) 162/86   Pulse 62   Temp (!) 97.3 F (36.3 C) (Oral)   Ht 5\' 10"  (1.778 m)   Wt 214 lb 3.2 oz (97.2 kg)   BMI 30.73 kg/m  General:   Alert,  pleasant and cooperative in NAD Neck:  Supple; no masses or thyromegaly. No significant cervical adenopathy. Lungs:  Clear throughout to auscultation.   No wheezes, crackles, or rhonchi. No acute distress. Heart:  Regular rate and rhythm; no murmurs, clicks, rubs,  or gallops. Abdomen: Non-distended, normal bowel sounds.  Soft and nontender without appreciable mass or hepatosplenomegaly.  Pulses:  Normal pulses noted. Extremities:  Without clubbing or edema.  Impression/Plan: Pleasant 72 year old gentleman doing well  -  status post recent cholecystectomy.  History of esophageal  dysphagia in a background of GERD and known Schatzki's ring.  He could use a repeat EGD with esophageal dilation at this time.  History of multiple colonic adenomas removed about 3 years ago; due for surveillance colonoscopy at this time.  Recommendations: I have offered the patient both an EGD with esophageal dilation as well as a surveillance colonoscopy in the near future.  We will utilize propofol.  The risks, benefits, limitations, imponderables and alternatives regarding both EGD and colonoscopy have been reviewed with the patient. Questions have been answered. All parties agreeable.    Notice: This dictation was prepared with Dragon dictation along with smaller phrase technology. Any transcriptional errors that result from this process are unintentional and may not be corrected upon review.

## 2018-07-02 NOTE — Patient Instructions (Signed)
Schedule an EGD with esophageal dilation - dysphagia and a surveillance colonoscopy - hx of colon polyps.  Propofol  Further recommendations to follow       Thank you for entrusting me with your care. I appreciate the opportunity to create valuable relationships with patients and family members. You may receive a questionnaire in the mail regarding your visit here today.  It would be appreciated if you would take the time to return it.  If you were not completely satisfied with your experience, I would love to discuss any concerns with you.   Bridgette Habermann, M.D. [facility]

## 2018-07-09 ENCOUNTER — Encounter: Payer: Self-pay | Admitting: Cardiovascular Disease

## 2018-07-09 ENCOUNTER — Ambulatory Visit (INDEPENDENT_AMBULATORY_CARE_PROVIDER_SITE_OTHER): Payer: Medicare Other | Admitting: Cardiovascular Disease

## 2018-07-09 VITALS — BP 138/77 | HR 63 | Ht 69.0 in | Wt 217.2 lb

## 2018-07-09 DIAGNOSIS — R931 Abnormal findings on diagnostic imaging of heart and coronary circulation: Secondary | ICD-10-CM

## 2018-07-09 DIAGNOSIS — I471 Supraventricular tachycardia: Secondary | ICD-10-CM

## 2018-07-09 DIAGNOSIS — I714 Abdominal aortic aneurysm, without rupture, unspecified: Secondary | ICD-10-CM

## 2018-07-09 DIAGNOSIS — I10 Essential (primary) hypertension: Secondary | ICD-10-CM | POA: Diagnosis not present

## 2018-07-09 DIAGNOSIS — Z9889 Other specified postprocedural states: Secondary | ICD-10-CM | POA: Diagnosis not present

## 2018-07-09 DIAGNOSIS — I7789 Other specified disorders of arteries and arterioles: Secondary | ICD-10-CM

## 2018-07-09 NOTE — Patient Instructions (Addendum)
Medication Instructions:  Continue all current medications.  Labwork: none  Testing/Procedures: none  Follow-Up: As needed.    Any Other Special Instructions Will Be Listed Below (If Applicable).  If you need a refill on your cardiac medications before your next appointment, please call your pharmacy.  

## 2018-07-09 NOTE — Progress Notes (Signed)
SUBJECTIVE: The patient presents to establish care in our [place] office.  I saw him most recently in November 2016 while hospitalized.  He has been evaluated by CT surgery most recently on 04/11/2018 for stable mild dilatation of the aortic root with no change over the past 18 months.  He also has bilateral popliteal artery aneurysms followed by vascular surgery. He previously underwent SVT ablation by Dr. Rayann Heman.  Echocardiogram on 08/28/2016 demonstrated normal left ventricular systolic function, LVEF 55 to 60%, hypokinesis of the anteroseptal and apical myocardium, grade 1 diastolic dysfunction, mild aortic regurgitation, and mild aortic root dilation.  The patient denies any symptoms of chest pain, palpitations, shortness of breath, lightheadedness, leg swelling, orthopnea, PND, and syncope.  He enjoys gardening and doing outdoor work and cuts down trees without difficulty.  He was evaluated for abdominal pain and atypical chest pain in the ED on 04/27/2018.  I personally reviewed the ECG which demonstrated normal sinus rhythm with an isolated PVC and nonspecific T wave abnormalities in the lateral leads.  CT of the abdomen showed an ectatic abdominal aorta measuring 2.8 cm with an ultrasound recommended in 5 years.  He denies a personal history of myocardial infarction.  He does have Mnire's disease and has occasional dizziness from this.    Review of Systems: As per "subjective", otherwise negative.  Allergies  Allergen Reactions  . Peanut-Containing Drug Products Hives  . Penicillins Hives    Has patient had a PCN reaction causing immediate rash, facial/tongue/throat swelling, SOB or lightheadedness with hypotension: No Has patient had a PCN reaction causing severe rash involving mucus membranes or skin necrosis: No Has patient had a PCN reaction that required hospitalization No Has patient had a PCN reaction occurring within the last 10 years: No If all of the above answers  are "NO", then may proceed with Cephalosporin use.  . Quinolones     Patient was warned about not using Cipro and similar antibiotics. Recent studies have raised concern that fluoroquinolone antibiotics could be associated with an increased risk of aortic aneurysm Fluoroquinolones have non-antimicrobial properties that might jeopardise the integrity of the extracellular matrix of the vascular wall In a  propensity score matched cohort study in [place], there was a 66% increased rate of aortic aneurysm or dissection associated with oral fluoroquinolone use, compared wit    Current Outpatient Medications  Medication Sig Dispense Refill  . amLODipine (NORVASC) 5 MG tablet TAKE ONE TABLET BY MOUTH DAILY (DUE FOR PHYSICAL WITH CORY) (Patient taking differently: Take 5 mg by mouth daily. TAKE ONE TABLET BY MOUTH DAILY (DUE FOR PHYSICAL WITH CORY)) 90 tablet 1  . cetirizine (ZYRTEC) 10 MG tablet Take 10 mg by mouth as needed.     . Cyanocobalamin (VITAMIN B-12 PO) Take 1 tablet by mouth as needed.     . desonide (DESOWEN) 0.05 % cream Apply 1 application topically as needed.     . dutasteride (AVODART) 0.5 MG capsule Take 1 capsule (0.5 mg total) by mouth daily. (Patient taking differently: Take 0.5 mg by mouth 2 (two) times daily. ) 90 capsule 1  . fluticasone (FLONASE) 50 MCG/ACT nasal spray Place 2 sprays into both nostrils daily as needed for allergies or rhinitis. 16 g 2  . lansoprazole (PREVACID) 30 MG capsule Take 1 capsule (30 mg total) by mouth daily. 90 capsule 3  . tamsulosin (FLOMAX) 0.4 MG CAPS capsule Take 2 capsules (0.8 mg total) by mouth daily. 30 capsule 5  .  vitamin C (ASCORBIC ACID) 500 MG tablet Take 500 mg by mouth daily.    Marland Kitchen aspirin 81 MG chewable tablet Chew 81 mg by mouth daily.     No current facility-administered medications for this visit.     Past Medical History:  Diagnosis Date  . Allergy   . Cataract of both eyes    implants both eyes   . Eczema   . Enlarged  aorta ([place])   . Erectile dysfunction   . Hearing loss    high frequency  . History of inguinal hernia repair, bilateral   . Hypertension   . Kidney stones   . Loss of hearing    high frequency  . Meniere disease   . Obesity   . Paroxysmal SVT (supraventricular tachycardia) ([place])   . Psoriasis   . S/P appendectomy   . S/P tonsillectomy   . Seasonal allergies   . Type II or unspecified type diabetes mellitus without mention of complication, uncontrolled    12-29-14 Told he was prediabetic once    Past Surgical History:  Procedure Laterality Date  . Ablasion    . APPENDECTOMY    . CHOLECYSTECTOMY    . COLONOSCOPY     unsure where or when- states greater than 10 yrs ago per pt- was normal per pt and his wife   . CYSTOSCOPY WITH RETROGRADE PYELOGRAM, URETEROSCOPY AND STENT PLACEMENT Left 12/30/2014   Procedure: CYSTOSCOPY WITH LEFT URETEROSCOPY STONE EXTRACTION WITH STENT;  Surgeon: Franchot Gallo, MD;  Location: [facility];  Service: Urology;  Laterality: Left;  . ELECTROPHYSIOLOGIC STUDY N/A 08/24/2015   Procedure: SVT Ablation;  Surgeon: Thompson Grayer, MD;  Location: [facility];  Service: Cardiovascular;  Laterality: N/A;  . ENDOVENOUS ABLATION SAPHENOUS VEIN W/ LASER Right 12/04/2016   endovenous laser ablation right greater saphenous vein by Tinnie Gens MD   . ESOPHAGOGASTRODUODENOSCOPY N/A 08/18/2016   Procedure: ESOPHAGOGASTRODUODENOSCOPY (EGD);  Surgeon: [name], MD;  Location: [facility];  Service: Endoscopy;  Laterality: N/A;  . ESOPHAGOGASTRODUODENOSCOPY N/A 09/06/2016   Procedure: ESOPHAGOGASTRODUODENOSCOPY (EGD);  Surgeon: [name], MD;  Location: [facility];  Service: Endoscopy;  Laterality: N/A;  100 - moved to 2/14 @ 2:15 per Tretha Sciara  . EYE SURGERY     Catracts removed both eye 30 yrs ago  . HERNIA REPAIR     right and left  . HOLMIUM LASER APPLICATION Left 04/01/3569   Procedure: HOLMIUM LASER APPLICATION;  Surgeon: Franchot Gallo, MD;   Location: [facility];  Service: Urology;  Laterality: Left;  Marland Kitchen MALONEY DILATION N/A 09/06/2016   Procedure: Venia Minks DILATION;  Surgeon: [name], MD;  Location: [facility];  Service: Endoscopy;  Laterality: N/A;  . TONSILLECTOMY      Social History   Socioeconomic History  . Marital status: Married    Spouse name: Not on file  . Number of children: Not on file  . Years of education: Not on file  . Highest education level: Not on file  Occupational History  . Not on file  Social Needs  . Financial resource strain: Not on file  . Food insecurity:    Worry: Not on file    Inability: Not on file  . Transportation needs:    Medical: Not on file    Non-medical: Not on file  Tobacco Use  . Smoking status: Former Smoker    Last attempt to quit: 07/24/1964    Years since quitting: 53.9  . Smokeless  tobacco: Never Used  . Tobacco comment: Quit 20-30 years ago; smoked pipe and cigars   Substance and Sexual Activity  . Alcohol use: Yes    Comment: couple beers/day  . Drug use: No  . Sexual activity: Not on file  Lifestyle  . Physical activity:    Days per week: Not on file    Minutes per session: Not on file  . Stress: Not on file  Relationships  . Social connections:    Talks on phone: Not on file    Gets together: Not on file    Attends religious service: Not on file    Active member of club or organization: Not on file    Attends meetings of clubs or organizations: Not on file    Relationship status: Not on file  . Intimate partner violence:    Fear of current or ex partner: Not on file    Emotionally abused: Not on file    Physically abused: Not on file    Forced sexual activity: Not on file  Other Topics Concern  . Not on file  Social History Narrative   Daily Caffeine use: 5-6 drinks daily   Exercising 2 times per week   No Drug use           Vitals:   07/09/18 0912  BP: 138/77  Pulse: 63  SpO2: 99%  Weight: 217 lb 3.2 oz (98.5 kg)  Height: 5\' 9"   (1.753 m)    Wt Readings from Last 3 Encounters:  07/09/18 217 lb 3.2 oz (98.5 kg)  07/02/18 214 lb 3.2 oz (97.2 kg)  04/27/18 220 lb (99.8 kg)     PHYSICAL EXAM General: NAD HEENT: Normal. Neck: No JVD, no thyromegaly. Lungs: Clear to auscultation bilaterally with normal respiratory effort. CV: Regular rate and rhythm, normal S1/S2, no S3/S4, no murmur. No pretibial or periankle edema.  No carotid bruit.   Abdomen: Soft, nontender, no distention.  Neurologic: Alert and oriented.  Psych: Normal affect. Skin: Normal. Musculoskeletal: No gross deformities.    ECG: Reviewed above under Subjective   Labs: Lab Results  Component Value Date/Time   K 3.6 05/06/2018 02:26 PM   BUN 8 05/06/2018 02:26 PM   CREATININE 1.14 05/06/2018 02:26 PM   CREATININE 0.89 08/17/2015 09:33 AM   ALT 27 04/27/2018 05:24 AM   TSH 1.63 07/27/2017 02:20 PM   HGB 13.5 05/06/2018 02:26 PM     Lipids: Lab Results  Component Value Date/Time   LDLCALC 119 (H) 07/27/2017 02:20 PM   CHOL 189 07/27/2017 02:20 PM   TRIG 151.0 (H) 07/27/2017 02:20 PM   HDL 39.60 07/27/2017 02:20 PM       ASSESSMENT AND PLAN:  1.  SVT: This has resolved after ablation.  2.  Aortic root enlargement: Followed by CT surgery.  3.  Abnormal echocardiogram: He has wall motion abnormalities with anteroseptal and apical hypokinesis by echocardiogram in February 2018.  A stress test was ordered several years ago which he did not obtain.  He is entirely asymptomatic.  He is currently on aspirin.  I do not feel he requires any testing at this time.  4.  Hypertension: Blood pressure is normal.  No changes to therapy.  Graph 5.  Ectatic abdominal aorta/peripheral vascular disease: Followed by vascular surgery.   Disposition: Follow up with me as needed  Kate Sable, M.D., F.A.C.C.

## 2018-07-10 ENCOUNTER — Encounter: Payer: Self-pay | Admitting: Family Medicine

## 2018-07-10 ENCOUNTER — Ambulatory Visit (INDEPENDENT_AMBULATORY_CARE_PROVIDER_SITE_OTHER): Payer: Medicare Other | Admitting: Family Medicine

## 2018-07-10 VITALS — BP 130/76 | HR 64 | Temp 98.2°F | Wt 216.0 lb

## 2018-07-10 DIAGNOSIS — R0982 Postnasal drip: Secondary | ICD-10-CM

## 2018-07-10 NOTE — Patient Instructions (Addendum)
You can use Afrin nasal spray which is available over-the-counter, however do not use this nose spray longer than 3 days as you can develop dependence.  You can also use a humidifier at home to help provide moisture in the air so that nasal membranes are not as dry.  You can use another allergy medicine such as Allegra, Claritin, Xyzal to see if this helps with your nasal drainage.  Postnasal Drip Postnasal drip is the feeling of mucus going down the back of your throat. Mucus is a slimy substance that moistens and cleans your nose and throat, as well as the air pockets in face bones near your forehead and cheeks (sinuses). Small amounts of mucus pass from your nose and sinuses down the back of your throat all the time. This is normal. When you produce too much mucus or the mucus gets too thick, you can feel it. Some common causes of postnasal drip include:  Having more mucus because of: ? A cold or the flu. ? Allergies. ? Cold air. ? Certain medicines.  Having more mucus that is thicker because of: ? A sinus or nasal infection. ? Dry air. ? A food allergy. Follow these instructions at home: Relieving discomfort   Gargle with a salt-water mixture 3-4 times a day or as needed. To make a salt-water mixture, completely dissolve -1 tsp of salt in 1 cup of warm water.  If the air in your home is dry, use a humidifier to add moisture to the air.  Use a saline spray or container (neti pot) to flush out the nose (nasal irrigation). These methods can help clear away mucus and keep the nasal passages moist. General instructions  Take over-the-counter and prescription medicines only as told by your health care provider.  Follow instructions from your health care provider about eating or drinking restrictions. You may need to avoid caffeine.  Avoid things that you know you are allergic to (allergens), like dust, mold, pollen, pets, or certain foods.  Drink enough fluid to keep your urine pale  yellow.  Keep all follow-up visits as told by your health care provider. This is important. Contact a health care provider if:  You have a fever.  You have a sore throat.  You have difficulty swallowing.  You have headache.  You have sinus pain.  You have a cough that does not go away.  The mucus from your nose becomes thick and is green or yellow in color.  You have cold or flu symptoms that last more than 10 days. Summary  Postnasal drip is the feeling of mucus going down the back of your throat.  If your health care provider approves, use nasal irrigation or a nasal spray 2?4 times a day.  Avoid things that you know you are allergic to (allergens), like dust, mold, pollen, pets, or certain foods. This information is not intended to replace advice given to you by your health care provider. Make sure you discuss any questions you have with your health care provider. Document Released: 10/23/2016 Document Revised: 10/23/2016 Document Reviewed: 10/23/2016 Elsevier Interactive Patient Education  2019 [place], Adult A nosebleed is when blood comes out of the nose. Nosebleeds are common. Usually, they are not a sign of a serious condition. Nosebleeds can happen if a small blood vessel in your nose starts to bleed or if the lining of your nose (mucous membrane) cracks. They are commonly caused by:  Allergies.  Colds.  Picking your nose.  Blowing your nose too hard.  An injury from sticking an object into your nose or getting hit in the nose.  Dry or cold air. Less common causes of nosebleeds include:  Toxic fumes.  Something abnormal in the nose or in the air-filled spaces in the bones of the face (sinuses).  Growths in the nose, such as polyps.  Medicines or conditions that cause blood to clot slowly.  Certain illnesses or procedures that irritate or dry out the nasal passages. Follow these instructions at home: When you have a  nosebleed:   Sit down and tilt your head slightly forward.  Use a clean towel or tissue to pinch your nostrils under the bony part of your nose. After 10 minutes, let go of your nose and see if bleeding starts again. Do not release pressure before that time. If there is still bleeding, repeat the pinching and holding for 10 minutes until the bleeding stops.  Do not place tissues or gauze in the nose to stop bleeding.  Avoid lying down and avoid tilting your head backward. That may make blood collect in the throat and cause gagging or coughing.  Use a nasal spray decongestant to help with a nosebleed as told by your health care provider.  Do not use petroleum jelly or mineral oil in your nose. It can drip into your lungs. After a nosebleed:  Avoid blowing your nose or sniffing for a number of hours.  Avoid straining, lifting, or bending at the waist for several days. You may resume other normal activities as you are able.  Use saline spray or a humidifier as told by your health care provider.  Aspirinand blood thinners make bleeding more likely. If you are prescribed these medicines and you suffer from nosebleeds: ? Ask your health care provider if you should stop taking the medicines or if you should adjust the dose. ? Do not stop taking medicines that your health care provider has recommended unless told by your health care provider.  If your nosebleed was caused by dry mucous membranes, use over-the-counter saline nasal spray or gel. This will keep the mucous membranes moist and allow them to heal. If you must use a lubricant: ? Choose one that is water-soluble. ? Use only as much as you need and use it only as often as needed. ? Do not lie down until several hours after you use it. Contact a health care provider if:  You have a fever.  You get nosebleeds often or more often than usual.  You bruise very easily.  You have a nosebleed from having something stuck in your  nose.  You have bleeding in your mouth.  You vomit or cough up brown material.  You have a nosebleed after you start a new medicine. Get help right away if:  You have a nosebleed after a fall or a head injury.  Your nosebleed does not go away after 20 minutes.  You feel dizzy or weak.  You have unusual bleeding from other parts of your body.  You have unusual bruising on other parts of your body.  You become sweaty.  You vomit blood. This information is not intended to replace advice given to you by your health care provider. Make sure you discuss any questions you have with your health care provider. Document Released: 04/19/2005 Document Revised: 11/14/2016 Document Reviewed: 01/25/2016 Elsevier Interactive Patient Education  2019 [employer].

## 2018-07-10 NOTE — Progress Notes (Signed)
Subjective:    Patient ID: Daniel Guerrero, male    DOB: 03-21-1946, 72 y.o.   MRN: [id number]  No chief complaint on file.   HPI Patient was seen today for acute concern.  Pt endorses increased postnasal drainage.  This am pt had bloody nasal discharge when blowing his nose.  Pt also notes his voice has been a little raspy over the last few days.  Taking Flonase and Zyrtec.  Pt denies fever, ear pain or pressure, headache, facial pain or pressure, sore throat, cough.  Pt unsure of sick contacts.  Pt concerned that his nose will bleed over the Christmas break and he will end up in the emergency room which has happened in the past.  Past Medical History:  Diagnosis Date  . Allergy   . Cataract of both eyes    implants both eyes   . Eczema   . Enlarged aorta ([place])   . Erectile dysfunction   . Hearing loss    high frequency  . History of inguinal hernia repair, bilateral   . Hypertension   . Kidney stones   . Loss of hearing    high frequency  . Meniere disease   . Obesity   . Paroxysmal SVT (supraventricular tachycardia) ([place])   . Psoriasis   . S/P appendectomy   . S/P tonsillectomy   . Seasonal allergies   . Type II or unspecified type diabetes mellitus without mention of complication, uncontrolled    12-29-14 Told he was prediabetic once    Allergies  Allergen Reactions  . Peanut-Containing Drug Products Hives  . Penicillins Hives    Has patient had a PCN reaction causing immediate rash, facial/tongue/throat swelling, SOB or lightheadedness with hypotension: No Has patient had a PCN reaction causing severe rash involving mucus membranes or skin necrosis: No Has patient had a PCN reaction that required hospitalization No Has patient had a PCN reaction occurring within the last 10 years: No If all of the above answers are "NO", then may proceed with Cephalosporin use.  . Quinolones     Patient was warned about not using Cipro and similar antibiotics. Recent studies have  raised concern that fluoroquinolone antibiotics could be associated with an increased risk of aortic aneurysm Fluoroquinolones have non-antimicrobial properties that might jeopardise the integrity of the extracellular matrix of the vascular wall In a  propensity score matched cohort study in [place], there was a 66% increased rate of aortic aneurysm or dissection associated with oral fluoroquinolone use, compared wit    ROS General: Denies fever, chills, night sweats, changes in weight, changes in appetite HEENT: Denies headaches, ear pain, changes in vision, rhinorrhea, sore throat  + nasal congestion, blood-tinged drainage from nose CV: Denies CP, palpitations, SOB, orthopnea Pulm: Denies SOB, cough, wheezing GI: Denies abdominal pain, nausea, vomiting, diarrhea, constipation GU: Denies dysuria, hematuria, frequency, vaginal discharge Msk: Denies muscle cramps, joint pains Neuro: Denies weakness, numbness, tingling Skin: Denies rashes, bruising Psych: Denies depression, anxiety, hallucinations    Objective:    Blood pressure 130/76, pulse 64, temperature 98.2 F (36.8 C), temperature source Oral, weight 216 lb (98 kg), SpO2 96 %.  Gen. Pleasant, well-nourished, in no distress, normal affect  HEENT: Wearing hearing aids bilaterally.  [place]/AT, face symmetric, no scleral icterus, PERRLA, nares patent with drainage and clot visible in left naris along septum. pharynx with postnasal drainage, no erythema or exudate.  TMs full bilaterally.  No cervical lymphadenopathy. Lungs: no accessory muscle use, CTAB, no wheezes or  rales Cardiovascular: RRR, no m/r/g, no peripheral edema Neuro:  A&Ox3, CN II-XII intact, normal gait  Wt Readings from Last 3 Encounters:  07/10/18 216 lb (98 kg)  07/09/18 217 lb 3.2 oz (98.5 kg)  07/02/18 214 lb 3.2 oz (97.2 kg)    Lab Results  Component Value Date   WBC 6.9 05/06/2018   HGB 13.5 05/06/2018   HCT 40.9 05/06/2018   PLT 332 05/06/2018   GLUCOSE 107  (H) 05/06/2018   CHOL 189 07/27/2017   TRIG 151.0 (H) 07/27/2017   HDL 39.60 07/27/2017   LDLCALC 119 (H) 07/27/2017   ALT 27 04/27/2018   AST 59 (H) 04/27/2018   NA 137 05/06/2018   K 3.6 05/06/2018   CL 105 05/06/2018   CREATININE 1.14 05/06/2018   BUN 8 05/06/2018   CO2 23 05/06/2018   TSH 1.63 07/27/2017   PSA 3.59 07/27/2017   HGBA1C 5.5 04/14/2016    Assessment/Plan:  Postnasal drip  -Discussed switching from Zyrtec to another allergy medication.  Patient will try Allegra, Claritin, Xyzal OTC. -Given concern for epistaxis and recent bloody drainage advised to use a humidifier at night. -Patient can also try Afrin nasal spray for no more than 3 days. -Given handout -Follow-up PRN  Grier Mitts, MD

## 2018-07-11 DIAGNOSIS — R351 Nocturia: Secondary | ICD-10-CM | POA: Diagnosis not present

## 2018-07-11 DIAGNOSIS — N401 Enlarged prostate with lower urinary tract symptoms: Secondary | ICD-10-CM | POA: Diagnosis not present

## 2018-07-30 ENCOUNTER — Ambulatory Visit: Payer: Medicare Other

## 2018-08-06 NOTE — Progress Notes (Signed)
Subjective:   Daniel Guerrero is a 73 y.o. male who presents for Medicare Annual/Subsequent preventive examination.  Review of Systems:  No ROS.  Medicare Wellness Visit. Additional risk factors are reflected in the social history.  Cardiac Risk Factors include: advanced age (>21men, >53 women);hypertension;male gender;sedentary lifestyle;Other (see comment), Risk factor comments: enlarged aorta and peripheral vascular issues, being followed by cardiology  Sleep patterns: has interrupted sleep and gets up 2 times nightly to void. States gas pains are largely to blame for getting up so frequently. Positional changes, abdominal massage, continued use of gas-x, and good hydration reviewed with patient.   Home Safety/Smoke Alarms: Feels safe in home. Smoke alarms in place.  Living environment; residence and Firearm Safety: 2-story house. States they are thinking about downsizing and/or converting living arrangements to make one-level living.  Seat Belt Safety/Bike Helmet: Wears seat belt.    Male:   CCS- colonoscopy/endoscopy scheduled for 08/26/2018    PSA-  Lab Results  Component Value Date   PSA 3.59 07/27/2017   PSA 3.14 06/22/2017   PSA 2.43 11/14/2013       Objective:    Vitals: BP (!) 146/78 (BP Location: Left Arm, Patient Position: Sitting, Cuff Size: Normal)   Pulse 62   Temp 98.2 F (36.8 C)   Resp 16   Ht 5\' 9"  (1.753 m)   Wt 219 lb (99.3 kg)   SpO2 96%   BMI 32.34 kg/m   Body mass index is 32.34 kg/m.  Advanced Directives 08/07/2018 04/27/2018 07/27/2017 12/04/2016 09/06/2016 08/18/2016 07/18/2016  Does Patient Have a Medical Advance Directive? Yes Yes Yes No Yes No Yes  Type of Paramedic of [place];Living will [place];Living will - - [place];Living will - [place];Living will  Does patient want to make changes to medical advance directive? No - Patient declined No - Patient  declined - - - - -  Copy of [place] in Chart? No - copy requested No - copy requested - - No - copy requested - No - copy requested  Would patient like information on creating a medical advance directive? - No - Patient declined - - No - Patient declined No - Patient declined -    Tobacco Social History   Tobacco Use  Smoking Status Former Smoker  . Last attempt to quit: 07/24/1964  . Years since quitting: 54.0  Smokeless Tobacco Never Used  Tobacco Comment   Quit 20-30 years ago; smoked pipe and cigars      Counseling given: Not Answered Comment: Quit 20-30 years ago; smoked pipe and cigars    Past Medical History:  Diagnosis Date  . Allergy   . Cataract of both eyes    implants both eyes   . Eczema   . Enlarged aorta ([place])   . Erectile dysfunction   . Hearing loss    high frequency  . History of inguinal hernia repair, bilateral   . Hypertension   . Kidney stones   . Loss of hearing    high frequency  . Meniere disease   . Obesity   . Paroxysmal SVT (supraventricular tachycardia) ([place])   . Psoriasis   . S/P appendectomy   . S/P tonsillectomy   . Seasonal allergies   . Type II or unspecified type diabetes mellitus without mention of complication, uncontrolled    12-29-14 Told he was prediabetic once   Past Surgical History:  Procedure Laterality Date  .  Ablasion    . APPENDECTOMY    . CHOLECYSTECTOMY    . COLONOSCOPY     unsure where or when- states greater than 10 yrs ago per pt- was normal per pt and his wife   . CYSTOSCOPY WITH RETROGRADE PYELOGRAM, URETEROSCOPY AND STENT PLACEMENT Left 12/30/2014   Procedure: CYSTOSCOPY WITH LEFT URETEROSCOPY STONE EXTRACTION WITH STENT;  Surgeon: Franchot Gallo, MD;  Location: [facility];  Service: Urology;  Laterality: Left;  . ELECTROPHYSIOLOGIC STUDY N/A 08/24/2015   Procedure: SVT Ablation;  Surgeon: Thompson Grayer, MD;  Location: [facility];  Service: Cardiovascular;  Laterality: N/A;  . ENDOVENOUS  ABLATION SAPHENOUS VEIN W/ LASER Right 12/04/2016   endovenous laser ablation right greater saphenous vein by Tinnie Gens MD   . ESOPHAGOGASTRODUODENOSCOPY N/A 08/18/2016   Procedure: ESOPHAGOGASTRODUODENOSCOPY (EGD);  Surgeon: [name], MD;  [facility];  Service: Endoscopy;  Laterality: N/A;  . ESOPHAGOGASTRODUODENOSCOPY N/A 09/06/2016   Procedure: ESOPHAGOGASTRODUODENOSCOPY (EGD);  Surgeon: [name], MD;  [facility];  Service: Endoscopy;  Laterality: N/A;  100 - moved to 2/14 @ 2:15 per Tretha Sciara  . EYE SURGERY     Catracts removed both eye 30 yrs ago  . HERNIA REPAIR     right and left  . HOLMIUM LASER APPLICATION Left 10/28/8293   Procedure: HOLMIUM LASER APPLICATION;  Surgeon: Franchot Gallo, MD;  Location: [facility];  Service: Urology;  Laterality: Left;  Marland Kitchen MALONEY DILATION N/A 09/06/2016   Procedure: Venia Minks DILATION;  Surgeon: [name], MD;  [facility];  Service: Endoscopy;  Laterality: N/A;  . TONSILLECTOMY     Family History  Problem Relation Age of Onset  . Cancer Mother        lung  . Thyroid disease Mother   . Diabetes Father   . GI Bleed Father   . Aneurysm Father   . AAA (abdominal aortic aneurysm) Father   . Colon cancer Neg Hx   . Colon polyps Neg Hx   . Rectal cancer Neg Hx   . Stomach cancer Neg Hx    Social History   Socioeconomic History  . Marital status: Married    Spouse name: Not on file  . Number of children: 3  . Years of education: Not on file  . Highest education level: Not on file  Occupational History  . Occupation: professor  Social Needs  . Financial resource strain: Not hard at all  . Food insecurity:    Worry: Never true    Inability: Never true  . Transportation needs:    Medical: No    Non-medical: No  Tobacco Use  . Smoking status: Former Smoker    Last attempt to quit: 07/24/1964    Years since quitting: 54.0  . Smokeless tobacco: Never Used  . Tobacco comment: Quit 20-30 years  ago; smoked pipe and cigars   Substance and Sexual Activity  . Alcohol use: Yes    Alcohol/week: 7.0 standard drinks    Types: 2 Cans of beer, 4 Shots of liquor, 1 Glasses of wine per week    Comment: couple beers/day  . Drug use: No  . Sexual activity: Not on file  Lifestyle  . Physical activity:    Days per week: 0 days    Minutes per session: 0 min  . Stress: Not at all  Relationships  . Social connections:    Talks on phone: Twice a week    Gets together: Once  a week    Attends religious service: Never    Active member of club or organization: Yes    Attends meetings of clubs or organizations: More than 4 times per year    Relationship status: Married  Other Topics Concern  . Not on file  Social History Narrative   Daily Caffeine use: 5-6 drinks daily      08/07/2018: No drug use, some daily alcohol use at night but patient does not feel like it disrupts his health or is a safety concern.       Lives with wife in two story home; three sons, five grandchildren in [place] area.   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              Outpatient Encounter Medications as of 08/07/2018  Medication Sig  . amLODipine (NORVASC) 5 MG tablet TAKE ONE TABLET BY MOUTH DAILY (DUE FOR PHYSICAL WITH CORY) (Patient taking differently: Take 5 mg by mouth daily. TAKE ONE TABLET BY MOUTH DAILY (DUE FOR PHYSICAL WITH CORY))  . cetirizine (ZYRTEC) 10 MG tablet Take 10 mg by mouth as needed.   . Cyanocobalamin (VITAMIN B-12 PO) Take 1 tablet by mouth as needed.   . desonide (DESOWEN) 0.05 % cream Apply 1 application topically as needed.   . fluticasone (FLONASE) 50 MCG/ACT nasal spray Place 2 sprays into both nostrils daily as needed for allergies or rhinitis.  Marland Kitchen lansoprazole (PREVACID) 30 MG capsule Take 1 capsule (30 mg total) by mouth daily.  . Simethicone (GAS-X PO) Take by mouth daily as needed.  . tamsulosin (FLOMAX) 0.4  MG CAPS capsule Take 2 capsules (0.8 mg total) by mouth daily.  . vitamin C (ASCORBIC ACID) 500 MG tablet Take 500 mg by mouth daily.  Marland Kitchen aspirin 81 MG chewable tablet Chew 81 mg by mouth daily.   No facility-administered encounter medications on file as of 08/07/2018.     Activities of Daily Living In your present state of health, do you have any difficulty performing the following activities: 08/07/2018  Hearing? N  Vision? Y  Difficulty concentrating or making decisions? Y  Comment pt. has endorsed increased difficulty thinking clearly  Walking or climbing stairs? N  Dressing or bathing? N  Doing errands, shopping? N  Preparing Food and eating ? N  Using the Toilet? N  In the past six months, have you accidently leaked urine? N  Do you have problems with loss of bowel control? N  Managing your Medications? N  Managing your Finances? N  Housekeeping or managing your Housekeeping? N  Some recent data might be hidden    Patient Care Team: Dorothyann Peng, NP as PCP - General (Family Medicine) Herminio Commons, MD as PCP - Cardiology (Cardiology) Grace Isaac, MD as Consulting Physician (Cardiothoracic Surgery) Thompson Grayer, MD as Consulting Physician (Cardiology) Herminio Commons, MD as Attending Physician (Cardiology) Susa Day, MD as Consulting Physician (Orthopedic Surgery) Lucas Mallow, MD as Consulting Physician (Urology) Jola Schmidt, MD as Consulting Physician (Ophthalmology)   Assessment:   This is a routine wellness examination for Kellen. Physical assessment deferred to PCP.   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  Eating more asian cuisine recently s/p gall bladder removal. Education on diet after gall bladder surgery provided to patient in writing.     Goals    . Patient Stated     To start walking this year Walks outdoors, [facility]; try to increase up to 30 minutes 5 days a week  Get a fit bit     . Patient Stated     Keep doing what I love-teach!        Fall Risk Fall Risk  08/07/2018 07/27/2017 10/19/2015 12/18/2013  Falls in the past year? 0 Yes No No  Number falls in past yr: - 1 - -  Injury with Fall? - No - -  Comment - had meinere's  - -  Follow up Falls prevention discussed;Education provided Education provided - -    Depression Screen PHQ 2/9 Scores 08/07/2018 07/27/2017 10/19/2015 12/18/2013  PHQ - 2 Score 1 0 0 0  PHQ- 9 Score 2 - - -    Cognitive Function MMSE - Mini Mental State Exam 07/27/2017  Not completed: (No Data)        Immunization History  Administered Date(s) Administered  . Influenza, High Dose Seasonal PF 04/14/2016  . Influenza,inj,Quad PF,6+ Mos 04/16/2014  . Influenza-Unspecified 05/25/2015, 05/24/2017  . Pneumococcal Conjugate-13 04/14/2016  . Pneumococcal Polysaccharide-23 04/23/2008, 12/18/2013  . Td 12/13/2007    Qualifies for Shingles Vaccine? Encouraged to ask local pharmacy about receiving shingrix series.   Screening Tests Health Maintenance  Topic Date Due  . Hepatitis C Screening  21-Mar-1946  . URINE MICROALBUMIN  12/12/1955  . TETANUS/TDAP  12/12/2017  . HEMOGLOBIN A1C  01/21/2018  . OPHTHALMOLOGY EXAM  09/20/2018 (Originally 08/11/2017)  . COLONOSCOPY  05/19/2019  . FOOT EXAM  08/08/2019  . INFLUENZA VACCINE  Completed  . PNA vac Low Risk Adult  Completed        Plan:    Continue taking your amlodipine daily as prescribed for high blood pressure management/stroke prevention.   Continue taking the gas-x to relieve gas pains since gall bladder removal surgery. Keep in mind staying physically  active, staying hydrated, and sleeping on L side may help relieve gas pains and help you sleep better as well.   Diet education after gall bladder removal provided.  I will follow up with your PCP about Hep C screening.  Bring a copy of your living will and/or healthcare power of attorney to your next office visit.  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 :)  I have personally reviewed and noted the following in the patient's chart:   . Medical and social history . Use of alcohol, tobacco or illicit drugs  . Current medications and supplements . Functional ability and status . Nutritional status . Physical activity . Advanced directives . List of other physicians . Vitals . Screenings to include cognitive, depression, and falls . Referrals and appointments  In addition, I have reviewed and discussed with patient certain preventive protocols, quality metrics, and best practice recommendations. A written personalized care plan for preventive services as well as general preventive health recommendations were provided to patient.     Alphia Moh, RN  08/07/2018

## 2018-08-07 ENCOUNTER — Ambulatory Visit (INDEPENDENT_AMBULATORY_CARE_PROVIDER_SITE_OTHER): Payer: Medicare Other

## 2018-08-07 ENCOUNTER — Telehealth: Payer: Self-pay

## 2018-08-07 VITALS — BP 146/78 | HR 62 | Temp 98.2°F | Resp 16 | Ht 69.0 in | Wt 219.0 lb

## 2018-08-07 DIAGNOSIS — Z0000 Encounter for general adult medical examination without abnormal findings: Secondary | ICD-10-CM

## 2018-08-07 NOTE — Patient Instructions (Addendum)
Continue taking your amlodipine daily as prescribed for high blood pressure management/stroke prevention.   Continue taking the gas-x to relieve gas pains since gall bladder removal surgery. Keep in mind staying physically active, staying hydrated, and sleeping on L side may help relieve gas pains and help you sleep better as well.   Diet education after gall bladder removal provided.  I will follow up with your PCP about Hep C screening.  Bring a copy of your living will and/or healthcare power of attorney to your next office visit.  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 :)    Daniel Guerrero , Thank you for taking time to come for your Medicare Wellness Visit. I appreciate your ongoing commitment to your health goals. Please review the following plan we discussed and let me know if I can assist you in the future.   These are the goals we discussed: Goals    . Patient Stated     To start walking this year Walks outdoors, [facility]; try to increase up to 30 minutes 5 days a week  Get a fit bit     . Patient Stated     Keep doing what I love-teach!        This is a list of the screening recommended for you and due dates:  Health Maintenance  Topic Date Due  .  Hepatitis C: One time screening is recommended by Center for Disease Control  (CDC) for  adults born from [birth year] through 1965.   01/27/[birth year]  . Complete foot exam   12/12/71  . Urine Protein Check  12/12/71  . Eye exam for diabetics  08/11/2017  . Tetanus Vaccine  12/13/71  . Hemoglobin A1C  01/22/72  . Colon Cancer Screening  05/18/72  . Flu Shot  Completed  . Pneumonia vaccines  Completed     Fall Prevention in the Home, Adult Falls can cause injuries. They can happen to people of all ages. There are many things you can do to make your home safe and to help prevent falls. Ask for help when making these changes, if needed. What actions can I take to prevent  falls? General Instructions  Use good lighting in all rooms. Replace any light bulbs that burn out.  Turn on the lights when you go into a dark area. Use night-lights.  Keep items that you use often in easy-to-reach places. Lower the shelves around your home if necessary.  Set up your furniture so you have a clear path. Avoid moving your furniture around.  Do not have throw rugs and other things on the floor that can make you trip.  Avoid walking on wet floors.  If any of your floors are uneven, fix them.  Add color or contrast paint or tape to clearly mark and help you see: ? Any grab bars or handrails. ? First and last steps of stairways. ? Where the edge of each step is.  If you use a stepladder: ? Make sure that it is fully opened. Do not climb a closed stepladder. ? Make sure that both sides of the stepladder are locked into place. ? Ask someone to hold the stepladder for you while you use it.  If there are any pets around you, be aware of where they are. What can I do in the bathroom?      Keep the floor dry. Clean up any water that spills onto the floor  as soon as it happens.  Remove soap buildup in the tub or shower regularly.  Use non-skid mats or decals on the floor of the tub or shower.  Attach bath mats securely with double-sided, non-slip rug tape.  If you need to sit down in the shower, use a plastic, non-slip stool.  Install grab bars by the toilet and in the tub and shower. Do not use towel bars as grab bars. What can I do in the bedroom?  Make sure that you have a light by your bed that is easy to reach.  Do not use any sheets or blankets that are too big for your bed. They should not hang down onto the floor.  Have a firm chair that has side arms. You can use this for support while you get dressed. What can I do in the kitchen?  Clean up any spills right away.  If you need to reach something above you, use a strong step stool that has a grab  bar.  Keep electrical cords out of the way.  Do not use floor polish or wax that makes floors slippery. If you must use wax, use non-skid floor wax. What can I do with my stairs?  Do not leave any items on the stairs.  Make sure that you have a light switch at the top of the stairs and the bottom of the stairs. If you do not have them, ask someone to add them for you.  Make sure that there are handrails on both sides of the stairs, and use them. Fix handrails that are broken or loose. Make sure that handrails are as long as the stairways.  Install non-slip stair treads on all stairs in your home.  Avoid having throw rugs at the top or bottom of the stairs. If you do have throw rugs, attach them to the floor with carpet tape.  Choose a carpet that does not hide the edge of the steps on the stairway.  Check any carpeting to make sure that it is firmly attached to the stairs. Fix any carpet that is loose or worn. What can I do on the outside of my home?  Use bright outdoor lighting.  Regularly fix the edges of walkways and driveways and fix any cracks.  Remove anything that might make you trip as you walk through a door, such as a raised step or threshold.  Trim any bushes or trees on the path to your home.  Regularly check to see if handrails are loose or broken. Make sure that both sides of any steps have handrails.  Install guardrails along the edges of any raised decks and porches.  Clear walking paths of anything that might make someone trip, such as tools or rocks.  Have any leaves, snow, or ice cleared regularly.  Use sand or salt on walking paths during winter.  Clean up any spills in your garage right away. This includes grease or oil spills. What other actions can I take?  Wear shoes that: ? Have a low heel. Do not wear high heels. ? Have rubber bottoms. ? Are comfortable and fit you well. ? Are closed at the toe. Do not wear open-toe sandals.  Use tools that  help you move around (mobility aids) if they are needed. These include: ? Canes. ? Walkers. ? Scooters. ? Crutches.  Review your medicines with your doctor. Some medicines can make you feel dizzy. This can increase your chance of falling. Ask your doctor what  other things you can do to help prevent falls. Where to find more information  Centers for Disease Control and Prevention, STEADI: https://garcia.biz/  [employer] on Aging: BrainJudge.co.uk Contact a doctor if:  You are afraid of falling at home.  You feel weak, drowsy, or dizzy at home.  You fall at home. Summary  There are many simple things that you can do to make your home safe and to help prevent falls.  Ways to make your home safe include removing tripping hazards and installing grab bars in the bathroom.  Ask for help when making these changes in your home. This information is not intended to replace advice given to you by your health care provider. Make sure you discuss any questions you have with your health care provider. Document Released: 05/06/2009 Document Revised: 02/22/2017 Document Reviewed: 02/22/2017 Elsevier Interactive Patient Education  2019 [place] Maintenance, Male A healthy lifestyle and preventive care is important for your health and wellness. Ask your health care provider about what schedule of regular examinations is right for you. What should I know about weight and diet? Eat a Healthy Diet  Eat plenty of vegetables, fruits, whole grains, low-fat dairy products, and lean protein.  Do not eat a lot of foods high in solid fats, added sugars, or salt.  Maintain a Healthy Weight Regular exercise can help you achieve or maintain a healthy weight. You should:  Do at least 150 minutes of exercise each week. The exercise should increase your heart rate and make you sweat (moderate-intensity exercise).  Do strength-training exercises at least twice a week. Watch  Your Levels of Cholesterol and Blood Lipids  Have your blood tested for lipids and cholesterol every 5 years starting at 73 years of age. If you are at high risk for heart disease, you should start having your blood tested when you are 73 years old. You may need to have your cholesterol levels checked more often if: ? Your lipid or cholesterol levels are high. ? You are older than 73 years of age. ? You are at high risk for heart disease. What should I know about cancer screening? Many types of cancers can be detected early and may often be prevented. Lung Cancer  You should be screened every year for lung cancer if: ? You are a current smoker who has smoked for at least 30 years. ? You are a former smoker who has quit within the past 15 years.  Talk to your health care provider about your screening options, when you should start screening, and how often you should be screened. Colorectal Cancer  Routine colorectal cancer screening usually begins at 73 years of age and should be repeated every 5-10 years until you are 73 years old. You may need to be screened more often if early forms of precancerous polyps or small growths are found. Your health care provider may recommend screening at an earlier age if you have risk factors for colon cancer.  Your health care provider may recommend using home test kits to check for hidden blood in the stool.  A small camera at the end of a tube can be used to examine your colon (sigmoidoscopy or colonoscopy). This checks for the earliest forms of colorectal cancer. Prostate and Testicular Cancer  Depending on your age and overall health, your health care provider may do certain tests to screen for prostate and testicular cancer.  Talk to your health care provider about any symptoms or concerns you  have about testicular or prostate cancer. Skin Cancer  Check your skin from head to toe regularly.  Tell your health care provider about any new moles or  changes in moles, especially if: ? There is a change in a mole's size, shape, or color. ? You have a mole that is larger than a pencil eraser.  Always use sunscreen. Apply sunscreen liberally and repeat throughout the day.  Protect yourself by wearing long sleeves, pants, a wide-brimmed hat, and sunglasses when outside. What should I know about heart disease, diabetes, and high blood pressure?  If you are 50-94 years of age, have your blood pressure checked every 3-5 years. If you are 71 years of age or older, have your blood pressure checked every year. You should have your blood pressure measured twice-once when you are at a hospital or clinic, and once when you are not at a hospital or clinic. Record the average of the two measurements. To check your blood pressure when you are not at a hospital or clinic, you can use: ? An automated blood pressure machine at a pharmacy. ? A home blood pressure monitor.  Talk to your health care provider about your target blood pressure.  If you are between 80-64 years old, ask your health care provider if you should take aspirin to prevent heart disease.  Have regular diabetes screenings by checking your fasting blood sugar level. ? If you are at a normal weight and have a low risk for diabetes, have this test once every three years after the age of 56. ? If you are overweight and have a high risk for diabetes, consider being tested at a younger age or more often.  A one-time screening for abdominal aortic aneurysm (AAA) by ultrasound is recommended for men aged 64-75 years who are current or former smokers. What should I know about preventing infection? Hepatitis B If you have a higher risk for hepatitis B, you should be screened for this virus. Talk with your health care provider to find out if you are at risk for hepatitis B infection. Hepatitis C Blood testing is recommended for:  Everyone born from [birth year] through 1965.  Anyone with known risk  factors for hepatitis C. Sexually Transmitted Diseases (STDs)  You should be screened each year for STDs including gonorrhea and chlamydia if: ? You are sexually active and are younger than 73 years of age. ? You are older than 73 years of age and your health care provider tells you that you are at risk for this type of infection. ? Your sexual activity has changed since you were last screened and you are at an increased risk for chlamydia or gonorrhea. Ask your health care provider if you are at risk.  Talk with your health care provider about whether you are at high risk of being infected with HIV. Your health care provider may recommend a prescription medicine to help prevent HIV infection. What else can I do?  Schedule regular health, dental, and eye exams.  Stay current with your vaccines (immunizations).  Do not use any tobacco products, such as cigarettes, chewing tobacco, and e-cigarettes. If you need help quitting, ask your health care provider.  Limit alcohol intake to no more than 2 drinks per day. One drink equals 12 ounces of beer, 5 ounces of wine, or 1 ounces of hard liquor.  Do not use street drugs.  Do not share needles.  Ask your health care provider for help if you need support or  information about quitting drugs.  Tell your health care provider if you often feel depressed.  Tell your health care provider if you have ever been abused or do not feel safe at home. This information is not intended to replace advice given to you by your health care provider. Make sure you discuss any questions you have with your health care provider. Document Released: 01/06/2008 Document Revised: 03/08/2016 Document Reviewed: 04/13/2015 Elsevier Interactive Patient Education  2019 [employer].

## 2018-08-07 NOTE — Progress Notes (Signed)
Medical Screening examination was performed by a non-physician practitioner and as supervising physician I was immediatly  Available for consultation/collaboration. I agree with above.   Dorothyann Peng, NP

## 2018-08-07 NOTE — Telephone Encounter (Signed)
Daniel Guerrero,   The order is in, all he has to do is call and make an appointment for a lab visit

## 2018-08-07 NOTE — Telephone Encounter (Signed)
During AWV, pt. expressed concern over need for Hep C screening. Revealed moderate use of alcohol use daily, recently had gall bladder removed 04/2018. Routed to PCP to advise on recommended labs/next steps.

## 2018-08-08 ENCOUNTER — Encounter: Payer: Self-pay | Admitting: Family Medicine

## 2018-08-08 NOTE — Telephone Encounter (Signed)
Author phoned pt. to update on existing Hep C order and offered to make lab appointment. No answer, author left detailed VM requesting pt. To call back to schedule.

## 2018-08-14 ENCOUNTER — Telehealth: Payer: Self-pay

## 2018-08-14 NOTE — Telephone Encounter (Signed)
Immunization records requested via fax from [facility]. Per pt. Approval during AWV. Awaiting records.

## 2018-08-15 DIAGNOSIS — Z961 Presence of intraocular lens: Secondary | ICD-10-CM | POA: Diagnosis not present

## 2018-08-15 DIAGNOSIS — H43813 Vitreous degeneration, bilateral: Secondary | ICD-10-CM | POA: Diagnosis not present

## 2018-08-15 NOTE — Patient Instructions (Signed)
Daniel Guerrero  08/15/2018     @PREFPERIOPPHARMACY @   Your procedure is scheduled on  08/26/2018.  Report to [facility] at  830  A.M.  Call this number if you have problems the morning of surgery:  [phone]   Remember:  Follow the diet and prep instructions given to you by Dr Roseanne Kaufman office.                     Take these medicines the morning of surgery with A SIP OF WATER  Amlodipine, zyrtec, prevacid, flomax.    Do not wear jewelry, make-up or nail polish.  Do not wear lotions, powders, or perfumes, or deodorant.  Do not shave 48 hours prior to surgery.  Men may shave face and neck.  Do not bring valuables to the hospital.  [facility] is not responsible for any belongings or valuables.  Contacts, dentures or bridgework may not be worn into surgery.  Leave your suitcase in the car.  After surgery it may be brought to your room.  For patients admitted to the hospital, discharge time will be determined by your treatment team.  Patients discharged the day of surgery will not be allowed to drive home.   Name and phone number of your driver:   family Special instructions:  None  Please read over the following fact sheets that you were given. Anesthesia Post-op Instructions and Care and Recovery After Surgery       Upper Endoscopy, Adult Upper endoscopy is a procedure to look inside the upper GI (gastrointestinal) tract. The upper GI tract is made up of:  The part of the body that moves food from your mouth to your stomach (esophagus).  The stomach.  The first part of your small intestine (duodenum). This procedure is also called esophagogastroduodenoscopy (EGD) or gastroscopy. In this procedure, your health care provider passes a thin, flexible tube (endoscope) through your mouth and down your esophagus into your stomach. A small camera is attached to the end of the tube. Images from the camera appear on a monitor in the exam room.  During this procedure, your health care provider may also remove a small piece of tissue to be sent to a lab and examined under a microscope (biopsy). Your health care provider may do an upper endoscopy to diagnose cancers of the upper GI tract. You may also have this procedure to find the cause of other conditions, such as:  Stomach pain.  Heartburn.  Pain or problems when swallowing.  Nausea and vomiting.  Stomach bleeding.  Stomach ulcers. Tell a health care provider about:  Any allergies you have.  All medicines you are taking, including vitamins, herbs, eye drops, creams, and over-the-counter medicines.  Any problems you or family members have had with anesthetic medicines.  Any blood disorders you have.  Any surgeries you have had.  Any medical conditions you have.  Whether you are pregnant or may be pregnant. What are the risks? Generally, this is a safe procedure. However, problems may occur, including:  Infection.  Bleeding.  Allergic reactions to medicines.  A tear or hole (perforation) in the esophagus, stomach, or duodenum. What happens before the procedure? Staying hydrated Follow instructions from your health care provider about hydration, which may include:  Up to 2 hours before the procedure - you may continue to drink clear liquids, such as water, clear  fruit juice, black coffee, and plain tea.  Eating and drinking restrictions Follow instructions from your health care provider about eating and drinking, which may include:  8 hours before the procedure - stop eating heavy meals or foods, such as meat, fried foods, or fatty foods.  6 hours before the procedure - stop eating light meals or foods, such as toast or cereal.  6 hours before the procedure - stop drinking milk or drinks that contain milk.  2 hours before the procedure - stop drinking clear liquids. Medicines Ask your health care provider about:  Changing or stopping your regular  medicines. This is especially important if you are taking diabetes medicines or blood thinners.  Taking medicines such as aspirin and ibuprofen. These medicines can thin your blood. Do not take these medicines unless your health care provider tells you to take them.  Taking over-the-counter medicines, vitamins, herbs, and supplements. General instructions  Plan to have someone take you home from the hospital or clinic.  If you will be going home right after the procedure, plan to have someone with you for 24 hours.  Ask your health care provider what steps will be taken to help prevent infection. What happens during the procedure?   An IV will be inserted into one of your veins.  You may be given one or more of the following: ? A medicine to help you relax (sedative). ? A medicine to numb the throat (local anesthetic).  You will lie on your left side on an exam table.  Your health care provider will pass the endoscope through your mouth and down your esophagus.  Your health care provider will use the scope to check the inside of your esophagus, stomach, and duodenum. Biopsies may be taken.  The endoscope will be removed. The procedure may vary among health care providers and hospitals. What happens after the procedure?  Your blood pressure, heart rate, breathing rate, and blood oxygen level will be monitored until you leave the hospital or clinic.  Do not drive for 24 hours if you were given a sedative during your procedure.  When your throat is no longer numb, you may be given some fluids to drink.  It is up to you to get the results of your procedure. Ask your health care provider, or the department that is doing the procedure, when your results will be ready. Summary  Upper endoscopy is a procedure to look inside the upper GI tract.  During the procedure, an IV will be inserted into one of your veins. You may be given a medicine to help you relax.  A medicine will be  used to numb your throat.  The endoscope will be passed through your mouth and down your esophagus. This information is not intended to replace advice given to you by your health care provider. Make sure you discuss any questions you have with your health care provider. Document Released: 07/07/2000 Document Revised: 12/10/2017 Document Reviewed: 12/10/2017 Elsevier Interactive Patient Education  2019 [place] Endoscopy, Adult, Care After This sheet gives you information about how to care for yourself after your procedure. Your health care provider may also give you more specific instructions. If you have problems or questions, contact your health care provider. What can I expect after the procedure? After the procedure, it is common to have:  A sore throat.  Mild stomach pain or discomfort.  Bloating.  Nausea. Follow these instructions at home:   Follow instructions from your health  care provider about what to eat or drink after your procedure.  Return to your normal activities as told by your health care provider. Ask your health care provider what activities are safe for you.  Take over-the-counter and prescription medicines only as told by your health care provider.  Do not drive for 24 hours if you were given a sedative during your procedure.  Keep all follow-up visits as told by your health care provider. This is important. Contact a health care provider if you have:  A sore throat that lasts longer than one day.  Trouble swallowing. Get help right away if:  You vomit blood or your vomit looks like coffee grounds.  You have: ? A fever. ? Bloody, black, or tarry stools. ? A severe sore throat or you cannot swallow. ? Difficulty breathing. ? Severe pain in your chest or abdomen. Summary  After the procedure, it is common to have a sore throat, mild stomach discomfort, bloating, and nausea.  Do not drive for 24 hours if you were given a sedative  during the procedure.  Follow instructions from your health care provider about what to eat or drink after your procedure.  Return to your normal activities as told by your health care provider. This information is not intended to replace advice given to you by your health care provider. Make sure you discuss any questions you have with your health care provider. Document Released: 01/09/2012 Document Revised: 12/10/2017 Document Reviewed: 12/10/2017 Elsevier Interactive Patient Education  2019 [employer].  Colonoscopy, Adult A colonoscopy is an exam to look at the large intestine. It is done to check for problems, such as:  Lumps (tumors).  Growths (polyps).  Swelling (inflammation).  Bleeding. What happens before the procedure? Eating and drinking Follow instructions from your doctor about eating and drinking. These instructions may include:  A few days before the procedure - follow a low-fiber diet. ? Avoid nuts. ? Avoid seeds. ? Avoid dried fruit. ? Avoid raw fruits. ? Avoid vegetables.  1-3 days before the procedure - follow a clear liquid diet. Avoid liquids that have red or purple dye. Drink only clear liquids, such as: ? Clear broth or bouillon. ? Black coffee or tea. ? Clear juice. ? Clear soft drinks or sports drinks. ? Gelatin dessert. ? Popsicles.  On the day of the procedure - do not eat or drink anything during the 2 hours before the procedure. Up to 2 hours before the procedure, you may continue to drink clear liquids, such as water or clear fruit juice.  Bowel prep If you were prescribed an oral bowel prep:  Take it as told by your doctor. Starting the day before your procedure, you will need to drink a lot of liquid. The liquid will cause you to poop (have bowel movements) until your poop is almost clear or light green.  To clean out your colon, you may also be given: ? Laxative medicines. ? Instructions about how to use an enema.  If your skin or  butt gets irritated from diarrhea, you may: ? Wipe the area with wipes that have medicine in them, such as adult wet wipes with aloe and vitamin E. ? Put something on your skin that soothes the area, such as petroleum jelly.  If you throw up (vomit) while drinking the bowel prep, take a break for up to 60 minutes. Then begin the bowel prep again. If you keep throwing up and you cannot take the bowel prep without throwing  up, call your doctor. General instructions  Ask your doctor about: ? Changing or stopping your normal medicines. This is important if you take iron pills, diabetes medicines, or blood thinners. ? Taking medicines such as aspirin and ibuprofen. These medicines can thin your blood. Do not take these medicines unless your doctor tells you to take them.  Plan to have someone take you home from the hospital or clinic. What happens during the procedure?   An IV tube may be put into one of your veins.  You will be given medicine to help you relax (sedative).  To reduce your risk of infection: ? Your doctors will wash their hands. ? Your anal area will be washed with soap.  You will be asked to lie on your side with your knees bent.  Your doctor will get a long, thin, flexible tube ready. The tube will have a camera and a light on the end.  The tube will be put into your anus.  The tube will be gently put into your large intestine.  Air will be delivered into your large intestine to keep it open. You may feel some pressure or cramping.  The camera will be used to take photos.  A small tissue sample may be removed for testing (biopsy).  If small growths are found, your doctor may remove them and have them checked for cancer.  The tube that was put into your anus will be slowly removed. The procedure may vary among doctors and hospitals. What happens after the procedure?  Your doctor will check on you often until the medicines you were given have worn off.  Do not  drive for 24 hours after the procedure.  You may have a small amount of blood in your poop.  You may pass gas.  You may have mild cramps or bloating in your belly (abdomen).  It is up to you to get the results of your procedure. Ask your doctor, or the department performing the procedure, when your results will be ready. Summary  A colonoscopy is an exam to look at the large intestine.  Follow instructions from your doctor about eating and drinking before the procedure.  If you were prescribed an oral bowel prep to clean out your colon, take it as told by your doctor.  Your doctor will check on you often until the medicines you were given have worn off.  Plan to have someone take you home from the hospital or clinic. This information is not intended to replace advice given to you by your health care provider. Make sure you discuss any questions you have with your health care provider. Document Released: 08/12/2010 Document Revised: 05/09/2017 Document Reviewed: 09/21/2015 Elsevier Interactive Patient Education  2019 [employer]  Colonoscopy, Adult, Care After This sheet gives you information about how to care for yourself after your procedure. Your health care provider may also give you more specific instructions. If you have problems or questions, contact your health care provider. What can I expect after the procedure? After the procedure, it is common to have:  A small amount of blood in your stool for 24 hours after the procedure.  Some gas.  Mild abdominal cramping or bloating. Follow these instructions at home: General instructions  For the first 24 hours after the procedure: ? Do not drive or use machinery. ? Do not sign important documents. ? Do not drink alcohol. ? Do your regular daily activities at a slower pace than normal. ? Eat soft,  easy-to-digest foods.  Take over-the-counter or prescription medicines only as told by your health care provider. Relieving  cramping and bloating   Try walking around when you have cramps or feel bloated.  Apply heat to your abdomen as told by your health care provider. Use a heat source that your health care provider recommends, such as a moist heat pack or a heating pad. ? Place a towel between your skin and the heat source. ? Leave the heat on for 20-30 minutes. ? Remove the heat if your skin turns bright red. This is especially important if you are unable to feel pain, heat, or cold. You may have a greater risk of getting burned. Eating and drinking   Drink enough fluid to keep your urine pale yellow.  Resume your normal diet as instructed by your health care provider. Avoid heavy or fried foods that are hard to digest.  Avoid drinking alcohol for as long as instructed by your health care provider. Contact a health care provider if:  You have blood in your stool 2-3 days after the procedure. Get help right away if:  You have more than a small spotting of blood in your stool.  You pass large blood clots in your stool.  Your abdomen is swollen.  You have nausea or vomiting.  You have a fever.  You have increasing abdominal pain that is not relieved with medicine. Summary  After the procedure, it is common to have a small amount of blood in your stool. You may also have mild abdominal cramping and bloating.  For the first 24 hours after the procedure, do not drive or use machinery, sign important documents, or drink alcohol.  Contact your health care provider if you have a lot of blood in your stool, nausea or vomiting, a fever, or increased abdominal pain. This information is not intended to replace advice given to you by your health care provider. Make sure you discuss any questions you have with your health care provider. Document Released: 02/22/2004 Document Revised: 05/02/2017 Document Reviewed: 09/21/2015 Elsevier Interactive Patient Education  2019 [employer]  Esophageal  Dilatation Esophageal dilatation, also called esophageal dilation, is a procedure to widen or open (dilate) a blocked or narrowed part of the esophagus. The esophagus is the part of the body that moves food and liquid from the mouth to the stomach. You may need this procedure if:  You have a buildup of scar tissue in your esophagus that makes it difficult, painful, or impossible to swallow. This can be caused by gastroesophageal reflux disease (GERD).  You have cancer of the esophagus.  There is a problem with how food moves through your esophagus. In some cases, you may need this procedure repeated at a later time to dilate the esophagus gradually. Tell a health care provider about:  Any allergies you have.  All medicines you are taking, including vitamins, herbs, eye drops, creams, and over-the-counter medicines.  Any problems you or family members have had with anesthetic medicines.  Any blood disorders you have.  Any surgeries you have had.  Any medical conditions you have.  Any antibiotic medicines you are required to take before dental procedures.  Whether you are pregnant or may be pregnant. What are the risks? Generally, this is a safe procedure. However, problems may occur, including:  Bleeding due to a tear in the lining of the esophagus.  A hole (perforation) in the esophagus. What happens before the procedure?  Follow instructions from your health care  provider about eating or drinking restrictions.  Ask your health care provider about changing or stopping your regular medicines. This is especially important if you are taking diabetes medicines or blood thinners.  Plan to have someone take you home from the hospital or clinic.  Plan to have a responsible adult care for you for at least 24 hours after you leave the hospital or clinic. This is important. What happens during the procedure?  You may be given a medicine to help you relax (sedative).  A numbing  medicine may be sprayed into the back of your throat, or you may gargle the medicine.  Your health care provider may perform the dilatation using various surgical instruments, such as: ? Simple dilators. This instrument is carefully placed in the esophagus to stretch it. ? Guided wire bougies. This involves using an endoscope to insert a wire into the esophagus. A dilator is passed over this wire to enlarge the esophagus. Then the wire is removed. ? Balloon dilators. An endoscope with a small balloon at the end is inserted into the esophagus. The balloon is inflated to stretch the esophagus and open it up. The procedure may vary among health care providers and hospitals. What happens after the procedure?  Your blood pressure, heart rate, breathing rate, and blood oxygen level will be monitored until the medicines you were given have worn off.  Your throat may feel slightly sore and numb. This will improve slowly over time.  You will not be allowed to eat or drink until your throat is no longer numb.  When you are able to drink, urinate, and sit on the edge of the bed without nausea or dizziness, you may be able to return home. Follow these instructions at home:  Take over-the-counter and prescription medicines only as told by your health care provider.  Do not drive for 24 hours if you were given a sedative during your procedure.  You should have a responsible adult with you for 24 hours after the procedure.  Follow instructions from your health care provider about any eating or drinking restrictions.  Do not use any products that contain nicotine or tobacco, such as cigarettes and e-cigarettes. If you need help quitting, ask your health care provider.  Keep all follow-up visits as told by your health care provider. This is important. Get help right away if you:  Have a fever.  Have chest pain.  Have pain that is not relieved by medication.  Have trouble breathing.  Have  trouble swallowing.  Vomit blood. Summary  Esophageal dilatation, also called esophageal dilation, is a procedure to widen or open (dilate) a blocked or narrowed part of the esophagus.  Plan to have someone take you home from the hospital or clinic.  For this procedure, a numbing medicine may be sprayed into the back of your throat, or you may gargle the medicine.  Do not drive for 24 hours if you were given a sedative during your procedure. This information is not intended to replace advice given to you by your health care provider. Make sure you discuss any questions you have with your health care provider. Document Released: 08/31/2005 Document Revised: 05/15/2017 Document Reviewed: 05/15/2017 Elsevier Interactive Patient Education  2019 [place] Anesthesia is a term that refers to techniques, procedures, and medicines that help a person stay safe and comfortable during a medical procedure. Monitored anesthesia care, or sedation, is one type of anesthesia. Your anesthesia specialist may recommend sedation if  you will be having a procedure that does not require you to be unconscious, such as:  Cataract surgery.  A dental procedure.  A biopsy.  A colonoscopy. During the procedure, you may receive a medicine to help you relax (sedative). There are three levels of sedation:  Mild sedation. At this level, you may feel awake and relaxed. You will be able to follow directions.  Moderate sedation. At this level, you will be sleepy. You may not remember the procedure.  Deep sedation. At this level, you will be asleep. You will not remember the procedure. The more medicine you are given, the deeper your level of sedation will be. Depending on how you respond to the procedure, the anesthesia specialist may change your level of sedation or the type of anesthesia to fit your needs. An anesthesia specialist will monitor you closely during the procedure. Let  your health care provider know about:  Any allergies you have.  All medicines you are taking, including vitamins, herbs, eye drops, creams, and over-the-counter medicines.  Any use of steroids (by mouth or as a cream).  Any problems you or family members have had with sedatives and anesthetic medicines.  Any blood disorders you have.  Any surgeries you have had.  Any medical conditions you have, such as sleep apnea.  Whether you are pregnant or may be pregnant.  Any use of cigarettes, alcohol, or street drugs. What are the risks? Generally, this is a safe procedure. However, problems may occur, including:  Getting too much medicine (oversedation).  Nausea.  Allergic reaction to medicines.  Trouble breathing. If this happens, a breathing tube may be used to help with breathing. It will be removed when you are awake and breathing on your own.  Heart trouble.  Lung trouble. Before the procedure Staying hydrated Follow instructions from your health care provider about hydration, which may include:  Up to 2 hours before the procedure - you may continue to drink clear liquids, such as water, clear fruit juice, black coffee, and plain tea. Eating and drinking restrictions Follow instructions from your health care provider about eating and drinking, which may include:  8 hours before the procedure - stop eating heavy meals or foods such as meat, fried foods, or fatty foods.  6 hours before the procedure - stop eating light meals or foods, such as toast or cereal.  6 hours before the procedure - stop drinking milk or drinks that contain milk.  2 hours before the procedure - stop drinking clear liquids. Medicines Ask your health care provider about:  Changing or stopping your regular medicines. This is especially important if you are taking diabetes medicines or blood thinners.  Taking medicines such as aspirin and ibuprofen. These medicines can thin your blood. Do not take  these medicines before your procedure if your health care provider instructs you not to. Tests and exams  You will have a physical exam.  You may have blood tests done to show: ? How well your kidneys and liver are working. ? How well your blood can clot. General instructions  Plan to have someone take you home from the hospital or clinic.  If you will be going home right after the procedure, plan to have someone with you for 24 hours.  What happens during the procedure?  Your blood pressure, heart rate, breathing, level of pain and overall condition will be monitored.  An IV tube will be inserted into one of your veins.  Your anesthesia specialist will  give you medicines as needed to keep you comfortable during the procedure. This may mean changing the level of sedation.  The procedure will be performed. After the procedure  Your blood pressure, heart rate, breathing rate, and blood oxygen level will be monitored until the medicines you were given have worn off.  Do not drive for 24 hours if you received a sedative.  You may: ? Feel sleepy, clumsy, or nauseous. ? Feel forgetful about what happened after the procedure. ? Have a sore throat if you had a breathing tube during the procedure. ? Vomit. This information is not intended to replace advice given to you by your health care provider. Make sure you discuss any questions you have with your health care provider. Document Released: 04/05/2005 Document Revised: 12/17/2015 Document Reviewed: 10/31/2015 Elsevier Interactive Patient Education  2019 [place], Care After These instructions provide you with information about caring for yourself after your procedure. Your health care provider may also give you more specific instructions. Your treatment has been planned according to current medical practices, but problems sometimes occur. Call your health care provider if you have any problems or  questions after your procedure. What can I expect after the procedure? After your procedure, you may:  Feel sleepy for several hours.  Feel clumsy and have poor balance for several hours.  Feel forgetful about what happened after the procedure.  Have poor judgment for several hours.  Feel nauseous or vomit.  Have a sore throat if you had a breathing tube during the procedure. Follow these instructions at home: For at least 24 hours after the procedure:      Have a responsible adult stay with you. It is important to have someone help care for you until you are awake and alert.  Rest as needed.  Do not: ? Participate in activities in which you could fall or become injured. ? Drive. ? Use heavy machinery. ? Drink alcohol. ? Take sleeping pills or medicines that cause drowsiness. ? Make important decisions or sign legal documents. ? Take care of children on your own. Eating and drinking  Follow the diet that is recommended by your health care provider.  If you vomit, drink water, juice, or soup when you can drink without vomiting.  Make sure you have little or no nausea before eating solid foods. General instructions  Take over-the-counter and prescription medicines only as told by your health care provider.  If you have sleep apnea, surgery and certain medicines can increase your risk for breathing problems. Follow instructions from your health care provider about wearing your sleep device: ? Anytime you are sleeping, including during daytime naps. ? While taking prescription pain medicines, sleeping medicines, or medicines that make you drowsy.  If you smoke, do not smoke without supervision.  Keep all follow-up visits as told by your health care provider. This is important. Contact a health care provider if:  You keep feeling nauseous or you keep vomiting.  You feel light-headed.  You develop a rash.  You have a fever. Get help right away if:  You have  trouble breathing. Summary  For several hours after your procedure, you may feel sleepy and have poor judgment.  Have a responsible adult stay with you for at least 24 hours or until you are awake and alert. This information is not intended to replace advice given to you by your health care provider. Make sure you discuss any questions you have with your health care  provider. Document Released: 10/31/2015 Document Revised: 02/23/2017 Document Reviewed: 10/31/2015 Elsevier Interactive Patient Education  2019 [employer].

## 2018-08-19 ENCOUNTER — Telehealth: Payer: Self-pay | Admitting: Adult Health

## 2018-08-19 ENCOUNTER — Other Ambulatory Visit: Payer: Self-pay

## 2018-08-19 ENCOUNTER — Ambulatory Visit (HOSPITAL_COMMUNITY)
Admission: RE | Admit: 2018-08-19 | Discharge: 2018-08-19 | Disposition: A | Discharge status: Home / Self Care | Payer: Medicare Other | Source: Ambulatory Visit | Attending: Internal Medicine | Admitting: Internal Medicine

## 2018-08-19 ENCOUNTER — Encounter (HOSPITAL_COMMUNITY): Payer: Self-pay

## 2018-08-19 DIAGNOSIS — Z760 Encounter for issue of repeat prescription: Secondary | ICD-10-CM

## 2018-08-19 DIAGNOSIS — Z01818 Encounter for other preprocedural examination: Secondary | ICD-10-CM | POA: Diagnosis not present

## 2018-08-19 HISTORY — DX: Supraventricular tachycardia, unspecified: I47.10

## 2018-08-19 HISTORY — DX: Supraventricular tachycardia: I47.1

## 2018-08-19 HISTORY — DX: Benign prostatic hyperplasia without lower urinary tract symptoms: N40.0

## 2018-08-19 LAB — BASIC METABOLIC PANEL
Anion gap: 8 (ref 5–15)
BUN: 23 mg/dL (ref 8–23)
CO2: 23 mmol/L (ref 22–32)
CREATININE: 0.9 mg/dL (ref 0.61–1.24)
Calcium: 8.4 mg/dL — ABNORMAL LOW (ref 8.9–10.3)
Chloride: 104 mmol/L (ref 98–111)
GFR calc Af Amer: >60 mL/min (ref >60)
GFR calc non Af Amer: >60 mL/min (ref >60)
Glucose, Bld: 148 mg/dL — ABNORMAL HIGH (ref 70–99)
Potassium: 3.9 mmol/L (ref 3.5–5.1)
Sodium: 135 mmol/L (ref 135–145)

## 2018-08-19 LAB — CBC
HCT: 44.4 % (ref 39.0–52.0)
Hemoglobin: 15.1 g/dL (ref 13.0–17.0)
MCH: 32.5 pg (ref 26.0–34.0)
MCHC: 34 g/dL (ref 30.0–36.0)
MCV: 95.5 fL (ref 80.0–100.0)
Platelets: 204 10*3/uL (ref 150–400)
RBC: 4.65 MIL/uL (ref 4.22–5.81)
RDW: 12.6 % (ref 11.5–15.5)
WBC: 5.6 10*3/uL (ref 4.0–10.5)
nRBC: 0 % (ref 0.0–0.2)

## 2018-08-19 NOTE — Telephone Encounter (Signed)
Copied from [place] [id number]. Topic: General - Other >> Aug 19, 2018  1:20 PM Mcneil, Ja-Kwan wrote: Reason for CRM: Pt stated that he is having a problem with the pharmacy filling the Rx for tamsulosin (FLOMAX) 0.4 MG CAPS capsule. Pt stated that it is only a 15 day prescription and he would like the Rx to last at least 30 days. Pt requests call back. Cb# [phone]

## 2018-08-20 ENCOUNTER — Encounter: Payer: Self-pay | Admitting: Family Medicine

## 2018-08-20 MED ORDER — TAMSULOSIN HCL 0.4 MG PO CAPS: 0.8000 mg | ORAL_CAPSULE | Freq: Every day | ORAL | 0 refills | Status: DC

## 2018-08-20 NOTE — Telephone Encounter (Signed)
How is the patient supposed to be taking his medication? tamsulosin (FLOMAX) 0.4 MG CAPS capsule [[id number]]  Dose: 0.8 mg Route: Oral Frequency: Daily  Note to Pharmacy:  This prescription was filled on 09/18/2017. Any refills authorized will be placed on file.      Dispense Quantity: 30 capsule Refills: 5 Fills remaining: --        Sig: Take 2 capsules (0.8 mg total) by mouth daily.  Patient taking differently: Take 0.4 mg by mouth 2 (two) times daily.           Otho Darner is wrong on the RX, should have been #60 -- please advise if instructions are correct -- 1 tablet (0.4mg  ) BID?

## 2018-08-20 NOTE — Telephone Encounter (Signed)
Sent to the pharmacy by e-scribe for 30 days.  Pt now due for cpx and fasting lab work.  Letter mailed.

## 2018-08-20 NOTE — Telephone Encounter (Signed)
Daniel Guerrero, should pt be taking 2 tabs daily?  Looks like urology should be prescribing this medication. Pt was to follow up with Dr. Louis Meckel.

## 2018-08-20 NOTE — Telephone Encounter (Signed)
We have prescribed it in the past. He should be taking 2 pills a day

## 2018-08-26 ENCOUNTER — Ambulatory Visit (HOSPITAL_COMMUNITY): Payer: Medicare Other | Admitting: Anesthesiology

## 2018-08-26 ENCOUNTER — Encounter (HOSPITAL_COMMUNITY): Payer: Self-pay | Admitting: *Deleted

## 2018-08-26 ENCOUNTER — Encounter (HOSPITAL_COMMUNITY)
Admission: RE | Disposition: A | Discharge status: Home / Self Care | Payer: Self-pay | Source: Home / Self Care | Attending: Internal Medicine

## 2018-08-26 ENCOUNTER — Ambulatory Visit (HOSPITAL_COMMUNITY)
Admission: RE | Admit: 2018-08-26 | Discharge: 2018-08-26 | Disposition: A | Discharge status: Home / Self Care | Payer: Medicare Other | Attending: Internal Medicine | Admitting: Internal Medicine

## 2018-08-26 DIAGNOSIS — L409 Psoriasis, unspecified: Secondary | ICD-10-CM | POA: Diagnosis not present

## 2018-08-26 DIAGNOSIS — K5730 Diverticulosis of large intestine without perforation or abscess without bleeding: Secondary | ICD-10-CM | POA: Insufficient documentation

## 2018-08-26 DIAGNOSIS — I471 Supraventricular tachycardia: Secondary | ICD-10-CM | POA: Diagnosis not present

## 2018-08-26 DIAGNOSIS — H9190 Unspecified hearing loss, unspecified ear: Secondary | ICD-10-CM | POA: Insufficient documentation

## 2018-08-26 DIAGNOSIS — N400 Enlarged prostate without lower urinary tract symptoms: Secondary | ICD-10-CM | POA: Insufficient documentation

## 2018-08-26 DIAGNOSIS — I10 Essential (primary) hypertension: Secondary | ICD-10-CM | POA: Insufficient documentation

## 2018-08-26 DIAGNOSIS — Z880 Allergy status to penicillin: Secondary | ICD-10-CM | POA: Diagnosis not present

## 2018-08-26 DIAGNOSIS — Z87442 Personal history of urinary calculi: Secondary | ICD-10-CM | POA: Diagnosis not present

## 2018-08-26 DIAGNOSIS — H8109 Meniere's disease, unspecified ear: Secondary | ICD-10-CM | POA: Insufficient documentation

## 2018-08-26 DIAGNOSIS — R1310 Dysphagia, unspecified: Secondary | ICD-10-CM

## 2018-08-26 DIAGNOSIS — E119 Type 2 diabetes mellitus without complications: Secondary | ICD-10-CM | POA: Insufficient documentation

## 2018-08-26 DIAGNOSIS — J45909 Unspecified asthma, uncomplicated: Secondary | ICD-10-CM | POA: Insufficient documentation

## 2018-08-26 DIAGNOSIS — Z9842 Cataract extraction status, left eye: Secondary | ICD-10-CM | POA: Diagnosis not present

## 2018-08-26 DIAGNOSIS — K449 Diaphragmatic hernia without obstruction or gangrene: Secondary | ICD-10-CM

## 2018-08-26 DIAGNOSIS — Z1211 Encounter for screening for malignant neoplasm of colon: Secondary | ICD-10-CM | POA: Insufficient documentation

## 2018-08-26 DIAGNOSIS — Z881 Allergy status to other antibiotic agents status: Secondary | ICD-10-CM | POA: Diagnosis not present

## 2018-08-26 DIAGNOSIS — Z79899 Other long term (current) drug therapy: Secondary | ICD-10-CM | POA: Diagnosis not present

## 2018-08-26 DIAGNOSIS — L309 Dermatitis, unspecified: Secondary | ICD-10-CM | POA: Insufficient documentation

## 2018-08-26 DIAGNOSIS — D124 Benign neoplasm of descending colon: Secondary | ICD-10-CM | POA: Insufficient documentation

## 2018-08-26 DIAGNOSIS — Z91010 Allergy to peanuts: Secondary | ICD-10-CM | POA: Diagnosis not present

## 2018-08-26 DIAGNOSIS — Z961 Presence of intraocular lens: Secondary | ICD-10-CM | POA: Insufficient documentation

## 2018-08-26 DIAGNOSIS — Z9841 Cataract extraction status, right eye: Secondary | ICD-10-CM | POA: Diagnosis not present

## 2018-08-26 DIAGNOSIS — K210 Gastro-esophageal reflux disease with esophagitis: Secondary | ICD-10-CM | POA: Insufficient documentation

## 2018-08-26 DIAGNOSIS — Z860100 Personal history of colon polyps, unspecified: Secondary | ICD-10-CM

## 2018-08-26 DIAGNOSIS — Z9049 Acquired absence of other specified parts of digestive tract: Secondary | ICD-10-CM | POA: Insufficient documentation

## 2018-08-26 DIAGNOSIS — R51 Headache: Secondary | ICD-10-CM | POA: Insufficient documentation

## 2018-08-26 DIAGNOSIS — K222 Esophageal obstruction: Secondary | ICD-10-CM | POA: Diagnosis not present

## 2018-08-26 DIAGNOSIS — Z87891 Personal history of nicotine dependence: Secondary | ICD-10-CM | POA: Insufficient documentation

## 2018-08-26 HISTORY — PX: ESOPHAGOGASTRODUODENOSCOPY (EGD) WITH PROPOFOL: SHX5813

## 2018-08-26 HISTORY — PX: POLYPECTOMY: SHX5525

## 2018-08-26 HISTORY — PX: COLONOSCOPY WITH PROPOFOL: SHX5780

## 2018-08-26 HISTORY — PX: MALONEY DILATION: SHX5535

## 2018-08-26 LAB — GLUCOSE, CAPILLARY: GLUCOSE-CAPILLARY: 103 mg/dL — AB (ref 70–99)

## 2018-08-26 SURGERY — COLONOSCOPY WITH PROPOFOL
Anesthesia: Monitor Anesthesia Care

## 2018-08-26 MED ORDER — STERILE WATER FOR IRRIGATION IR SOLN
Status: DC | PRN
  Administered 2018-08-26: 100 mL

## 2018-08-26 MED ORDER — LACTATED RINGERS IV SOLN
INTRAVENOUS | Status: DC
  Administered 2018-08-26: 09:00:00 via INTRAVENOUS

## 2018-08-26 MED ORDER — PROPOFOL 10 MG/ML IV BOLUS
INTRAVENOUS | Status: AC
  Filled 2018-08-26: qty 40

## 2018-08-26 MED ORDER — PROPOFOL 500 MG/50ML IV EMUL
INTRAVENOUS | Status: DC | PRN
  Administered 2018-08-26: 11:00:00 via INTRAVENOUS
  Administered 2018-08-26: 125 ug/kg/min via INTRAVENOUS

## 2018-08-26 MED ORDER — CHLORHEXIDINE GLUCONATE CLOTH 2 % EX PADS: 6.0000 | MEDICATED_PAD | Freq: Once | CUTANEOUS | Status: DC

## 2018-08-26 MED ORDER — KETAMINE HCL 50 MG/5ML IJ SOSY
PREFILLED_SYRINGE | INTRAMUSCULAR | Status: AC
  Filled 2018-08-26: qty 5

## 2018-08-26 MED ORDER — KETAMINE HCL 10 MG/ML IJ SOLN
INTRAMUSCULAR | Status: DC | PRN
  Administered 2018-08-26: 10 mg via INTRAVENOUS

## 2018-08-26 NOTE — Op Note (Signed)
[facility] Patient Name: Daniel Guerrero Procedure Date: 08/26/2018 10:17 AM MRN: [id number] Date of Birth: 1946/06/26 Attending MD: Norvel Richards , MD CSN: [id number] Age: 73 Admit Type: Outpatient Procedure:                Upper GI endoscopy Indications:              Dysphagia Providers:                Norvel Richards, MD, Rosina Lowenstein, RN, Aram Candela Referring MD:              Medicines:                Propofol per Anesthesia Complications:            No immediate complications. Estimated Blood Loss:     Estimated blood loss was minimal. Procedure:                Pre-Anesthesia Assessment:                           - Prior to the procedure, a History and Physical                            was performed, and patient medications and                            allergies were reviewed. The patient's tolerance of                            previous anesthesia was also reviewed. The risks                            and benefits of the procedure and the sedation                            options and risks were discussed with the patient.                            All questions were answered, and informed consent                            was obtained. Prior Anticoagulants: The patient has                            taken no previous anticoagulant or antiplatelet                            agents. ASA Grade Assessment: II - A patient with                            mild systemic disease. After reviewing the risks  and benefits, the patient was deemed in                            satisfactory condition to undergo the procedure.                           After obtaining informed consent, the endoscope was                            passed under direct vision. Throughout the                            procedure, the patient's blood pressure, pulse, and                            oxygen saturations were monitored  continuously. The                            [id number]-H190 ([id number]) scope was introduced through the                            and advanced to the second part of duodenum. The                            upper GI endoscopy was accomplished without                            difficulty. The patient tolerated the procedure                            well. Scope In: 10:42:18 AM Scope Out: 10:48:30 AM Total Procedure Duration: 0 hours 6 minutes 12 seconds  Findings:      A mild Schatzki ring was found at the gastroesophageal junction. Distal       esophageal erosions also present. No mass. No Barrett's epithelium seen       The scope was withdrawn. Dilation was performed with a Maloney dilator       with mild resistance at 56 Fr. The dilation site was examined following       endoscope reinsertion and showed no change. Estimated blood loss: none.       Ring remained intact after passage of a Maloney dilator. Utilizing       biopsy forceps, four-quadrant bites of the ring were taken to disrupted.       This was done effectively and without apparent complication.      A small hiatal hernia was present.      The entire examined stomach was normal.      The duodenal bulb and second portion of the duodenum were normal. Impression:               - Mild Schatzki ring. Dilated /disrupted.                           - Small hiatal hernia.                           -  Normal stomach.                           - Normal duodenal bulb and second portion of the                            duodenum.                           - No specimens collected. Moderate Sedation:      Moderate (conscious) sedation was personally administered by an       anesthesia professional. The following parameters were monitored: oxygen       saturation, heart rate, blood pressure, respiratory rate, EKG, adequacy       of pulmonary ventilation, and response to care. Recommendation:           - Patient has a contact number available  for                            emergencies. The signs and symptoms of potential                            delayed complications were discussed with the                            patient. Return to normal activities tomorrow.                            Written discharge instructions were provided to the                            patient.                           - Resume previous diet.                           - Patient has a contact number available for                            emergencies. The signs and symptoms of potential                            delayed complications were discussed with the                            patient. Return to normal activities tomorrow.                            Written discharge instructions were provided to the                            patient.                           -Increase Prevacid 30 mg twice daily for the next 3  months                           - Return to GI office in 3 months. See colonoscopy                            report. Procedure Code(s):        --- Professional ---                           [phone], Esophagogastroduodenoscopy, flexible,                            transoral; diagnostic, including collection of                            specimen(s) by brushing or washing, when performed                            (separate procedure)                           43450, Dilation of esophagus, by unguided sound or                            bougie, single or multiple passes Diagnosis Code(s):        --- Professional ---                           K22.2, Esophageal obstruction                           K44.9, Diaphragmatic hernia without obstruction or                            gangrene                           R13.10, Dysphagia, unspecified CPT copyright 2018 American Medical Association. All rights reserved. The codes documented in this report are preliminary and upon coder review may  be revised to  meet current compliance requirements. Cristopher Estimable. Polly Barner, MD Norvel Richards, MD 08/26/2018 11:18:57 AM This report has been signed electronically. Number of Addenda: 0

## 2018-08-26 NOTE — Op Note (Signed)
[facility] Patient Name: Daniel Guerrero Procedure Date: 08/26/2018 10:12 AM MRN: [id number] Date of Birth: Mar 01, 1946 Attending MD: Norvel Richards , MD CSN: [id number] Age: 73 Admit Type: Outpatient Procedure:                Colonoscopy Indications:              High risk colon cancer surveillance: Personal                            history of colonic polyps Providers:                Norvel Richards, MD, Rosina Lowenstein, RN, Aram Candela Referring MD:             Dorothyann Peng Medicines:                Propofol per Anesthesia Complications:            No immediate complications. Estimated Blood Loss:     Estimated blood loss was minimal. Procedure:                Pre-Anesthesia Assessment:                           - Prior to the procedure, a History and Physical                            was performed, and patient medications and                            allergies were reviewed. The patient's tolerance of                            previous anesthesia was also reviewed. The risks                            and benefits of the procedure and the sedation                            options and risks were discussed with the patient.                            All questions were answered, and informed consent                            was obtained. Prior Anticoagulants: The patient has                            taken no previous anticoagulant or antiplatelet                            agents. ASA Grade Assessment: II - A patient with  mild systemic disease. After reviewing the risks                            and benefits, the patient was deemed in                            satisfactory condition to undergo the procedure.                           After obtaining informed consent, the colonoscope                            was passed under direct vision. Throughout the                            procedure, the  patient's blood pressure, pulse, and                            oxygen saturations were monitored continuously. The                            CF-[id number] ([id number]) scope was introduced through                            the anus and advanced to the the cecum, identified                            by appendiceal orifice and ileocecal valve. The                            colonoscopy was performed without difficulty. The                            patient tolerated the procedure well. The quality                            of the bowel preparation was adequate. Scope In: 10:53:26 AM Scope Out: 11:08:58 AM Scope Withdrawal Time: 0 hours 9 minutes 16 seconds  Total Procedure Duration: 0 hours 15 minutes 32 seconds  Findings:      Scattered medium-mouthed diverticula were found in the entire colon.      Two sessile polyps were found in the descending colon. The polyps were 4       to 6 mm in size. These polyps were removed with a cold snare. Resection       and retrieval were complete. Estimated blood loss was minimal.      The exam was otherwise without abnormality on direct and retroflexion       views. Impression:               - Diverticulosis in the entire examined colon.                           - Two 4 to 6 mm polyps in the descending colon,  removed with a cold snare. Resected and retrieved.                           - The examination was otherwise normal on direct                            and retroflexion views. Moderate Sedation:      Moderate (conscious) sedation was personally administered by an       anesthesia professional. The following parameters were monitored: oxygen       saturation, heart rate, blood pressure, respiratory rate, EKG, adequacy       of pulmonary ventilation, and response to care. Recommendation:           - Patient has a contact number available for                            emergencies. The signs and symptoms of potential                             delayed complications were discussed with the                            patient. Return to normal activities tomorrow.                            Written discharge instructions were provided to the                            patient.                           - Resume previous diet.                           - Continue present medications.                           - Repeat colonoscopy date to be determined after                            pending pathology results are reviewed for                            surveillance based on pathology results.                           - Return to GI office in 3 months. See EGD report. Procedure Code(s):        --- Professional ---                           [phone], Colonoscopy, flexible; with removal of                            tumor(s), polyp(s), or other lesion(s) by snare  technique Diagnosis Code(s):        --- Professional ---                           Z86.010, Personal history of colonic polyps                           D12.4, Benign neoplasm of descending colon                           K57.30, Diverticulosis of large intestine without                            perforation or abscess without bleeding CPT copyright 2018 American Medical Association. All rights reserved. The codes documented in this report are preliminary and upon coder review may  be revised to meet current compliance requirements. Cristopher Estimable. Danai Gotto, MD Norvel Richards, MD 08/26/2018 11:22:14 AM This report has been signed electronically. Number of Addenda: 0

## 2018-08-26 NOTE — Anesthesia Postprocedure Evaluation (Signed)
Anesthesia Post Note  Patient: TRENDON ZARING  Procedure(s) Performed: COLONOSCOPY WITH PROPOFOL (N/A ) ESOPHAGOGASTRODUODENOSCOPY (EGD) WITH PROPOFOL (N/A ) MALONEY DILATION (N/A ) POLYPECTOMY  Patient location during evaluation: PACU Anesthesia Type: MAC Level of consciousness: awake and patient cooperative Pain management: pain level controlled Vital Signs Assessment: post-procedure vital signs reviewed and stable Respiratory status: spontaneous breathing, nonlabored ventilation and respiratory function stable Cardiovascular status: blood pressure returned to baseline Postop Assessment: no apparent nausea or vomiting Anesthetic complications: no     Last Vitals:  Vitals:   08/26/18 0900  BP: 103/70  Pulse: (!) 55  Resp: 18  Temp: (!) 36.3 C  SpO2: 99%    Last Pain:  Vitals:   08/26/18 1109  TempSrc:   PainSc: 0-No pain                 Rosaisela Jamroz J

## 2018-08-26 NOTE — Anesthesia Preprocedure Evaluation (Signed)
Anesthesia Evaluation  Patient identified by MRN, date of birth, ID band Patient awake    Reviewed: Allergy & Precautions, H&P , NPO status , Patient's Chart, lab work & pertinent test results  Airway Mallampati: II  TM Distance: <3 FB Neck ROM: full    Dental no notable dental hx.    Pulmonary asthma , former smoker,    Pulmonary exam normal breath sounds clear to auscultation       Cardiovascular Exercise Tolerance: Good hypertension, negative cardio ROS   Rhythm:regular Rate:Normal     Neuro/Psych  Headaches, negative psych ROS   GI/Hepatic negative GI ROS, Neg liver ROS,   Endo/Other  negative endocrine ROSdiabetes  Renal/GU Renal disease  negative genitourinary   Musculoskeletal   Abdominal   Peds  Hematology negative hematology ROS (+)   Anesthesia Other Findings   Reproductive/Obstetrics negative OB ROS                             Anesthesia Physical Anesthesia Plan  ASA: II  Anesthesia Plan: MAC   Post-op Pain Management:    Induction:   PONV Risk Score and Plan:   Airway Management Planned:   Additional Equipment:   Intra-op Plan:   Post-operative Plan:   Informed Consent: I have reviewed the patients History and Physical, chart, labs and discussed the procedure including the risks, benefits and alternatives for the proposed anesthesia with the patient or authorized representative who has indicated his/her understanding and acceptance.     Dental Advisory Given  Plan Discussed with: CRNA  Anesthesia Plan Comments:         Anesthesia Quick Evaluation

## 2018-08-26 NOTE — Transfer of Care (Signed)
Immediate Anesthesia Transfer of Care Note  Patient: Daniel Guerrero  Procedure(s) Performed: COLONOSCOPY WITH PROPOFOL (N/A ) ESOPHAGOGASTRODUODENOSCOPY (EGD) WITH PROPOFOL (N/A ) MALONEY DILATION (N/A ) POLYPECTOMY  Patient Location: PACU  Anesthesia Type:MAC  Level of Consciousness: awake and patient cooperative  Airway & Oxygen Therapy: Patient Spontanous Breathing  Post-op Assessment: Report given to RN, Post -op Vital signs reviewed and stable and Patient moving all extremities  Post vital signs: Reviewed and stable  Last Vitals:  Vitals Value Taken Time  BP    Temp    Pulse    Resp    SpO2      Last Pain:  Vitals:   08/26/18 1109  TempSrc:   PainSc: 0-No pain         Complications: No apparent anesthesia complications

## 2018-08-26 NOTE — Discharge Instructions (Signed)
°Colonoscopy °Discharge Instructions ° °Read the instructions outlined below and refer to this sheet in the next few weeks. These discharge instructions provide you with general information on caring for yourself after you leave the hospital. Your doctor may also give you specific instructions. While your treatment has been planned according to the most current medical practices available, unavoidable complications occasionally occur. If you have any problems or questions after discharge, call Dr. Rourk at [phone]. °ACTIVITY °· You may resume your regular activity, but move at a slower pace for the next 24 hours.  °· Take frequent rest periods for the next 24 hours.  °· Walking will help get rid of the air and reduce the bloated feeling in your belly (abdomen).  °· No driving for 24 hours (because of the medicine (anesthesia) used during the test).   °· Do not sign any important legal documents or operate any machinery for 24 hours (because of the anesthesia used during the test).  °NUTRITION °· Drink plenty of fluids.  °· You may resume your normal diet as instructed by your doctor.  °· Begin with a light meal and progress to your normal diet. Heavy or fried foods are harder to digest and may make you feel sick to your stomach (nauseated).  °· Avoid alcoholic beverages for 24 hours or as instructed.  °MEDICATIONS °· You may resume your normal medications unless your doctor tells you otherwise.  °WHAT YOU CAN EXPECT TODAY °· Some feelings of bloating in the abdomen.  °· Passage of more gas than usual.  °· Spotting of blood in your stool or on the toilet paper.  °IF YOU HAD POLYPS REMOVED DURING THE COLONOSCOPY: °· No aspirin products for 7 days or as instructed.  °· No alcohol for 7 days or as instructed.  °· Eat a soft diet for the next 24 hours.  °FINDING OUT THE RESULTS OF YOUR TEST °Not all test results are available during your visit. If your test results are not back during the visit, make an appointment  with your caregiver to find out the results. Do not assume everything is normal if you have not heard from your caregiver or the medical facility. It is important for you to follow up on all of your test results.  °SEEK IMMEDIATE MEDICAL ATTENTION IF: °· You have more than a spotting of blood in your stool.  °· Your belly is swollen (abdominal distention).  °· You are nauseated or vomiting.  °· You have a temperature over 101.  °· You have abdominal pain or discomfort that is severe or gets worse throughout the day.  °EGD °Discharge instructions °Please read the instructions outlined below and refer to this sheet in the next few weeks. These discharge instructions provide you with general information on caring for yourself after you leave the hospital. Your doctor may also give you specific instructions. While your treatment has been planned according to the most current medical practices available, unavoidable complications occasionally occur. If you have any problems or questions after discharge, please call your doctor. °ACTIVITY °· You may resume your regular activity but move at a slower pace for the next 24 hours.  °· Take frequent rest periods for the next 24 hours.  °· Walking will help expel (get rid of) the air and reduce the bloated feeling in your abdomen.  °· No driving for 24 hours (because of the anesthesia (medicine) used during the test).  °· You may shower.  °· Do not sign any important   legal documents or operate any machinery for 24 hours (because of the anesthesia used during the test).  NUTRITION  Drink plenty of fluids.   You may resume your normal diet.   Begin with a light meal and progress to your normal diet.   Avoid alcoholic beverages for 24 hours or as instructed by your caregiver.  MEDICATIONS  You may resume your normal medications unless your caregiver tells you otherwise.  WHAT YOU CAN EXPECT TODAY  You may experience abdominal discomfort such as a feeling of fullness  or gas pains.  FOLLOW-UP  Your doctor will discuss the results of your test with you.  SEEK IMMEDIATE MEDICAL ATTENTION IF ANY OF THE FOLLOWING OCCUR:  Excessive nausea (feeling sick to your stomach) and/or vomiting.   Severe abdominal pain and distention (swelling).   Trouble swallowing.   Temperature over 101 F (37.8 C).   Rectal bleeding or vomiting of blood.   Gastroesophageal Reflux Disease, Adult Gastroesophageal reflux (GER) happens when acid from the stomach flows up into the tube that connects the mouth and the stomach (esophagus). Normally, food travels down the esophagus and stays in the stomach to be digested. With GER, food and stomach acid sometimes move back up into the esophagus. You may have a disease called gastroesophageal reflux disease (GERD) if the reflux:  Happens often.  Causes frequent or very bad symptoms.  Causes problems such as damage to the esophagus. When this happens, the esophagus becomes sore and swollen (inflamed). Over time, GERD can make small holes (ulcers) in the lining of the esophagus. What are the causes? This condition is caused by a problem with the muscle between the esophagus and the stomach. When this muscle is weak or not normal, it does not close properly to keep food and acid from coming back up from the stomach. The muscle can be weak because of:  Tobacco use.  Pregnancy.  Having a certain type of hernia (hiatal hernia).  Alcohol use.  Certain foods and drinks, such as coffee, chocolate, onions, and peppermint. What increases the risk? You are more likely to develop this condition if you:  Are overweight.  Have a disease that affects your connective tissue.  Use NSAID medicines. What are the signs or symptoms? Symptoms of this condition include:  Heartburn.  Difficult or painful swallowing.  The feeling of having a lump in the throat.  A bitter taste in the mouth.  Bad breath.  Having a lot of  saliva.  Having an upset or bloated stomach.  Belching.  Chest pain. Different conditions can cause chest pain. Make sure you see your doctor if you have chest pain.  Shortness of breath or noisy breathing (wheezing).  Ongoing (chronic) cough or a cough at night.  Wearing away of the surface of teeth (tooth enamel).  Weight loss. How is this treated? Treatment will depend on how bad your symptoms are. Your doctor may suggest:  Changes to your diet.  Medicine.  Surgery. Follow these instructions at home: Eating and drinking   Follow a diet as told by your doctor. You may need to avoid foods and drinks such as: ? Coffee and tea (with or without caffeine). ? Drinks that contain alcohol. ? Energy drinks and sports drinks. ? Bubbly (carbonated) drinks or sodas. ? Chocolate and cocoa. ? Peppermint and mint flavorings. ? Garlic and onions. ? Horseradish. ? Spicy and acidic foods. These include peppers, chili powder, curry powder, vinegar, hot sauces, and BBQ sauce. ? Citrus fruit  juices and citrus fruits, such as oranges, lemons, and limes. ? Tomato-based foods. These include red sauce, chili, salsa, and pizza with red sauce. ? Fried and fatty foods. These include donuts, french fries, potato chips, and high-fat dressings. ? High-fat meats. These include hot dogs, rib eye steak, sausage, ham, and bacon. ? High-fat dairy items, such as whole milk, butter, and cream cheese.  Eat small meals often. Avoid eating large meals.  Avoid drinking large amounts of liquid with your meals.  Avoid eating meals during the 2-3 hours before bedtime.  Avoid lying down right after you eat.  Do not exercise right after you eat. Lifestyle   Do not use any products that contain nicotine or tobacco. These include cigarettes, e-cigarettes, and chewing tobacco. If you need help quitting, ask your doctor.  Try to lower your stress. If you need help doing this, ask your doctor.  If you are  overweight, lose an amount of weight that is healthy for you. Ask your doctor about a safe weight loss goal. General instructions  Pay attention to any changes in your symptoms.  Take over-the-counter and prescription medicines only as told by your doctor. Do not take aspirin, ibuprofen, or other NSAIDs unless your doctor says it is okay.  Wear loose clothes. Do not wear anything tight around your waist.  Raise (elevate) the head of your bed about 6 inches (15 cm).  Avoid bending over if this makes your symptoms worse.  Keep all follow-up visits as told by your doctor. This is important. Contact a doctor if:  You have new symptoms.  You lose weight and you do not know why.  You have trouble swallowing or it hurts to swallow.  You have wheezing or a cough that keeps happening.  Your symptoms do not get better with treatment.  You have a hoarse voice. Get help right away if:  You have pain in your arms, neck, jaw, teeth, or back.  You feel sweaty, dizzy, or light-headed.  You have chest pain or shortness of breath.  You throw up (vomit) and your throw-up looks like blood or coffee grounds.  You pass out (faint).  Your poop (stool) is bloody or black.  You cannot swallow, drink, or eat. Summary  If a person has gastroesophageal reflux disease (GERD), food and stomach acid move back up into the esophagus and cause symptoms or problems such as damage to the esophagus.  Treatment will depend on how bad your symptoms are.  Follow a diet as told by your doctor.  Take all medicines only as told by your doctor. This information is not intended to replace advice given to you by your health care provider. Make sure you discuss any questions you have with your health care provider. Document Released: 12/27/2007 Document Revised: 01/16/2018 Document Reviewed: 01/16/2018 Elsevier Interactive Patient Education  2019 [place].  Colon Polyps  Polyps are tissue growths  inside the body. Polyps can grow in many places, including the large intestine (colon). A polyp may be a round bump or a mushroom-shaped growth. You could have one polyp or several. Most colon polyps are noncancerous (benign). However, some colon polyps can become cancerous over time. Finding and removing the polyps early can help prevent this. What are the causes? The exact cause of colon polyps is not known. What increases the risk? You are more likely to develop this condition if you:  Have a family history of colon cancer or colon polyps.  Are older than 50  or older than 45 if you are African American.  Have inflammatory bowel disease, such as ulcerative colitis or Crohn's disease.  Have certain hereditary conditions, such as: ? Familial adenomatous polyposis. ? Lynch syndrome. ? Turcot syndrome. ? Peutz-Jeghers syndrome.  Are overweight.  Smoke cigarettes.  Do not get enough exercise.  Drink too much alcohol.  Eat a diet that is high in fat and red meat and low in fiber.  Had childhood cancer that was treated with abdominal radiation. What are the signs or symptoms? Most polyps do not cause symptoms. If you have symptoms, they may include:  Blood coming from your rectum when having a bowel movement.  Blood in your stool. The stool may look dark red or black.  Abdominal pain.  A change in bowel habits, such as constipation or diarrhea. How is this diagnosed? This condition is diagnosed with a colonoscopy. This is a procedure in which a lighted, flexible scope is inserted into the anus and then passed into the colon to examine the area. Polyps are sometimes found when a colonoscopy is done as part of routine cancer screening tests. How is this treated? Treatment for this condition involves removing any polyps that are found. Most polyps can be removed during a colonoscopy. Those polyps will then be tested for cancer. Additional treatment may be needed depending on the  results of testing. Follow these instructions at home: Lifestyle  Maintain a healthy weight, or lose weight if recommended by your health care provider.  Exercise every day or as told by your health care provider.  Do not use any products that contain nicotine or tobacco, such as cigarettes and e-cigarettes. If you need help quitting, ask your health care provider.  If you drink alcohol, limit how much you have: ? 0-1 drink a day for women. ? 0-2 drinks a day for men.  Be aware of how much alcohol is in your drink. In the [place] one drink equals one 12 oz bottle of beer (355 mL), one 5 oz glass of wine (148 mL), or one 1 oz shot of hard liquor (44 mL). Eating and drinking   Eat foods that are high in fiber, such as fruits, vegetables, and whole grains.  Eat foods that are high in calcium and vitamin D, such as milk, cheese, yogurt, eggs, liver, fish, and broccoli.  Limit foods that are high in fat, such as fried foods and desserts.  Limit the amount of red meat and processed meat you eat, such as hot dogs, sausage, bacon, and lunch meats. General instructions  Keep all follow-up visits as told by your health care provider. This is important. ? This includes having regularly scheduled colonoscopies. ? Talk to your health care provider about when you need a colonoscopy. Contact a health care provider if:  You have new or worsening bleeding during a bowel movement.  You have new or increased blood in your stool.  You have a change in bowel habits.  You lose weight for no known reason. Summary  Polyps are tissue growths inside the body. Polyps can grow in many places, including the colon.  Most colon polyps are noncancerous (benign), but some can become cancerous over time.  This condition is diagnosed with a colonoscopy.  Treatment for this condition involves removing any polyps that are found. Most polyps can be removed during a colonoscopy. This information is not  intended to replace advice given to you by your health care provider. Make sure you discuss any questions  you have with your health care provider. Document Released: 04/05/2004 Document Revised: 10/25/2017 Document Reviewed: 10/25/2017 Elsevier Interactive Patient Education  2019 [employer].  Diverticulosis  Diverticulosis is a condition that develops when small pouches (diverticula) form in the wall of the large intestine (colon). The colon is where water is absorbed and stool is formed. The pouches form when the inside layer of the colon pushes through weak spots in the outer layers of the colon. You may have a few pouches or many of them. What are the causes? The cause of this condition is not known. What increases the risk? The following factors may make you more likely to develop this condition:  Being older than age 6. Your risk for this condition increases with age. Diverticulosis is rare among people younger than age 28. By age 46, many people have it.  Eating a low-fiber diet.  Having frequent constipation.  Being overweight.  Not getting enough exercise.  Smoking.  Taking over-the-counter pain medicines, like aspirin and ibuprofen.  Having a family history of diverticulosis. What are the signs or symptoms? In most people, there are no symptoms of this condition. If you do have symptoms, they may include:  Bloating.  Cramps in the abdomen.  Constipation or diarrhea.  Pain in the lower left side of the abdomen. How is this diagnosed? This condition is most often diagnosed during an exam for other colon problems. Because diverticulosis usually has no symptoms, it often cannot be diagnosed independently. This condition may be diagnosed by:  Using a flexible scope to examine the colon (colonoscopy).  Taking an X-ray of the colon after dye has been put into the colon (barium enema).  Doing a CT scan. How is this treated? You may not need treatment for this  condition if you have never developed an infection related to diverticulosis. If you have had an infection before, treatment may include:  Eating a high-fiber diet. This may include eating more fruits, vegetables, and grains.  Taking a fiber supplement.  Taking a live bacteria supplement (probiotic).  Taking medicine to relax your colon.  Taking antibiotic medicines. Follow these instructions at home:  Drink 6-8 glasses of water or more each day to prevent constipation.  Try not to strain when you have a bowel movement.  If you have had an infection before: ? Eat more fiber as directed by your health care provider or your diet and nutrition specialist (dietitian). ? Take a fiber supplement or probiotic, if your health care provider approves.  Take over-the-counter and prescription medicines only as told by your health care provider.  If you were prescribed an antibiotic, take it as told by your health care provider. Do not stop taking the antibiotic even if you start to feel better.  Keep all follow-up visits as told by your health care provider. This is important. Contact a health care provider if:  You have pain in your abdomen.  You have bloating.  You have cramps.  You have not had a bowel movement in 3 days. Get help right away if:  Your pain gets worse.  Your bloating becomes very bad.  You have a fever or chills, and your symptoms suddenly get worse.  You vomit.  You have bowel movements that are bloody or black.  You have bleeding from your rectum. Summary  Diverticulosis is a condition that develops when small pouches (diverticula) form in the wall of the large intestine (colon).  You may have a few pouches or many  of them.  This condition is most often diagnosed during an exam for other colon problems.  If you have had an infection related to diverticulosis, treatment may include increasing the fiber in your diet, taking supplements, or taking  medicines. This information is not intended to replace advice given to you by your health care provider. Make sure you discuss any questions you have with your health care provider. Document Released: 04/06/2004 Document Revised: 05/29/2016 Document Reviewed: 05/29/2016 Elsevier Interactive Patient Education  2019 [employer]   Increase Prevacid to 30 mg twice daily for the next 3 months  Office visit with [place] in 3 months  Further recommendations to follow pending review of pathology report

## 2018-08-26 NOTE — H&P (Signed)
@LOGO @   Primary Care Physician:  Dorothyann Peng, NP Primary Gastroenterologist:  Dr. Gala Romney  Pre-Procedure History & Physical: HPI:  Daniel Guerrero is a 73 y.o. male here for further evaluation of dysphagia via EGD and surveillance colonoscopy due to history of multiple colonic adenomas removed previously.  Past Medical History:  Diagnosis Date  . Allergy   . BPH (benign prostatic hyperplasia)   . Cataract of both eyes    implants both eyes   . Eczema   . Enlarged aorta ([place])   . Erectile dysfunction   . Hearing loss    high frequency  . History of inguinal hernia repair, bilateral   . Hypertension   . Kidney stones   . Loss of hearing    high frequency  . Meniere disease   . Paroxysmal SVT (supraventricular tachycardia) ([place])   . Psoriasis   . S/P appendectomy   . S/P tonsillectomy   . Seasonal allergies   . SVT (supraventricular tachycardia) ([place])   . Type II or unspecified type diabetes mellitus without mention of complication, uncontrolled    12-29-14 Told he was prediabetic once    Past Surgical History:  Procedure Laterality Date  . Ablasion    . APPENDECTOMY    . CARDIAC ELECTROPHYSIOLOGY [place] AND ABLATION  2015  . CHOLECYSTECTOMY    . COLONOSCOPY     unsure where or when- states greater than 10 yrs ago per pt- was normal per pt and his wife   . CYSTOSCOPY WITH RETROGRADE PYELOGRAM, URETEROSCOPY AND STENT PLACEMENT Left 12/30/2014   Procedure: CYSTOSCOPY WITH LEFT URETEROSCOPY STONE EXTRACTION WITH STENT;  Surgeon: Franchot Gallo, MD;  [facility]: [facility];  Service: Urology;  Laterality: Left;  . ELECTROPHYSIOLOGIC STUDY N/A 08/24/2015   Procedure: SVT Ablation;  Surgeon: Thompson Grayer, MD;  Location: [facility];  Service: Cardiovascular;  Laterality: N/A;  . ENDOVENOUS ABLATION SAPHENOUS VEIN W/ LASER Right 12/04/2016   endovenous laser ablation right greater saphenous vein by Tinnie Gens MD   . ESOPHAGOGASTRODUODENOSCOPY N/A 08/18/2016   Procedure:  ESOPHAGOGASTRODUODENOSCOPY (EGD);  Surgeon: [name], MD;  [facility];  Service: Endoscopy;  Laterality: N/A;  . ESOPHAGOGASTRODUODENOSCOPY N/A 09/06/2016   Procedure: ESOPHAGOGASTRODUODENOSCOPY (EGD);  Surgeon: [name], MD;  [facility];  Service: Endoscopy;  Laterality: N/A;  100 - moved to 2/14 @ 2:15 per Tretha Sciara  . EYE SURGERY     Catracts removed both eye 30 yrs ago  . HERNIA REPAIR     right and left  . HOLMIUM LASER APPLICATION Left 07/28/4006   Procedure: HOLMIUM LASER APPLICATION;  Surgeon: Franchot Gallo, MD;  [facility];  Service: Urology;  Laterality: Left;  Marland Kitchen MALONEY DILATION N/A 09/06/2016   Procedure: Venia Minks DILATION;  Surgeon: [name], MD;  [facility];  Service: Endoscopy;  Laterality: N/A;  . TONSILLECTOMY      Prior to Admission medications   Medication Sig Start Date End Date Taking? Authorizing Provider  amLODipine (NORVASC) 5 MG tablet TAKE ONE TABLET BY MOUTH DAILY (DUE FOR PHYSICAL WITH CORY) Patient taking differently: Take 5 mg by mouth daily. TAKE ONE TABLET BY MOUTH DAILY (DUE FOR PHYSICAL WITH CORY) 02/27/18  Yes Nafziger, [name], NP  cetirizine (ZYRTEC) 10 MG tablet Take 10 mg by mouth daily as needed for allergies.    Yes [provider]  Cyanocobalamin (VITAMIN B-12 PO) Take 1 tablet by mouth daily.    Yes [provider]  desonide (DESOWEN) 0.05 % cream Apply 1 application topically daily as needed (rash).    Yes [provider]  fluticasone (FLONASE) 50 MCG/ACT nasal spray Place 2 sprays into both nostrils daily as needed for allergies or rhinitis. 05/30/18  Yes Nafziger, [name], NP  hydrocortisone cream 1 % Apply 1 application topically daily as needed for itching.   Yes [provider]  lansoprazole (PREVACID) 30 MG capsule Take 1 capsule (30 mg total) by mouth daily. 03/20/17  Yes Stuart Guillen, Cristopher Estimable, MD  Simethicone (GAS-X PO) Take 1 tablet by mouth daily as needed.     Yes [provider]  tamsulosin (FLOMAX) 0.4 MG CAPS capsule Take 2 capsules (0.8 mg total) by mouth daily. **DUE FOR PHYSICAL** 08/20/18  Yes Nafziger, [name], NP  vitamin C (ASCORBIC ACID) 500 MG tablet Take 500 mg by mouth daily.   Yes [provider]    Allergies as of 07/02/2018 - Review Complete 07/02/2018  Allergen Reaction Noted  . Peanut-containing drug products Hives 12/31/2009  . Penicillins Hives 03/11/2008  . Quinolones  04/11/2018    Family History  Problem Relation Age of Onset  . Cancer Mother        lung  . Thyroid disease Mother   . Diabetes Father   . GI Bleed Father   . Aneurysm Father   . AAA (abdominal aortic aneurysm) Father   . Colon cancer Neg Hx   . Colon polyps Neg Hx   . Rectal cancer Neg Hx   . Stomach cancer Neg Hx     Social History   Socioeconomic History  . Marital status: Married    Spouse name: Not on file  . Number of children: 3  . Years of education: Not on file  . Highest education level: Not on file  Occupational History  . Occupation: professor  Social Needs  . Financial resource strain: Not hard at all  . Food insecurity:    Worry: Never true    Inability: Never true  . Transportation needs:    Medical: No    Non-medical: No  Tobacco Use  . Smoking status: Former Smoker    Last attempt to quit: 07/24/1964    Years since quitting: 54.1  . Smokeless tobacco: Never Used  . Tobacco comment: Quit 20-30 years ago; smoked pipe and cigars   Substance and Sexual Activity  . Alcohol use: Yes    Alcohol/week: 7.0 standard drinks    Types: 2 Cans of beer, 4 Shots of liquor, 1 Glasses of wine per week    Comment: couple beers/day  . Drug use: No  . Sexual activity: Not on file  Lifestyle  . Physical activity:    Days per week: 0 days    Minutes per session: 0 min  . Stress: Not at all  Relationships  . Social connections:    Talks on phone: Twice a week    Gets together: Once a week    Attends religious  service: Never    Active member of club or organization: Yes    Attends meetings of clubs or organizations: More than 4 times per year    Relationship status: Married  . Intimate partner violence:    Fear of current or ex partner: No    Emotionally abused: No    Physically abused: No    Forced sexual activity: No  Other Topics Concern  . Not on file  Social History Narrative   Daily Caffeine use: 5-6 drinks daily  08/07/2018: No drug use, some daily alcohol use at night but patient does not feel like it disrupts his health or is a safety concern.       Lives with wife in two story home; three sons, five grandchildren in [place] area.   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              Review of Systems: See HPI, otherwise negative ROS  Physical Exam: BP 103/70   Pulse (!) 55   Temp (!) 97.4 F (36.3 C) (Oral)   Resp 18   Ht 5\' 11"  (1.803 m)   Wt 99.8 kg   SpO2 99%   BMI 30.69 kg/m  General:   Alert,  Well-developed, well-nourished, pleasant and cooperative in NAD Neck:  Supple; no masses or thyromegaly. No significant cervical adenopathy. Lungs:  Clear throughout to auscultation.   No wheezes, crackles, or rhonchi. No acute distress. Heart:  Regular rate and rhythm; no murmurs, clicks, rubs,  or gallops. Abdomen: Non-distended, normal bowel sounds.  Soft and nontender without appreciable mass or hepatosplenomegaly.  Pulses:  Normal pulses noted. Extremities:  Without clubbing or edema.  Impression/Plan: 73 year old gentleman longstanding GERD now with esophageal dysphagia-recurrent.  History of Schatzki's ring.  History of multiple colonic adenomas removed previously.  Above offer the patient a EGD with ED and colonoscopy per plan today.  The risks, benefits, limitations, imponderables and alternatives regarding both EGD and colonoscopy have been reviewed with the patient. Questions have been  answered. All parties agreeable.   Notice: This dictation was prepared with Dragon dictation along with smaller phrase technology. Any transcriptional errors that result from this process are unintentional and may not be corrected upon review.

## 2018-08-27 ENCOUNTER — Encounter: Payer: Self-pay | Admitting: Internal Medicine

## 2018-08-30 ENCOUNTER — Encounter (HOSPITAL_COMMUNITY): Payer: Self-pay | Admitting: Internal Medicine

## 2018-09-21 ENCOUNTER — Other Ambulatory Visit: Payer: Self-pay | Admitting: Adult Health

## 2018-09-21 DIAGNOSIS — I10 Essential (primary) hypertension: Secondary | ICD-10-CM

## 2018-09-24 NOTE — Telephone Encounter (Signed)
Sent to the pharmacy by e-scribe for 90 days.  Pt has upcoming appt on 10/30/2018 for cpx.  Nothing further needed.

## 2018-10-03 ENCOUNTER — Other Ambulatory Visit: Payer: Self-pay | Admitting: Adult Health

## 2018-10-03 DIAGNOSIS — N401 Enlarged prostate with lower urinary tract symptoms: Principal | ICD-10-CM

## 2018-10-03 DIAGNOSIS — N138 Other obstructive and reflux uropathy: Secondary | ICD-10-CM

## 2018-10-04 NOTE — Telephone Encounter (Signed)
Sent to the pharmacy by e-scribe. 

## 2018-10-04 NOTE — Telephone Encounter (Signed)
I see pt is taking tamsulosin.  Is pt also on this?

## 2018-10-04 NOTE — Telephone Encounter (Signed)
Yes, he should be taking both

## 2018-10-30 ENCOUNTER — Encounter: Payer: Medicare Other | Admitting: Adult Health

## 2018-11-26 ENCOUNTER — Encounter: Payer: Self-pay | Admitting: Internal Medicine

## 2018-11-26 ENCOUNTER — Ambulatory Visit (INDEPENDENT_AMBULATORY_CARE_PROVIDER_SITE_OTHER): Payer: Medicare Other | Admitting: Nurse Practitioner

## 2018-11-26 ENCOUNTER — Other Ambulatory Visit: Payer: Self-pay

## 2018-11-26 ENCOUNTER — Encounter: Payer: Self-pay | Admitting: Nurse Practitioner

## 2018-11-26 DIAGNOSIS — Z860100 Personal history of colon polyps, unspecified: Secondary | ICD-10-CM

## 2018-11-26 DIAGNOSIS — R1319 Other dysphagia: Secondary | ICD-10-CM

## 2018-11-26 DIAGNOSIS — K219 Gastro-esophageal reflux disease without esophagitis: Secondary | ICD-10-CM

## 2018-11-26 DIAGNOSIS — R1310 Dysphagia, unspecified: Secondary | ICD-10-CM

## 2018-11-26 HISTORY — DX: Personal history of colonic polyps: Z86.010

## 2018-11-26 HISTORY — DX: Gastro-esophageal reflux disease without esophagitis: K21.9

## 2018-11-26 HISTORY — DX: Personal history of colon polyps, unspecified: Z86.0100

## 2018-11-26 NOTE — Assessment & Plan Note (Signed)
Dysphagia symptoms have resolved after EGD with dilation.  Recommend he continue to monitor notify us of any recurrent symptoms.  Follow-up in 6 months.  Continue PPI management as per above.

## 2018-11-26 NOTE — Assessment & Plan Note (Signed)
History of GERD generally well managed on Prevacid.  Continues to do well on this without breakthrough symptoms.  Dysphagia resolved, as per below after EGD with dilation.  No concerning findings on EGD otherwise.  Continue to monitor, follow-up in 6 months.

## 2018-11-26 NOTE — Assessment & Plan Note (Signed)
History of colon polyps on recent colonoscopy.  Recommended repeat colonoscopy in 5 years, if overall health permits.  He is on the recall list for this.  In 5 years we can decide if we want to proceed or not.  Call with any questions, follow-up in 6 months.

## 2018-11-26 NOTE — Patient Instructions (Signed)
Your health issues we discussed today were:   GERD (reflux/heartburn) and dysphasia ("swallowing issues "): 1. I am glad your swallowing issues have gotten better 2. Continue your acid blocker as it seems to be working well for your GERD 3. Let us know if you have any worsening GERD or any returning swallowing issues  Colon polyps: 1. We will put you on a "recall" and notify you in 5 years when it is time to repeat your colonoscopy 2. At that point we can decide if we want to proceed with a colonoscopy, depending on your overall health status 3. Notify us if you have any concerning symptoms such as significant rectal bleeding  Overall I recommend:  1. Continue your other current medications 2. Call us if you have any questions or concerns 3. Return for follow-up in 6 months.   Because of recent events of COVID-19 ("Coronavirus"), follow CDC recommendations:  1. Wash your hand frequently 2. Avoid touching your face 3. Stay away from people who are sick 4. If you have symptoms such as fever, cough, shortness of breath then call your healthcare provider for further guidance 5. If you are sick, STAY AT HOME unless otherwise directed by your healthcare provider. 6. Follow directions from state and national officials regarding staying safe   At [facility] we value your feedback. You may receive a survey about your visit today. Please share your experience as we strive to create trusting relationships with our patients to provide genuine, compassionate, quality care.  We appreciate your understanding and patience as we review any laboratory studies, imaging, and other diagnostic tests that are ordered as we care for you. Our office policy is 5 business days for review of these results, and any emergent or urgent results are addressed in a timely manner for your best interest. If you do not hear from our office in 1 week, please contact us.   We also encourage the use of  MyChart, which contains your medical information for your review as well. If you are not enrolled in this feature, an access code is on this after visit summary for your convenience. Thank you for allowing [place] to be involved in your care.  It was great to see you today!  I hope you have a great day!!

## 2018-11-26 NOTE — Progress Notes (Signed)
Referring Provider: Dorothyann Peng, NP Primary Care Physician:  Dorothyann Peng, NP Primary GI:  Dr. Gala Romney  NOTE: Service was provided via telemedicine and was requested by the patient due to COVID-19 pandemic.  Method of visit: [email or web address]  Patient Location: Home  Provider Location: Office  Reason for Phone Visit: Follow-up  The patient was consented to phone follow-up via telephone encounter including billing of the encounter (yes/no): Yes  Persons present on the phone encounter, with roles: Wife  Total time (minutes) spent on medical discussion: 17 minutes  Chief Complaint  Patient presents with  . Follow-up    F/U from EGD and TCS    HPI:   Daniel Guerrero is a 73 y.o. male who presents for virtual visit regarding: Post procedure follow-up.  The patient was last seen in our office 07/02/2018 for dysphasia, GERD, history of colon polyps.  In October 2019 had cholecystectomy due to acute cholecystitis (gangrenous cholecystitis).  GERD well controlled on Prevacid, multiple tubular adenomas and sessile serrated polyps removed 3 years prior.  Due for surveillance.  Insidious dysphagia.  Recommended colonoscopy and upper endoscopy with esophageal dilation.  Continue current medications.  Colonoscopy completed 08/26/2018 which found diverticulosis in the entire examined colon, two 4 to 6 mm polyps in the descending colon, otherwise normal.  Surgical pathology, polyps to be tubular adenoma.  EGD completed the same day found mild Schatzki's ring status post dilation and disruption, small hiatal hernia, otherwise normal.  Recommended continue current medications, repeat colonoscopy in 5 years only if overall health permits.  Today he states he's doing well overall. His dysphagia has resolved. GERD doing well on PPI. Still on Prevacid. Procedure went well. Denies abdominal pain, N/V, hematochezia, melena, fever, chills, unintentional weight loss. He was having some brief post-cholecystestomy  hematochezia but he improved your diet and bleeding resolved. Stools are ok "if I eat right" and if he doesn't eat right, he has lots of loose stools. He has gas regardless. His bowel changes post-surgery are improving. Denies URI and flu-like symptoms. Denies chest pain, dyspnea, dizziness, lightheadedness, syncope, near syncope. Denies any other upper or lower GI symptoms.  Past Medical History:  Diagnosis Date  . Allergy   . BPH (benign prostatic hyperplasia)   . Cataract of both eyes    implants both eyes   . Eczema   . Enlarged aorta ([place])   . Erectile dysfunction   . Hearing loss    high frequency  . History of inguinal hernia repair, bilateral   . Hypertension   . Kidney stones   . Loss of hearing    high frequency  . Meniere disease   . Paroxysmal SVT (supraventricular tachycardia) ([place])   . Psoriasis   . S/P appendectomy   . S/P tonsillectomy   . Seasonal allergies   . SVT (supraventricular tachycardia) ([place])   . Type II or unspecified type diabetes mellitus without mention of complication, uncontrolled    12-29-14 Told he was prediabetic once    Past Surgical History:  Procedure Laterality Date  . Ablasion    . APPENDECTOMY    . CARDIAC ELECTROPHYSIOLOGY [place] AND ABLATION  2015  . CHOLECYSTECTOMY    . COLONOSCOPY     unsure where or when- states greater than 10 yrs ago per pt- was normal per pt and his wife   . COLONOSCOPY WITH PROPOFOL N/A 08/26/2018   Procedure: COLONOSCOPY WITH PROPOFOL;  Surgeon: [name], MD;  [facility];  Service:  Endoscopy;  Laterality: N/A;  10:00am  . CYSTOSCOPY WITH RETROGRADE PYELOGRAM, URETEROSCOPY AND STENT PLACEMENT Left 12/30/2014   Procedure: CYSTOSCOPY WITH LEFT URETEROSCOPY STONE EXTRACTION WITH STENT;  Surgeon: Franchot Gallo, MD;  [facility];  Service: Urology;  Laterality: Left;  . ELECTROPHYSIOLOGIC STUDY N/A 08/24/2015   Procedure: SVT Ablation;  Surgeon: Thompson Grayer, MD;  Location: [facility];  Service: Cardiovascular;  Laterality: N/A;  . ENDOVENOUS ABLATION SAPHENOUS VEIN W/ LASER Right 12/04/2016   endovenous laser ablation right greater saphenous vein by Tinnie Gens MD   . ESOPHAGOGASTRODUODENOSCOPY N/A 08/18/2016   Procedure: ESOPHAGOGASTRODUODENOSCOPY (EGD);  Surgeon: [name], MD;  [facility];  Service: Endoscopy;  Laterality: N/A;  . ESOPHAGOGASTRODUODENOSCOPY N/A 09/06/2016   Procedure: ESOPHAGOGASTRODUODENOSCOPY (EGD);  Surgeon: [name], MD;  [facility];  Service: Endoscopy;  Laterality: N/A;  100 - moved to 2/14 @ 2:15 per Tretha Sciara  . ESOPHAGOGASTRODUODENOSCOPY (EGD) WITH PROPOFOL N/A 08/26/2018   Procedure: ESOPHAGOGASTRODUODENOSCOPY (EGD) WITH PROPOFOL;  Surgeon: [name], MD;  [facility];  Service: Endoscopy;  Laterality: N/A;  . EYE SURGERY     Catracts removed both eye 30 yrs ago  . HERNIA REPAIR     right and left  . HOLMIUM LASER APPLICATION Left 12/24/2295   Procedure: HOLMIUM LASER APPLICATION;  Surgeon: Franchot Gallo, MD;  [facility];  Service: Urology;  Laterality: Left;  Marland Kitchen MALONEY DILATION N/A 09/06/2016   Procedure: Venia Minks DILATION;  Surgeon: [name], MD;  [facility];  Service: Endoscopy;  Laterality: N/A;  Venia Minks DILATION N/A 08/26/2018   Procedure: Venia Minks DILATION;  Surgeon: [name], MD;  [facility];  Service: Endoscopy;  Laterality: N/A;  . POLYPECTOMY  08/26/2018   Procedure: POLYPECTOMY;  Surgeon: [name], MD;  [facility];  Service: Endoscopy;;  polyp at descending colon x2  . TONSILLECTOMY      Current Outpatient Medications  Medication Sig Dispense Refill  . amLODipine (NORVASC) 5 MG tablet TAKE 1 TABLET BY MOUTH EVERY DAY (due FOR physical) 90 tablet 0  . cetirizine (ZYRTEC) 10 MG tablet Take 10 mg by mouth daily as needed for allergies.     . Cyanocobalamin (VITAMIN B-12 PO) Take 1 tablet by mouth daily.     Marland Kitchen  desonide (DESOWEN) 0.05 % cream Apply 1 application topically daily as needed (rash).     Marland Kitchen dutasteride (AVODART) 0.5 MG capsule TAKE ONE CAPSULE BY MOUTH DAILY 90 capsule 0  . fluticasone (FLONASE) 50 MCG/ACT nasal spray Place 2 sprays into both nostrils daily as needed for allergies or rhinitis. 16 g 2  . hydrocortisone cream 1 % Apply 1 application topically daily as needed for itching.    . lansoprazole (PREVACID) 30 MG capsule Take 1 capsule (30 mg total) by mouth daily. 90 capsule 3  . Simethicone (GAS-X PO) Take 1 tablet by mouth daily as needed.     . tamsulosin (FLOMAX) 0.4 MG CAPS capsule Take 2 capsules (0.8 mg total) by mouth daily. **DUE FOR PHYSICAL** 60 capsule 0  . vitamin C (ASCORBIC ACID) 500 MG tablet Take 500 mg by mouth daily.     No current facility-administered medications for this visit.     Allergies as of 11/26/2018 - Review Complete 11/26/2018  Allergen Reaction Noted  . Peanut-containing drug products Hives 12/31/2009  . Penicillins Hives 03/11/2008  . Quinolones  04/11/2018    Family  History  Problem Relation Age of Onset  . Cancer Mother        lung  . Thyroid disease Mother   . Diabetes Father   . GI Bleed Father   . Aneurysm Father   . AAA (abdominal aortic aneurysm) Father   . Colon cancer Neg Hx   . Colon polyps Neg Hx   . Rectal cancer Neg Hx   . Stomach cancer Neg Hx     Social History   Socioeconomic History  . Marital status: Married    Spouse name: Not on file  . Number of children: 3  . Years of education: Not on file  . Highest education level: Not on file  Occupational History  . Occupation: professor  Social Needs  . Financial resource strain: Not hard at all  . Food insecurity:    Worry: Never true    Inability: Never true  . Transportation needs:    Medical: No    Non-medical: No  Tobacco Use  . Smoking status: Former Smoker    Last attempt to quit: 07/24/1964    Years since quitting: 54.3  . Smokeless tobacco: Never  Used  . Tobacco comment: Quit 20-30 years ago; smoked pipe and cigars   Substance and Sexual Activity  . Alcohol use: Yes    Alcohol/week: 4.0 standard drinks    Types: 1 Glasses of wine, 1 Cans of beer, 2 Shots of liquor per week    Comment: 4 drinks a week  . Drug use: No  . Sexual activity: Not on file  Lifestyle  . Physical activity:    Days per week: 0 days    Minutes per session: 0 min  . Stress: Not at all  Relationships  . Social connections:    Talks on phone: Twice a week    Gets together: Once a week    Attends religious service: Never    Active member of club or organization: Yes    Attends meetings of clubs or organizations: More than 4 times per year    Relationship status: Married  Other Topics Concern  . Not on file  Social History Narrative   Daily Caffeine use: 5-6 drinks daily      08/07/2018: No drug use, some daily alcohol use at night but patient does not feel like it disrupts his health or is a safety concern.       Lives with wife in two story home; three sons, five grandchildren in [place] area.   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              Review of Systems: General: Negative for anorexia, weight loss, fever, chills, fatigue, weakness. ENT: Negative for hoarseness, difficulty swallowing. CV: Negative for chest pain, angina, palpitations, peripheral edema.  Respiratory: Negative for dyspnea at rest, cough, sputum, wheezing.  GI: See history of present illness. Endo: Negative for unusual weight change.  Heme: Negative for bruising or bleeding. Allergy: Negative for rash or hives.  Physical Exam: Note: limited exam due to virtual visit General:   Alert and oriented. Pleasant and cooperative. Well-nourished and well-developed.  Head:  Normocephalic and atraumatic. Eyes:  Without icterus, sclera clear and conjunctiva pink.  Ears:  Normal auditory acuity. Skin:  Intact  without facial significant lesions or rashes. Neurologic:  Alert and oriented x4;  grossly normal neurologically. Psych:  Alert and cooperative. Normal mood and affect. Heme/Lymph/Immune: No excessive bruising  noted.

## 2018-11-27 NOTE — Progress Notes (Signed)
cc'ed to pcp °

## 2018-12-26 ENCOUNTER — Other Ambulatory Visit: Payer: Self-pay

## 2018-12-26 ENCOUNTER — Emergency Department (HOSPITAL_COMMUNITY)
Admission: EM | Admit: 2018-12-26 | Discharge: 2018-12-26 | Disposition: A | Discharge status: Home / Self Care | Payer: Medicare Other | Attending: Emergency Medicine | Admitting: Emergency Medicine

## 2018-12-26 ENCOUNTER — Encounter (HOSPITAL_COMMUNITY): Payer: Self-pay

## 2018-12-26 ENCOUNTER — Emergency Department (HOSPITAL_COMMUNITY): Payer: Medicare Other

## 2018-12-26 DIAGNOSIS — L509 Urticaria, unspecified: Secondary | ICD-10-CM | POA: Insufficient documentation

## 2018-12-26 DIAGNOSIS — I10 Essential (primary) hypertension: Secondary | ICD-10-CM | POA: Diagnosis not present

## 2018-12-26 DIAGNOSIS — Z79899 Other long term (current) drug therapy: Secondary | ICD-10-CM | POA: Insufficient documentation

## 2018-12-26 DIAGNOSIS — R0789 Other chest pain: Secondary | ICD-10-CM | POA: Insufficient documentation

## 2018-12-26 DIAGNOSIS — R21 Rash and other nonspecific skin eruption: Secondary | ICD-10-CM | POA: Diagnosis present

## 2018-12-26 DIAGNOSIS — Z87891 Personal history of nicotine dependence: Secondary | ICD-10-CM | POA: Diagnosis not present

## 2018-12-26 DIAGNOSIS — T7840XA Allergy, unspecified, initial encounter: Secondary | ICD-10-CM | POA: Insufficient documentation

## 2018-12-26 DIAGNOSIS — E119 Type 2 diabetes mellitus without complications: Secondary | ICD-10-CM | POA: Insufficient documentation

## 2018-12-26 DIAGNOSIS — L500 Allergic urticaria: Secondary | ICD-10-CM | POA: Diagnosis not present

## 2018-12-26 DIAGNOSIS — R079 Chest pain, unspecified: Secondary | ICD-10-CM | POA: Diagnosis not present

## 2018-12-26 LAB — CBC WITH DIFFERENTIAL/PLATELET
Abs Immature Granulocytes: 0.06 10*3/uL (ref 0.00–0.07)
Basophils Absolute: 0 10*3/uL (ref 0.0–0.1)
Basophils Relative: 0 %
Eosinophils Absolute: 0.2 10*3/uL (ref 0.0–0.5)
Eosinophils Relative: 2 %
HCT: 45.4 % (ref 39.0–52.0)
Hemoglobin: 15.4 g/dL (ref 13.0–17.0)
Immature Granulocytes: 1 %
Lymphocytes Relative: 8 %
Lymphs Abs: 0.8 10*3/uL (ref 0.7–4.0)
MCH: 32.7 pg (ref 26.0–34.0)
MCHC: 33.9 g/dL (ref 30.0–36.0)
MCV: 96.4 fL (ref 80.0–100.0)
Monocytes Absolute: 0.7 10*3/uL (ref 0.1–1.0)
Monocytes Relative: 7 %
Neutro Abs: 8.4 10*3/uL — ABNORMAL HIGH (ref 1.7–7.7)
Neutrophils Relative %: 82 %
Platelets: 187 10*3/uL (ref 150–400)
RBC: 4.71 MIL/uL (ref 4.22–5.81)
RDW: 13 % (ref 11.5–15.5)
WBC: 10 10*3/uL (ref 4.0–10.5)
nRBC: 0 % (ref 0.0–0.2)

## 2018-12-26 LAB — BASIC METABOLIC PANEL
Anion gap: 10 (ref 5–15)
BUN: 17 mg/dL (ref 8–23)
CO2: 21 mmol/L — ABNORMAL LOW (ref 22–32)
Calcium: 8.3 mg/dL — ABNORMAL LOW (ref 8.9–10.3)
Chloride: 106 mmol/L (ref 98–111)
Creatinine, Ser: 0.99 mg/dL (ref 0.61–1.24)
GFR calc Af Amer: >60 mL/min (ref >60)
GFR calc non Af Amer: >60 mL/min (ref >60)
Glucose, Bld: 147 mg/dL — ABNORMAL HIGH (ref 70–99)
Potassium: 3.5 mmol/L (ref 3.5–5.1)
Sodium: 137 mmol/L (ref 135–145)

## 2018-12-26 LAB — URINALYSIS, ROUTINE W REFLEX MICROSCOPIC
Bilirubin Urine: NEGATIVE
Glucose, UA: NEGATIVE mg/dL
Hgb urine dipstick: NEGATIVE
Ketones, ur: NEGATIVE mg/dL
Leukocytes,Ua: NEGATIVE
Nitrite: NEGATIVE
Protein, ur: NEGATIVE mg/dL
Specific Gravity, Urine: 1.032 — ABNORMAL HIGH (ref 1.005–1.030)
pH: 5 (ref 5.0–8.0)

## 2018-12-26 LAB — TROPONIN I
Troponin I: <0.03 ng/mL (ref <0.03)
Troponin I: <0.03 ng/mL (ref <0.03)

## 2018-12-26 MED ORDER — METHYLPREDNISOLONE SODIUM SUCC 125 MG IJ SOLR
125.0000 mg | Freq: Once | INTRAMUSCULAR | Status: AC
  Administered 2018-12-26: 125 mg via INTRAVENOUS
  Filled 2018-12-26: qty 2

## 2018-12-26 MED ORDER — FAMOTIDINE IN NACL 20-0.9 MG/50ML-% IV SOLN
20.0000 mg | Freq: Once | INTRAVENOUS | Status: AC
  Administered 2018-12-26: 20 mg via INTRAVENOUS
  Filled 2018-12-26: qty 50

## 2018-12-26 MED ORDER — PREDNISONE 20 MG PO TABS: ORAL_TABLET | ORAL | 0 refills | Status: DC

## 2018-12-26 MED ORDER — DIPHENHYDRAMINE HCL 50 MG/ML IJ SOLN
25.0000 mg | Freq: Once | INTRAMUSCULAR | Status: AC
  Administered 2018-12-26: 25 mg via INTRAVENOUS
  Filled 2018-12-26: qty 1

## 2018-12-26 NOTE — ED Provider Notes (Signed)
[facility] EMERGENCY DEPARTMENT Provider Note   CSN: [id number] Arrival date & time: 12/26/18  0501    History   Chief Complaint Chief Complaint  Patient presents with  . Rash    HPI Daniel Guerrero is a 73 y.o. male.   The history is provided by the patient.  He has history of hypertension, diabetes and comes in because of a pruritic rash.  He states that he had a subjective fever last night when he was going to bed.  His wife gave him a dose of acetaminophen and aspirin.  He woke up during the night with itching of his torso and noticed that he had a rash.  He has not done anything to treat the rash, but it does seem to be fading now.  He continues to have itching.  He did develop a tight feeling in his chest which resolved shortly after arriving in the ED.  There was some mild dyspnea but no nausea or diaphoresis.  He is a non-smoker.  He did have a mild cough yesterday, but he attributes that to having mowed the lawn.  He denies any arthralgias or myalgias.  Past Medical History:  Diagnosis Date  . Allergy   . BPH (benign prostatic hyperplasia)   . Cataract of both eyes    implants both eyes   . Eczema   . Enlarged aorta ([place])   . Erectile dysfunction   . Hearing loss    high frequency  . History of inguinal hernia repair, bilateral   . Hypertension   . Kidney stones   . Loss of hearing    high frequency  . Meniere disease   . Paroxysmal SVT (supraventricular tachycardia) ([place])   . Psoriasis   . S/P appendectomy   . S/P tonsillectomy   . Seasonal allergies   . SVT (supraventricular tachycardia) ([place])   . Type II or unspecified type diabetes mellitus without mention of complication, uncontrolled    12-29-14 Told he was prediabetic once    Patient Active Problem List   Diagnosis Date Noted  . GERD (gastroesophageal reflux disease) 11/26/2018  . History of colonic polyps 11/26/2018  . Asymmetrical sensorineural hearing loss 02/06/2018  . BPH (benign prostatic  hyperplasia) 07/27/2017  . Headache 12/19/2016  . Varicose veins of right lower extremity with complications 26/71/2458  . Hyperglycemia 04/14/2016  . Hyperlipidemia 04/14/2016  . Sinus bradycardia 06/10/2015  . Asthma with acute exacerbation 11/09/2014  . Benign paroxysmal positional vertigo 04/16/2014  . LLQ pain 01/15/2014  . Hypertension   . Eczema   . Erectile dysfunction   . Paroxysmal SVT (supraventricular tachycardia) ([place]) 11/11/2012  . Headache, common migraine, intractable 09/09/2010  . Dysphagia 08/04/2010  . Allergic rhinitis 08/01/2010  . Overweight 12/13/2007  . MIXED HEARING LOSS BILATERAL 12/13/2007  . CONTACT DERMATITIS&OTH ECZEMA DUE OTH [place] AGENT 12/13/2007  . PSORIASIS 12/13/2007    Past Surgical History:  Procedure Laterality Date  . Ablasion    . APPENDECTOMY    . CARDIAC ELECTROPHYSIOLOGY [place] AND ABLATION  2015  . CHOLECYSTECTOMY    . COLONOSCOPY     unsure where or when- states greater than 10 yrs ago per pt- was normal per pt and his wife   . COLONOSCOPY WITH PROPOFOL N/A 08/26/2018   Procedure: COLONOSCOPY WITH PROPOFOL;  Surgeon: [name], MD;  Location: [facility];  Service: Endoscopy;  Laterality: N/A;  10:00am  . CYSTOSCOPY WITH RETROGRADE PYELOGRAM, URETEROSCOPY AND STENT PLACEMENT Left 12/30/2014  Procedure: CYSTOSCOPY WITH LEFT URETEROSCOPY STONE EXTRACTION WITH STENT;  Surgeon: Franchot Gallo, MD;  Location: [facility];  Service: Urology;  Laterality: Left;  . ELECTROPHYSIOLOGIC STUDY N/A 08/24/2015   Procedure: SVT Ablation;  Surgeon: Thompson Grayer, MD;  Location: [place] [facility];  Service: Cardiovascular;  Laterality: N/A;  . ENDOVENOUS ABLATION SAPHENOUS VEIN W/ LASER Right 12/04/2016   endovenous laser ablation right greater saphenous vein by Tinnie Gens MD   . ESOPHAGOGASTRODUODENOSCOPY N/A 08/18/2016   Procedure: ESOPHAGOGASTRODUODENOSCOPY (EGD);  Surgeon: [name], MD;  [facility];  Service: Endoscopy;   Laterality: N/A;  . ESOPHAGOGASTRODUODENOSCOPY N/A 09/06/2016   Procedure: ESOPHAGOGASTRODUODENOSCOPY (EGD);  Surgeon: [name], MD;  [facility];  Service: Endoscopy;  Laterality: N/A;  100 - moved to 2/14 @ 2:15 per Tretha Sciara  . ESOPHAGOGASTRODUODENOSCOPY (EGD) WITH PROPOFOL N/A 08/26/2018   Procedure: ESOPHAGOGASTRODUODENOSCOPY (EGD) WITH PROPOFOL;  Surgeon: [name], MD;  [facility];  Service: Endoscopy;  Laterality: N/A;  . EYE SURGERY     Catracts removed both eye 30 yrs ago  . HERNIA REPAIR     right and left  . HOLMIUM LASER APPLICATION Left 0/12/3014   Procedure: HOLMIUM LASER APPLICATION;  Surgeon: Franchot Gallo, MD;  Location: [facility];  Service: Urology;  Laterality: Left;  Marland Kitchen MALONEY DILATION N/A 09/06/2016   Procedure: Venia Minks DILATION;  Surgeon: [name], MD;  [facility];  Service: Endoscopy;  Laterality: N/A;  Venia Minks DILATION N/A 08/26/2018   Procedure: Venia Minks DILATION;  Surgeon: [name], MD;  [facility];  Service: Endoscopy;  Laterality: N/A;  . POLYPECTOMY  08/26/2018   Procedure: POLYPECTOMY;  Surgeon: [name], MD;  [facility];  Service: Endoscopy;;  polyp at descending colon x2  . TONSILLECTOMY          Home Medications    Prior to Admission medications   Medication Sig Start Date End Date Taking? Authorizing Provider  amLODipine (NORVASC) 5 MG tablet TAKE 1 TABLET BY MOUTH EVERY DAY (due FOR physical) 09/24/18   Nafziger, [name], NP  cetirizine (ZYRTEC) 10 MG tablet Take 10 mg by mouth daily as needed for allergies.     [provider]  Cyanocobalamin (VITAMIN B-12 PO) Take 1 tablet by mouth daily.     [provider]  desonide (DESOWEN) 0.05 % cream Apply 1 application topically daily as needed (rash).     [provider]  dutasteride (AVODART) 0.5 MG capsule TAKE ONE CAPSULE BY MOUTH DAILY 10/04/18   Nafziger, [name], NP  fluticasone (FLONASE) 50  MCG/ACT nasal spray Place 2 sprays into both nostrils daily as needed for allergies or rhinitis. 05/30/18   Nafziger, [name], NP  hydrocortisone cream 1 % Apply 1 application topically daily as needed for itching.    [provider]  lansoprazole (PREVACID) 30 MG capsule Take 1 capsule (30 mg total) by mouth daily. 03/20/17   Rourk, Cristopher Estimable, MD  Simethicone (GAS-X PO) Take 1 tablet by mouth daily as needed.     [provider]  tamsulosin (FLOMAX) 0.4 MG CAPS capsule Take 2 capsules (0.8 mg total) by mouth daily. **DUE FOR PHYSICAL** 08/20/18   Nafziger, [name], NP  vitamin C (ASCORBIC ACID) 500 MG tablet Take 500 mg by mouth daily.    [provider]    Family History Family History  Problem Relation Age of Onset  . Cancer Mother  lung  . Thyroid disease Mother   . Diabetes Father   . GI Bleed Father   . Aneurysm Father   . AAA (abdominal aortic aneurysm) Father   . Colon cancer Neg Hx   . Colon polyps Neg Hx   . Rectal cancer Neg Hx   . Stomach cancer Neg Hx     Social History Social History   Tobacco Use  . Smoking status: Former Smoker    Last attempt to quit: 07/24/1964    Years since quitting: 54.4  . Smokeless tobacco: Never Used  . Tobacco comment: Quit 20-30 years ago; smoked pipe and cigars   Substance Use Topics  . Alcohol use: Yes    Alcohol/week: 4.0 standard drinks    Types: 1 Glasses of wine, 1 Cans of beer, 2 Shots of liquor per week    Comment: 4 drinks a week  . Drug use: No     Allergies   Peanut-containing drug products; Penicillins; and Quinolones   Review of Systems Review of Systems  All other systems reviewed and are negative.    Physical Exam Updated Vital Signs BP 132/68 (BP Location: Right Arm)   Pulse 70   Temp 99 F (37.2 C)   Resp 18   Ht 5\' 10"  (1.778 m)   Wt 90.7 kg   SpO2 96%   BMI 28.70 kg/m   Physical Exam Vitals signs and nursing note reviewed.    73 year old male, resting comfortably and  in no acute distress. Vital signs are normal. Oxygen saturation is 96%, which is normal. Head is normocephalic and atraumatic. PERRLA, EOMI. Oropharynx is clear.  There is no edema of the uvula, no pooling of secretions.  Phonation is normal. Neck is nontender and supple without adenopathy or JVD. Back is nontender and there is no CVA tenderness. Lungs are clear without rales, wheezes, or rhonchi. Chest is nontender. Heart has regular rate and rhythm without murmur. Abdomen is soft, flat, nontender without masses or hepatosplenomegaly and peristalsis is normoactive. Extremities have no cyanosis or edema, full range of motion is present. Skin is warm and dry.  Urticarial rash is present over the trunk with large areas of confluence. Neurologic: Mental status is normal, cranial nerves are intact, there are no motor or sensory deficits.  ED Treatments / Results  Labs (all labs ordered are listed, but only abnormal results are displayed) Labs Reviewed  BASIC METABOLIC PANEL  TROPONIN I  CBC WITH DIFFERENTIAL/PLATELET  TROPONIN I    EKG None  Radiology No results found.  Procedures Procedures   Medications Ordered in ED Medications  diphenhydrAMINE (BENADRYL) injection 25 mg (has no administration in time range)  famotidine (PEPCID) IVPB 20 mg premix (has no administration in time range)  methylPREDNISolone sodium succinate (SOLU-MEDROL) 125 mg/2 mL injection 125 mg (has no administration in time range)     Initial Impression / Assessment and Plan / ED Course  I have reviewed the triage vital signs and the nursing notes.  Pertinent labs & imaging results that were available during my care of the patient were reviewed by me and considered in my medical decision making (see chart for details).  Urticaria of uncertain cause.  Chest tightness which may be related to urticaria, but need to consider cardiac disease.  Report of low-grade fever.  Temperature is normal now, but higher  than would be expected for this time of day.  Will check ECG and troponin and chest x-ray.  He is given  a dose of diphenhydramine, famotidine, methylprednisolone.  Will avoid giving additional aspirin at this point since it is possible that the urticaria could be an aspirin allergy.  Old records are reviewed, and he does have a prior ED visit for chest pain, and was also evaluated by cardiology.  CBC is normal.  Chest x-ray shows no acute process.  Remainder of labs are pending.  Case is signed out to Dr. Roderic Palau.  Final Clinical Impressions(s) / ED Diagnoses   Final diagnoses:  Urticaria  Chest discomfort    ED Discharge Orders    None       Delora Fuel, MD 17/40/81 [phone]

## 2018-12-26 NOTE — ED Triage Notes (Signed)
Pt woke up this am with a rash on his torso. Also said he has a fever last night, took an 81mg  aspirin. Did not take his temperature.  Took a benadryl this am for the rash .

## 2018-12-26 NOTE — Discharge Instructions (Addendum)
Take Benadryl for rash or itching.  Follow-up with your doctor next week for recheck 

## 2019-01-06 ENCOUNTER — Other Ambulatory Visit: Payer: Self-pay | Admitting: Adult Health

## 2019-01-06 DIAGNOSIS — N138 Other obstructive and reflux uropathy: Secondary | ICD-10-CM

## 2019-01-06 DIAGNOSIS — N401 Enlarged prostate with lower urinary tract symptoms: Secondary | ICD-10-CM

## 2019-01-07 NOTE — Telephone Encounter (Signed)
Sent to the pharmacy by e-scribe. 

## 2019-02-20 ENCOUNTER — Ambulatory Visit (INDEPENDENT_AMBULATORY_CARE_PROVIDER_SITE_OTHER): Payer: Medicare Other | Admitting: Adult Health

## 2019-02-20 ENCOUNTER — Other Ambulatory Visit: Payer: Self-pay

## 2019-02-20 ENCOUNTER — Encounter: Payer: Self-pay | Admitting: Adult Health

## 2019-02-20 VITALS — BP 142/80 | Temp 97.8°F | Ht 69.5 in | Wt 209.0 lb

## 2019-02-20 DIAGNOSIS — I10 Essential (primary) hypertension: Secondary | ICD-10-CM | POA: Diagnosis not present

## 2019-02-20 DIAGNOSIS — B356 Tinea cruris: Secondary | ICD-10-CM

## 2019-02-20 DIAGNOSIS — E782 Mixed hyperlipidemia: Secondary | ICD-10-CM | POA: Diagnosis not present

## 2019-02-20 DIAGNOSIS — Z1159 Encounter for screening for other viral diseases: Secondary | ICD-10-CM | POA: Diagnosis not present

## 2019-02-20 DIAGNOSIS — K219 Gastro-esophageal reflux disease without esophagitis: Secondary | ICD-10-CM | POA: Diagnosis not present

## 2019-02-20 DIAGNOSIS — Z0000 Encounter for general adult medical examination without abnormal findings: Secondary | ICD-10-CM | POA: Diagnosis not present

## 2019-02-20 DIAGNOSIS — N401 Enlarged prostate with lower urinary tract symptoms: Secondary | ICD-10-CM | POA: Diagnosis not present

## 2019-02-20 DIAGNOSIS — R3911 Hesitancy of micturition: Secondary | ICD-10-CM

## 2019-02-20 LAB — COMPREHENSIVE METABOLIC PANEL
ALT: 16 U/L (ref 0–53)
AST: 48 U/L — ABNORMAL HIGH (ref 0–37)
Albumin: 4.4 g/dL (ref 3.5–5.2)
Alkaline Phosphatase: 80 U/L (ref 39–117)
BUN: 17 mg/dL (ref 6–23)
CO2: 25 mEq/L (ref 19–32)
Calcium: 9.2 mg/dL (ref 8.4–10.5)
Chloride: 103 mEq/L (ref 96–112)
Creatinine, Ser: 0.89 mg/dL (ref 0.40–1.50)
GFR: 83.74 mL/min (ref >60.00)
Glucose, Bld: 119 mg/dL — ABNORMAL HIGH (ref 70–99)
Potassium: 4.2 mEq/L (ref 3.5–5.1)
Sodium: 138 mEq/L (ref 135–145)
Total Bilirubin: 1.3 mg/dL — ABNORMAL HIGH (ref 0.2–1.2)
Total Protein: 6.3 g/dL (ref 6.0–8.3)

## 2019-02-20 LAB — PSA: PSA: 2.06 ng/mL (ref 0.10–4.00)

## 2019-02-20 LAB — CBC WITH DIFFERENTIAL/PLATELET
Basophils Absolute: 0 10*3/uL (ref 0.0–0.1)
Basophils Relative: 0.6 % (ref 0.0–3.0)
Eosinophils Absolute: 0.1 10*3/uL (ref 0.0–0.7)
Eosinophils Relative: 1.3 % (ref 0.0–5.0)
HCT: 41.7 % (ref 39.0–52.0)
Hemoglobin: 14.3 g/dL (ref 13.0–17.0)
Lymphocytes Relative: 15.9 % (ref 12.0–46.0)
Lymphs Abs: 0.8 10*3/uL (ref 0.7–4.0)
MCHC: 34.4 g/dL (ref 30.0–36.0)
MCV: 96.4 fl (ref 78.0–100.0)
Monocytes Absolute: 0.4 10*3/uL (ref 0.1–1.0)
Monocytes Relative: 7.2 % (ref 3.0–12.0)
Neutro Abs: 3.8 10*3/uL (ref 1.4–7.7)
Neutrophils Relative %: 75 % (ref 43.0–77.0)
Platelets: 187 10*3/uL (ref 150.0–400.0)
RBC: 4.32 Mil/uL (ref 4.22–5.81)
RDW: 12.8 % (ref 11.5–15.5)
WBC: 5.1 10*3/uL (ref 4.0–10.5)

## 2019-02-20 LAB — TSH: TSH: 1.2 u[IU]/mL (ref 0.35–4.50)

## 2019-02-20 LAB — LIPID PANEL
Cholesterol: 169 mg/dL (ref 0–200)
HDL: 42.1 mg/dL (ref >39.00)
LDL Cholesterol: 99 mg/dL (ref 0–99)
NonHDL: 126.41
Total CHOL/HDL Ratio: 4
Triglycerides: 139 mg/dL (ref 0.0–149.0)
VLDL: 27.8 mg/dL (ref 0.0–40.0)

## 2019-02-20 LAB — HEMOGLOBIN A1C: Hgb A1c MFr Bld: 5.4 % (ref 4.6–6.5)

## 2019-02-20 MED ORDER — AMLODIPINE BESYLATE 5 MG PO TABS: ORAL_TABLET | ORAL | 3 refills | Status: DC

## 2019-02-20 MED ORDER — LANSOPRAZOLE 30 MG PO CPDR: 30.0000 mg | DELAYED_RELEASE_CAPSULE | Freq: Every day | ORAL | 3 refills | Status: DC

## 2019-02-20 MED ORDER — NYSTATIN 100000 UNIT/GM EX CREA: 1.0000 "application " | TOPICAL_CREAM | Freq: Two times a day (BID) | CUTANEOUS | 1 refills | Status: DC

## 2019-02-20 MED ORDER — TAMSULOSIN HCL 0.4 MG PO CAPS: 0.8000 mg | ORAL_CAPSULE | Freq: Every day | ORAL | 3 refills | Status: AC

## 2019-02-20 MED ORDER — FLUCONAZOLE 150 MG PO TABS: 150.0000 mg | ORAL_TABLET | Freq: Once | ORAL | 0 refills | Status: AC

## 2019-02-20 NOTE — Progress Notes (Signed)
Subjective:    Patient ID: Daniel Guerrero, male    DOB: June 15, 1946, 73 y.o.   MRN: [id number]  HPI Patient presents for yearly preventative medicine examination. He is a pleasant 73 year old male who  has a past medical history of Allergy, BPH (benign prostatic hyperplasia), Cataract of both eyes, Eczema, Enlarged aorta (HCC), Erectile dysfunction, Hearing loss, History of inguinal hernia repair, bilateral, Hypertension, Kidney stones, Loss of hearing, Meniere disease, Paroxysmal SVT (supraventricular tachycardia) ([place]), Psoriasis, S/P appendectomy, S/P tonsillectomy, Seasonal allergies, and SVT (supraventricular tachycardia) ([place]).  Essential Hypertension - takes Norvasc 5 mg.  He denies chest pain, shortness of breath, dizziness, lightheadedness, or syncopal episodes. He did not take his medication today.  BP Readings from Last 3 Encounters:  02/20/19 (!) 142/80  12/26/18 122/83  08/26/18 (!) 144/74   BPH with Nocturia -takes Flomax and Avodart. He reports that he is still getting up a few times a night to urinate.   GERD-controlled with Prevacid 30 mg daily  H/o AAA-is followed by Dr. Donnetta Hutching on a yearly basis.  Last CT scan of the abdomen was in October 2019 which showed his abdominal aortic aneurysm was 2.8 cm.  Watchful waiting for the time being  Hyperlipidemia - currently not on medication.  Lab Results  Component Value Date   CHOL 189 07/27/2017   HDL 39.60 07/27/2017   LDLCALC 119 (H) 07/27/2017   TRIG 151.0 (H) 07/27/2017   CHOLHDL 5 07/27/2017   Acute Issue  - He reports having a red itchy/burning rash in his groin and under his stomach. This has been present for about 10 days. He recently started using OTC antifungal cream but has not noticed much improved.    All immunizations and health maintenance protocols were reviewed with the patient and needed orders were placed. UTD  Appropriate screening laboratory values were ordered for the patient including screening of  hyperlipidemia, renal function and hepatic function. If indicated by BPH, a PSA was ordered.  Medication reconciliation,  past medical history, social history, problem list and allergies were reviewed in detail with the patient  Goals were established with regard to weight loss, exercise, and  diet in compliance with medications  Wt Readings from Last 3 Encounters:  02/20/19 209 lb (94.8 kg)  12/26/18 200 lb (90.7 kg)  08/26/18 220 lb 0.3 oz (99.8 kg)     End of life planning was discussed.   Review of Systems  Constitutional: Negative.   HENT: Positive for hearing loss.   Eyes: Negative.   Respiratory: Negative.   Cardiovascular: Negative.   Gastrointestinal: Negative.   Endocrine: Negative.   Genitourinary: Negative.   Musculoskeletal: Positive for arthralgias.  Skin: Positive for rash.  Allergic/Immunologic: Negative.   Neurological: Negative.   Hematological: Negative.   Psychiatric/Behavioral: Negative.   All other systems reviewed and are negative.  Past Medical History:  Diagnosis Date  . Allergy   . BPH (benign prostatic hyperplasia)   . Cataract of both eyes    implants both eyes   . Eczema   . Enlarged aorta ([place])   . Erectile dysfunction   . Hearing loss    high frequency  . History of inguinal hernia repair, bilateral   . Hypertension   . Kidney stones   . Loss of hearing    high frequency  . Meniere disease   . Paroxysmal SVT (supraventricular tachycardia) ([place])   . Psoriasis   . S/P appendectomy   . S/P tonsillectomy   .  Seasonal allergies   . SVT (supraventricular tachycardia) (HCC)     Social History   Socioeconomic History  . Marital status: Married    Spouse name: Not on file  . Number of children: 3  . Years of education: Not on file  . Highest education level: Not on file  Occupational History  . Occupation: professor  Social Needs  . Financial resource strain: Not hard at all  . Food insecurity    Worry: Never true     Inability: Never true  . Transportation needs    Medical: No    Non-medical: No  Tobacco Use  . Smoking status: Former Smoker    Quit date: 07/24/1964    Years since quitting: 54.6  . Smokeless tobacco: Never Used  . Tobacco comment: Quit 20-30 years ago; smoked pipe and cigars   Substance and Sexual Activity  . Alcohol use: Yes    Alcohol/week: 4.0 standard drinks    Types: 1 Glasses of wine, 1 Cans of beer, 2 Shots of liquor per week    Comment: 4 drinks a week  . Drug use: No  . Sexual activity: Not on file  Lifestyle  . Physical activity    Days per week: 0 days    Minutes per session: 0 min  . Stress: Not at all  Relationships  . Social Herbalist on phone: Twice a week    Gets together: Once a week    Attends religious service: Never    Active member of club or organization: Yes    Attends meetings of clubs or organizations: More than 4 times per year    Relationship status: Married  . Intimate partner violence    Fear of current or ex partner: No    Emotionally abused: No    Physically abused: No    Forced sexual activity: No  Other Topics Concern  . Not on file  Social History Narrative   Daily Caffeine use: 5-6 drinks daily      08/07/2018: No drug use, some daily alcohol use at night but patient does not feel like it disrupts his health or is a safety concern.       Lives with wife in two story home; three sons, five grandchildren in [place] area.   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              Past Surgical History:  Procedure Laterality Date  . Ablasion    . APPENDECTOMY    . CARDIAC ELECTROPHYSIOLOGY [place] AND ABLATION  2015  . CHOLECYSTECTOMY    . COLONOSCOPY     unsure where or when- states greater than 10 yrs ago per pt- was normal per pt and his wife   . COLONOSCOPY WITH PROPOFOL N/A 08/26/2018   Procedure: COLONOSCOPY WITH PROPOFOL;  Surgeon: [name],  MD;  [facility];  Service: Endoscopy;  Laterality: N/A;  10:00am  . CYSTOSCOPY WITH RETROGRADE PYELOGRAM, URETEROSCOPY AND STENT PLACEMENT Left 12/30/2014   Procedure: CYSTOSCOPY WITH LEFT URETEROSCOPY STONE EXTRACTION WITH STENT;  Surgeon: Franchot Gallo, MD;  Location: [facility];  Service: Urology;  Laterality: Left;  . ELECTROPHYSIOLOGIC STUDY N/A 08/24/2015   Procedure: SVT Ablation;  Surgeon: Thompson Grayer, MD;  Location: [facility];  Service: Cardiovascular;  Laterality: N/A;  . ENDOVENOUS ABLATION SAPHENOUS VEIN W/ LASER Right 12/04/2016   endovenous laser ablation right greater saphenous  vein by Tinnie Gens MD   . ESOPHAGOGASTRODUODENOSCOPY N/A 08/18/2016   Procedure: ESOPHAGOGASTRODUODENOSCOPY (EGD);  Surgeon: [name], MD;  [facility];  Service: Endoscopy;  Laterality: N/A;  . ESOPHAGOGASTRODUODENOSCOPY N/A 09/06/2016   Procedure: ESOPHAGOGASTRODUODENOSCOPY (EGD);  Surgeon: [name], MD;  [facility];  Service: Endoscopy;  Laterality: N/A;  100 - moved to 2/14 @ 2:15 per Tretha Sciara  . ESOPHAGOGASTRODUODENOSCOPY (EGD) WITH PROPOFOL N/A 08/26/2018   Procedure: ESOPHAGOGASTRODUODENOSCOPY (EGD) WITH PROPOFOL;  Surgeon: [name], MD;  [facility];  Service: Endoscopy;  Laterality: N/A;  . EYE SURGERY     Catracts removed both eye 30 yrs ago  . HERNIA REPAIR     right and left  . HOLMIUM LASER APPLICATION Left 11/29/5636   Procedure: HOLMIUM LASER APPLICATION;  Surgeon: Franchot Gallo, MD;  Location[facility];  Service: Urology;  Laterality: Left;  Marland Kitchen MALONEY DILATION N/A 09/06/2016   Procedure: Venia Minks DILATION;  Surgeon: [name], MD;  [facility];  Service: Endoscopy;  Laterality: N/A;  Venia Minks DILATION N/A 08/26/2018   Procedure: Venia Minks DILATION;  Surgeon: [name], MD;  [facility];  Service: Endoscopy;  Laterality: N/A;  . POLYPECTOMY  08/26/2018   Procedure: POLYPECTOMY;  Surgeon:  [name], MD;  [facility];  Service: Endoscopy;;  polyp at descending colon x2  . TONSILLECTOMY      Family History  Problem Relation Age of Onset  . Cancer Mother        lung  . Thyroid disease Mother   . Diabetes Father   . GI Bleed Father   . Aneurysm Father   . AAA (abdominal aortic aneurysm) Father   . Colon cancer Neg Hx   . Colon polyps Neg Hx   . Rectal cancer Neg Hx   . Stomach cancer Neg Hx     Allergies  Allergen Reactions  . Peanut-Containing Drug Products Hives  . Penicillins Hives    Has patient had a PCN reaction causing immediate rash, facial/tongue/throat swelling, SOB or lightheadedness with hypotension: No Has patient had a PCN reaction causing severe rash involving mucus membranes or skin necrosis: No Has patient had a PCN reaction that required hospitalization No Has patient had a PCN reaction occurring within the last 10 years: No If all of the above answers are "NO", then may proceed with Cephalosporin use.  . Quinolones     Patient was warned about not using Cipro and similar antibiotics. Recent studies have raised concern that fluoroquinolone antibiotics could be associated with an increased risk of aortic aneurysm Fluoroquinolones have non-antimicrobial properties that might jeopardise the integrity of the extracellular matrix of the vascular wall In a  propensity score matched cohort study in [place], there was a 66% increased rate of aortic aneurysm or dissection associated with oral fluoroquinolone use, compared wit    Current Outpatient Medications on File Prior to Visit  Medication Sig Dispense Refill  . amLODipine (NORVASC) 5 MG tablet TAKE 1 TABLET BY MOUTH EVERY DAY (due FOR physical) 90 tablet 0  . aspirin 325 MG tablet Take 325 mg by mouth every 4 (four) hours as needed for mild pain.    . cetirizine (ZYRTEC) 10 MG tablet Take 10 mg by mouth daily as needed for allergies.     Marland Kitchen CRANBERRY POWDER PO Take by mouth. 15,000  UNITS DAILY    . Cyanocobalamin (VITAMIN B-12 PO) Take 1 tablet by  mouth daily.     Marland Kitchen desonide (DESOWEN) 0.05 % cream Apply 1 application topically daily as needed (rash).     . diphenhydrAMINE HCl (BENADRYL ALLERGY PO) Take 1 tablet by mouth daily.    Marland Kitchen dutasteride (AVODART) 0.5 MG capsule TAKE ONE CAPSULE BY MOUTH DAILY 90 capsule 0  . fluticasone (FLONASE) 50 MCG/ACT nasal spray Place 2 sprays into both nostrils daily as needed for allergies or rhinitis. 16 g 2  . hydrocortisone cream 1 % Apply 1 application topically daily as needed for itching.    . lansoprazole (PREVACID) 30 MG capsule Take 1 capsule (30 mg total) by mouth daily. 90 capsule 3  . Simethicone (GAS-X PO) Take 1 tablet by mouth daily as needed.     . tamsulosin (FLOMAX) 0.4 MG CAPS capsule Take 2 capsules (0.8 mg total) by mouth daily. **DUE FOR PHYSICAL** 60 capsule 0  . vitamin C (ASCORBIC ACID) 500 MG tablet Take 500 mg by mouth daily.     No current facility-administered medications on file prior to visit.     BP (!) 142/80   Temp 97.8 F (36.6 C)   Ht 5' 9.5" (1.765 m)   Wt 209 lb (94.8 kg)   BMI 30.42 kg/m       Objective:   Physical Exam Vitals signs and nursing note reviewed.  Constitutional:      Appearance: Normal appearance. He is obese.  HENT:     Head: Normocephalic and atraumatic.     Right Ear: Tympanic membrane, ear canal and external ear normal. There is no impacted cerumen.     Left Ear: Tympanic membrane, ear canal and external ear normal. There is no impacted cerumen.     Nose: Nose normal. No congestion or rhinorrhea.     Mouth/Throat:     Mouth: Mucous membranes are moist.     Pharynx: Oropharynx is clear. No oropharyngeal exudate or posterior oropharyngeal erythema.  Eyes:     General:        Right eye: No discharge.        Left eye: No discharge.     Extraocular Movements: Extraocular movements intact.     Conjunctiva/sclera: Conjunctivae normal.     Pupils: Pupils are equal,  round, and reactive to light.  Cardiovascular:     Rate and Rhythm: Normal rate and regular rhythm.     Pulses: Normal pulses.     Heart sounds: Normal heart sounds. No murmur. No friction rub. No gallop.   Pulmonary:     Effort: Pulmonary effort is normal. No respiratory distress.     Breath sounds: Normal breath sounds. No stridor. No wheezing, rhonchi or rales.  Chest:     Chest wall: No tenderness.  Abdominal:     General: Abdomen is flat. Bowel sounds are normal. There is no distension.     Palpations: Abdomen is soft. There is no mass.     Tenderness: There is no abdominal tenderness. There is no right CVA tenderness, left CVA tenderness, guarding or rebound.     Hernia: No hernia is present.  Musculoskeletal: Normal range of motion.        General: No swelling, tenderness, deformity or signs of injury.     Right lower leg: No edema.     Left lower leg: No edema.  Skin:    General: Skin is warm and dry.     Capillary Refill: Capillary refill takes less than 2 seconds.     Coloration: Skin  is not jaundiced or pale.     Findings: Erythema and rash present. No bruising or lesion.     Comments: Erythematous, pruritic plaque in bilateral inguinal folds and under stomach fold. No vesicles noted  Neurological:     General: No focal deficit present.     Mental Status: He is alert and oriented to person, place, and time.  Psychiatric:        Mood and Affect: Mood normal.        Behavior: Behavior normal.        Thought Content: Thought content normal.        Judgment: Judgment normal.       Assessment & Plan:  1. Routine general medical examination at a health care facility - Encouraged weight loss through diet and exercise  - Follow up in one year or sooner if needed - CBC with Differential/Platelet - Comprehensive metabolic panel - Hemoglobin A1c - Lipid panel - TSH  2. Essential hypertension - No change in medications  - CBC with Differential/Platelet - Comprehensive  metabolic panel - Hemoglobin A1c - Lipid panel - TSH - amLODipine (NORVASC) 5 MG tablet; TAKE 1 TABLET BY MOUTH EVERY DAY (due FOR physical)  Dispense: 90 tablet; Refill: 3  3. Benign prostatic hyperplasia with urinary hesitancy  - PSA - tamsulosin (FLOMAX) 0.4 MG CAPS capsule; Take 2 capsules (0.8 mg total) by mouth daily.  Dispense: 180 capsule; Refill: 3  4. Mixed hyperlipidemia - Consider statin  - CBC with Differential/Platelet - Comprehensive metabolic panel - Hemoglobin A1c - Lipid panel - TSH  5. Encounter for hepatitis C screening test for low risk patient  - Hep C Antibody  6. Tinea cruris  - fluconazole (DIFLUCAN) 150 MG tablet; Take 1 tablet (150 mg total) by mouth once for 1 dose.  Dispense: 1 tablet; Refill: 0 - nystatin cream (MYCOSTATIN); Apply 1 application topically 2 (two) times daily.  Dispense: 30 g; Refill: 1  7. Gastroesophageal reflux disease without esophagitis - Well controlled. No change in medication  - lansoprazole (PREVACID) 30 MG capsule; Take 1 capsule (30 mg total) by mouth daily.  Dispense: 90 capsule; Refill: 3  Dorothyann Peng, NP

## 2019-02-21 LAB — HEPATITIS C ANTIBODY
Hepatitis C Ab: NONREACTIVE
SIGNAL TO CUT-OFF: 0.06 (ref <1.00)

## 2019-03-20 ENCOUNTER — Telehealth: Payer: Self-pay

## 2019-03-20 NOTE — Telephone Encounter (Signed)
Copied from [place] [id number]. Topic: General - Other >> Mar 20, 2019  2:32 PM Leward Quan A wrote: Reason for CRM: Patient called to inform [name] that he has used up all the nystatin cream (MYCOSTATIN) but is still having the issues that he was seen for on 02/20/2019. Patient would like a call to discuss what he need to do. Ph# [phone]

## 2019-03-21 ENCOUNTER — Other Ambulatory Visit: Payer: Self-pay | Admitting: Adult Health

## 2019-03-21 MED ORDER — NYSTATIN 100000 UNIT/GM EX POWD: Freq: Three times a day (TID) | CUTANEOUS | 0 refills | Status: DC

## 2019-03-21 NOTE — Telephone Encounter (Signed)
Spoke to [place] and advised of below message.  He is now scheduled for future visit and will pick up rx from the pharmacy.

## 2019-03-21 NOTE — Telephone Encounter (Signed)
Please have him follow up in the office next week. In the meantime, I will send in a powder

## 2019-03-27 ENCOUNTER — Ambulatory Visit: Payer: Medicare Other | Admitting: Adult Health

## 2019-03-29 ENCOUNTER — Other Ambulatory Visit: Payer: Self-pay | Admitting: Adult Health

## 2019-03-29 DIAGNOSIS — N401 Enlarged prostate with lower urinary tract symptoms: Secondary | ICD-10-CM

## 2019-03-29 DIAGNOSIS — N138 Other obstructive and reflux uropathy: Secondary | ICD-10-CM

## 2019-04-01 NOTE — Telephone Encounter (Signed)
Sent to the pharmacy by e-scribe. 

## 2019-04-16 ENCOUNTER — Other Ambulatory Visit: Payer: Self-pay | Admitting: Adult Health

## 2019-04-16 DIAGNOSIS — B356 Tinea cruris: Secondary | ICD-10-CM

## 2019-04-21 DIAGNOSIS — Z23 Encounter for immunization: Secondary | ICD-10-CM | POA: Diagnosis not present

## 2019-05-21 ENCOUNTER — Telehealth: Payer: Self-pay

## 2019-05-21 DIAGNOSIS — L600 Ingrowing nail: Secondary | ICD-10-CM

## 2019-05-21 NOTE — Telephone Encounter (Signed)
Referral sent 

## 2019-05-21 NOTE — Telephone Encounter (Signed)
Copied from [place] [phone]. Topic: Referral - Request for Referral >> May 21, 2019  9:18 AM Carolyn Stare wrote: Pt req a referral to see a podiatrist for ingrown toe nail on right big toe

## 2019-05-29 ENCOUNTER — Other Ambulatory Visit: Payer: Self-pay | Admitting: Adult Health

## 2019-05-29 NOTE — Telephone Encounter (Signed)
Sent to the pharmacy by e-scribe. 

## 2019-06-04 ENCOUNTER — Ambulatory Visit: Payer: Medicare Other | Admitting: Nurse Practitioner

## 2019-06-11 ENCOUNTER — Other Ambulatory Visit: Payer: Self-pay | Admitting: Podiatry

## 2019-06-11 ENCOUNTER — Ambulatory Visit (INDEPENDENT_AMBULATORY_CARE_PROVIDER_SITE_OTHER): Payer: Medicare Other

## 2019-06-11 ENCOUNTER — Encounter: Payer: Self-pay | Admitting: Podiatry

## 2019-06-11 ENCOUNTER — Ambulatory Visit (INDEPENDENT_AMBULATORY_CARE_PROVIDER_SITE_OTHER): Payer: Medicare Other | Admitting: Podiatry

## 2019-06-11 ENCOUNTER — Other Ambulatory Visit: Payer: Self-pay

## 2019-06-11 DIAGNOSIS — L600 Ingrowing nail: Secondary | ICD-10-CM | POA: Diagnosis not present

## 2019-06-11 DIAGNOSIS — M6740 Ganglion, unspecified site: Secondary | ICD-10-CM

## 2019-06-11 DIAGNOSIS — M7751 Other enthesopathy of right foot: Secondary | ICD-10-CM | POA: Diagnosis not present

## 2019-06-11 DIAGNOSIS — M19071 Primary osteoarthritis, right ankle and foot: Secondary | ICD-10-CM | POA: Diagnosis not present

## 2019-06-11 DIAGNOSIS — M779 Enthesopathy, unspecified: Secondary | ICD-10-CM

## 2019-06-11 MED ORDER — GENTAMICIN SULFATE 0.1 % EX CREA: 1.0000 "application " | TOPICAL_CREAM | Freq: Two times a day (BID) | CUTANEOUS | 1 refills | Status: DC

## 2019-06-11 NOTE — Patient Instructions (Signed)

## 2019-06-12 ENCOUNTER — Ambulatory Visit (INDEPENDENT_AMBULATORY_CARE_PROVIDER_SITE_OTHER): Payer: Medicare Other | Admitting: Nurse Practitioner

## 2019-06-12 ENCOUNTER — Encounter: Payer: Self-pay | Admitting: Nurse Practitioner

## 2019-06-12 VITALS — BP 146/81 | HR 76 | Temp 97.0°F | Ht 70.0 in | Wt 215.6 lb

## 2019-06-12 DIAGNOSIS — R197 Diarrhea, unspecified: Secondary | ICD-10-CM | POA: Diagnosis not present

## 2019-06-12 DIAGNOSIS — R1319 Other dysphagia: Secondary | ICD-10-CM

## 2019-06-12 DIAGNOSIS — K219 Gastro-esophageal reflux disease without esophagitis: Secondary | ICD-10-CM

## 2019-06-12 DIAGNOSIS — R1310 Dysphagia, unspecified: Secondary | ICD-10-CM

## 2019-06-12 NOTE — Patient Instructions (Signed)
Your health issues we discussed today were:   GERD (reflux/heartburn) and dysphagia (swallowing difficulties): 1. Continue to take your acid blocker 2. Call us if you have any worsening or severe symptoms  Diarrhea after gallbladder removal: 1. As we discussed, you can use Imodium to see if this helps reduce the amount of diarrhea you have 2. If this is not very effective for you call and let us know and we can try other options such as cholestyramine (a medication) 3. Call us if you have any worsening or severe symptoms  Overall I recommend:  1. Continue taking your other medications 2. Return for follow-up as needed for GI symptoms 3. Call us if you have any questions or concerns.   At [facility] we value your feedback. You may receive a survey about your visit today. Please share your experience as we strive to create trusting relationships with our patients to provide genuine, compassionate, quality care.  We appreciate your understanding and patience as we review any laboratory studies, imaging, and other diagnostic tests that are ordered as we care for you. Our office policy is 5 business days for review of these results, and any emergent or urgent results are addressed in a timely manner for your best interest. If you do not hear from our office in 1 week, please contact us.   We also encourage the use of MyChart, which contains your medical information for your review as well. If you are not enrolled in this feature, an access code is on this after visit summary for your convenience. Thank you for allowing [place] to be involved in your care.  It was great to see you today!  I hope you have a Happy Thanksgiving!!     Because of recent events of COVID-19 ("Coronavirus"), follow CDC recommendations:  1. Wash your hand frequently 2. Avoid touching your face 3. Stay away from people who are sick 4. If you have symptoms such as fever, cough, shortness of breath then  call your healthcare provider for further guidance 5. If you are sick, STAY AT HOME unless otherwise directed by your healthcare provider. 6. Follow directions from state and national officials regarding staying safe

## 2019-06-12 NOTE — Assessment & Plan Note (Signed)
Essentially resolved at this time.  Continue to monitor and follow-up as needed.

## 2019-06-12 NOTE — Assessment & Plan Note (Signed)
The patient describes persistent diarrhea about once a week since his gallbladder was removed.  Imodium typically works well for him.  He was wanting to check with [place] before taking Imodium regularly.  I recommended he continue to take Imodium as it seems to work well.  Likely bile salt diarrhea.  If he does not have any success with regular Imodium we can try cholestyramine.  Follow-up as needed.

## 2019-06-12 NOTE — Progress Notes (Signed)
Referring Provider: Dorothyann Peng, NP Primary Care Physician:  Dorothyann Peng, NP Primary GI:  Dr. Gala Romney  Chief Complaint  Patient presents with  . Dysphagia    doing well  . Gastroesophageal Reflux    doing fine    HPI:   Daniel Guerrero is a 73 y.o. male who presents for follow-up on GERD and dysphagia.  The patient was last seen in our office 11/26/2018 for the same as well as history of colon polyps.  His last visit was a virtual office visit due to COVID-19/coronavirus pandemic.  His last visit was a post procedure follow-up.  Colonoscopy updated 08/26/2018 which found tubular adenoma polyps and diverticulosis.  EGD same day found mild Schatzki's ring status post dilation, small hiatal hernia.  Recommend continue current medications and repeat colonoscopy in 5 years if overall health permits ([id number]).  At his last visit he noted his dysphagia resolved, GERD doing well on PPI (Prevacid).  Some brief post cholecystectomy hematochezia but improved and resolved.  Stools are okay if he eats right otherwise will have loose stools.  Has persisting gas.  No other overt GI symptoms.  Recommended continue medications, follow-up in 6 months.  Today he states he's doing ok overall. GERD well managed on PPI. No further dysphagia. Has a cholecystectomy about a year ago. Still having diarrhea about once a week, which he has been using Imodium which helps. Also some rare rectal bleeding with excessive loose stools (has only occurred 1-2 times). Denies abdominal pain, N/V, melena, fever, chills, unintentional weight loss. Denies URI or flu-like symptoms. Denies loss of sense of taste or smell. Denies chest pain, dyspnea, dizziness, lightheadedness, syncope, near syncope. Denies any other upper or lower GI symptoms.  Past Medical History:  Diagnosis Date  . Allergy   . BPH (benign prostatic hyperplasia)   . Cataract of both eyes    implants both eyes   . Eczema   . Enlarged aorta ([place])   . Erectile  dysfunction   . Hearing loss    high frequency  . History of inguinal hernia repair, bilateral   . Hypertension   . Kidney stones   . Loss of hearing    high frequency  . Meniere disease   . Paroxysmal SVT (supraventricular tachycardia) ([place])   . Psoriasis   . S/P appendectomy   . S/P tonsillectomy   . Seasonal allergies   . SVT (supraventricular tachycardia) ([facility])     Past Surgical History:  Procedure Laterality Date  . Ablasion    . APPENDECTOMY    . CARDIAC ELECTROPHYSIOLOGY [place] AND ABLATION  2015  . CHOLECYSTECTOMY    . COLONOSCOPY     unsure where or when- states greater than 10 yrs ago per pt- was normal per pt and his wife   . COLONOSCOPY WITH PROPOFOL N/A 08/26/2018   Procedure: COLONOSCOPY WITH PROPOFOL;  Surgeon: [name], MD;  [facility];  Service: Endoscopy;  Laterality: N/A;  10:00am  . CYSTOSCOPY WITH RETROGRADE PYELOGRAM, URETEROSCOPY AND STENT PLACEMENT Left 12/30/2014   Procedure: CYSTOSCOPY WITH LEFT URETEROSCOPY STONE EXTRACTION WITH STENT;  Surgeon: Franchot Gallo, MD;  Location: [facility];  Service: Urology;  Laterality: Left;  . ELECTROPHYSIOLOGIC STUDY N/A 08/24/2015   Procedure: SVT Ablation;  Surgeon: Thompson Grayer, MD;  Location: [facility];  Service: Cardiovascular;  Laterality: N/A;  . ENDOVENOUS ABLATION SAPHENOUS VEIN W/ LASER Right 12/04/2016   endovenous laser ablation right greater saphenous vein by Jeneen Rinks  Kellie Simmering MD   . ESOPHAGOGASTRODUODENOSCOPY N/A 08/18/2016   Procedure: ESOPHAGOGASTRODUODENOSCOPY (EGD);  Surgeon: [name], MD;  [facility];  Service: Endoscopy;  Laterality: N/A;  . ESOPHAGOGASTRODUODENOSCOPY N/A 09/06/2016   Procedure: ESOPHAGOGASTRODUODENOSCOPY (EGD);  Surgeon: [name], MD;  [facility];  Service: Endoscopy;  Laterality: N/A;  100 - moved to 2/14 @ 2:15 per Tretha Sciara  . ESOPHAGOGASTRODUODENOSCOPY (EGD) WITH PROPOFOL N/A 08/26/2018   Procedure: ESOPHAGOGASTRODUODENOSCOPY  (EGD) WITH PROPOFOL;  Surgeon: [name], MD;  [facility];  Service: Endoscopy;  Laterality: N/A;  . EYE SURGERY     Catracts removed both eye 30 yrs ago  . HERNIA REPAIR     right and left  . HOLMIUM LASER APPLICATION Left [id number]   Procedure: HOLMIUM LASER APPLICATION;  Surgeon: Franchot Gallo, MD;  [facility];  Service: Urology;  Laterality: Left;  Marland Kitchen MALONEY DILATION N/A 09/06/2016   Procedure: Venia Minks DILATION;  Surgeon: [name], MD;  [facility];  Service: Endoscopy;  Laterality: N/A;  Venia Minks DILATION N/A 08/26/2018   Procedure: Venia Minks DILATION;  Surgeon: [name], MD;  [facility];  Service: Endoscopy;  Laterality: N/A;  . POLYPECTOMY  08/26/2018   Procedure: POLYPECTOMY;  Surgeon: [name], MD;  [facility];  Service: Endoscopy;;  polyp at descending colon x2  . TONSILLECTOMY      Current Outpatient Medications  Medication Sig Dispense Refill  . amLODipine (NORVASC) 5 MG tablet TAKE 1 TABLET BY MOUTH EVERY DAY (due FOR physical) 90 tablet 3  . aspirin 325 MG tablet Take 325 mg by mouth every 4 (four) hours as needed for mild pain.    . cetirizine (ZYRTEC) 10 MG tablet Take 10 mg by mouth daily as needed for allergies.     Marland Kitchen CRANBERRY POWDER PO Take by mouth. 15,000 UNITS DAILY    . Cyanocobalamin (VITAMIN B-12 PO) Take 1 tablet by mouth daily.     Marland Kitchen desonide (DESOWEN) 0.05 % cream Apply 1 application topically daily as needed (rash).     . diphenhydrAMINE HCl (BENADRYL ALLERGY PO) Take 1 tablet by mouth daily.    Marland Kitchen dutasteride (AVODART) 0.5 MG capsule TAKE ONE CAPSULE BY MOUTH DAILY 90 capsule 3  . fluticasone (FLONASE) 50 MCG/ACT nasal spray INSTILL TWO SPRAYS IN EACH NOSTRIL DAILY AS NEEDED FOR ALLERGIES 48 g 1  . gentamicin cream (GARAMYCIN) 0.1 % Apply 1 application topically 2 (two) times daily. 15 g 1  . hydrocortisone cream 1 % Apply 1 application topically daily as needed for itching.    .  lansoprazole (PREVACID) 30 MG capsule Take 1 capsule (30 mg total) by mouth daily. 90 capsule 3  . nystatin (MYCOSTATIN/NYSTOP) powder Apply topically 3 (three) times daily. 45 g 0  . nystatin cream (MYCOSTATIN) APPLY TO THE AFFECTED AREA(S) TWICE DAILY 30 g 1  . Simethicone (GAS-X PO) Take 1 tablet by mouth daily as needed.     . vitamin C (ASCORBIC ACID) 500 MG tablet Take 500 mg by mouth daily.     No current facility-administered medications for this visit.     Allergies as of 06/12/2019 - Review Complete 06/12/2019  Allergen Reaction Noted  . Peanut-containing drug products Hives 12/31/2009  . Penicillins Hives 03/11/2008  . Quinolones  04/11/2018    Family History  Problem Relation Age of Onset  . Cancer Mother        lung  .  Thyroid disease Mother   . Diabetes Father   . GI Bleed Father   . Aneurysm Father   . AAA (abdominal aortic aneurysm) Father   . Colon cancer Neg Hx   . Colon polyps Neg Hx   . Rectal cancer Neg Hx   . Stomach cancer Neg Hx     Social History   Socioeconomic History  . Marital status: Married    Spouse name: Not on file  . Number of children: 3  . Years of education: Not on file  . Highest education level: Not on file  Occupational History  . Occupation: professor  Social Needs  . Financial resource strain: Not hard at all  . Food insecurity    Worry: Never true    Inability: Never true  . Transportation needs    Medical: No    Non-medical: No  Tobacco Use  . Smoking status: Former Smoker    Quit date: 07/24/1964    Years since quitting: 54.9  . Smokeless tobacco: Never Used  . Tobacco comment: Quit 20-30 years ago; smoked pipe and cigars   Substance and Sexual Activity  . Alcohol use: Yes    Alcohol/week: 4.0 standard drinks    Types: 1 Glasses of wine, 1 Cans of beer, 2 Shots of liquor per week    Comment: 4 drinks a week  . Drug use: No  . Sexual activity: Not on file  Lifestyle  . Physical activity    Days per week: 0 days     Minutes per session: 0 min  . Stress: Not at all  Relationships  . Social Herbalist on phone: Twice a week    Gets together: Once a week    Attends religious service: Never    Active member of club or organization: Yes    Attends meetings of clubs or organizations: More than 4 times per year    Relationship status: Married  Other Topics Concern  . Not on file  Social History Narrative   Daily Caffeine use: 5-6 drinks daily      08/07/2018: No drug use, some daily alcohol use at night but patient does not feel like it disrupts his health or is a safety concern.       Lives with wife in two story home; three sons, five grandchildren in [place] area.   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              Review of Systems: General: Negative for anorexia, weight loss, fever, chills, fatigue, weakness. ENT: Negative for hoarseness, difficulty swallowing. CV: Negative for chest pain, angina, palpitations, peripheral edema.  Respiratory: Negative for dyspnea at rest, cough, sputum, wheezing.  GI: See history of present illness. Endo: Negative for unusual weight change.  Heme: Negative for bruising or bleeding. Allergy: Negative for rash or hives.   Physical Exam: BP (!) 146/81   Pulse 76   Temp (!) 97 F (36.1 C) (Oral)   Ht 5\' 10"  (1.778 m)   Wt 215 lb 9.6 oz (97.8 kg)   BMI 30.94 kg/m  General:   Alert and oriented. Pleasant and cooperative. Well-nourished and well-developed.  Eyes:  Without icterus, sclera clear and conjunctiva pink.  Ears:  Normal auditory acuity. Cardiovascular:  S1, S2 present without murmurs appreciated. Extremities without clubbing or edema. Respiratory:  Clear to auscultation bilaterally. No wheezes, rales, or rhonchi. No distress.  Gastrointestinal:  +  BS, soft, non-tender and non-distended. No HSM noted. No guarding or rebound. No masses appreciated.  Rectal:   Deferred  Musculoskalatal:  Symmetrical without gross deformities. Neurologic:  Alert and oriented x4;  grossly normal neurologically. Psych:  Alert and cooperative. Normal mood and affect. Heme/Lymph/Immune: No excessive bruising noted.    06/12/2019 8:52 AM   Disclaimer: This note was dictated with voice recognition software. Similar sounding words can inadvertently be transcribed and may not be corrected upon review.

## 2019-06-12 NOTE — Assessment & Plan Note (Signed)
GERD continues to do well on twice daily PPI.  Occasionally misses a dose in the evening but does not result in overt flare symptoms.  Recommend he continue taking his PPI.  Follow-up as needed.

## 2019-06-21 NOTE — Progress Notes (Signed)
   Subjective: Patient presents today for evaluation of intermittent pain to the medial border of the left hallux that began two months ago. He reports associated redness. Patient is concerned for possible ingrown nail. Wearing shoes increases the pain. He has not had any treatment for the symptoms.  He also reports a nodule to the right foot that appeared a few months ago. Walking increases the pain. He has not had any treatment. Patient presents today for further treatment and evaluation.  Past Medical History:  Diagnosis Date  . Allergy   . BPH (benign prostatic hyperplasia)   . Cataract of both eyes    implants both eyes   . Eczema   . Enlarged aorta ([place])   . Erectile dysfunction   . Hearing loss    high frequency  . History of inguinal hernia repair, bilateral   . Hypertension   . Kidney stones   . Loss of hearing    high frequency  . Meniere disease   . Paroxysmal SVT (supraventricular tachycardia) ([place])   . Psoriasis   . S/P appendectomy   . S/P tonsillectomy   . Seasonal allergies   . SVT (supraventricular tachycardia) ([facility])     Objective:  General: Well developed, nourished, in no acute distress, alert and oriented x3   Dermatology: Skin is warm, dry and supple bilateral. Medial border of the left great toe appears to be erythematous with evidence of an ingrowing nail. Pain on palpation noted to the border of the nail fold. The remaining nails appear unremarkable at this time. There are no open sores, lesions.  Vascular: Dorsalis Pedis artery and Posterior Tibial artery pedal pulses palpable. No lower extremity edema noted.   Neruologic: Grossly intact via light touch bilateral.  Musculoskeletal: Muscular strength within normal limits in all groups bilateral. Normal range of motion noted to all pedal and ankle joints.   Radiographic Exam: DJD and bone spur noted to the 1st met-cuneiform of the right foot.   Assesement: #1 Paronychia with ingrowing nail medial  border left great toe #2 Pain in toe #3 Incurvated nail #4 DJD / bone spur right 1st met-cuneiform   Plan of Care:  1. Patient evaluated. X-Rays reviewed.  2. Discussed treatment alternatives and plan of care. Explained nail avulsion procedure and post procedure course to patient. 3. Patient opted for permanent partial nail avulsion of the medial border left great toe.  4. Prior to procedure, local anesthesia infiltration utilized using 3 ml of a 50:50 mixture of 2% plain lidocaine and 0.5% plain marcaine in a normal hallux block fashion and a betadine prep performed.  5. Partial permanent nail avulsion with chemical matrixectomy performed using [id number] applications of phenol followed by alcohol flush.  6. Light dressing applied. 7. Prescription for Gentamicin cream provided to patient to use daily with a bandage.  8. Recommended good shoe gear.  9. Return to clinic in 2 weeks.  Edrick Kins, DPM [facility]  Dr. Edrick Kins, [address]                                        [place], [place] [address]                Office [phone]  Fax [phone]

## 2019-06-25 ENCOUNTER — Ambulatory Visit (INDEPENDENT_AMBULATORY_CARE_PROVIDER_SITE_OTHER): Payer: Medicare Other | Admitting: Podiatry

## 2019-06-25 ENCOUNTER — Other Ambulatory Visit: Payer: Self-pay

## 2019-06-25 ENCOUNTER — Encounter: Payer: Self-pay | Admitting: Podiatry

## 2019-06-25 DIAGNOSIS — L600 Ingrowing nail: Secondary | ICD-10-CM

## 2019-06-25 DIAGNOSIS — M19071 Primary osteoarthritis, right ankle and foot: Secondary | ICD-10-CM

## 2019-06-25 DIAGNOSIS — M7751 Other enthesopathy of right foot: Secondary | ICD-10-CM

## 2019-06-27 ENCOUNTER — Telehealth: Payer: Self-pay | Admitting: Internal Medicine

## 2019-06-27 DIAGNOSIS — K219 Gastro-esophageal reflux disease without esophagitis: Secondary | ICD-10-CM

## 2019-06-27 MED ORDER — LANSOPRAZOLE 30 MG PO CPDR: 30.0000 mg | DELAYED_RELEASE_CAPSULE | Freq: Two times a day (BID) | ORAL | 3 refills | Status: DC

## 2019-06-27 NOTE — Telephone Encounter (Signed)
Refilled Rx and changed to bid per patient request. Called patient to inform him, left message on his cell voicemail.

## 2019-06-27 NOTE — Telephone Encounter (Signed)
Spoke with pt.pt was seen 06/12/2019. Pt is taking Prevacid generic and he is taking it once daily. Pt was previously taking it twice daily and went to once daily when he was doing better. Pt is beginning to have some swallowing issues and would like the rx changed to Prevacid bid instead of once daily. Please advise.

## 2019-06-27 NOTE — Addendum Note (Signed)
Addended by: Gordy Levan, ERIC A on: 06/27/2019 12:40 PM   Modules accepted: Orders

## 2019-06-27 NOTE — Telephone Encounter (Signed)
[phone]  PLEASE CALL PATIENT, HE HAS A QUESTION ABOUT HIS PRESCRIPTION

## 2019-07-01 NOTE — Progress Notes (Signed)
   Subjective: 73 y.o. male presents today status post permanent nail avulsion procedure of the medial border of the left great toe that was performed on 06/11/2019. He states he is doing well. He denies any pain or modifying factors. He has been applying Gentamicin cream as directed. Patient is here for further evaluation and treatment.   Past Medical History:  Diagnosis Date  . Allergy   . BPH (benign prostatic hyperplasia)   . Cataract of both eyes    implants both eyes   . Eczema   . Enlarged aorta ([place])   . Erectile dysfunction   . Hearing loss    high frequency  . History of inguinal hernia repair, bilateral   . Hypertension   . Kidney stones   . Loss of hearing    high frequency  . Meniere disease   . Paroxysmal SVT (supraventricular tachycardia) ([place])   . Psoriasis   . S/P appendectomy   . S/P tonsillectomy   . Seasonal allergies   . SVT (supraventricular tachycardia) ([facility])     Objective: Skin is warm, dry and supple. Nail and respective nail fold appears to be healing appropriately. Open wound to the associated nail fold with a granular wound base and moderate amount of fibrotic tissue. Minimal drainage noted. Mild erythema around the periungual region likely due to phenol chemical matricectomy.  Assessment: #1 postop permanent partial nail avulsion medial border left great toe #2 open wound periungual nail fold of respective digit.   Plan of care: #1 patient was evaluated  #2 debridement of open wound was performed to the periungual border of the respective toe using a currette. Antibiotic ointment and Band-Aid was applied. #3 patient is to return to clinic on a PRN basis.   Edrick Kins, DPM [facility]  Dr. Edrick Kins, [address]                                        [place],  [address]                Office [phone]  Fax [phone]

## 2019-08-15 ENCOUNTER — Other Ambulatory Visit: Payer: Self-pay | Admitting: Adult Health

## 2019-08-15 ENCOUNTER — Ambulatory Visit (INDEPENDENT_AMBULATORY_CARE_PROVIDER_SITE_OTHER): Payer: Medicare Other

## 2019-08-15 VITALS — Ht 70.0 in | Wt 210.0 lb

## 2019-08-15 DIAGNOSIS — Z0000 Encounter for general adult medical examination without abnormal findings: Secondary | ICD-10-CM | POA: Diagnosis not present

## 2019-08-15 DIAGNOSIS — Z91010 Allergy to peanuts: Secondary | ICD-10-CM

## 2019-08-15 MED ORDER — EPINEPHRINE 0.3 MG/0.3ML IJ SOAJ: 0.3000 mg | INTRAMUSCULAR | 1 refills | Status: AC | PRN

## 2019-08-15 NOTE — Patient Instructions (Signed)
Daniel Guerrero , Thank you for taking time to participate in your Medicare Wellness Visit. I appreciate your ongoing commitment to your health goals. Please review the following plan we discussed and let me know if I can assist you in the future.   Screening recommendations/referrals: Colorectal Screening: colonoscopy on 08/26/2018; due again 08/27/2023.  Vision and Dental Exams: Recommended annual ophthalmology exams for early detection of glaucoma and other disorders of the eye Recommended annual dental exams for proper oral hygiene  Diabetic Exams: Diabetic Eye Exam: N/A Diabetic Foot Exam: N/A  Vaccinations: Influenza vaccine: patient states this was done in Sept 2020. Pneumococcal vaccine: completed 05/01/2018; currently up to date. Tdap vaccine: completed 09/28/2017; due again 09/29/2027. Shingles vaccine: completed 08/10/2017 & 12/06/2017.   Advanced directives: Advance directives discussed with you today.  Please bring a copy of your POA (Power of [place]) and/or Living Will to your next appointment.  Goals: Recommend to drink at least 6-8 8oz glasses of water per day.  Increase exercise for at least 150 minutes per week.  Recommend to remove any items from the home that may cause slips or trips.  Next appointment: Please schedule your Annual Wellness Visit with your Nurse Health Advisor in one year.  Preventive Care 33 Years and Older, Male Preventive care refers to lifestyle choices and visits with your health care provider that can promote health and wellness. What does preventive care include?  A yearly physical exam. This is also called an annual well check.  Dental exams once or twice a year.  Routine eye exams. Ask your health care provider how often you should have your eyes checked.  Personal lifestyle choices, including:  Daily care of your teeth and gums.  Regular physical activity.  Eating a healthy diet.  Avoiding tobacco and drug use.  Limiting  alcohol use.  Practicing safe sex.  Taking low doses of aspirin every day if recommended by your health care provider..  Taking vitamin and mineral supplements as recommended by your health care provider. What happens during an annual well check? The services and screenings done by your health care provider during your annual well check will depend on your age, overall health, lifestyle risk factors, and family history of disease. Counseling  Your health care provider may ask you questions about your:  Alcohol use.  Tobacco use.  Drug use.  Emotional well-being.  Home and relationship well-being.  Sexual activity.  Eating habits.  History of falls.  Memory and ability to understand (cognition).  Work and work Statistician. Screening  You may have the following tests or measurements:  Height, weight, and BMI.  Blood pressure.  Lipid and cholesterol levels. These may be checked every 5 years, or more frequently if you are over 38 years old.  Skin check.  Lung cancer screening. You may have this screening every year starting at age 75 if you have a 30-pack-year history of smoking and currently smoke or have quit within the past 15 years.  Fecal occult blood test (FOBT) of the stool. You may have this test every year starting at age 51.  Flexible sigmoidoscopy or colonoscopy. You may have a sigmoidoscopy every 5 years or a colonoscopy every 10 years starting at age 28.  Prostate cancer screening. Recommendations will vary depending on your family history and other risks.  Hepatitis C blood test.  Hepatitis B blood test.  Sexually transmitted disease (STD) testing.  Diabetes screening. This is done by checking your blood sugar (glucose) after you have  not eaten for a while (fasting). You may have this done every 1-3 years.  Abdominal aortic aneurysm (AAA) screening. You may need this if you are a current or former smoker.  Osteoporosis. You may be screened  starting at age 31 if you are at high risk. Talk with your health care provider about your test results, treatment options, and if necessary, the need for more tests. Vaccines  Your health care provider may recommend certain vaccines, such as:  Influenza vaccine. This is recommended every year.  Tetanus, diphtheria, and acellular pertussis (Tdap, Td) vaccine. You may need a Td booster every 10 years.  Zoster vaccine. You may need this after age 57.  Pneumococcal 13-valent conjugate (PCV13) vaccine. One dose is recommended after age 42.  Pneumococcal polysaccharide (PPSV23) vaccine. One dose is recommended after age 44. Talk to your health care provider about which screenings and vaccines you need and how often you need them. This information is not intended to replace advice given to you by your health care provider. Make sure you discuss any questions you have with your health care provider. Document Released: 08/06/2015 Document Revised: 03/29/2016 Document Reviewed: 05/11/2015 Elsevier Interactive Patient Education  2017 [place] Prevention in the Home Falls can cause injuries. They can happen to people of all ages. There are many things you can do to make your home safe and to help prevent falls. What can I do on the outside of my home?  Regularly fix the edges of walkways and driveways and fix any cracks.  Remove anything that might make you trip as you walk through a door, such as a raised step or threshold.  Trim any bushes or trees on the path to your home.  Use bright outdoor lighting.  Clear any walking paths of anything that might make someone trip, such as rocks or tools.  Regularly check to see if handrails are loose or broken. Make sure that both sides of any steps have handrails.  Any raised decks and porches should have guardrails on the edges.  Have any leaves, snow, or ice cleared regularly.  Use sand or salt on walking paths during  winter.  Clean up any spills in your garage right away. This includes oil or grease spills. What can I do in the bathroom?  Use night lights.  Install grab bars by the toilet and in the tub and shower. Do not use towel bars as grab bars.  Use non-skid mats or decals in the tub or shower.  If you need to sit down in the shower, use a plastic, non-slip stool.  Keep the floor dry. Clean up any water that spills on the floor as soon as it happens.  Remove soap buildup in the tub or shower regularly.  Attach bath mats securely with double-sided non-slip rug tape.  Do not have throw rugs and other things on the floor that can make you trip. What can I do in the bedroom?  Use night lights.  Make sure that you have a light by your bed that is easy to reach.  Do not use any sheets or blankets that are too big for your bed. They should not hang down onto the floor.  Have a firm chair that has side arms. You can use this for support while you get dressed.  Do not have throw rugs and other things on the floor that can make you trip. What can I do in the kitchen?  Clean up any  spills right away.  Avoid walking on wet floors.  Keep items that you use a lot in easy-to-reach places.  If you need to reach something above you, use a strong step stool that has a grab bar.  Keep electrical cords out of the way.  Do not use floor polish or wax that makes floors slippery. If you must use wax, use non-skid floor wax.  Do not have throw rugs and other things on the floor that can make you trip. What can I do with my stairs?  Do not leave any items on the stairs.  Make sure that there are handrails on both sides of the stairs and use them. Fix handrails that are broken or loose. Make sure that handrails are as long as the stairways.  Check any carpeting to make sure that it is firmly attached to the stairs. Fix any carpet that is loose or worn.  Avoid having throw rugs at the top or  bottom of the stairs. If you do have throw rugs, attach them to the floor with carpet tape.  Make sure that you have a light switch at the top of the stairs and the bottom of the stairs. If you do not have them, ask someone to add them for you. What else can I do to help prevent falls?  Wear shoes that:  Do not have high heels.  Have rubber bottoms.  Are comfortable and fit you well.  Are closed at the toe. Do not wear sandals.  If you use a stepladder:  Make sure that it is fully opened. Do not climb a closed stepladder.  Make sure that both sides of the stepladder are locked into place.  Ask someone to hold it for you, if possible.  Clearly mark and make sure that you can see:  Any grab bars or handrails.  First and last steps.  Where the edge of each step is.  Use tools that help you move around (mobility aids) if they are needed. These include:  Canes.  Walkers.  Scooters.  Crutches.  Turn on the lights when you go into a dark area. Replace any light bulbs as soon as they burn out.  Set up your furniture so you have a clear path. Avoid moving your furniture around.  If any of your floors are uneven, fix them.  If there are any pets around you, be aware of where they are.  Review your medicines with your doctor. Some medicines can make you feel dizzy. This can increase your chance of falling. Ask your doctor what other things that you can do to help prevent falls. This information is not intended to replace advice given to you by your health care provider. Make sure you discuss any questions you have with your health care provider. Document Released: 05/06/2009 Document Revised: 12/16/2015 Document Reviewed: 08/14/2014 Elsevier Interactive Patient Education  2017 [employer].

## 2019-08-15 NOTE — Progress Notes (Signed)
This visit is being conducted via phone call due to the COVID-19 pandemic. This patient has given me verbal consent via phone to conduct this visit, patient states they are participating from their home address. Some vital signs may be absent or patient reported.   Patient identification: identified by name, DOB, and current address.  Location provider: [facility], Office Persons participating in the virtual visit: Daniel Guerrero and Daniel Forts, LPN.   Subjective:   Daniel Guerrero is a 74 y.o. male who presents for Medicare Annual/Subsequent preventive examination.  Daniel Guerrero is doing well at this time. 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 He walks several times a week.   Review of Systems:   Cardiac Risk Factors include: male gender;advanced age (>65men, >47 women);hypertension;sedentary lifestyle;obesity (BMI >30kg/m2)     Objective:    Vitals: Ht 5\' 10"  (1.778 m)   Wt 210 lb (95.3 kg)   BMI 30.13 kg/m   Body mass index is 30.13 kg/m.  Advanced Directives 08/15/2019 12/26/2018 08/19/2018 08/07/2018 04/27/2018 07/27/2017 12/04/2016  Does Patient Have a Medical Advance Directive? Yes No;Yes Yes Yes Yes Yes No  Type of Paramedic of [place];Living will Healthcare Power of [place];Living will [place];Living will [place];Living will - -  Does patient want to make changes to medical advance directive? No - Patient declined - No - Patient declined No - Patient declined No - Patient declined - -  Copy of [place] in Chart? No - copy requested - - No - copy requested No - copy requested - -  Would patient like information on creating a medical advance directive? - - - - No - Patient declined - -    Tobacco Social History   Tobacco Use  Smoking Status Former Smoker  . Quit date: 07/24/1964  . Years since quitting: 55.0  Smokeless  Tobacco Never Used  Tobacco Comment   Quit 20-30 years ago; smoked pipe and cigars      Counseling given: Not Answered Comment: Quit 20-30 years ago; smoked pipe and cigars    Clinical Intake:  Pre-visit preparation completed: Yes  Pain : No/denies pain     BMI - recorded: 30.13 Nutritional Status: BMI > 30  Obese Nutritional Risks: None Diabetes: No  How often do you need to have someone help you when you read instructions, pamphlets, or other written materials from your doctor or pharmacy?: 1 - Never What is the last grade level you completed in school?: 4 years college  Interpreter Needed?: No  Information entered by :: Daniel Forts, LPN.  Past Medical History:  Diagnosis Date  . Allergy   . BPH (benign prostatic hyperplasia)   . Cataract of both eyes    implants both eyes   . Eczema   . Enlarged aorta ([place])   . Erectile dysfunction   . Hearing loss    high frequency  . History of inguinal hernia repair, bilateral   . Hypertension   . Kidney stones   . Loss of hearing    high frequency  . Meniere disease   . Paroxysmal SVT (supraventricular tachycardia) ([place])   . Psoriasis   . S/P appendectomy   . S/P tonsillectomy   . Seasonal allergies   . SVT (supraventricular tachycardia) ([facility])    Past Surgical History:  Procedure Laterality Date  . Ablasion    . APPENDECTOMY    .  CARDIAC ELECTROPHYSIOLOGY [place] AND ABLATION  2015  . CHOLECYSTECTOMY    . COLONOSCOPY     unsure where or when- states greater than 10 yrs ago per pt- was normal per pt and his wife   . COLONOSCOPY WITH PROPOFOL N/A 08/26/2018   Procedure: COLONOSCOPY WITH PROPOFOL;  Surgeon: [name], MD;  [facility];  Service: Endoscopy;  Laterality: N/A;  10:00am  . CYSTOSCOPY WITH RETROGRADE PYELOGRAM, URETEROSCOPY AND STENT PLACEMENT Left 12/30/2014   Procedure: CYSTOSCOPY WITH LEFT URETEROSCOPY STONE EXTRACTION WITH STENT;  Surgeon: Franchot Gallo, MD;  Location: [facility];   Service: Urology;  Laterality: Left;  . ELECTROPHYSIOLOGIC STUDY N/A 08/24/2015   Procedure: SVT Ablation;  Surgeon: Thompson Grayer, MD;  Location: [facility];  Service: Cardiovascular;  Laterality: N/A;  . ENDOVENOUS ABLATION SAPHENOUS VEIN W/ LASER Right 12/04/2016   endovenous laser ablation right greater saphenous vein by Tinnie Gens MD   . ESOPHAGOGASTRODUODENOSCOPY N/A 08/18/2016   Procedure: ESOPHAGOGASTRODUODENOSCOPY (EGD);  Surgeon: [name], MD;  [facility];  Service: Endoscopy;  Laterality: N/A;  . ESOPHAGOGASTRODUODENOSCOPY N/A 09/06/2016   Procedure: ESOPHAGOGASTRODUODENOSCOPY (EGD);  Surgeon: [name], MD;  [facility];  Service: Endoscopy;  Laterality: N/A;  100 - moved to 2/14 @ 2:15 per Tretha Sciara  . ESOPHAGOGASTRODUODENOSCOPY (EGD) WITH PROPOFOL N/A 08/26/2018   Procedure: ESOPHAGOGASTRODUODENOSCOPY (EGD) WITH PROPOFOL;  Surgeon: [name], MD;  [facility];  Service: Endoscopy;  Laterality: N/A;  . EYE SURGERY     Catracts removed both eye 30 yrs ago  . HERNIA REPAIR     right and left  . HOLMIUM LASER APPLICATION Left [id number]   Procedure: HOLMIUM LASER APPLICATION;  Surgeon: Franchot Gallo, MD;  [facility];  Service: Urology;  Laterality: Left;  Marland Kitchen MALONEY DILATION N/A 09/06/2016   Procedure: Venia Minks DILATION;  Surgeon: [name], MD;  [facility];  Service: Endoscopy;  Laterality: N/A;  Venia Minks DILATION N/A 08/26/2018   Procedure: Venia Minks DILATION;  Surgeon: [name], MD;  [facility];  Service: Endoscopy;  Laterality: N/A;  . POLYPECTOMY  08/26/2018   Procedure: POLYPECTOMY;  Surgeon: [name], MD;  [facility];  Service: Endoscopy;;  polyp at descending colon x2  . TONSILLECTOMY     Family History  Problem Relation Age of Onset  . Cancer Mother        lung  . Thyroid disease Mother   . Diabetes Father   . GI Bleed Father   . Aneurysm Father   . AAA  (abdominal aortic aneurysm) Father   . Colon cancer Neg Hx   . Colon polyps Neg Hx   . Rectal cancer Neg Hx   . Stomach cancer Neg Hx    Social History   Socioeconomic History  . Marital status: Married    Spouse name: Not on file  . Number of children: 3  . Years of education: 16  . Highest education level: Bachelor's degree (e.g., BA, AB, BS)  Occupational History  . Occupation: professor  Tobacco Use  . Smoking status: Former Smoker    Quit date: 07/24/1964    Years since quitting: 55.0  . Smokeless tobacco: Never Used  . Tobacco comment: Quit 20-30 years ago; smoked pipe and cigars   Substance and Sexual Activity  . Alcohol use: Yes    Alcohol/week: 2.0 standard drinks    Types: 2 Cans of beer  per week    Comment: 2 per day  . Drug use: No  . Sexual activity: Not on file  Other Topics Concern  . Not on file  Social History Narrative   Daily Caffeine use: 5-6 drinks daily      08/07/2018: No drug use, some daily alcohol use at night but patient does not feel like it disrupts his health or is a safety concern.       Lives with wife in two story home; three sons, five grandchildren in [place] area.   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             Social Determinants of Health   Financial Resource Strain: Low Risk   . Difficulty of Paying Living Expenses: Not very hard  Food Insecurity: No Food Insecurity  . Worried About Charity fundraiser in the Last Year: Never true  . Ran Out of Food in the Last Year: Never true  Transportation Needs: No Transportation Needs  . Lack of Transportation (Medical): No  . Lack of Transportation (Non-Medical): No  Physical Activity: Insufficiently Active  . Days of Exercise per Week: 4 days  . Minutes of Exercise per Session: 20 min  Stress: Stress Concern Present  . Feeling of Stress : To some extent  Social Connections: Not Isolated  . Frequency of  Communication with Friends and Family: More than three times a week  . Frequency of Social Gatherings with Friends and Family: Once a week  . Attends Religious Services: More than 4 times per year  . Active Member of Clubs or Organizations: Yes  . Attends Archivist Meetings: More than 4 times per year  . Marital Status: Married    Outpatient Encounter Medications as of 08/15/2019  Medication Sig  . amLODipine (NORVASC) 5 MG tablet TAKE 1 TABLET BY MOUTH EVERY DAY (due FOR physical)  . aspirin 325 MG tablet Take 325 mg by mouth every 4 (four) hours as needed for mild pain.  . cetirizine (ZYRTEC) 10 MG tablet Take 10 mg by mouth daily as needed for allergies.   Marland Kitchen CRANBERRY POWDER PO Take by mouth. 15,000 UNITS DAILY  . Cyanocobalamin (VITAMIN B-12 PO) Take 1 tablet by mouth daily.   . diphenhydrAMINE HCl (BENADRYL ALLERGY PO) Take 1 tablet by mouth daily.  Marland Kitchen dutasteride (AVODART) 0.5 MG capsule TAKE ONE CAPSULE BY MOUTH DAILY  . fluticasone (FLONASE) 50 MCG/ACT nasal spray INSTILL TWO SPRAYS IN EACH NOSTRIL DAILY AS NEEDED FOR ALLERGIES  . hydrocortisone cream 1 % Apply 1 application topically daily as needed for itching.  . lansoprazole (PREVACID) 30 MG capsule Take 1 capsule (30 mg total) by mouth 2 (two) times daily before a meal.  . Multiple Vitamin (ONE DAILY) tablet Take by mouth.  . Simethicone (GAS-X PO) Take 1 tablet by mouth daily as needed.   . vitamin C (ASCORBIC ACID) 500 MG tablet Take 500 mg by mouth daily.  Marland Kitchen desonide (DESOWEN) 0.05 % cream Apply 1 application topically daily as needed (rash).   . fluconazole (DIFLUCAN) 150 MG tablet TAKE 1 TABLET BY MOUTH ONCE  . gentamicin cream (GARAMYCIN) 0.1 % Apply 1 application topically 2 (two) times daily. (Patient not taking: Reported on 08/15/2019)  . nystatin (MYCOSTATIN/NYSTOP) powder Apply topically 3 (three) times daily. (Patient not taking: Reported on 08/15/2019)  . nystatin cream (MYCOSTATIN) APPLY TO THE AFFECTED  AREA(S) TWICE DAILY (Patient not taking:  Reported on 08/15/2019)  . tamsulosin (FLOMAX) 0.4 MG CAPS capsule Take 0.8 mg by mouth daily.   No facility-administered encounter medications on file as of 08/15/2019.    Activities of Daily Living In your present state of health, do you have any difficulty performing the following activities: 08/15/2019 08/19/2018  Hearing? N N  Vision? N N  Difficulty concentrating or making decisions? N N  Walking or climbing stairs? N N  Dressing or bathing? N N  Doing errands, shopping? N N  Preparing Food and eating ? N -  Using the Toilet? N -  In the past six months, have you accidently leaked urine? N -  Do you have problems with loss of bowel control? N -  Managing your Medications? N -  Managing your Finances? N -  Housekeeping or managing your Housekeeping? N -  Some recent data might be hidden    Patient Care Team: Dorothyann Peng, NP as PCP - General (Family Medicine) Herminio Commons, MD as PCP - Cardiology (Cardiology) Grace Isaac, MD as Consulting Physician (Cardiothoracic Surgery) Thompson Grayer, MD as Consulting Physician (Cardiology) Herminio Commons, MD as Attending Physician (Cardiology) Susa Day, MD as Consulting Physician (Orthopedic Surgery) Lucas Mallow, MD as Consulting Physician (Urology) Jola Schmidt, MD as Consulting Physician (Ophthalmology) Gala Romney Cristopher Estimable, MD as Consulting Physician (Gastroenterology)   Assessment:   This is a routine wellness examination for [place].  Exercise Activities and Dietary recommendations Current Exercise Habits: Home exercise routine, Type of exercise: walking, Time (Minutes): 20, Frequency (Times/Week): 4, Weekly Exercise (Minutes/Week): 80, Intensity: Mild, Exercise limited by: None identified  Goals    . Patient Stated     To continue walking this year Walks outdoors Baseline; try to increase up to 30 minutes 5 days a week      . Patient Stated     Keep doing  what I love-teach!        Fall Risk Fall Risk  08/15/2019 08/07/2018 07/27/2017 10/19/2015 12/18/2013  Falls in the past year? 0 0 Yes No No  Number falls in past yr: 0 - 1 - -  Injury with Fall? - - No - -  Comment - - had meinere's  - -  Risk for fall due to : Medication side effect - - - -  Follow up Falls evaluation completed;Education provided;Falls prevention discussed Falls prevention discussed;Education provided Education provided - -   Is the patient's home free of loose throw rugs in walkways, pet beds, electrical cords, etc?   yes      Grab bars in the bathroom? yes      Handrails on the stairs?   yes      Adequate lighting?   yes  Timed Get Up and Go Performed: N/A due to telephone visit  Depression Screen PHQ 2/9 Scores 08/15/2019 08/07/2018 07/27/2017 10/19/2015  PHQ - 2 Score 0 1 0 0  PHQ- 9 Score - 2 - -    Cognitive Function MMSE - Mini Mental State Exam 07/27/2017  Not completed: (No Data)     6CIT Screen 08/15/2019  What Year? 0 points  What month? 0 points  What time? 0 points  Count back from 20 0 points  Months in reverse 0 points  Repeat phrase 0 points  Total Score 0    Immunization History  Administered Date(s) Administered  . Hepatitis A 09/28/2017  . Influenza, High Dose Seasonal PF 04/14/2016  . Influenza,inj,Quad PF,6+ Mos 04/16/2014, 05/01/2018  .  Influenza,inj,quad, With Preservative 05/22/2017  . Influenza-Unspecified 05/25/2015, 05/24/2017, 04/08/2019  . Pneumococcal Conjugate-13 04/14/2016  . Pneumococcal Polysaccharide-23 04/23/2008, 12/18/2013, 05/01/2018  . Td 12/13/2007  . Tdap 09/28/2017  . Zoster Recombinat (Shingrix) 08/10/2017, 12/06/2017    Qualifies for Shingles Vaccine? Patient has completed  Screening Tests Health Maintenance  Topic Date Due  . URINE MICROALBUMIN  12/12/1955  . OPHTHALMOLOGY EXAM  08/11/2017  . FOOT EXAM  08/08/2019  . HEMOGLOBIN A1C  08/23/2019  . COLONOSCOPY  08/26/2021  . TETANUS/TDAP  09/29/2027  .  INFLUENZA VACCINE  Completed  . Hepatitis C Screening  Completed  . PNA vac Low Risk Adult  Completed   Cancer Screenings: Lung: Low Dose CT Chest recommended if Age 6-80 years, 30 pack-year currently smoking OR have quit w/in 15years. Patient does not qualify. Colorectal: Yes  Additional Screenings:  Hepatitis C Screening:completed 02/20/2019.      Plan:   Mr. Trick is currently up to date with preventive health screenings and immunizations.   I have personally reviewed and noted the following in the patient's chart:   . Medical and social history . Use of alcohol, tobacco or illicit drugs  . Current medications and supplements . Functional ability and status . Nutritional status . Physical activity . Advanced directives . List of other physicians . Hospitalizations, surgeries, and ER visits in previous 12 months . Vitals . Screenings to include cognitive, depression, and falls . Referrals and appointments  In addition, I have reviewed and discussed with patient certain preventive protocols, quality metrics, and best practice recommendations. A written personalized care plan for preventive services as well as general preventive health recommendations were provided to patient.     Daniel Forts, LPN  [id number]

## 2019-08-20 ENCOUNTER — Other Ambulatory Visit: Payer: Self-pay

## 2019-08-20 DIAGNOSIS — Z91010 Allergy to peanuts: Secondary | ICD-10-CM

## 2019-08-28 ENCOUNTER — Other Ambulatory Visit: Payer: Self-pay | Admitting: *Deleted

## 2019-08-28 DIAGNOSIS — I712 Thoracic aortic aneurysm, without rupture, unspecified: Secondary | ICD-10-CM

## 2019-09-07 ENCOUNTER — Ambulatory Visit: Payer: Medicare Other | Attending: Internal Medicine

## 2019-09-07 DIAGNOSIS — Z23 Encounter for immunization: Secondary | ICD-10-CM

## 2019-09-07 NOTE — Progress Notes (Signed)
   Covid-19 Vaccination Clinic  Name:  Daniel Guerrero    MRN: [id number] DOB: 12/31/1945  09/07/2019  Mr. Pavlock was observed post Covid-19 immunization for 30 minutes based on pre-vaccination screening without incidence. He was provided with Vaccine Information Sheet and instruction to access the V-Safe system.   Mr. Gary was instructed to call 911 with any severe reactions post vaccine: Marland Kitchen Difficulty breathing  . Swelling of your face and throat  . A fast heartbeat  . A bad rash all over your body  . Dizziness and weakness    Immunizations Administered    Name Date Dose VIS Date Route   Pfizer COVID-19 Vaccine 09/07/2019 12:53 PM 0.3 mL 07/04/2019 Intramuscular   Manufacturer: [place]   Lot: [id number]   [place]: [id number]

## 2019-09-30 ENCOUNTER — Ambulatory Visit: Payer: Medicare Other | Attending: Internal Medicine

## 2019-09-30 DIAGNOSIS — Z23 Encounter for immunization: Secondary | ICD-10-CM | POA: Insufficient documentation

## 2019-09-30 NOTE — Progress Notes (Signed)
   Covid-19 Vaccination Clinic  Name:  Daniel Guerrero    MRN: [id number] DOB: [birth year]  09/30/2019  [name] was observed post Covid-19 immunization for 15 minutes without incident. He was provided with Vaccine Information Sheet and instruction to access the V-Safe system.   Daniel Guerrero was instructed to call 911 with any severe reactions post vaccine: Marland Kitchen Difficulty breathing  . Swelling of face and throat  . A fast heartbeat  . A bad rash all over body  . Dizziness and weakness   Immunizations Administered    Name Date Dose VIS Date Route   Pfizer COVID-19 Vaccine 09/30/2019  2:27 PM 0.3 mL 07/04/2019 Intramuscular   Manufacturer: [place]   Lot: [id number]   [place]: [id number]

## 2019-10-06 ENCOUNTER — Encounter: Payer: Self-pay | Admitting: Family Medicine

## 2019-10-06 ENCOUNTER — Telehealth (INDEPENDENT_AMBULATORY_CARE_PROVIDER_SITE_OTHER): Payer: Medicare Other | Admitting: Family Medicine

## 2019-10-06 VITALS — Ht 70.0 in | Wt 205.0 lb

## 2019-10-06 DIAGNOSIS — B029 Zoster without complications: Secondary | ICD-10-CM | POA: Diagnosis not present

## 2019-10-06 MED ORDER — VALACYCLOVIR HCL 1 G PO TABS: 1000.0000 mg | ORAL_TABLET | Freq: Three times a day (TID) | ORAL | 0 refills | Status: DC

## 2019-10-06 NOTE — Progress Notes (Signed)
Virtual Visit via Telephone Note  I connected with the patient on 10/06/19 at  1:45 PM EDT by telephone and verified that I am speaking with the correct person using two identifiers.   I discussed the limitations, risks, security and privacy concerns of performing an evaluation and management service by telephone and the availability of in person appointments. I also discussed with the patient that there may be a patient responsible charge related to this service. The patient expressed understanding and agreed to proceed.  Location patient: home Location provider: work or home office Participants present for the call: patient, provider Patient did not have a visit in the prior 7 days to address this/these issue(s).   History of Present Illness: Here for what he thinks is another bout of shingles. This started about 2 am this morning and he had a band of itching burning red blisters on the lower abdomen and in the right groin. This actually has cleared up a bit as the day has gone by. Her took some Benadryl.    Observations/Objective: Patient sounds cheerful and well on the phone. I do not appreciate any SOB. Speech and thought processing are grossly intact. Patient reported vitals:  Assessment and Plan: Shingles, treat with Valtrex for 10 days.  Alysia Penna, MD   Follow Up Instructions:     [id number] 5-10 [phone] 11-20 [id number] 21-30 I did not refer this patient for an OV in the next 24 hours for this/these issue(s).  I discussed the assessment and treatment plan with the patient. The patient was provided an opportunity to ask questions and all were answered. The patient agreed with the plan and demonstrated an understanding of the instructions.   The patient was advised to call back or seek an in-person evaluation if the symptoms worsen or if the condition fails to improve as anticipated.  I provided 13 minutes of non-face-to-face time during this encounter.   Alysia Penna, MD

## 2019-10-16 ENCOUNTER — Other Ambulatory Visit: Payer: Self-pay

## 2019-10-16 ENCOUNTER — Ambulatory Visit (INDEPENDENT_AMBULATORY_CARE_PROVIDER_SITE_OTHER): Payer: Medicare Other | Admitting: Cardiothoracic Surgery

## 2019-10-16 ENCOUNTER — Ambulatory Visit
Admission: RE | Admit: 2019-10-16 | Discharge: 2019-10-16 | Disposition: A | Discharge status: Home / Self Care | Payer: Medicare Other | Source: Ambulatory Visit | Attending: Cardiothoracic Surgery | Admitting: Cardiothoracic Surgery

## 2019-10-16 VITALS — BP 150/79 | HR 72 | Temp 97.7°F | Resp 20 | Ht 70.0 in | Wt 205.0 lb

## 2019-10-16 DIAGNOSIS — I712 Thoracic aortic aneurysm, without rupture, unspecified: Secondary | ICD-10-CM

## 2019-10-16 DIAGNOSIS — I77810 Thoracic aortic ectasia: Secondary | ICD-10-CM | POA: Diagnosis not present

## 2019-10-16 MED ORDER — IOPAMIDOL (ISOVUE-370) INJECTION 76%
75.0000 mL | Freq: Once | INTRAVENOUS | Status: AC | PRN
  Administered 2019-10-16: 75 mL via INTRAVENOUS

## 2019-10-16 NOTE — Progress Notes (Signed)
[address]       [place] [address]             [phone]                    Traven S Jenne Dutton Medical Record [id number] Date of Birth: 27-Feb-1946  Referring: Thompson Grayer, MD Primary Care: Dorothyann Peng, NP  Chief Complaint:    Mildly dilated aortic root   History of Present Illness:    Daniel Guerrero 74 y.o. male is seen in the office  Today  for follow-up of mildly dilated aortic root found incidentally on an echocardiogram.   Patient notes that he's had episodes of fast heart rate since his teens.  Dr. Rayann Heman performed cardiac ablation 2017 .  His work-up prior to ablation an echocardiogram was performed. Echo 06/10/15 demonstrated EF 60-65%, mildly to moderately dilated aortic root, LA 40.  Because of the mildly dilated aortic root on echocardiogram a CTA of the chest was performed and the patient was referred to cardiac surgery 4 years ago  His echocardiogram demonstrates a trileaflet aortic valve.   He has no family history of aortic dissection or sudden death at a young age of unexplained cause. His father died from  ruptured abdominal aneurysm at age 44, and nonoperative treatment was decided upon.   He was recently seen by vascular surgery for follow-up of abdominal aortic aneurysm and bilateral popliteal artery aneurysms- duplex scan done 2019    Current Activity/ Functional Status:  Patient is independent with mobility/ambulation, transfers, ADL's, IADL's.   Zubrod Score: At the time of surgery this patient's most appropriate activity status/level should be described as: []     0    Normal activity, no symptoms [x]     1    Restricted in physical strenuous activity but ambulatory, able to do out light work []     2    Ambulatory and capable of self care, unable to do work activities, up and about               >50 % of waking hours                              []     3    Only limited self care, in bed greater than 50% of  waking hours []     4    Completely disabled, no self care, confined to bed or chair []     5    Moribund   Past Medical History:  Diagnosis Date  . Allergy   . BPH (benign prostatic hyperplasia)   . Cataract of both eyes    implants both eyes   . Eczema   . Enlarged aorta ([facility])   . Erectile dysfunction   . Hearing loss    high frequency  . History of inguinal hernia repair, bilateral   . Hypertension   . Kidney stones   . Loss of hearing    high frequency  . Meniere disease   . Paroxysmal SVT (supraventricular tachycardia) ([place])   . Psoriasis   . S/P appendectomy   . S/P tonsillectomy   . Seasonal allergies   . SVT (supraventricular tachycardia) ([facility])     Past Surgical History:  Procedure Laterality Date  . Ablasion    . APPENDECTOMY    . CARDIAC ELECTROPHYSIOLOGY [place] AND ABLATION  2015  . CHOLECYSTECTOMY    .  COLONOSCOPY     unsure where or when- states greater than 10 yrs ago per pt- was normal per pt and his wife   . COLONOSCOPY WITH PROPOFOL N/A 08/26/2018   Procedure: COLONOSCOPY WITH PROPOFOL;  Surgeon: [name], MD;  [facility];  Service: Endoscopy;  Laterality: N/A;  10:00am  . CYSTOSCOPY WITH RETROGRADE PYELOGRAM, URETEROSCOPY AND STENT PLACEMENT Left 12/30/2014   Procedure: CYSTOSCOPY WITH LEFT URETEROSCOPY STONE EXTRACTION WITH STENT;  Surgeon: Franchot Gallo, MD;  Location: [facility];  Service: Urology;  Laterality: Left;  . ELECTROPHYSIOLOGIC STUDY N/A 08/24/2015   Procedure: SVT Ablation;  Surgeon: Thompson Grayer, MD;  Location: [facility];  Service: Cardiovascular;  Laterality: N/A;  . ENDOVENOUS ABLATION SAPHENOUS VEIN W/ LASER Right 12/04/2016   endovenous laser ablation right greater saphenous vein by Tinnie Gens MD   . ESOPHAGOGASTRODUODENOSCOPY N/A 08/18/2016   Procedure: ESOPHAGOGASTRODUODENOSCOPY (EGD);  Surgeon: [name], MD;  [facility];  Service: Endoscopy;  Laterality: N/A;  .  ESOPHAGOGASTRODUODENOSCOPY N/A 09/06/2016   Procedure: ESOPHAGOGASTRODUODENOSCOPY (EGD);  Surgeon: [name], MD;  [facility];  Service: Endoscopy;  Laterality: N/A;  100 - moved to 2/14 @ 2:15 per Tretha Sciara  . ESOPHAGOGASTRODUODENOSCOPY (EGD) WITH PROPOFOL N/A 08/26/2018   Procedure: ESOPHAGOGASTRODUODENOSCOPY (EGD) WITH PROPOFOL;  Surgeon: [name], MD;  [facility];  Service: Endoscopy;  Laterality: N/A;  . EYE SURGERY     Catracts removed both eye 30 yrs ago  . HERNIA REPAIR     right and left  . HOLMIUM LASER APPLICATION Left [id number]   Procedure: HOLMIUM LASER APPLICATION;  Surgeon: Franchot Gallo, MD;  [facility];  Service: Urology;  Laterality: Left;  Marland Kitchen MALONEY DILATION N/A 09/06/2016   Procedure: Venia Minks DILATION;  Surgeon: [name], MD;  [facility];  Service: Endoscopy;  Laterality: N/A;  Venia Minks DILATION N/A 08/26/2018   Procedure: Venia Minks DILATION;  Surgeon: [name], MD;  [facility];  Service: Endoscopy;  Laterality: N/A;  . POLYPECTOMY  08/26/2018   Procedure: POLYPECTOMY;  Surgeon: [name], MD;  [facility];  Service: Endoscopy;;  polyp at descending colon x2  . TONSILLECTOMY      Family History  Problem Relation Age of Onset  . Cancer Mother        lung  . Thyroid disease Mother   . Diabetes Father   . GI Bleed Father   . Aneurysm Father   . AAA (abdominal aortic aneurysm) Father   . Colon cancer Neg Hx   . Colon polyps Neg Hx   . Rectal cancer Neg Hx   . Stomach cancer Neg Hx    Patient's mother died at age 85 of lung cancer he has one sister and one brother are healthy Social History   Social History  . Marital Status: Married    Spouse Name: N/A  . Number of Children: N/A  . Years of Education: N/A   Occupational History  .  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 Smoking status: Former Research scientist (life sciences)    . Smokeless tobacco: Never Used     Comment: Quit 20-30 years ago  . Alcohol Use: 25.2 oz/week    7 Glasses of wine, 28 Cans of beer, 7 Shots of liquor per week     Comment: occasional  . Drug Use: No  .  Sexual Activity: Not on file          Social History Narrative   Daily Caffeine use: 5-6 drinks daily   Exercising 2 times per week   No Drug use             Social History   Tobacco Use  Smoking Status Former Smoker  . Quit date: 07/24/1964  . Years since quitting: 55.2  Smokeless Tobacco Never Used  Tobacco Comment   Quit 20-30 years ago; smoked pipe and cigars     Social History   Substance and Sexual Activity  Alcohol Use Yes  . Alcohol/week: 2.0 standard drinks  . Types: 2 Cans of beer per week   Comment: 2 per day     Allergies  Allergen Reactions  . Peanut-Containing Drug Products Hives  . Penicillins Hives    Has patient had a PCN reaction causing immediate rash, facial/tongue/throat swelling, SOB or lightheadedness with hypotension: No Has patient had a PCN reaction causing severe rash involving mucus membranes or skin necrosis: No Has patient had a PCN reaction that required hospitalization No Has patient had a PCN reaction occurring within the last 10 years: No If all of the above answers are "NO", then may proceed with Cephalosporin use.  . Quinolones     Patient was warned about not using Cipro and similar antibiotics. Recent studies have raised concern that fluoroquinolone antibiotics could be associated with an increased risk of aortic aneurysm Fluoroquinolones have non-antimicrobial properties that might jeopardise the integrity of the extracellular matrix of the vascular wall In a  propensity score matched cohort study in [place], there was a 66% increased rate of aortic aneurysm or dissection associated with oral fluoroquinolone use, compared wit    Current Outpatient Medications  Medication Sig Dispense Refill  . amLODipine (NORVASC) 5 MG  tablet TAKE 1 TABLET BY MOUTH EVERY DAY (due FOR physical) 90 tablet 3  . aspirin 325 MG tablet Take 325 mg by mouth every 4 (four) hours as needed for mild pain.    . cetirizine (ZYRTEC) 10 MG tablet Take 10 mg by mouth daily as needed for allergies.     Marland Kitchen CRANBERRY POWDER PO Take by mouth. 15,000 UNITS DAILY    . Cyanocobalamin (VITAMIN B-12 PO) Take 1 tablet by mouth daily.     Marland Kitchen desonide (DESOWEN) 0.05 % cream Apply 1 application topically daily as needed (rash).     . diphenhydrAMINE HCl (BENADRYL ALLERGY PO) Take 1 tablet by mouth daily.    Marland Kitchen dutasteride (AVODART) 0.5 MG capsule TAKE ONE CAPSULE BY MOUTH DAILY 90 capsule 3  . EPINEPHrine 0.3 mg/0.3 mL IJ SOAJ injection Inject 0.3 mLs (0.3 mg total) into the muscle as needed for anaphylaxis. 1 each 1  . fluticasone (FLONASE) 50 MCG/ACT nasal spray INSTILL TWO SPRAYS IN EACH NOSTRIL DAILY AS NEEDED FOR ALLERGIES 48 g 1  . hydrocortisone cream 1 % Apply 1 application topically daily as needed for itching.    . lansoprazole (PREVACID) 30 MG capsule Take 1 capsule (30 mg total) by mouth 2 (two) times daily before a meal. 180 capsule 3  . Multiple Vitamin (ONE DAILY) tablet Take by mouth.    . Simethicone (GAS-X PO) Take 1 tablet by mouth daily as needed.     . tamsulosin (FLOMAX) 0.4 MG CAPS capsule Take 0.8 mg by mouth daily.    . valACYclovir (VALTREX) 1000 MG tablet Take 1 tablet (1,000 mg total) by mouth 3 (three) times daily.  30 tablet 0  . vitamin C (ASCORBIC ACID) 500 MG tablet Take 500 mg by mouth daily.     No current facility-administered medications for this visit.      Review of Systems:     Cardiac Review of Systems: Y or N  Chest Pain [ N  ]  Resting SOB Aqua.Slicker  ] Exertional SOB  [Y ]  Vertell Limber Aqua.Slicker ]   Pedal Edema Aqua.Slicker  ]    Palpitations Jazmín.Cullens ] Syncope  [one episode  ]   Presyncope [ N ]  General Review of Systems: [Y] = yes [  ]=no Constitional: recent weight change [  ];  Wt loss over the last 3 months [   ] anorexia [  ];  fatigue [  ]; nausea [  ]; night sweats [  ]; fever [  ]; or chills [  ];          Dental: poor dentition[  ]; Last Dentist visit:   Eye : blurred vision [  ]; diplopia [   ]; vision changes [  ];  Amaurosis fugax[  ]; Resp: cough [  ];  wheezing[  ];  hemoptysis[  ]; shortness of breath[  ]; paroxysmal nocturnal dyspnea[  ]; dyspnea on exertion[  ]; or orthopnea[  ];  GI:  gallstones[  ], vomiting[  ];  dysphagia[  ]; melena[  ];  hematochezia [  ]; heartburn[  ];   Hx of  Colonoscopy[ 10 years ago ]; GU: kidney stones [  ]; hematuria[  ];   dysuria [  ];  nocturia[  ];  history of     obstruction [  ]; urinary frequency [  ]             Skin: rash, swelling[  ];, hair loss[  ];  peripheral edema[  ];  or itching[  ]; Musculosketetal: myalgias[  ];  joint swelling[  ];  joint erythema[  ];  joint pain[  ];  back pain[  ];  Heme/Lymph: bruising[  ];  bleeding[  ];  anemia[  ];  Neuro: TIA[  ];  headaches[  ];  stroke[  ];  vertigo[  ];  seizures[  ];   paresthesias[  ];  difficulty walking[  ];  Psych:depression[  ]; anxiety[  ];  Endocrine: diabetes[  ];  thyroid dysfunction[  ];  Immunizations: Flu up to date [ y ]; Pneumococcal up to date [ n ];  Other:  Physical Exam: BP (!) 150/79   Pulse 72   Temp 97.7 F (36.5 C) (Skin)   Resp 20   Ht 5\' 10"  (1.778 m)   Wt 93 kg   SpO2 93%   BMI 29.41 kg/m   PHYSICAL EXAMINATION: General appearance: alert, cooperative and appears older than stated age Head: Normocephalic, without obvious abnormality, atraumatic Neck: no adenopathy, no carotid bruit, no JVD, supple, symmetrical, trachea midline and thyroid not enlarged, symmetric, no tenderness/mass/nodules Lymph nodes: Cervical, supraclavicular, and axillary nodes normal. Resp: clear to auscultation bilaterally Back: symmetric, no curvature. ROM normal. No CVA tenderness. Cardio: regular rate and rhythm, S1, S2 normal, no murmur, click, rub or gallop GI: soft, non-tender; bowel sounds  normal; no masses,  no organomegaly and Moderately obesity is present Extremities: extremities normal, atraumatic, no cyanosis or edema Neurologic: Grossly normal Patient has palpable DP and PT pulses bilaterally, popliteal pulses are enlarged on palpation the right is larger then the left - at least 3 cm  Diagnostic Studies & Laboratory data:     Recent Radiology Findings:  [place] Lower Ext Art Bilat  Result Date: 03/20/2018 CLINICAL DATA:  74 year old male with a history of palpable abnormality of the popliteal pulses. Cardiovascular risk factors include hypertension, family history, tobacco use, known vascular disease EXAM: NONINVASIVE PHYSIOLOGIC VASCULAR STUDY OF BILATERAL LOWER EXTREMITIES TECHNIQUE: Evaluation of both lower extremities was performed at rest, including directed duplex COMPARISON:  None. FINDINGS: Right Lower Extremity: Directed duplex of the right lower extremity demonstrates atherosclerotic changes of the common femoral artery with triphasic waveform. Triphasic profunda femoris.  Triphasic SFA throughout its length. Popliteal artery measures 2.9 cm by duplex ultrasound transversely, 2.4 cm on longitudinal images. Evidence of atherosclerotic plaque and/or thrombus with a triphasic waveform maintained. Triphasic posterior tibial artery, peroneal artery, and anterior tibial artery. Left Lower Extremity: Directed duplex of the left lower extremity demonstrates atherosclerotic changes of the common femoral artery with triphasic waveform. Triphasic profunda femoris. Triphasic SFA throughout the length. Triphasic popliteal artery. Popliteal diameter measures at least 2.3 cm. Triphasic waveform of left tibial arteries. IMPRESSION: Directed duplex demonstrates bilateral popliteal artery aneurysm, on the right measuring 2.9 cm, on the left at least 2.3 cm. Tibial arteries are patent bilaterally, with maintained waveforms. Signed, Dulcy Fanny. Dellia Nims, RPVI [facility]  [facility] Radiology Electronically Signed   By: Corrie Mckusick D.O.   On: 03/20/2018 14:27   Ct Angio Chest Aorta W/cm &/or Wo/cm  Result Date: 03/14/2018 CLINICAL DATA:  Follow-up thoracic aortic aneurysm. EXAM: CT ANGIOGRAPHY CHEST WITH CONTRAST TECHNIQUE: Multidetector CT imaging of the chest was performed using the standard protocol during bolus administration of intravenous contrast. Multiplanar CT image reconstructions and MIPs were obtained to evaluate the vascular anatomy. CONTRAST:  19mL ISOVUE-370 IOPAMIDOL (ISOVUE-370) INJECTION 76% COMPARISON:  CTA chest dated 08/31/2016 and CTA chest dated 07/15/2015. FINDINGS: Cardiovascular: Stable mild dilatation of the aortic root, measuring 4 cm diameter. Ascending thoracic aorta and aortic arch are normal in caliber. Heart size is normal. No pericardial effusion. Scattered coronary artery calcifications. Mediastinum/Nodes: No mass or enlarged lymph nodes within the mediastinum or perihilar regions. Esophagus is unremarkable. Trachea and central bronchi are unremarkable. Lungs/Pleura: Lungs are clear. Upper Abdomen: Limited images of the upper abdomen are unremarkable. Hepatic cysts again noted. Musculoskeletal: Degenerative changes within the lower thoracic spine, mild to moderate in degree. No acute or suspicious osseous finding. Review of the MIP images confirms the above findings. IMPRESSION: 1. Stable mild dilatation of the aortic root, measuring 4 cm diameter. Recommend semi-annual imaging followup by CTA or MRA and referral to cardiothoracic surgery if not already obtained. This recommendation follows 2010 ACCF/AHA/AATS/ACR/ASA/SCA/SCAI/SIR/STS/SVM Guidelines for the Diagnosis and Management of Patients With Thoracic Aortic Disease. Circulation. 2010; 121: e266-e36 2. No acute findings. Aortic aneurysm NOS (ICD10-I71.9). Electronically Signed   By: Franki Cabot M.D.   On: 03/14/2018 10:29   D Ct Angio Chest Aorta W &/or Wo  Contrast  Result Date: 08/31/2016 CLINICAL DATA:  Thoracic aortic aneurysm without rupture. EXAM: CT ANGIOGRAPHY CHEST WITH CONTRAST TECHNIQUE: Multidetector CT imaging of the chest was performed using the standard protocol during bolus administration of intravenous contrast. Multiplanar CT image reconstructions and MIPs were obtained to evaluate the vascular anatomy. Creatinine was obtained on site at [place] at [address] Results: Creatinine 0.9 mg/dL. CONTRAST:  75 mL Isovue 370 COMPARISON:  07/15/2015 FINDINGS: Cardiovascular: Mild dilatation of the aortic root measuring up to 4.3 cm. This is unchanged from the  previous examination. Normal caliber of the mid ascending thoracic aorta measuring roughly 3.1 cm. Proximal descending thoracic aorta measures 3.0 cm and stable. Mid descending thoracic aorta measures 2.9 cm. The descending thoracic aorta is tortuous. There is no evidence for an aortic dissection. Main pulmonary arteries are patent and there is no evidence to suggest a pulmonary embolism. Atherosclerotic calcifications involving the right coronary artery and the LAD. Great vessels are patent. Mediastinum/Nodes: Mild dilatation of the mid esophagus is similar to the previous examination. There is no chest lymphadenopathy. No axillary lymphadenopathy. No significant pericardial fluid. Lungs/Pleura: Trachea and mainstem bronchi are patent. Mild volume loss at the lung bases. There is no evidence for airspace disease or lung consolidation. No significant pleural fluid. Upper Abdomen: Again noted are numerous hypodense lesions in the liver. Findings compatible with multiple hepatic cysts. Mild thickening of the left adrenal gland could represent hyperplasia. Musculoskeletal: Disc space narrowing and degenerative changes in the lower cervical spine. Bridging osteophytes in lower thoracic spine. Review of the MIP images confirms the above findings. IMPRESSION: Stable dilatation of the  aortic root, measuring up to 4.3 cm. Coronary artery calcifications. No acute chest abnormality. Multiple hepatic cysts. Electronically Signed   By: Markus Daft M.D.   On: 08/31/2016 14:26    Ct Angio Chest Aorta W/cm &/or Wo/cm  07/15/2015  CLINICAL DATA:  Evaluate aortic root dilatation pre EXAM: CT ANGIOGRAPHY CHEST WITH CONTRAST TECHNIQUE: Multidetector CT imaging of the chest was performed using the standard protocol during bolus administration of intravenous contrast. Multiplanar CT image reconstructions and MIPs were obtained to evaluate the vascular anatomy. CONTRAST:  110mL OMNIPAQUE IOHEXOL 350 MG/ML SOLN COMPARISON:  08/02/2010 FINDINGS: Lungs are clear.  Airways are normal. Heart size is normal. There is calcified plaque over the left anterior descending and right coronary arteries. Minimal calcified plaque over the thoracic aorta. The aortic root measures 3.9 cm on the coronal reformatted images and 4.2 cm AP diameter on the axial images which is borderline/mildly dilated. Aorta at the sinotubular junction measures 3.1 cm compatible with minimal ectasia. Mid ascending thoracic aorta measures 3 cm which is normal. The aortic arch and descending thoracic aorta are within normal in caliber. There is no evidence of dissection. No evidence of hilar, mediastinal or axillary adenopathy. Remaining mediastinal structures are within normal. Images through the upper abdomen demonstrate multiple well-defined hypodensities within the liver compatible with cysts. There are minimal degenerative changes of the spine. Review of the MIP images confirms the above findings. IMPRESSION: No acute cardiopulmonary disease. Borderline dilatation of the aortic root measuring 3.9-4.2 cm. Ascending thoracic aorta, aortic arch as well as descending thoracic aorta are within normal in caliber. Recommend semi-annual imaging followup by CTA or MRA and referral to cardiothoracic surgery if not already obtained. This recommendation  follows 2010 ACCF/AHA/AATS/ACR/ASA/SCA/SCAI/SIR/STS/SVM Guidelines for the Diagnosis and Management of Patients With Thoracic Aortic Disease. Circulation. 2010; 121: e266-e36. Atherosclerotic coronary artery disease. Multiple liver cysts. Electronically Signed   By: Marin Olp M.D.   On: 07/15/2015 17:26     I have independently reviewed the above radiology studies  and reviewed the findings with the patient.   Recent Lab Findings: Lab Results  Component Value Date   WBC 5.1 02/20/2019   HGB 14.3 02/20/2019   HCT 41.7 02/20/2019   PLT 187.0 02/20/2019   GLUCOSE 119 (H) 02/20/2019   CHOL 169 02/20/2019   TRIG 139.0 02/20/2019   HDL 42.10 02/20/2019   LDLCALC 99 02/20/2019   ALT 16  02/20/2019   AST 48 (H) 02/20/2019   NA 138 02/20/2019   K 4.2 02/20/2019   CL 103 02/20/2019   CREATININE 0.89 02/20/2019   BUN 17 02/20/2019   CO2 25 02/20/2019   TSH 1.20 02/20/2019   HGBA1C 5.4 02/20/2019   Echocardiogram 08/28/2016 Echocardiography  Patient:    Daniel Guerrero, Daniel Guerrero MR #:       [id number] Study Date: 08/28/2016 Gender:     M Age:        57 Height:     177.8 cm Weight:     101.6 kg BSA:        2.27 m^2 Pt. Status: Room:   ORDERING    Lanelle Bal MD  REFERRING   Lanelle Bal MD  ATTENDING   Candee Furbish, M.D.  PERFORMING  [facility], Outpatient  cc:  ------------------------------------------------------------------- LV EF: 55% -   60%  ------------------------------------------------------------------- Indications:      Ascending aortic aneurysm (I71.2).  ------------------------------------------------------------------- Study Conclusions  - Left ventricle: The cavity size was normal. Wall thickness was   increased in a pattern of moderate LVH. Systolic function was   normal. The estimated ejection fraction was in the range of 55%   to 60%. There is hypokinesis of the anteroseptal and apical   myocardium. Doppler parameters are consistent with abnormal  left   ventricular relaxation (grade 1 diastolic dysfunction). - Aortic valve: There was mild regurgitation. - Aorta: Aortic root dimension: 40 mm (ED), prior echocardiogram   52mm. - Ascending aorta: The ascending aorta was mildly dilated. - Mitral valve: There was trivial regurgitation. - Pulmonary arteries: Systolic pressure was mildly increased. PA   peak pressure: 33 mm Hg (S).  Impressions:  - Compared to the prior study, there has been no significant   interval change.  ------------------------------------------------------------------- Study data:  Comparison was made to the study of 06/10/2015.  Study status:  Routine.  Procedure:  The patient reported no pain pre or post test. Transthoracic echocardiography. Image quality was adequate.          Echocardiography.  M-mode, complete 2D, spectral Doppler, and color Doppler.  Birthdate:  Patient birthdate: [birth year].  Age:  Patient is 74 yr old.  Sex:  Gender: male. BMI: 32.1 kg/m^2.  Blood pressure:     152/71  Patient status: Outpatient.  Study date:  Study date: 08/28/2016. Study time: 09:54 AM.  Location:  [facility]  -------------------------------------------------------------------  ------------------------------------------------------------------- Left ventricle:  The cavity size was normal. Wall thickness was increased in a pattern of moderate LVH. Systolic function was normal. The estimated ejection fraction was in the range of 55% to 60%.  Regional wall motion abnormalities:   There is hypokinesis of the anteroseptal and apical myocardium. Doppler parameters are consistent with abnormal left ventricular relaxation (grade 1 diastolic dysfunction).  ------------------------------------------------------------------- Aortic valve:   Trileaflet; normal thickness leaflets. Mobility was not restricted.  Doppler:  Transvalvular velocity was within the normal range. There was no stenosis. There was mild  regurgitation.   ------------------------------------------------------------------- Aorta:  Ascending aorta: The ascending aorta was mildly dilated.  ------------------------------------------------------------------- Mitral valve:   Structurally normal valve.   Mobility was not restricted.  Doppler:  Transvalvular velocity was within the normal range. There was no evidence for stenosis. There was trivial regurgitation.  ------------------------------------------------------------------- Left atrium:  The atrium was normal in size.  ------------------------------------------------------------------- Right ventricle:  The cavity size was normal. Wall thickness was normal. Systolic function was normal.  ------------------------------------------------------------------- Pulmonic valve:    Doppler:  Transvalvular velocity was within the normal range. There was no evidence for stenosis.  ------------------------------------------------------------------- Tricuspid valve:   Structurally normal valve.    Doppler: Transvalvular velocity was within the normal range. There was no regurgitation.  ------------------------------------------------------------------- Pulmonary artery:   The main pulmonary artery was normal-sized. Systolic pressure was mildly increased.  ------------------------------------------------------------------- Right atrium:  The atrium was normal in size.  ------------------------------------------------------------------- Pericardium:  There was no pericardial effusion.  ------------------------------------------------------------------- Systemic veins: Inferior vena cava: The vessel was normal in size. The respirophasic diameter changes were in the normal range (= 50%), consistent with normal central venous pressure. Diameter: 18 mm.  ------------------------------------------------------------------- Measurements   IVC                                          Value        Reference  ID                                          18    mm     ---------    Left ventricle                              Value        Reference  LV ID, ED, PLAX chordal             (L)     40.6  mm     43 - 52  LV ID, ES, PLAX chordal                     28.1  mm     23 - 38  LV fx shortening, PLAX chordal              31    %      >=29  LV PW thickness, ED                         14.7  mm     ---------  IVS/LV PW ratio, ED                         1            <=1.3  Stroke volume, 2D                           98    ml     ---------  Stroke volume/bsa, 2D                       43    ml/m^2 ---------  LV e&', lateral                              9.76  cm/s   ---------  LV E/e&', lateral                            5.77         ---------  LV e&', medial  6.91  cm/s   ---------  LV E/e&', medial                             8.15         ---------  LV e&', average                              8.34  cm/s   ---------  LV E/e&', average                            6.75         ---------    Ventricular septum                          Value        Reference  IVS thickness, ED                           14.7  mm     ---------    LVOT                                        Value        Reference  LVOT ID, S                                  24    mm     ---------  LVOT area                                   4.52  cm^2   ---------  LVOT peak velocity, S                       82    cm/s   ---------  LVOT mean velocity, S                       59.9  cm/s   ---------  LVOT VTI, S                                 21.7  cm     ---------    Aortic valve                                Value        Reference  Aortic regurg pressure half-time            1620  ms     ---------    Aorta                                       Value        Reference  Aortic root ID, ED  40    mm     ---------    Left atrium                                  Value        Reference  LA ID, A-P, ES                              37    mm     ---------  LA ID/bsa, A-P                              1.63  cm/m^2 <=2.2  LA volume, S                                45    ml     ---------  LA volume/bsa, S                            19.8  ml/m^2 ---------  LA volume, ES, 1-p A4C                      33    ml     ---------  LA volume/bsa, ES, 1-p A4C                  14.5  ml/m^2 ---------  LA volume, ES, 1-p A2C                      54    ml     ---------  LA volume/bsa, ES, 1-p A2C                  23.8  ml/m^2 ---------    Mitral valve                                Value        Reference  Mitral E-wave peak velocity                 56.3  cm/s   ---------  Mitral A-wave peak velocity                 74    cm/s   ---------  Mitral deceleration time                    215   ms     150 - 230  Mitral E/A ratio, peak                      0.8          ---------    Pulmonary arteries                          Value        Reference  PA pressure, S, DP                  (H)     33    mm Hg  <=30    Tricuspid valve  Value        Reference  Tricuspid regurg peak velocity              276   cm/s   ---------  Tricuspid peak RV-RA gradient               30    mm Hg  ---------  Tricuspid maximal regurg velocity,          276   cm/s   ---------  PISA    Systemic veins                              Value        Reference  Estimated CVP                               3     mm Hg  ---------    Right ventricle                             Value        Reference  RV pressure, S, DP                  (H)     33    mm Hg  <=30  RV s&', lateral, S                           9.65  cm/s   ---------  Legend: (L)  and  (H)  mark values outside specified reference range.  ------------------------------------------------------------------- Prepared and Electronically Authenticated by  Candee Furbish, M.D. 2018-02-05T11:43:55  Aortic Size  Index=     4.3    /Body surface area is 2.14 meters squared. = 1.9  < 2.75 cm/m2      4% risk per year 2.75 to 4.25          8% risk per year > 4.25 cm/m2    20% risk per year  cross sectional area of aorta cm2/height in meters > 10 consider  surgery     Assessment / Plan: 1/Stable mild dilatation of the aortic root 4 cm with no change over the past 18 months-we will repeat CTA of the chest in 18 months 2/bilateral popliteal artery aneurysm, enlarged- previous measurement 2019 -  right measuring 2.9 cm, on the left at least 2.3 cm. - referr back to vascular lab for repeat duplex scan of popiteal arteries  3/Status post ablation by Dr. Anastasio Auerbach notes that currently he has no cardiology follow-up.  Seen by cardiology follow-up in   [facility] prn    Grace Isaac MD      [address] [place] [address] Office [phone]   Beeper [phone]  10/17/2019 11:15 AM

## 2019-10-23 ENCOUNTER — Other Ambulatory Visit: Payer: Self-pay

## 2019-10-23 DIAGNOSIS — I714 Abdominal aortic aneurysm, without rupture, unspecified: Secondary | ICD-10-CM

## 2019-11-03 ENCOUNTER — Other Ambulatory Visit: Payer: Self-pay

## 2019-11-03 ENCOUNTER — Ambulatory Visit (HOSPITAL_COMMUNITY)
Admission: RE | Admit: 2019-11-03 | Discharge: 2019-11-03 | Disposition: A | Discharge status: Home / Self Care | Payer: Medicare Other | Source: Ambulatory Visit | Attending: Vascular Surgery | Admitting: Vascular Surgery

## 2019-11-03 ENCOUNTER — Encounter (HOSPITAL_COMMUNITY): Payer: Self-pay

## 2019-11-03 DIAGNOSIS — I714 Abdominal aortic aneurysm, without rupture, unspecified: Secondary | ICD-10-CM

## 2019-11-03 DIAGNOSIS — I724 Aneurysm of artery of lower extremity: Secondary | ICD-10-CM | POA: Diagnosis not present

## 2019-11-03 LAB — POCT I-STAT CREATININE: Creatinine, Ser: 0.9 mg/dL (ref 0.61–1.24)

## 2019-11-03 MED ORDER — IOHEXOL 350 MG/ML SOLN
100.0000 mL | Freq: Once | INTRAVENOUS | Status: AC | PRN
  Administered 2019-11-03: 100 mL via INTRAVENOUS

## 2019-11-11 ENCOUNTER — Ambulatory Visit: Payer: Medicare Other | Admitting: Vascular Surgery

## 2019-12-15 ENCOUNTER — Telehealth: Payer: Self-pay | Admitting: Vascular Surgery

## 2019-12-15 NOTE — Telephone Encounter (Signed)
Patient's wife called to R/S appt with Dr. 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  I scheduled the patient for 02/10/20

## 2019-12-16 ENCOUNTER — Ambulatory Visit: Payer: Medicare Other | Admitting: Vascular Surgery

## 2020-02-10 ENCOUNTER — Ambulatory Visit: Payer: Medicare Other | Admitting: Vascular Surgery

## 2020-02-24 ENCOUNTER — Ambulatory Visit (INDEPENDENT_AMBULATORY_CARE_PROVIDER_SITE_OTHER): Payer: Medicare Other | Admitting: Vascular Surgery

## 2020-02-24 ENCOUNTER — Encounter: Payer: Self-pay | Admitting: Vascular Surgery

## 2020-02-24 ENCOUNTER — Other Ambulatory Visit: Payer: Self-pay

## 2020-02-24 ENCOUNTER — Other Ambulatory Visit: Payer: Self-pay | Admitting: Adult Health

## 2020-02-24 VITALS — BP 159/82 | HR 58 | Temp 97.5°F | Resp 18 | Ht 70.0 in | Wt 214.1 lb

## 2020-02-24 DIAGNOSIS — I724 Aneurysm of artery of lower extremity: Secondary | ICD-10-CM | POA: Diagnosis not present

## 2020-02-24 DIAGNOSIS — I10 Essential (primary) hypertension: Secondary | ICD-10-CM

## 2020-02-24 NOTE — Progress Notes (Signed)
[facility] [place]  Patient name: Daniel Guerrero MRN: [id number] DOB: 1945-12-17 Sex: male  REASON FOR VISIT: Follow-up known bilateral popliteal artery aneurysm  HPI: Daniel Guerrero is a 74 y.o. male here today for follow-up.  He has known bilateral popliteal artery aneurysms.  He has also dilatation of his aortic root which is being followed by Dr. Servando Snare.  He underwent CT scan which I have reviewed with him on April 2021.  He has no symptoms referable to his aneurysm.  He does have diffuse arteriomegaly on CT.  Past Medical History:  Diagnosis Date  . Allergy   . BPH (benign prostatic hyperplasia)   . Cataract of both eyes    implants both eyes   . Eczema   . Enlarged aorta ([place])   . Erectile dysfunction   . Hearing loss    high frequency  . History of inguinal hernia repair, bilateral   . Hypertension   . Kidney stones   . Loss of hearing    high frequency  . Meniere disease   . Paroxysmal SVT (supraventricular tachycardia) ([place])   . Psoriasis   . S/P appendectomy   . S/P tonsillectomy   . Seasonal allergies   . SVT (supraventricular tachycardia) ([facility])     Family History  Problem Relation Age of Onset  . Cancer Mother        lung  . Thyroid disease Mother   . Diabetes Father   . GI Bleed Father   . Aneurysm Father   . AAA (abdominal aortic aneurysm) Father   . Colon cancer Neg Hx   . Colon polyps Neg Hx   . Rectal cancer Neg Hx   . Stomach cancer Neg Hx     SOCIAL HISTORY: Social History   Tobacco Use  . Smoking status: Former Smoker    Quit date: 07/24/1964    Years since quitting: 55.6  . Smokeless tobacco: Never Used  . Tobacco comment: Quit 20-30 years ago; smoked pipe and cigars   Substance Use Topics  . Alcohol use: Yes    Alcohol/week: 2.0 standard drinks    Types: 2 Cans of beer per week    Comment: 2 per day    Allergies  Allergen Reactions  . Peanut-Containing Drug Products  Hives  . Penicillins Hives    Has patient had a PCN reaction causing immediate rash, facial/tongue/throat swelling, SOB or lightheadedness with hypotension: No Has patient had a PCN reaction causing severe rash involving mucus membranes or skin necrosis: No Has patient had a PCN reaction that required hospitalization No Has patient had a PCN reaction occurring within the last 10 years: No If all of the above answers are "NO", then may proceed with Cephalosporin use.  . Quinolones     Patient was warned about not using Cipro and similar antibiotics. Recent studies have raised concern that fluoroquinolone antibiotics could be associated with an increased risk of aortic aneurysm Fluoroquinolones have non-antimicrobial properties that might jeopardise the integrity of the extracellular matrix of the vascular wall In a  propensity score matched cohort study in [place], there was a 66% increased rate of aortic aneurysm or dissection associated with oral fluoroquinolone use, compared wit    Current Outpatient Medications  Medication Sig Dispense Refill  . amLODipine (NORVASC) 5 MG tablet TAKE 1 TABLET BY MOUTH EVERY DAY (due FOR physical) 90 tablet 3  . cetirizine (ZYRTEC) 10 MG tablet Take 10 mg by mouth daily as  needed for allergies.     Marland Kitchen CRANBERRY POWDER PO Take by mouth. 15,000 UNITS DAILY    . Cyanocobalamin (VITAMIN B-12 PO) Take 1 tablet by mouth daily.     Marland Kitchen desonide (DESOWEN) 0.05 % cream Apply 1 application topically daily as needed (rash).     . diphenhydrAMINE HCl (BENADRYL ALLERGY PO) Take 1 tablet by mouth daily.    Marland Kitchen dutasteride (AVODART) 0.5 MG capsule TAKE ONE CAPSULE BY MOUTH DAILY 90 capsule 3  . EPINEPHrine 0.3 mg/0.3 mL IJ SOAJ injection Inject 0.3 mLs (0.3 mg total) into the muscle as needed for anaphylaxis. 1 each 1  . fluticasone (FLONASE) 50 MCG/ACT nasal spray INSTILL TWO SPRAYS IN EACH NOSTRIL DAILY AS NEEDED FOR ALLERGIES 48 g 1  . hydrocortisone cream 1 % Apply 1  application topically daily as needed for itching.    . lansoprazole (PREVACID) 30 MG capsule Take 1 capsule (30 mg total) by mouth 2 (two) times daily before a meal. 180 capsule 3  . loperamide (IMODIUM) 2 MG capsule Take by mouth as needed for diarrhea or loose stools. Patient takes every 2-3 days.    . Multiple Vitamin (ONE DAILY) tablet Take by mouth.    . Simethicone (GAS-X PO) Take 1 tablet by mouth daily as needed.     . tamsulosin (FLOMAX) 0.4 MG CAPS capsule Take 0.8 mg by mouth daily.    . valACYclovir (VALTREX) 1000 MG tablet Take 1 tablet (1,000 mg total) by mouth 3 (three) times daily. 30 tablet 0  . vitamin C (ASCORBIC ACID) 500 MG tablet Take 500 mg by mouth daily.    Marland Kitchen aspirin 325 MG tablet Take 325 mg by mouth every 4 (four) hours as needed for mild pain. (Patient not taking: Reported on 02/24/2020)     No current facility-administered medications for this visit.    REVIEW OF SYSTEMS:  [X]  denotes positive finding, [ ]  denotes negative finding Cardiac  Comments:  Chest pain or chest pressure:    Shortness of breath upon exertion:    Short of breath when lying flat:    Irregular heart rhythm:        Vascular    Pain in calf, thigh, or hip brought on by ambulation:    Pain in feet at night that wakes you up from your sleep:     Blood clot in your veins:    Leg swelling:           PHYSICAL EXAM: Vitals:   02/24/20 1006  BP: (!) 159/82  Pulse: (!) 58  Resp: 18  Temp: (!) 97.5 F (36.4 C)  TempSrc: Temporal  SpO2: 96%  Weight: 214 lb 1.6 oz (97.1 kg)  Height: 5\' 10"  (1.778 m)    GENERAL: The patient is a well-nourished male, in no acute distress. The vital signs are documented above. CARDIOVASCULAR: 2+ radial 2+ femoral 3+ popliteal and 2+ dorsalis pedis pulses bilaterally easily palpable popliteal aneurysms bilaterally PULMONARY: There is good air exchange  MUSCULOSKELETAL: There are no major deformities or cyanosis. NEUROLOGIC: No focal weakness or  paresthesias are detected. SKIN: There are no ulcers or rashes noted. PSYCHIATRIC: The patient has a normal affect.  DATA:  CT scan showed diffuse arteriomegaly.  His aorta is approximately 2.5 cm in the infrarenal portion but this is the size of his aorta with no outpouching.  He does have bilateral popliteal artery aneurysms.  This is 2.8 cm on the right and 2.3 cm on the left with  some mural thrombus present.  Duplex of his popliteal arteries from August 2019 revealed 2.9 cm on the right and 2.3 cm on the left  MEDICAL ISSUES: I discussed these findings at length with the patient.  Explained that he has had no enlargement of his popliteal artery aneurysms.  I did explain symptoms related to this.  Explained symptoms of acute occlusion and the need to present immediately to [facility] for treatment.  Explained there would be low risk of this due to his stable moderately small size.  We will see him again in 1 year with repeat duplex of his popliteal artery aneurysms    Rosetta Posner, MD [facility] Tel [phone] Pager [phone]

## 2020-02-25 NOTE — Telephone Encounter (Signed)
30 DAY SUPPLY SENT TO THE PHARMACY BY E-SCRIBE.  PT DUE FOR CPX.

## 2020-04-05 ENCOUNTER — Other Ambulatory Visit: Payer: Self-pay | Admitting: Adult Health

## 2020-04-05 DIAGNOSIS — N401 Enlarged prostate with lower urinary tract symptoms: Secondary | ICD-10-CM

## 2020-04-16 ENCOUNTER — Other Ambulatory Visit: Payer: Self-pay

## 2020-04-16 DIAGNOSIS — I10 Essential (primary) hypertension: Secondary | ICD-10-CM

## 2020-04-16 DIAGNOSIS — Z23 Encounter for immunization: Secondary | ICD-10-CM | POA: Diagnosis not present

## 2020-04-16 MED ORDER — AMLODIPINE BESYLATE 5 MG PO TABS: ORAL_TABLET | ORAL | 0 refills | Status: DC

## 2020-05-25 ENCOUNTER — Other Ambulatory Visit: Payer: Self-pay | Admitting: Adult Health

## 2020-05-25 DIAGNOSIS — I10 Essential (primary) hypertension: Secondary | ICD-10-CM

## 2020-06-02 ENCOUNTER — Encounter: Payer: Medicare Other | Admitting: Adult Health

## 2020-06-08 ENCOUNTER — Ambulatory Visit (INDEPENDENT_AMBULATORY_CARE_PROVIDER_SITE_OTHER): Payer: Medicare Other | Admitting: Adult Health

## 2020-06-08 ENCOUNTER — Encounter: Payer: Self-pay | Admitting: Adult Health

## 2020-06-08 ENCOUNTER — Other Ambulatory Visit: Payer: Self-pay

## 2020-06-08 VITALS — BP 100/62 | HR 57 | Temp 97.9°F | Ht 68.0 in | Wt 203.2 lb

## 2020-06-08 DIAGNOSIS — N401 Enlarged prostate with lower urinary tract symptoms: Secondary | ICD-10-CM | POA: Diagnosis not present

## 2020-06-08 DIAGNOSIS — K219 Gastro-esophageal reflux disease without esophagitis: Secondary | ICD-10-CM

## 2020-06-08 DIAGNOSIS — I714 Abdominal aortic aneurysm, without rupture, unspecified: Secondary | ICD-10-CM

## 2020-06-08 DIAGNOSIS — R3911 Hesitancy of micturition: Secondary | ICD-10-CM

## 2020-06-08 DIAGNOSIS — E782 Mixed hyperlipidemia: Secondary | ICD-10-CM

## 2020-06-08 DIAGNOSIS — I10 Essential (primary) hypertension: Secondary | ICD-10-CM | POA: Diagnosis not present

## 2020-06-08 MED ORDER — AMLODIPINE BESYLATE 5 MG PO TABS: ORAL_TABLET | ORAL | 3 refills | Status: DC

## 2020-06-08 NOTE — Progress Notes (Signed)
Subjective:    Patient ID: Daniel Guerrero, male    DOB: 1945/08/24, 74 y.o.   MRN: [id number]  HPI Patient presents for yearly preventative medicine examination. He is a pleasant 74 year old male who  has a past medical history of Allergy, BPH (benign prostatic hyperplasia), Cataract of both eyes, Eczema, Enlarged aorta (HCC), Erectile dysfunction, Hearing loss, History of inguinal hernia repair, bilateral, Hypertension, Kidney stones, Loss of hearing, Meniere disease, Paroxysmal SVT (supraventricular tachycardia) ([place]), Psoriasis, S/P appendectomy, S/P tonsillectomy, Seasonal allergies, and SVT (supraventricular tachycardia) ([place]).  Essential hypertension-takes Norvasc 5 mg daily.  He denies chest pain, shortness of breath, dizziness, lightheadedness, or syncopal episodes.  BP Readings from Last 3 Encounters:  06/08/20 100/62  02/24/20 (!) 159/82  10/16/19 (!) 150/79    BPH with nocturia-takes Avodart 0.5 mg and Flomax 0.8 mg daily. Wakes up about 2-3 times a night.   GERD-controlled with Prevacid 30 mg daily. Does see GI and has had Cholecystomy in the past. He has diarrhea once or twice a week.   History of AAA-is followed by Dr. Donnetta Hutching on a yearly basis.  Last CT scan of abdomen was in April 2021.  The CT scan showed diffuse arterial megaly.  His aorta is approximately 2.5 cm in the infrarenal portion but this is the size of his aorta with no outpouching.  He does have bilateral popliteal artery aneurysms.  This is 2.8 cm on the right and 2.3 cm on the left with some mural thrombosis present. He denies symptoms. Reports that his younger brother died yesterday of AAA  Hyperlipidemia - diet controlled.   All immunizations and health maintenance protocols were reviewed with the patient and needed orders were placed. He is up to date on routine vaccinations   Appropriate screening laboratory values were ordered for the patient including screening of hyperlipidemia, renal function and  hepatic function. If indicated by BPH, a PSA was ordered.  Medication reconciliation,  past medical history, social history, problem list and allergies were reviewed in detail with the patient  Goals were established with regard to weight loss, exercise, and  diet in compliance with medications.  He has been eating healthier and trying to stay more active.  He has been able to lose weight over the last 3 months  Wt Readings from Last 3 Encounters:  06/08/20 203 lb 3.2 oz (92.2 kg)  02/24/20 214 lb 1.6 oz (97.1 kg)  10/16/19 205 lb (93 kg)     Review of Systems  Constitutional: Negative.   HENT: Negative.   Eyes: Negative.   Respiratory: Negative.   Cardiovascular: Negative.   Gastrointestinal: Positive for diarrhea. Negative for abdominal distention and constipation.  Endocrine: Negative.   Genitourinary: Negative.   Musculoskeletal: Negative.   Skin: Negative.   Allergic/Immunologic: Negative.   Neurological: Negative.   Hematological: Negative.   Psychiatric/Behavioral: Negative.   All other systems reviewed and are negative.  Past Medical History:  Diagnosis Date  . Allergy   . BPH (benign prostatic hyperplasia)   . Cataract of both eyes    implants both eyes   . Eczema   . Enlarged aorta ([place])   . Erectile dysfunction   . Hearing loss    high frequency  . History of inguinal hernia repair, bilateral   . Hypertension   . Kidney stones   . Loss of hearing    high frequency  . Meniere disease   . Paroxysmal SVT (supraventricular tachycardia) ([place])   . Psoriasis   .  S/P appendectomy   . S/P tonsillectomy   . Seasonal allergies   . SVT (supraventricular tachycardia) (HCC)     Social History   Socioeconomic History  . Marital status: Married    Spouse name: Not on file  . Number of children: 3  . Years of education: 16  . Highest education level: Bachelor's degree (e.g., BA, AB, BS)  Occupational History  . Occupation: professor  Tobacco Use  . Smoking  status: Former Smoker    Quit date: 07/24/1964    Years since quitting: 55.9  . Smokeless tobacco: Never Used  . Tobacco comment: Quit 20-30 years ago; smoked pipe and cigars   Vaping Use  . Vaping Use: Never used  Substance and Sexual Activity  . Alcohol use: Yes    Alcohol/week: 2.0 standard drinks    Types: 2 Cans of beer per week    Comment: 2 per day  . Drug use: No  . Sexual activity: Not on file  Other Topics Concern  . Not on file  Social History Narrative   Daily Caffeine use: 5-6 drinks daily      08/07/2018: No drug use, some daily alcohol use at night but patient does not feel like it disrupts his health or is a safety concern.       Lives with wife in two story home; three sons, five grandchildren in [place] area.   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             Social Determinants of Health   Financial Resource Strain: Low Risk   . Difficulty of Paying Living Expenses: Not very hard  Food Insecurity: No Food Insecurity  . Worried About Charity fundraiser in the Last Year: Never true  . Ran Out of Food in the Last Year: Never true  Transportation Needs: No Transportation Needs  . Lack of Transportation (Medical): No  . Lack of Transportation (Non-Medical): No  Physical Activity: Insufficiently Active  . Days of Exercise per Week: 4 days  . Minutes of Exercise per Session: 20 min  Stress: Stress Concern Present  . Feeling of Stress : To some extent  Social Connections: Socially Integrated  . Frequency of Communication with Friends and Family: More than three times a week  . Frequency of Social Gatherings with Friends and Family: Once a week  . Attends Religious Services: More than 4 times per year  . Active Member of Clubs or Organizations: Yes  . Attends Archivist Meetings: More than 4 times per year  . Marital Status: Married  Human resources officer Violence:   . Fear of  Current or Ex-Partner: Not on file  . Emotionally Abused: Not on file  . Physically Abused: Not on file  . Sexually Abused: Not on file    Past Surgical History:  Procedure Laterality Date  . Ablasion    . APPENDECTOMY    . CARDIAC ELECTROPHYSIOLOGY [place] AND ABLATION  2015  . CHOLECYSTECTOMY    . COLONOSCOPY     unsure where or when- states greater than 10 yrs ago per pt- was normal per pt and his wife   . COLONOSCOPY WITH PROPOFOL N/A 08/26/2018   Procedure: COLONOSCOPY WITH PROPOFOL;  Surgeon: [name], MD;  [facility];  Service: Endoscopy;  Laterality: N/A;  10:00am  . CYSTOSCOPY WITH RETROGRADE PYELOGRAM, URETEROSCOPY AND STENT PLACEMENT Left 12/30/2014   Procedure: CYSTOSCOPY WITH LEFT  URETEROSCOPY STONE EXTRACTION WITH STENT;  Surgeon: Franchot Gallo, MD;  Location: [facility];  Service: Urology;  Laterality: Left;  . ELECTROPHYSIOLOGIC STUDY N/A 08/24/2015   Procedure: SVT Ablation;  Surgeon: Thompson Grayer, MD;  Location: [facility];  Service: Cardiovascular;  Laterality: N/A;  . ENDOVENOUS ABLATION SAPHENOUS VEIN W/ LASER Right 12/04/2016   endovenous laser ablation right greater saphenous vein by Tinnie Gens MD   . ESOPHAGOGASTRODUODENOSCOPY N/A 08/18/2016   Procedure: ESOPHAGOGASTRODUODENOSCOPY (EGD);  Surgeon: [name], MD;  [facility];  Service: Endoscopy;  Laterality: N/A;  . ESOPHAGOGASTRODUODENOSCOPY N/A 09/06/2016   Procedure: ESOPHAGOGASTRODUODENOSCOPY (EGD);  Surgeon: [name], MD;  [facility];  Service: Endoscopy;  Laterality: N/A;  100 - moved to 2/14 @ 2:15 per Tretha Sciara  . ESOPHAGOGASTRODUODENOSCOPY (EGD) WITH PROPOFOL N/A 08/26/2018   Procedure: ESOPHAGOGASTRODUODENOSCOPY (EGD) WITH PROPOFOL;  Surgeon: [name], MD;  [facility];  Service: Endoscopy;  Laterality: N/A;  . EYE SURGERY     Catracts removed both eye 30 yrs ago  . HERNIA REPAIR     right and left  . HOLMIUM LASER APPLICATION  Left 4/0/9735   Procedure: HOLMIUM LASER APPLICATION;  Surgeon: Franchot Gallo, MD;  [facility];  Service: Urology;  Laterality: Left;  Marland Kitchen MALONEY DILATION N/A 09/06/2016   Procedure: Venia Minks DILATION;  Surgeon: [name], MD;  [facility];  Service: Endoscopy;  Laterality: N/A;  Venia Minks DILATION N/A 08/26/2018   Procedure: Venia Minks DILATION;  Surgeon: [name], MD;  [facility];  Service: Endoscopy;  Laterality: N/A;  . POLYPECTOMY  08/26/2018   Procedure: POLYPECTOMY;  Surgeon: [name], MD;  [facility];  Service: Endoscopy;;  polyp at descending colon x2  . TONSILLECTOMY      Family History  Problem Relation Age of Onset  . Cancer Mother        lung  . Thyroid disease Mother   . Diabetes Father   . GI Bleed Father   . Aneurysm Father   . AAA (abdominal aortic aneurysm) Father   . Colon cancer Neg Hx   . Colon polyps Neg Hx   . Rectal cancer Neg Hx   . Stomach cancer Neg Hx     Allergies  Allergen Reactions  . Peanut-Containing Drug Products Hives  . Penicillins Hives    Has patient had a PCN reaction causing immediate rash, facial/tongue/throat swelling, SOB or lightheadedness with hypotension: No Has patient had a PCN reaction causing severe rash involving mucus membranes or skin necrosis: No Has patient had a PCN reaction that required hospitalization No Has patient had a PCN reaction occurring within the last 10 years: No If all of the above answers are "NO", then may proceed with Cephalosporin use.  . Quinolones     Patient was warned about not using Cipro and similar antibiotics. Recent studies have raised concern that fluoroquinolone antibiotics could be associated with an increased risk of aortic aneurysm Fluoroquinolones have non-antimicrobial properties that might jeopardise the integrity of the extracellular matrix of the vascular wall In a  propensity score matched cohort study in [place], there was a 66%  increased rate of aortic aneurysm or dissection associated with oral fluoroquinolone use, compared wit    Current Outpatient Medications on File Prior to Visit  Medication Sig Dispense Refill  . amLODipine (NORVASC) 5 MG tablet TAKE 1 TABLET BY MOUTH EVERY DAY -MUST CALL AND SCHEDULE ANNUAL  EXAM 30 tablet 0  . aspirin 325 MG tablet Take 325 mg by mouth every 4 (four) hours as needed for mild pain.     . cetirizine (ZYRTEC) 10 MG tablet Take 10 mg by mouth daily as needed for allergies.     Marland Kitchen CRANBERRY POWDER PO Take by mouth. 15,000 UNITS DAILY    . Cyanocobalamin (VITAMIN B-12 PO) Take 1 tablet by mouth daily.     Marland Kitchen desonide (DESOWEN) 0.05 % cream Apply 1 application topically daily as needed (rash).     . diphenhydrAMINE HCl (BENADRYL ALLERGY PO) Take 1 tablet by mouth daily.    Marland Kitchen dutasteride (AVODART) 0.5 MG capsule TAKE ONE CAPSULE BY MOUTH DAILY 90 capsule 3  . EPINEPHrine 0.3 mg/0.3 mL IJ SOAJ injection Inject 0.3 mLs (0.3 mg total) into the muscle as needed for anaphylaxis. 1 each 1  . fluticasone (FLONASE) 50 MCG/ACT nasal spray INSTILL TWO SPRAYS IN EACH NOSTRIL DAILY AS NEEDED FOR ALLERGIES 48 g 1  . hydrocortisone cream 1 % Apply 1 application topically daily as needed for itching.    . lansoprazole (PREVACID) 30 MG capsule Take 1 capsule (30 mg total) by mouth 2 (two) times daily before a meal. 180 capsule 3  . loperamide (IMODIUM) 2 MG capsule Take by mouth as needed for diarrhea or loose stools. Patient takes every 2-3 days.    . Multiple Vitamin (ONE DAILY) tablet Take by mouth.    . Simethicone (GAS-X PO) Take 1 tablet by mouth daily as needed.     . tamsulosin (FLOMAX) 0.4 MG CAPS capsule TAKE TWO CAPSULES BY MOUTH DAILY 180 capsule 3  . valACYclovir (VALTREX) 1000 MG tablet Take 1 tablet (1,000 mg total) by mouth 3 (three) times daily. 30 tablet 0  . vitamin C (ASCORBIC ACID) 500 MG tablet Take 500 mg by mouth daily.     No current facility-administered medications on file  prior to visit.    BP 100/62 (BP Location: Left Arm, Patient Position: Sitting, Cuff Size: Large)   Pulse (!) 57   Temp 97.9 F (36.6 C) (Oral)   Ht $R'5\' 8"'ts$  (1.727 m)   Wt 203 lb 3.2 oz (92.2 kg)   SpO2 96%   BMI 30.90 kg/m       Objective:   Physical Exam Vitals and nursing note reviewed.  Constitutional:      General: He is not in acute distress.    Appearance: Normal appearance. He is well-developed. He is obese.  HENT:     Head: Normocephalic and atraumatic.     Right Ear: Tympanic membrane, ear canal and external ear normal. There is no impacted cerumen.     Left Ear: Tympanic membrane, ear canal and external ear normal. There is no impacted cerumen.     Nose: Nose normal. No congestion or rhinorrhea.     Mouth/Throat:     Mouth: Mucous membranes are moist.     Pharynx: Oropharynx is clear. No oropharyngeal exudate or posterior oropharyngeal erythema.  Eyes:     General:        Right eye: No discharge.        Left eye: No discharge.     Extraocular Movements: Extraocular movements intact.     Conjunctiva/sclera: Conjunctivae normal.     Pupils: Pupils are equal, round, and reactive to light.  Neck:     Vascular: No carotid bruit.     Trachea: No tracheal deviation.  Cardiovascular:     Rate and  Rhythm: Normal rate and regular rhythm.     Pulses: Normal pulses.     Heart sounds: Normal heart sounds. No murmur heard.  No friction rub. No gallop.   Pulmonary:     Effort: Pulmonary effort is normal. No respiratory distress.     Breath sounds: Normal breath sounds. No stridor. No wheezing, rhonchi or rales.  Chest:     Chest wall: No tenderness.  Abdominal:     General: Bowel sounds are normal. There is no distension.     Palpations: Abdomen is soft. There is no mass.     Tenderness: There is no abdominal tenderness. There is no right CVA tenderness, left CVA tenderness, guarding or rebound.     Hernia: No hernia is present.  Musculoskeletal:        General: No  swelling, tenderness, deformity or signs of injury. Normal range of motion.     Right lower leg: No edema.     Left lower leg: No edema.  Lymphadenopathy:     Cervical: No cervical adenopathy.  Skin:    General: Skin is warm and dry.     Capillary Refill: Capillary refill takes less than 2 seconds.     Coloration: Skin is not jaundiced or pale.     Findings: No bruising, erythema, lesion or rash.  Neurological:     General: No focal deficit present.     Mental Status: He is alert and oriented to person, place, and time.     Cranial Nerves: No cranial nerve deficit.     Sensory: No sensory deficit.     Motor: No weakness.     Coordination: Coordination normal.     Gait: Gait normal.     Deep Tendon Reflexes: Reflexes normal.  Psychiatric:        Mood and Affect: Mood normal.        Behavior: Behavior normal.        Thought Content: Thought content normal.        Judgment: Judgment normal.        Assessment & Plan:  1. Essential hypertension - BO well controlled.  Encouraged to continue with lifestyle modifications to help with weight loss.  Follow-up in 1 year or sooner if needed - amLODipine (NORVASC) 5 MG tablet; TAKE 1 TABLET BY MOUTH EVERY DAY  Dispense: 90 tablet; Refill: 3 - CBC with Differential/Platelet; Future - Lipid panel; Future - TSH; Future - CMP with eGFR([facility]); Future - CMP with eGFR([facility]) - TSH - Lipid panel - CBC with Differential/Platelet  2. Benign prostatic hyperplasia with urinary hesitancy - Continue current medications  - PSA; Future - PSA  3. Mixed hyperlipidemia - Consider statin  - CBC with Differential/Platelet; Future - Lipid panel; Future - TSH; Future - CMP with eGFR([facility]); Future - CMP with eGFR([facility]) - TSH - Lipid panel - CBC with Differential/Platelet  4. Gastroesophageal reflux disease without esophagitis - Continue with current plan by GI   5. Abdominal aortic aneurysm (AAA) without rupture ([place]) - Continue yearly  monitoring  - amLODipine (NORVASC) 5 MG tablet; TAKE 1 TABLET BY MOUTH EVERY DAY  Dispense: 90 tablet; Refill: 3  Dorothyann Peng, NP

## 2020-06-09 LAB — LIPID PANEL
Cholesterol: 141 mg/dL (ref <200)
HDL: 39 mg/dL — ABNORMAL LOW (ref >=40)
LDL Cholesterol (Calc): 82 mg/dL (calc)
Non-HDL Cholesterol (Calc): 102 mg/dL (calc) (ref <130)
Total CHOL/HDL Ratio: 3.6 (calc) (ref <5.0)
Triglycerides: 106 mg/dL (ref <150)

## 2020-06-09 LAB — CBC WITH DIFFERENTIAL/PLATELET
Absolute Monocytes: 409 cells/uL (ref 200–950)
Basophils Absolute: 30 cells/uL (ref 0–200)
Basophils Relative: 0.7 %
Eosinophils Absolute: 142 cells/uL (ref 15–500)
Eosinophils Relative: 3.3 %
HCT: 41.6 % (ref 38.5–50.0)
Hemoglobin: 14.1 g/dL (ref 13.2–17.1)
Lymphs Abs: 808 cells/uL — ABNORMAL LOW (ref 850–3900)
MCH: 32.6 pg (ref 27.0–33.0)
MCHC: 33.9 g/dL (ref 32.0–36.0)
MCV: 96.1 fL (ref 80.0–100.0)
MPV: 9.9 fL (ref 7.5–12.5)
Monocytes Relative: 9.5 %
Neutro Abs: 2911 cells/uL (ref 1500–7800)
Neutrophils Relative %: 67.7 %
Platelets: 201 10*3/uL (ref 140–400)
RBC: 4.33 10*6/uL (ref 4.20–5.80)
RDW: 12 % (ref 11.0–15.0)
Total Lymphocyte: 18.8 %
WBC: 4.3 10*3/uL (ref 3.8–10.8)

## 2020-06-09 LAB — COMPLETE METABOLIC PANEL WITH GFR
AG Ratio: 2.3 (calc) (ref 1.0–2.5)
ALT: 14 U/L (ref 9–46)
AST: 50 U/L — ABNORMAL HIGH (ref 10–35)
Albumin: 4.2 g/dL (ref 3.6–5.1)
Alkaline phosphatase (APISO): 85 U/L (ref 35–144)
BUN: 16 mg/dL (ref 7–25)
CO2: 26 mmol/L (ref 20–32)
Calcium: 9.1 mg/dL (ref 8.6–10.3)
Chloride: 101 mmol/L (ref 98–110)
Creat: 1 mg/dL (ref 0.70–1.18)
GFR, Est African American: 86 mL/min/{1.73_m2} (ref >=60)
GFR, Est Non African American: 74 mL/min/{1.73_m2} (ref >=60)
Globulin: 1.8 g/dL (calc) — ABNORMAL LOW (ref 1.9–3.7)
Glucose, Bld: 96 mg/dL (ref 65–99)
Potassium: 4.3 mmol/L (ref 3.5–5.3)
Sodium: 138 mmol/L (ref 135–146)
Total Bilirubin: 1.6 mg/dL — ABNORMAL HIGH (ref 0.2–1.2)
Total Protein: 6 g/dL — ABNORMAL LOW (ref 6.1–8.1)

## 2020-06-09 LAB — TSH: TSH: 1.33 mIU/L (ref 0.40–4.50)

## 2020-06-09 LAB — PSA: PSA: 0.87 ng/mL (ref <=4.0)

## 2020-08-25 ENCOUNTER — Other Ambulatory Visit: Payer: Self-pay

## 2020-08-25 ENCOUNTER — Ambulatory Visit (INDEPENDENT_AMBULATORY_CARE_PROVIDER_SITE_OTHER): Payer: Medicare Other

## 2020-08-25 DIAGNOSIS — Z0000 Encounter for general adult medical examination without abnormal findings: Secondary | ICD-10-CM | POA: Diagnosis not present

## 2020-08-25 NOTE — Progress Notes (Signed)
Virtual Visit via Telephone Note  I connected with  Daniel Guerrero on 08/25/20 at  2:30 PM EST by telephone and verified that I am speaking with the correct person using two identifiers.  Location: Patient: Home Provider: office Persons participating in the virtual visit: patient/Nurse Health Advisor   I discussed the limitations, risks, security and privacy concerns of performing an evaluation and management service by telephone and the availability of in person appointments. The patient expressed understanding and agreed to proceed.  Interactive audio and video telecommunications were attempted between this nurse and patient, however failed, due to patient having technical difficulties OR patient did not have access to video capability.  We continued and completed visit with audio only.  Some vital signs may be absent or patient reported.   Marzella Schlein, LPN    Subjective:   Daniel Guerrero is a 75 y.o. male who presents for Medicare Annual/Subsequent preventive examination.  Review of Systems     Cardiac Risk Factors include: advanced age (>12men, >91 women);dyslipidemia;hypertension;male gender;obesity (BMI >30kg/m2)     Objective:    There were no vitals filed for this visit. There is no height or weight on file to calculate BMI.  Advanced Directives 08/25/2020 08/15/2019 12/26/2018 08/19/2018 08/07/2018 04/27/2018 07/27/2017  Does Patient Have a Medical Advance Directive? Yes Yes No;Yes Yes Yes Yes Yes  Type of Estate agent of [employer] Power of [place];Living will Healthcare Power of [employer] of [place];Living will Healthcare Power of [place];Living will Healthcare Power of [place];Living will -  Does patient want to make changes to medical advance directive? - No - Patient declined - No - Patient declined No - Patient declined No - Patient declined -  Copy of Healthcare Power of Attorney in Chart? Yes - validated most recent  copy scanned in chart (See row information) No - copy requested - - No - copy requested No - copy requested -  Would patient like information on creating a medical advance directive? - - - - - No - Patient declined -    Current Medications (verified) Outpatient Encounter Medications as of 08/25/2020  Medication Sig  . amLODipine (NORVASC) 5 MG tablet TAKE 1 TABLET BY MOUTH EVERY DAY  . cetirizine (ZYRTEC) 10 MG tablet Take 10 mg by mouth daily as needed for allergies.   Marland Kitchen CRANBERRY POWDER PO Take by mouth. 15,000 UNITS DAILY  . Cyanocobalamin (VITAMIN B-12 PO) Take 1 tablet by mouth daily.   Marland Kitchen desonide (DESOWEN) 0.05 % cream Apply 1 application topically daily as needed (rash).   . diphenhydrAMINE HCl (BENADRYL ALLERGY PO) Take 1 tablet by mouth daily.  Marland Kitchen dutasteride (AVODART) 0.5 MG capsule TAKE ONE CAPSULE BY MOUTH DAILY  . EPINEPHrine 0.3 mg/0.3 mL IJ SOAJ injection Inject 0.3 mLs (0.3 mg total) into the muscle as needed for anaphylaxis.  . fluticasone (FLONASE) 50 MCG/ACT nasal spray INSTILL TWO SPRAYS IN EACH NOSTRIL DAILY AS NEEDED FOR ALLERGIES  . hydrocortisone cream 1 % Apply 1 application topically daily as needed for itching.  . lansoprazole (PREVACID) 30 MG capsule Take 1 capsule (30 mg total) by mouth 2 (two) times daily before a meal.  . loperamide (IMODIUM) 2 MG capsule Take by mouth as needed for diarrhea or loose stools. Patient takes every 2-3 days.  . Multiple Vitamin (ONE DAILY) tablet Take by mouth.  . Simethicone (GAS-X PO) Take 1 tablet by mouth daily as needed.   . tamsulosin (FLOMAX) 0.4 MG CAPS capsule  TAKE TWO CAPSULES BY MOUTH DAILY  . vitamin C (ASCORBIC ACID) 500 MG tablet Take 500 mg by mouth daily.  . valACYclovir (VALTREX) 1000 MG tablet Take 1 tablet (1,000 mg total) by mouth 3 (three) times daily. (Patient not taking: Reported on 08/25/2020)  . [DISCONTINUED] aspirin 325 MG tablet Take 325 mg by mouth every 4 (four) hours as needed for mild pain.  (Patient not  taking: Reported on 08/25/2020)   No facility-administered encounter medications on file as of 08/25/2020.    Allergies (verified) Peanut-containing drug products, Penicillins, and Quinolones   History: Past Medical History:  Diagnosis Date  . Allergy   . BPH (benign prostatic hyperplasia)   . Cataract of both eyes    implants both eyes   . Eczema   . Enlarged aorta ([place])   . Erectile dysfunction   . Hearing loss    high frequency  . History of inguinal hernia repair, bilateral   . Hypertension   . Kidney stones   . Loss of hearing    high frequency  . Meniere disease   . Paroxysmal SVT (supraventricular tachycardia) ([place])   . Psoriasis   . S/P appendectomy   . S/P tonsillectomy   . Seasonal allergies   . SVT (supraventricular tachycardia) ([facility])    Past Surgical History:  Procedure Laterality Date  . Ablasion    . APPENDECTOMY    . CARDIAC ELECTROPHYSIOLOGY [place] AND ABLATION  2015  . CHOLECYSTECTOMY    . COLONOSCOPY     unsure where or when- states greater than 10 yrs ago per pt- was normal per pt and his wife   . COLONOSCOPY WITH PROPOFOL N/A 08/26/2018   Procedure: COLONOSCOPY WITH PROPOFOL;  Surgeon: [name], MD;  [facility];  Service: Endoscopy;  Laterality: N/A;  10:00am  . CYSTOSCOPY WITH RETROGRADE PYELOGRAM, URETEROSCOPY AND STENT PLACEMENT Left 12/30/2014   Procedure: CYSTOSCOPY WITH LEFT URETEROSCOPY STONE EXTRACTION WITH STENT;  Surgeon: Franchot Gallo, MD;  Location: [facility];  Service: Urology;  Laterality: Left;  . ELECTROPHYSIOLOGIC STUDY N/A 08/24/2015   Procedure: SVT Ablation;  Surgeon: Thompson Grayer, MD;  Location: [facility];  Service: Cardiovascular;  Laterality: N/A;  . ENDOVENOUS ABLATION SAPHENOUS VEIN W/ LASER Right 12/04/2016   endovenous laser ablation right greater saphenous vein by Tinnie Gens MD   . ESOPHAGOGASTRODUODENOSCOPY N/A 08/18/2016   Procedure: ESOPHAGOGASTRODUODENOSCOPY (EGD);  Surgeon: [name],  MD;  [facility];  Service: Endoscopy;  Laterality: N/A;  . ESOPHAGOGASTRODUODENOSCOPY N/A 09/06/2016   Procedure: ESOPHAGOGASTRODUODENOSCOPY (EGD);  Surgeon: [name], MD;  [facility];  Service: Endoscopy;  Laterality: N/A;  100 - moved to 2/14 @ 2:15 per Tretha Sciara  . ESOPHAGOGASTRODUODENOSCOPY (EGD) WITH PROPOFOL N/A 08/26/2018   Procedure: ESOPHAGOGASTRODUODENOSCOPY (EGD) WITH PROPOFOL;  Surgeon: [name], MD;  [facility];  Service: Endoscopy;  Laterality: N/A;  . EYE SURGERY     Catracts removed both eye 30 yrs ago  . HERNIA REPAIR     right and left  . HOLMIUM LASER APPLICATION Left 11/28/8500   Procedure: HOLMIUM LASER APPLICATION;  Surgeon: Franchot Gallo, MD;  [facility];  Service: Urology;  Laterality: Left;  Marland Kitchen MALONEY DILATION N/A 09/06/2016   Procedure: Venia Minks DILATION;  Surgeon: [name], MD;  [facility];  Service: Endoscopy;  Laterality: N/A;  Venia Minks DILATION N/A 08/26/2018   Procedure: Venia Minks DILATION;  Surgeon: [name], MD;  [facility];  Service: Endoscopy;  Laterality: N/A;  . POLYPECTOMY  08/26/2018   Procedure: POLYPECTOMY;  Surgeon: Corbin Ade, MD;  [facility];  Service: Endoscopy;;  polyp at descending colon x2  . TONSILLECTOMY     Family History  Problem Relation Age of Onset  . Cancer Mother        lung  . Thyroid disease Mother   . Diabetes Father   . GI Bleed Father   . Aneurysm Father   . AAA (abdominal aortic aneurysm) Father   . AAA (abdominal aortic aneurysm) Brother   . Colon cancer Neg Hx   . Colon polyps Neg Hx   . Rectal cancer Neg Hx   . Stomach cancer Neg Hx    Social History   Socioeconomic History  . Marital status: Married    Spouse name: Not on file  . Number of children: 3  . Years of education: 16  . Highest education level: Bachelor's degree (e.g., BA, AB, BS)  Occupational History  . Occupation: professor    Comment:  retired  Tobacco Use  . Smoking status: Former Smoker    Quit date: 07/24/1964    Years since quitting: 56.1  . Smokeless tobacco: Never Used  . Tobacco comment: Quit 20-30 years ago; smoked pipe and cigars   Vaping Use  . Vaping Use: Never used  Substance and Sexual Activity  . Alcohol use: Yes    Alcohol/week: 2.0 standard drinks    Types: 2 Cans of beer per week    Comment: 2 per day  . Drug use: No  . Sexual activity: Not on file  Other Topics Concern  . Not on file  Social History Narrative   Daily Caffeine use: 5-6 drinks daily      08/07/2018: No drug use, some daily alcohol use at night but patient does not feel like it disrupts his health or is a safety concern.       Lives with wife in two story home; three sons, five grandchildren in [place] area.   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             Social Determinants of Health   Financial Resource Strain: Low Risk   . Difficulty of Paying Living Expenses: Not hard at all  Food Insecurity: No Food Insecurity  . Worried About Programme researcher, broadcasting/film/video in the Last Year: Never true  . Ran Out of Food in the Last Year: Never true  Transportation Needs: No Transportation Needs  . Lack of Transportation (Medical): No  . Lack of Transportation (Non-Medical): No  Physical Activity: Insufficiently Active  . Days of Exercise per Week: 4 days  . Minutes of Exercise per Session: 30 min  Stress: No Stress Concern Present  . Feeling of Stress : Only a little  Social Connections: Moderately Isolated  . Frequency of Communication with Friends and Family: More than three times a week  . Frequency of Social Gatherings with Friends and Family: Once a week  . Attends Religious Services: Never  . Active Member of Clubs or Organizations: No  . Attends Banker Meetings: Never  . Marital Status: Married    Tobacco Counseling Counseling given: Not  Answered Comment: Quit 20-30 years ago; smoked pipe and cigars    Clinical Intake:  Pre-visit preparation completed: Yes  Pain : No/denies pain     BMI -  recorded: 30.9 Nutritional Status: BMI > 30  Obese Nutritional Risks: Nausea/ vomitting/ diarrhea (loose stool comes and go weekly) Diabetes: No  How often do you need to have someone help you when you read instructions, pamphlets, or other written materials from your doctor or pharmacy?: 1 - Never  Diabetic?no  Interpreter Needed?: No  Information entered by :: Charlott Rakes, LPN   Activities of Daily Living In your present state of health, do you have any difficulty performing the following activities: 08/25/2020  Hearing? Y  Comment wears hearing aids  Vision? N  Difficulty concentrating or making decisions? N  Walking or climbing stairs? N  Dressing or bathing? N  Doing errands, shopping? N  Preparing Food and eating ? N  Using the Toilet? N  In the past six months, have you accidently leaked urine? Y  Comment urgency  Do you have problems with loss of bowel control? Y  Comment loose stool since gall bladder surgery  Managing your Medications? N  Managing your Finances? N  Housekeeping or managing your Housekeeping? N  Some recent data might be hidden    Patient Care Team: Dorothyann Peng, NP as PCP - General (Family Medicine) Herminio Commons, MD (Inactive) as PCP - Cardiology (Cardiology) Grace Isaac, MD as Consulting Physician (Cardiothoracic Surgery) Thompson Grayer, MD as Consulting Physician (Cardiology) Herminio Commons, MD (Inactive) as Attending Physician (Cardiology) Susa Day, MD as Consulting Physician (Orthopedic Surgery) Lucas Mallow, MD as Consulting Physician (Urology) Jola Schmidt, MD as Consulting Physician (Ophthalmology) Gala Romney Cristopher Estimable, MD as Consulting Physician (Gastroenterology)  Indicate any recent Medical Services you may have received from other than  [facility] providers in the past year (date may be approximate).     Assessment:   This is a routine wellness examination for Daniel Guerrero.  Hearing/Vision screen  Hearing Screening   125Hz  250Hz  500Hz  1000Hz  2000Hz  3000Hz  4000Hz  6000Hz  8000Hz   Right ear:           Left ear:           Comments: Pt has hearing loss in left ear and detoriating in the right and wears hearing aids   Vision Screening Comments: Pt follows up with in [place] for annual eye exams   Dietary issues and exercise activities discussed: Current Exercise Habits: Home exercise routine, Type of exercise: walking;Other - see comments (stationary bike and gardening), Time (Minutes): 30, Frequency (Times/Week): 4, Weekly Exercise (Minutes/Week): 120  Goals    . Patient Stated     To continue walking this year Walks outdoors Baseline; try to increase up to 30 minutes 5 days a week      . Patient Stated     Keep doing what I love-teach!     . Patient Stated     Stay healthy      Depression Screen PHQ 2/9 Scores 08/25/2020 06/08/2020 08/15/2019 08/07/2018 07/27/2017 10/19/2015 12/18/2013  PHQ - 2 Score 0 0 0 1 0 0 0  PHQ- 9 Score - - - 2 - - -    Fall Risk Fall Risk  08/25/2020 06/08/2020 08/15/2019 08/07/2018 07/27/2017  Falls in the past year? 0 0 0 0 Yes  Number falls in past yr: 0 0 0 - 1  Injury with Fall? 0 0 - - No  Comment - - - - had meinere's   Risk for fall due to : Impaired mobility;Impaired balance/gait;Impaired vision - Medication side effect - -  Follow up Falls prevention discussed - Falls evaluation  completed;Education provided;Falls prevention discussed Falls prevention discussed;Education provided Education provided    FALL RISK PREVENTION PERTAINING TO THE HOME:  Any stairs in or around the home? Yes  If so, are there any without handrails? No  Home free of loose throw rugs in walkways, pet beds, electrical cords, etc? Yes  Adequate lighting in your home to reduce risk of falls? Yes   ASSISTIVE DEVICES  UTILIZED TO PREVENT FALLS:  Life alert? No  Use of a cane, walker or w/c? No  Grab bars in the bathroom? No  Shower chair or bench in shower? No  Elevated toilet seat or a handicapped toilet? No   TIMED UP AND GO:  Was the test performed? No .     Cognitive Function: MMSE - Mini Mental State Exam 07/27/2017  Not completed: (No Data)     6CIT Screen 08/25/2020 08/15/2019  What Year? 0 points 0 points  What month? 0 points 0 points  What time? - 0 points  Count back from 20 0 points 0 points  Months in reverse 4 points 0 points  Repeat phrase 10 points 0 points  Total Score - 0    Immunizations Immunization History  Administered Date(s) Administered  . Hepatitis A 09/28/2017  . Influenza, High Dose Seasonal PF 04/14/2016  . Influenza,inj,Quad PF,6+ Mos 04/16/2014, 05/01/2018  . Influenza,inj,quad, With Preservative 05/22/2017  . Influenza-Unspecified 05/25/2015, 05/24/2017, 04/08/2019, 03/23/2020  . PFIZER(Purple Top)SARS-COV-2 Vaccination 09/07/2019, 09/30/2019, 03/20/2020  . Pneumococcal Conjugate-13 04/14/2016  . Pneumococcal Polysaccharide-23 04/23/2008, 12/18/2013, 05/01/2018  . Td 12/13/2007  . Tdap 09/28/2017  . Zoster Recombinat (Shingrix) 08/10/2017, 12/06/2017    TDAP status: Up to date  Flu Vaccine status: Up to date done 03/23/20  Pneumococcal vaccine status: Up to date  Covid-19 vaccine status: Completed vaccines  Qualifies for Shingles Vaccine? Yes   Zostavax completed Yes   Shingrix Completed?: Yes  Screening Tests Health Maintenance  Topic Date Due  . COLONOSCOPY (Pts 45-34yrs Insurance coverage will need to be confirmed)  08/26/2021  . TETANUS/TDAP  09/29/2027  . INFLUENZA VACCINE  Completed  . COVID-19 Vaccine  Completed  . Hepatitis C Screening  Completed  . PNA vac Low Risk Adult  Completed  . FOOT EXAM  Discontinued  . HEMOGLOBIN A1C  Discontinued  . OPHTHALMOLOGY EXAM  Discontinued  . URINE MICROALBUMIN  Discontinued    Health  Maintenance  There are no preventive care reminders to display for this patient.  Colorectal cancer screening: Type of screening: Colonoscopy. Completed 08/26/18. Repeat every 3 years   Additional Screening:  Hepatitis C Screening:  Completed 02/20/19  Vision Screening: Recommended annual ophthalmology exams for early detection of glaucoma and other disorders of the eye. Is the patient up to date with their annual eye exam?  Yes  Who is the provider or what is the name of the office in which the patient attends annual eye exams? Unsure of name of [facility] provider   Dental Screening: Recommended annual dental exams for proper oral hygiene  Community Resource Referral / Chronic Care Management: CRR required this visit?  No   CCM required this visit?  No      Plan:     I have personally reviewed and noted the following in the patient's chart:   . Medical and social history . Use of alcohol, tobacco or illicit drugs  . Current medications and supplements . Functional ability and status . Nutritional status . Physical activity . Advanced directives . List of other physicians .  Hospitalizations, surgeries, and ER visits in previous 12 months . Vitals . Screenings to include cognitive, depression, and falls . Referrals and appointments  In addition, I have reviewed and discussed with patient certain preventive protocols, quality metrics, and best practice recommendations. A written personalized care plan for preventive services as well as general preventive health recommendations were provided to patient.     Willette Brace, LPN   0/03/3234   Nurse Notes: None

## 2020-08-25 NOTE — Patient Instructions (Addendum)
Daniel Guerrero , Thank you for taking time to come for your Medicare Wellness Visit. I appreciate your ongoing commitment to your health goals. Please review the following plan we discussed and let me know if I can assist you in the future.   Screening recommendations/referrals: Colonoscopy: Done 08/26/18 Recommended yearly ophthalmology/optometry visit for glaucoma screening and checkup Recommended yearly dental visit for hygiene and checkup  Vaccinations: Influenza vaccine: Done 03/23/20 Pneumococcal vaccine: Up to date Tdap vaccine: Up to date Shingles vaccine: Completed 08/10/17 & 12/06/17   Covid-19: Completed 2/14 & 09/30/19  Advanced directives: Copies in chart   Conditions/risks identified: Stay healthy  Next appointment: Follow up in one year for your annual wellness visit.   Preventive Care 75 Years and Older, Male Preventive care refers to lifestyle choices and visits with your health care provider that can promote health and wellness. What does preventive care include?  A yearly physical exam. This is also called an annual well check.  Dental exams once or twice a year.  Routine eye exams. Ask your health care provider how often you should have your eyes checked.  Personal lifestyle choices, including:  Daily care of your teeth and gums.  Regular physical activity.  Eating a healthy diet.  Avoiding tobacco and drug use.  Limiting alcohol use.  Practicing safe sex.  Taking low doses of aspirin every day.  Taking vitamin and mineral supplements as recommended by your health care provider. What happens during an annual well check? The services and screenings done by your health care provider during your annual well check will depend on your age, overall health, lifestyle risk factors, and family history of disease. Counseling  Your health care provider may ask you questions about your:  Alcohol use.  Tobacco use.  Drug use.  Emotional well-being.  Home and  relationship well-being.  Sexual activity.  Eating habits.  History of falls.  Memory and ability to understand (cognition).  Work and work Statistician. Screening  You may have the following tests or measurements:  Height, weight, and BMI.  Blood pressure.  Lipid and cholesterol levels. These may be checked every 5 years, or more frequently if you are over 45 years old.  Skin check.  Lung cancer screening. You may have this screening every year starting at age 38 if you have a 30-pack-year history of smoking and currently smoke or have quit within the past 15 years.  Fecal occult blood test (FOBT) of the stool. You may have this test every year starting at age 66.  Flexible sigmoidoscopy or colonoscopy. You may have a sigmoidoscopy every 5 years or a colonoscopy every 10 years starting at age 7.  Prostate cancer screening. Recommendations will vary depending on your family history and other risks.  Hepatitis C blood test.  Hepatitis B blood test.  Sexually transmitted disease (STD) testing.  Diabetes screening. This is done by checking your blood sugar (glucose) after you have not eaten for a while (fasting). You may have this done every 1-3 years.  Abdominal aortic aneurysm (AAA) screening. You may need this if you are a current or former smoker.  Osteoporosis. You may be screened starting at age 23 if you are at high risk. Talk with your health care provider about your test results, treatment options, and if necessary, the need for more tests. Vaccines  Your health care provider may recommend certain vaccines, such as:  Influenza vaccine. This is recommended every year.  Tetanus, diphtheria, and acellular pertussis (Tdap, Td)  vaccine. You may need a Td booster every 10 years.  Zoster vaccine. You may need this after age 42.  Pneumococcal 13-valent conjugate (PCV13) vaccine. One dose is recommended after age 4.  Pneumococcal polysaccharide (PPSV23) vaccine. One  dose is recommended after age 2. Talk to your health care provider about which screenings and vaccines you need and how often you need them. This information is not intended to replace advice given to you by your health care provider. Make sure you discuss any questions you have with your health care provider. Document Released: 08/06/2015 Document Revised: 03/29/2016 Document Reviewed: 05/11/2015 Elsevier Interactive Patient Education  2017 [employer] Prevention in the Home Falls can cause injuries. They can happen to people of all ages. There are many things you can do to make your home safe and to help prevent falls. What can I do on the outside of my home?  Regularly fix the edges of walkways and driveways and fix any cracks.  Remove anything that might make you trip as you walk through a door, such as a raised step or threshold.  Trim any bushes or trees on the path to your home.  Use bright outdoor lighting.  Clear any walking paths of anything that might make someone trip, such as rocks or tools.  Regularly check to see if handrails are loose or broken. Make sure that both sides of any steps have handrails.  Any raised decks and porches should have guardrails on the edges.  Have any leaves, snow, or ice cleared regularly.  Use sand or salt on walking paths during winter.  Clean up any spills in your garage right away. This includes oil or grease spills. What can I do in the bathroom?  Use night lights.  Install grab bars by the toilet and in the tub and shower. Do not use towel bars as grab bars.  Use non-skid mats or decals in the tub or shower.  If you need to sit down in the shower, use a plastic, non-slip stool.  Keep the floor dry. Clean up any water that spills on the floor as soon as it happens.  Remove soap buildup in the tub or shower regularly.  Attach bath mats securely with double-sided non-slip rug tape.  Do not have throw rugs and other  things on the floor that can make you trip. What can I do in the bedroom?  Use night lights.  Make sure that you have a light by your bed that is easy to reach.  Do not use any sheets or blankets that are too big for your bed. They should not hang down onto the floor.  Have a firm chair that has side arms. You can use this for support while you get dressed.  Do not have throw rugs and other things on the floor that can make you trip. What can I do in the kitchen?  Clean up any spills right away.  Avoid walking on wet floors.  Keep items that you use a lot in easy-to-reach places.  If you need to reach something above you, use a strong step stool that has a grab bar.  Keep electrical cords out of the way.  Do not use floor polish or wax that makes floors slippery. If you must use wax, use non-skid floor wax.  Do not have throw rugs and other things on the floor that can make you trip. What can I do with my stairs?  Do not leave  any items on the stairs.  Make sure that there are handrails on both sides of the stairs and use them. Fix handrails that are broken or loose. Make sure that handrails are as long as the stairways.  Check any carpeting to make sure that it is firmly attached to the stairs. Fix any carpet that is loose or worn.  Avoid having throw rugs at the top or bottom of the stairs. If you do have throw rugs, attach them to the floor with carpet tape.  Make sure that you have a light switch at the top of the stairs and the bottom of the stairs. If you do not have them, ask someone to add them for you. What else can I do to help prevent falls?  Wear shoes that:  Do not have high heels.  Have rubber bottoms.  Are comfortable and fit you well.  Are closed at the toe. Do not wear sandals.  If you use a stepladder:  Make sure that it is fully opened. Do not climb a closed stepladder.  Make sure that both sides of the stepladder are locked into place.  Ask  someone to hold it for you, if possible.  Clearly mark and make sure that you can see:  Any grab bars or handrails.  First and last steps.  Where the edge of each step is.  Use tools that help you move around (mobility aids) if they are needed. These include:  Canes.  Walkers.  Scooters.  Crutches.  Turn on the lights when you go into a dark area. Replace any light bulbs as soon as they burn out.  Set up your furniture so you have a clear path. Avoid moving your furniture around.  If any of your floors are uneven, fix them.  If there are any pets around you, be aware of where they are.  Review your medicines with your doctor. Some medicines can make you feel dizzy. This can increase your chance of falling. Ask your doctor what other things that you can do to help prevent falls. This information is not intended to replace advice given to you by your health care provider. Make sure you discuss any questions you have with your health care provider. Document Released: 05/06/2009 Document Revised: 12/16/2015 Document Reviewed: 08/14/2014 Elsevier Interactive Patient Education  2017 [employer].

## 2020-09-20 ENCOUNTER — Other Ambulatory Visit: Payer: Self-pay | Admitting: Nurse Practitioner

## 2020-09-20 DIAGNOSIS — K219 Gastro-esophageal reflux disease without esophagitis: Secondary | ICD-10-CM

## 2020-09-21 NOTE — Telephone Encounter (Signed)
Patient hasn't been seen since 2020. Please let him know I am sending in a limited 3 month supply of Prevacid BID, but he will need an OV for additional refills.

## 2020-09-22 NOTE — Telephone Encounter (Signed)
Spoke with pt. Pt is aware that he needs a follow up to receive additional refills. Apt scheduled for 10/28/2020.

## 2020-10-26 DIAGNOSIS — Z23 Encounter for immunization: Secondary | ICD-10-CM | POA: Diagnosis not present

## 2020-10-28 ENCOUNTER — Ambulatory Visit: Payer: Medicare Other | Admitting: Nurse Practitioner

## 2020-11-18 DIAGNOSIS — S61012A Laceration without foreign body of left thumb without damage to nail, initial encounter: Secondary | ICD-10-CM | POA: Diagnosis not present

## 2020-11-18 DIAGNOSIS — Z23 Encounter for immunization: Secondary | ICD-10-CM | POA: Diagnosis not present

## 2020-12-01 ENCOUNTER — Encounter: Payer: Self-pay | Admitting: Gastroenterology

## 2020-12-01 ENCOUNTER — Ambulatory Visit (INDEPENDENT_AMBULATORY_CARE_PROVIDER_SITE_OTHER): Payer: Medicare Other | Admitting: Gastroenterology

## 2020-12-01 ENCOUNTER — Other Ambulatory Visit: Payer: Self-pay

## 2020-12-01 VITALS — BP 136/74 | HR 55 | Temp 97.7°F | Ht 70.0 in | Wt 192.0 lb

## 2020-12-01 DIAGNOSIS — K219 Gastro-esophageal reflux disease without esophagitis: Secondary | ICD-10-CM | POA: Diagnosis not present

## 2020-12-01 DIAGNOSIS — R197 Diarrhea, unspecified: Secondary | ICD-10-CM | POA: Diagnosis not present

## 2020-12-01 MED ORDER — LANSOPRAZOLE 30 MG PO CPDR: 30.0000 mg | DELAYED_RELEASE_CAPSULE | Freq: Every day | ORAL | 3 refills | Status: DC

## 2020-12-01 NOTE — Patient Instructions (Addendum)
1. Continue lansoprazole 30mg  ONCE daily before breakfast for acid reflux. 2. Try imodium 1/2 tablet every morning to control your loose stools/stool frequency. If you continue to have problems with waking up at night with gas or need to have a bowel movement, then I would suggest changing your imodium to Colestid (which is more specifically used for diarrhea related to having your gallbladder removed". 3. Watch your weight. If you get to 175 pounds, please increase your calories. If you have persistent weight loss let me know. 4. We will see you back in one year. Call sooner if needed.

## 2020-12-01 NOTE — Progress Notes (Signed)
Primary Care Physician: Dorothyann Peng, NP  Primary Gastroenterologist:  Garfield Cornea, MD   Chief Complaint  Patient presents with  . Gastroesophageal Reflux    Doing okay per pt. Still taking prevacid    HPI: Daniel Guerrero is a 75 y.o. male here for follow-up of GERD.  Last seen November 2020.  Patient states that he has lost about 25 pounds in the past 3-4 months. He contributes this to reducing his oral intake due to diarrhea. States he is eating about 1/2 of what he used to. Stools are loose but not watery. Sometimes gets up 2-3 times in the night due to gas pain. He typically has 2-3 stools during the day. Sometimes will have solid stool if he takes imodium. Takes imodium one every 2 days.If he takes two imodium at a time, he may not have a BM for two days. GasX helps with gas pain. He is limiting dairy. He states he has had diarrhea since getting gallbladder removed couple of years ago. No melena, brbpr. Reflux is well controlled on lansoprazole. No dysphagia.   Colonoscopy in February 2020: two tubular adenomas,  diverticulosis.  Repeat colonoscopy in February 2025 if overall health permits.  EGD in February 2020: Mild Schatzki ring status post dilation, small hiatal hernia.  CTA of abdominal aorta with iliofemoral runoff 10/2019: -Stable 2.5 cm infrarenal aorta without complicating features. -2.8 cm right and 2.3 cm popliteal artery aneurysms. -Colonic diverticulosis -Celiac, SMA, IMA patent  Current Outpatient Medications  Medication Sig Dispense Refill  . amLODipine (NORVASC) 5 MG tablet TAKE 1 TABLET BY MOUTH EVERY DAY 90 tablet 3  . cetirizine (ZYRTEC) 10 MG tablet Take 10 mg by mouth daily as needed for allergies.     Marland Kitchen CRANBERRY POWDER PO Take by mouth. 15,000 UNITS as needed    . Cyanocobalamin (VITAMIN B-12 PO) Take 1 tablet by mouth daily.     Marland Kitchen desonide (DESOWEN) 0.05 % cream Apply 1 application topically daily as needed (rash).     . diphenhydrAMINE HCl  (BENADRYL ALLERGY PO) Take 1 tablet by mouth daily.    Marland Kitchen dutasteride (AVODART) 0.5 MG capsule TAKE ONE CAPSULE BY MOUTH DAILY 90 capsule 3  . EPINEPHrine 0.3 mg/0.3 mL IJ SOAJ injection Inject 0.3 mLs (0.3 mg total) into the muscle as needed for anaphylaxis. 1 each 1  . fluticasone (FLONASE) 50 MCG/ACT nasal spray INSTILL TWO SPRAYS IN EACH NOSTRIL DAILY AS NEEDED FOR ALLERGIES 48 g 1  . hydrocortisone cream 1 % Apply 1 application topically daily as needed for itching.    . lansoprazole (PREVACID) 30 MG capsule TAKE ONE CAPSULE BY MOUTH TWICE DAILY BEFORE A MEAL 180 capsule 0  . loperamide (IMODIUM) 2 MG capsule Take by mouth as needed for diarrhea or loose stools. Patient takes every 2-3 days.    . Multiple Vitamin (ONE DAILY) tablet Take by mouth.    . Simethicone (GAS-X PO) Take 1 tablet by mouth daily as needed.     . tamsulosin (FLOMAX) 0.4 MG CAPS capsule TAKE TWO CAPSULES BY MOUTH DAILY 180 capsule 3  . vitamin C (ASCORBIC ACID) 500 MG tablet Take 500 mg by mouth daily.     No current facility-administered medications for this visit.    Allergies as of 12/01/2020 - Review Complete 12/01/2020  Allergen Reaction Noted  . Peanut-containing drug products Hives 12/31/2009  . Penicillins Hives 03/11/2008  . Quinolones  04/11/2018    ROS:  General: Negative for  anorexia, fever, chills, fatigue, weakness. See hpi ENT: Negative for hoarseness, difficulty swallowing , nasal congestion. CV: Negative for chest pain, angina, palpitations, dyspnea on exertion, peripheral edema.  Respiratory: Negative for dyspnea at rest, dyspnea on exertion, cough, sputum, wheezing.  GI: See history of present illness. GU:  Negative for dysuria, hematuria, urinary incontinence, urinary frequency, nocturnal urination.  Endo: Negative for unusual weight change.    Physical Examination:   BP 136/74   Pulse (!) 55   Temp 97.7 F (36.5 C)   Ht 5\' 10"  (1.778 m)   Wt 192 lb (87.1 kg)   BMI 27.55 kg/m    General: Well-nourished, well-developed in no acute distress.  Eyes: No icterus. Mouth: masked. Lungs: Clear to auscultation bilaterally.  Heart: Regular rate and rhythm, no murmurs rubs or gallops.  Abdomen: Bowel sounds are normal, nontender, nondistended, no hepatosplenomegaly or masses, no abdominal bruits or hernia , no rebound or guarding.   Extremities: No lower extremity edema. No clubbing or deformities. Neuro: Alert and oriented x 4   Skin: Warm and dry, no jaundice.   Psych: Alert and cooperative, normal mood and affect.  Labs:  Lab Results  Component Value Date   CREATININE 1.00 06/08/2020   BUN 16 06/08/2020   NA 138 06/08/2020   K 4.3 06/08/2020   CL 101 06/08/2020   CO2 26 06/08/2020    Lab Results  Component Value Date   WBC 4.3 06/08/2020   HGB 14.1 06/08/2020   HCT 41.6 06/08/2020   MCV 96.1 06/08/2020   PLT 201 06/08/2020   Lab Results  Component Value Date   TSH 1.33 06/08/2020     Imaging Studies: No results found.   Assessment/Plan:  Jerrye Bushy: doing well on lansoprazole. Recommend once daily dosing if tolerated. Reinforced antireflux measures.   Diarrhea: has been present for couple of years since his gallbladder was removed. Symptoms are not daily. He has reduced his diet trying to control diarrhea. He has lost 25 pounds, somewhat intentional under direction of his PCP. He would like to get down to 175 pounds. He will monitor weight, would encourage increase caloric intake as he approaches 175 pounds. If unintentional weight loss he should let us or PCP know. We discussed several options for diarrhea concern. He would like to try 1/2 tablet of imodium every morning to see if this will improve stool frequency/consistency. If his symptoms are not controlled, he will call and at that time try colestid. Otherwise we will see him back in one year.

## 2021-01-31 ENCOUNTER — Other Ambulatory Visit: Payer: Self-pay

## 2021-02-01 ENCOUNTER — Ambulatory Visit (INDEPENDENT_AMBULATORY_CARE_PROVIDER_SITE_OTHER): Payer: Medicare Other | Admitting: Adult Health

## 2021-02-01 ENCOUNTER — Encounter: Payer: Self-pay | Admitting: Adult Health

## 2021-02-01 VITALS — BP 148/82 | HR 68 | Temp 98.2°F | Ht 70.0 in | Wt 194.0 lb

## 2021-02-01 DIAGNOSIS — I10 Essential (primary) hypertension: Secondary | ICD-10-CM | POA: Diagnosis not present

## 2021-02-01 DIAGNOSIS — R4189 Other symptoms and signs involving cognitive functions and awareness: Secondary | ICD-10-CM

## 2021-02-01 DIAGNOSIS — E538 Deficiency of other specified B group vitamins: Secondary | ICD-10-CM

## 2021-02-01 LAB — CBC WITH DIFFERENTIAL/PLATELET
Basophils Absolute: 0 10*3/uL (ref 0.0–0.1)
Basophils Relative: 0.5 % (ref 0.0–3.0)
Eosinophils Absolute: 0.2 10*3/uL (ref 0.0–0.7)
Eosinophils Relative: 3.6 % (ref 0.0–5.0)
HCT: 40.5 % (ref 39.0–52.0)
Hemoglobin: 14.2 g/dL (ref 13.0–17.0)
Lymphocytes Relative: 19.9 % (ref 12.0–46.0)
Lymphs Abs: 1 10*3/uL (ref 0.7–4.0)
MCHC: 35 g/dL (ref 30.0–36.0)
MCV: 96.6 fl (ref 78.0–100.0)
Monocytes Absolute: 0.5 10*3/uL (ref 0.1–1.0)
Monocytes Relative: 9.4 % (ref 3.0–12.0)
Neutro Abs: 3.5 10*3/uL (ref 1.4–7.7)
Neutrophils Relative %: 66.6 % (ref 43.0–77.0)
Platelets: 180 10*3/uL (ref 150.0–400.0)
RBC: 4.19 Mil/uL — ABNORMAL LOW (ref 4.22–5.81)
RDW: 12.9 % (ref 11.5–15.5)
WBC: 5.3 10*3/uL (ref 4.0–10.5)

## 2021-02-01 LAB — URINALYSIS
Bilirubin Urine: NEGATIVE
Hgb urine dipstick: NEGATIVE
Ketones, ur: NEGATIVE
Leukocytes,Ua: NEGATIVE
Nitrite: NEGATIVE
Specific Gravity, Urine: 1.02 (ref 1.000–1.030)
Total Protein, Urine: NEGATIVE
Urine Glucose: NEGATIVE
Urobilinogen, UA: 0.2 (ref 0.0–1.0)
pH: 6.5 (ref 5.0–8.0)

## 2021-02-01 LAB — VITAMIN B12: Vitamin B-12: 539 pg/mL (ref 211–911)

## 2021-02-01 LAB — TSH: TSH: 1.42 u[IU]/mL (ref 0.35–5.50)

## 2021-02-01 NOTE — Progress Notes (Signed)
Subjective:    Patient ID: Daniel Guerrero, male    DOB: 14-Mar-1946, 75 y.o.   MRN: [id number]  HPI 75 year old male who  has a past medical history of Allergy, BPH (benign prostatic hyperplasia), Cataract of both eyes, Eczema, Enlarged aorta (HCC), Erectile dysfunction, Hearing loss, History of inguinal hernia repair, bilateral, Hypertension, Kidney stones, Loss of hearing, Meniere disease, Paroxysmal SVT (supraventricular tachycardia) ([place]), Psoriasis, S/P appendectomy, S/P tonsillectomy, Seasonal allergies, and SVT (supraventricular tachycardia) ([place]).  He is being seen today with his son for cognitive impairment   Symptoms have been ongoing but feels as though they are getting worse.  Per his son "it seems like every 6 months symptoms get worse and worse".    Patient has noticed that while driving he is having more frequent moments where he cannot remember/figure out where he is going.  Sometimes it takes him upwards of 10 minutes to figure out his route to his destination.  This is driving around [place] in [place] where he is driven for many years.  He has not overtly gotten lost and has been able to find his way eventually.  He does know how to get to his son's house in [place] without much difficulty.  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  His son has noticed that he is having difficulty entering websites into the [employer] and also has started calling phone numbers that he sees in spam email.  He is misplacing things around the house more frequently.  He has no known family history of dementia but his parents died relatively young from various lung disease  He did have a MRI brain done in 01/2018 which showed  Mild cerebral volume loss and few FLAIR hyperintensities in the cerebral white matter, age congruent  Review of Systems See HPI   Past Medical History:  Diagnosis Date   Allergy    BPH (benign prostatic hyperplasia)    Cataract of both eyes    implants  both eyes    Eczema    Enlarged aorta (HCC)    Erectile dysfunction    Hearing loss    high frequency   History of inguinal hernia repair, bilateral    Hypertension    Kidney stones    Loss of hearing    high frequency   Meniere disease    Paroxysmal SVT (supraventricular tachycardia) (HCC)    Psoriasis    S/P appendectomy    S/P tonsillectomy    Seasonal allergies    SVT (supraventricular tachycardia) ([place])     Social History   Socioeconomic History   Marital status: Married    Spouse name: Not on file   Number of children: 3   Years of education: 16   Highest education level: Bachelor's degree (e.g., BA, AB, BS)  Occupational History   Occupation: professor    Comment: retired  Tobacco Use   Smoking status: Former    Pack years: 0.00    Types: Cigarettes    Quit date: 07/24/1964    Years since quitting: 56.5   Smokeless tobacco: Never   Tobacco comments:    Quit 20-30 years ago; smoked pipe and cigars   Vaping Use   Vaping Use: Never used  Substance and Sexual Activity   Alcohol use: Yes    Alcohol/week: 2.0 standard drinks    Types: 2 Cans of beer per week    Comment: 2 per day   Drug use: No   Sexual  activity: Not on file  Other Topics Concern   Not on file  Social History Narrative   Daily Caffeine use: 5-6 drinks daily      08/07/2018: No drug use, some daily alcohol use at night but patient does not feel like it disrupts his health or is a safety concern.       Lives with wife in two story home; three sons, five grandchildren in [place] area.   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             Social Determinants of Health   Financial Resource Strain: Low Risk    Difficulty of Paying Living Expenses: Not hard at all  Food Insecurity: No Food Insecurity   Worried About Charity fundraiser in the Last Year: Never true   [place] in the Last Year: Never true   Transportation Needs: No Transportation Needs   Lack of Transportation (Medical): No   Lack of Transportation (Non-Medical): No  Physical Activity: Insufficiently Active   Days of Exercise per Week: 4 days   Minutes of Exercise per Session: 30 min  Stress: No Stress Concern Present   Feeling of Stress : Only a little  Social Connections: Moderately Isolated   Frequency of Communication with Friends and Family: More than three times a week   Frequency of Social Gatherings with Friends and Family: Once a week   Attends Religious Services: Never   Marine scientist or Organizations: No   Attends Music therapist: Never   Marital Status: Married  Human resources officer Violence: Not At Risk   Fear of Current or Ex-Partner: No   Emotionally Abused: No   Physically Abused: No   Sexually Abused: No    Past Surgical History:  Procedure Laterality Date   Ablasion     APPENDECTOMY     CARDIAC ELECTROPHYSIOLOGY MAPPING AND ABLATION  2015   CHOLECYSTECTOMY     COLONOSCOPY     unsure where or when- states greater than 10 yrs ago per pt- was normal per pt and his wife    COLONOSCOPY WITH PROPOFOL N/A 08/26/2018   Procedure: COLONOSCOPY WITH PROPOFOL;  Surgeon: [name], MD;  [facility];  Service: Endoscopy;  Laterality: N/A;  10:00am   CYSTOSCOPY WITH RETROGRADE PYELOGRAM, URETEROSCOPY AND STENT PLACEMENT Left 12/30/2014   Procedure: CYSTOSCOPY WITH LEFT URETEROSCOPY STONE EXTRACTION WITH STENT;  Surgeon: Franchot Gallo, MD;  Location: [facility];  Service: Urology;  Laterality: Left;   ELECTROPHYSIOLOGIC STUDY N/A 08/24/2015   Procedure: SVT Ablation;  Surgeon: Thompson Grayer, MD;  Location: [facility];  Service: Cardiovascular;  Laterality: N/A;   ENDOVENOUS ABLATION SAPHENOUS VEIN W/ LASER Right 12/04/2016   endovenous laser ablation right greater saphenous vein by Tinnie Gens MD    ESOPHAGOGASTRODUODENOSCOPY N/A 08/18/2016   Procedure:  ESOPHAGOGASTRODUODENOSCOPY (EGD);  Surgeon: [name], MD;  [facility];  Service: Endoscopy;  Laterality: N/A;   ESOPHAGOGASTRODUODENOSCOPY N/A 09/06/2016   Procedure: ESOPHAGOGASTRODUODENOSCOPY (EGD);  Surgeon: [name], MD;  [facility];  Service: Endoscopy;  Laterality: N/A;  100 - moved to 2/14 @ 2:15 per Tretha Sciara   ESOPHAGOGASTRODUODENOSCOPY (EGD) WITH PROPOFOL N/A 08/26/2018   Procedure: ESOPHAGOGASTRODUODENOSCOPY (EGD) WITH PROPOFOL;  Surgeon: [name], MD;  [facility];  Service: Endoscopy;  Laterality: N/A;   EYE SURGERY     Catracts removed  both eye 30 yrs ago   [place]     right and left   HOLMIUM LASER APPLICATION Left 02/25/8849   Procedure: HOLMIUM LASER APPLICATION;  Surgeon: Franchot Gallo, MD;  [facility];  Service: Urology;  Laterality: Left;   MALONEY DILATION N/A 09/06/2016   Procedure: Venia Minks DILATION;  Surgeon: [name], MD;  [facility];  Service: Endoscopy;  Laterality: N/A;   MALONEY DILATION N/A 08/26/2018   Procedure: Venia Minks DILATION;  Surgeon: [name], MD;  [facility];  Service: Endoscopy;  Laterality: N/A;   POLYPECTOMY  08/26/2018   Procedure: POLYPECTOMY;  Surgeon: [name], MD;  [facility];  Service: Endoscopy;;  polyp at descending colon x2   TONSILLECTOMY      Family History  Problem Relation Age of Onset   Cancer Mother        lung   Thyroid disease Mother    Diabetes Father    GI Bleed Father    Aneurysm Father    AAA (abdominal aortic aneurysm) Father    AAA (abdominal aortic aneurysm) Brother    Colon cancer Neg Hx    Colon polyps Neg Hx    Rectal cancer Neg Hx    Stomach cancer Neg Hx     Allergies  Allergen Reactions   Peanut-Containing Drug Products Hives   Penicillins Hives    Has patient had a PCN reaction causing immediate rash, facial/tongue/throat swelling, SOB or lightheadedness with hypotension: No Has patient  had a PCN reaction causing severe rash involving mucus membranes or skin necrosis: No Has patient had a PCN reaction that required hospitalization No Has patient had a PCN reaction occurring within the last 10 years: No If all of the above answers are "NO", then may proceed with Cephalosporin use.   Quinolones     Patient was warned about not using Cipro and similar antibiotics. Recent studies have raised concern that fluoroquinolone antibiotics could be associated with an increased risk of aortic aneurysm Fluoroquinolones have non-antimicrobial properties that might jeopardise the integrity of the extracellular matrix of the vascular wall In a  propensity score matched cohort study in [place], there was a 66% increased rate of aortic aneurysm or dissection associated with oral fluoroquinolone use, compared wit    Current Outpatient Medications on File Prior to Visit  Medication Sig Dispense Refill   amLODipine (NORVASC) 5 MG tablet TAKE 1 TABLET BY MOUTH EVERY DAY 90 tablet 3   cetirizine (ZYRTEC) 10 MG tablet Take 10 mg by mouth daily as needed for allergies.      CRANBERRY POWDER PO Take by mouth. 15,000 UNITS as needed     Cyanocobalamin (VITAMIN B-12 PO) Take 1 tablet by mouth daily.      desonide (DESOWEN) 0.05 % cream Apply 1 application topically daily as needed (rash).      diphenhydrAMINE HCl (BENADRYL ALLERGY PO) Take 1 tablet by mouth daily.     dutasteride (AVODART) 0.5 MG capsule TAKE ONE CAPSULE BY MOUTH DAILY 90 capsule 3   EPINEPHrine 0.3 mg/0.3 mL IJ SOAJ injection Inject 0.3 mLs (0.3 mg total) into the muscle as needed for anaphylaxis. 1 each 1   fluticasone (FLONASE) 50 MCG/ACT nasal spray INSTILL TWO SPRAYS IN EACH NOSTRIL DAILY AS NEEDED FOR ALLERGIES 48 g 1   hydrocortisone cream 1 % Apply 1 application topically daily as needed for itching.     lansoprazole (PREVACID) 30 MG capsule Take 1 capsule (30  mg total) by mouth daily before breakfast. 90 capsule 3   loperamide  (IMODIUM) 2 MG capsule Take by mouth as needed for diarrhea or loose stools. Patient takes every 2-3 days.     Multiple Vitamin (ONE DAILY) tablet Take by mouth.     Simethicone (GAS-X PO) Take 1 tablet by mouth daily as needed.      tamsulosin (FLOMAX) 0.4 MG CAPS capsule TAKE TWO CAPSULES BY MOUTH DAILY 180 capsule 3   vitamin C (ASCORBIC ACID) 500 MG tablet Take 500 mg by mouth daily.     No current facility-administered medications on file prior to visit.    BP (!) 148/82   Pulse 68   Temp 98.2 F (36.8 C) (Oral)   Ht 5\' 10"  (1.778 m)   Wt 194 lb (88 kg)   SpO2 96%   BMI 27.84 kg/m       Objective:   Physical Exam Vitals and nursing note reviewed.  Constitutional:      Appearance: Normal appearance.  Cardiovascular:     Rate and Rhythm: Normal rate and regular rhythm.     Pulses: Normal pulses.     Heart sounds: Normal heart sounds.  Pulmonary:     Effort: Pulmonary effort is normal.     Breath sounds: Normal breath sounds.  Musculoskeletal:        General: Normal range of motion.  Skin:    General: Skin is warm and dry.     Capillary Refill: Capillary refill takes less than 2 seconds.  Neurological:     General: No focal deficit present.     Mental Status: He is alert and oriented to person, place, and time.  Psychiatric:        Mood and Affect: Mood normal.        Behavior: Behavior normal.        Thought Content: Thought content normal.        Judgment: Judgment normal.       Assessment & Plan:  1. Cognitive impairment MMSE - Mini Mental State Exam 02/01/2021 07/27/2017  Not completed: - (No Data)  Orientation to time 5 -  Orientation to Place 5 -  Registration 3 -  Attention/ Calculation 4 -  Recall 1 -  Language- name 2 objects 2 -  Language- repeat 1 -  Language- follow 3 step command 3 -  Language- read & follow direction 1 -  Write a sentence 1 -  Copy design 1 -  Total score 27 -  - Will check labs are order MRI brain. Consider referral to  Neuropsych testing  - TSH; Future - Vitamin B12; Future - Urinalysis; Future - CBC with Differential/Platelet; Future - MR Brain Wo Contrast; Future - CBC with Differential/Platelet - Urinalysis - Vitamin B12 - TSH  2. Deficiency of vitamin B12  - TSH; Future - Vitamin B12; Future - Urinalysis; Future - CBC with Differential/Platelet; Future - MR Brain Wo Contrast; Future - CBC with Differential/Platelet - Urinalysis - Vitamin B12 - TSH  Dorothyann Peng, NP

## 2021-02-01 NOTE — Patient Instructions (Signed)
It was great seeing you today   I am going to get some blood work on you today and order an MRI of your head to look for any changes from 2019.   Once I have the MRI and blood work back, I will refer you over for more formal testing

## 2021-02-02 ENCOUNTER — Telehealth: Payer: Self-pay | Admitting: Adult Health

## 2021-02-02 NOTE — Telephone Encounter (Signed)
Pt is calling the office back to get his lab results.

## 2021-02-02 NOTE — Telephone Encounter (Signed)
Patient notified of update  and verbalized understanding. 

## 2021-02-16 ENCOUNTER — Ambulatory Visit (HOSPITAL_COMMUNITY)
Admission: RE | Admit: 2021-02-16 | Discharge: 2021-02-16 | Disposition: A | Discharge status: Home / Self Care | Payer: Medicare Other | Source: Ambulatory Visit | Attending: Adult Health | Admitting: Adult Health

## 2021-02-16 ENCOUNTER — Other Ambulatory Visit: Payer: Self-pay

## 2021-02-16 DIAGNOSIS — E538 Deficiency of other specified B group vitamins: Secondary | ICD-10-CM | POA: Diagnosis not present

## 2021-02-16 DIAGNOSIS — G319 Degenerative disease of nervous system, unspecified: Secondary | ICD-10-CM | POA: Diagnosis not present

## 2021-02-16 DIAGNOSIS — R413 Other amnesia: Secondary | ICD-10-CM | POA: Diagnosis not present

## 2021-02-16 DIAGNOSIS — R4189 Other symptoms and signs involving cognitive functions and awareness: Secondary | ICD-10-CM | POA: Insufficient documentation

## 2021-02-17 ENCOUNTER — Other Ambulatory Visit: Payer: Self-pay | Admitting: Adult Health

## 2021-02-17 DIAGNOSIS — R4189 Other symptoms and signs involving cognitive functions and awareness: Secondary | ICD-10-CM

## 2021-02-17 NOTE — Progress Notes (Signed)
Referral for neuropsych testing

## 2021-02-21 ENCOUNTER — Encounter: Payer: Self-pay | Admitting: Physician Assistant

## 2021-02-23 ENCOUNTER — Other Ambulatory Visit: Payer: Self-pay | Admitting: *Deleted

## 2021-02-23 DIAGNOSIS — I712 Thoracic aortic aneurysm, without rupture, unspecified: Secondary | ICD-10-CM

## 2021-03-02 ENCOUNTER — Ambulatory Visit (INDEPENDENT_AMBULATORY_CARE_PROVIDER_SITE_OTHER): Payer: Medicare Other | Admitting: Physician Assistant

## 2021-03-02 ENCOUNTER — Encounter: Payer: Self-pay | Admitting: Physician Assistant

## 2021-03-02 ENCOUNTER — Other Ambulatory Visit: Payer: Self-pay

## 2021-03-02 VITALS — BP 143/80 | HR 75 | Resp 18 | Ht 70.0 in | Wt 196.0 lb

## 2021-03-02 DIAGNOSIS — F09 Unspecified mental disorder due to known physiological condition: Secondary | ICD-10-CM

## 2021-03-02 DIAGNOSIS — R413 Other amnesia: Secondary | ICD-10-CM

## 2021-03-02 MED ORDER — MEMANTINE HCL 10 MG PO TABS: ORAL_TABLET | ORAL | 11 refills | Status: DC

## 2021-03-02 NOTE — Patient Instructions (Addendum)
It was a pleasure to see you today at our office.   Recommendations:  Start Memantine  5 mg tablets.  Take 1 tablet at bedtime for 2 weeks, then 1 tablet twice daily.  Monitor for side effects  Call with any questions or concerns.  Follow up in 6 months  RECOMMENDATIONS FOR ALL PATIENTS WITH MEMORY PROBLEMS: 1. Continue to exercise (Recommend 30 minutes of walking everyday, or 3 hours every week) 2. Increase social interactions - continue going to [employer] and enjoy social gatherings with friends and family 3. Eat healthy, avoid fried foods and eat more fruits and vegetables 4. Maintain adequate blood pressure, blood sugar, and blood cholesterol level. Reducing the risk of stroke and cardiovascular disease also helps promoting better memory. 5. Avoid stressful situations. Live a simple life and avoid aggravations. Organize your time and prepare for the next day in anticipation. 6. Sleep well, avoid any interruptions of sleep and avoid any distractions in the bedroom that may interfere with adequate sleep quality 7. Avoid sugar, avoid sweets as there is a strong link between excessive sugar intake, diabetes, and cognitive impairment We discussed the Mediterranean diet, which has been shown to help patients reduce the risk of progressive memory disorders and reduces cardiovascular risk. This includes eating fish, eat fruits and green leafy vegetables, nuts like almonds and hazelnuts, walnuts, and also use olive oil. Avoid fast foods and fried foods as much as possible. Avoid sweets and sugar as sugar use has been linked to worsening of memory function.  There is always a concern of gradual progression of memory problems. If this is the case, then we may need to adjust level of care according to patient needs. Support, both to the patient and caregiver, should then be put into place.     FALL PRECAUTIONS: Be cautious when walking. Scan the area for obstacles that may increase the risk of trips and  falls. When getting up in the mornings, sit up at the edge of the bed for a few minutes before getting out of bed. Consider elevating the bed at the head end to avoid drop of blood pressure when getting up. Walk always in a well-lit room (use night lights in the walls). Avoid area rugs or power cords from appliances in the middle of the walkways. Use a walker or a cane if necessary and consider physical therapy for balance exercise. Get your eyesight checked regularly.  FINANCIAL OVERSIGHT: Supervision, especially oversight when making financial decisions or transactions is also recommended.  HOME SAFETY: Consider the safety of the kitchen when operating appliances like stoves, microwave oven, and blender. Consider having supervision and share cooking responsibilities until no longer able to participate in those. Accidents with firearms and other hazards in the house should be identified and addressed as well.   ABILITY TO BE LEFT ALONE: If patient is unable to contact 911 operator, consider using LifeLine, or when the need is there, arrange for someone to stay with patients. Smoking is a fire hazard, consider supervision or cessation. Risk of wandering should be assessed by caregiver and if detected at any point, supervision and safe proof recommendations should be instituted.  MEDICATION SUPERVISION: Inability to self-administer medication needs to be constantly addressed. Implement a mechanism to ensure safe administration of the medications.   DRIVING: Regarding driving, in patients with progressive memory problems, driving will be impaired. We advise to have someone else do the driving if trouble finding directions or if minor accidents are reported. Independent driving assessment  is available to determine safety of driving.   If you are interested in the driving assessment, you can contact the following:  [employer] in [address]  [place]  [place] [phone] or [phone]    [place] refers to food and lifestyle choices that are based on the traditions of countries located on the [employer]. This way of eating has been shown to help prevent certain conditions and improve outcomes for people who have chronic diseases, like kidney disease and heart disease. What are tips for following this plan? Lifestyle  Cook and eat meals together with your family, when possible. Drink enough fluid to keep your urine clear or pale yellow. Be physically active every day. This includes: Aerobic exercise like running or swimming. Leisure activities like gardening, walking, or housework. Get 7-8 hours of sleep each night. If recommended by your health care provider, drink red wine in moderation. This means 1 glass a day for nonpregnant women and 2 glasses a day for men. A glass of wine equals 5 oz (150 mL). Reading food labels  Check the serving size of packaged foods. For foods such as rice and pasta, the serving size refers to the amount of cooked product, not dry. Check the total fat in packaged foods. Avoid foods that have saturated fat or trans fats. Check the ingredients list for added sugars, such as corn syrup. Shopping  At the grocery store, buy most of your food from the areas near the walls of the store. This includes: Fresh fruits and vegetables (produce). Grains, beans, nuts, and seeds. Some of these may be available in unpackaged forms or large amounts (in bulk). Fresh seafood. Poultry and eggs. Low-fat dairy products. Buy whole ingredients instead of prepackaged foods. Buy fresh fruits and vegetables in-season from local farmers markets. Buy frozen fruits and vegetables in resealable bags. If you do not have access to quality fresh seafood, buy precooked frozen shrimp or canned fish, such as tuna, salmon, or sardines. Buy  small amounts of raw or cooked vegetables, salads, or olives from the deli or salad bar at your store. Stock your pantry so you always have certain foods on hand, such as olive oil, canned tuna, canned tomatoes, rice, pasta, and beans. Cooking  Cook foods with extra-virgin olive oil instead of using butter or other vegetable oils. Have meat as a side dish, and have vegetables or grains as your main dish. This means having meat in small portions or adding small amounts of meat to foods like pasta or stew. Use beans or vegetables instead of meat in common dishes like chili or lasagna. Experiment with different cooking methods. Try roasting or broiling vegetables instead of steaming or sauteing them. Add frozen vegetables to soups, stews, pasta, or rice. Add nuts or seeds for added healthy fat at each meal. You can add these to yogurt, salads, or vegetable dishes. Marinate fish or vegetables using olive oil, lemon juice, garlic, and fresh herbs. Meal planning  Plan to eat 1 vegetarian meal one day each week. Try to work up to 2 vegetarian meals, if possible. Eat seafood 2 or more times a week. Have healthy snacks readily available, such as: Vegetable sticks with hummus. Greek yogurt. Fruit and nut trail mix. Eat balanced meals throughout the week. This includes: Fruit: 2-3 servings a day Vegetables: 4-5 servings a day Low-fat dairy: 2 servings a day Fish, poultry, or lean meat: 1  serving a day Beans and legumes: 2 or more servings a week Nuts and seeds: 1-2 servings a day Whole grains: 6-8 servings a day Extra-virgin olive oil: 3-4 servings a day Limit red meat and sweets to only a few servings a month What are my food choices? Mediterranean diet Recommended Grains: Whole-grain pasta. Brown rice. Bulgar wheat. Polenta. Couscous. Whole-wheat bread. [place]. Vegetables: Artichokes. Beets. Broccoli. Cabbage. Carrots. Eggplant. Green beans. Chard. Kale. Spinach. Onions. Leeks. Peas.  Squash. Tomatoes. Peppers. Radishes. Fruits: Apples. Apricots. Avocado. Berries. Bananas. Cherries. Dates. Figs. Grapes. Lemons. Melon. Oranges. Peaches. Plums. Pomegranate. Meats and other protein foods: Beans. Almonds. Sunflower seeds. Pine nuts. Peanuts. [place]. Salmon. Scallops. Shrimp. [place]. Tilapia. Clams. Oysters. Eggs. Dairy: Low-fat milk. Cheese. Greek yogurt. Beverages: Water. Red wine. Herbal tea. Fats and oils: Extra virgin olive oil. Avocado oil. Grape seed oil. Sweets and desserts: [place] yogurt with honey. Baked apples. Poached pears. Trail mix. Seasoning and other foods: Basil. Cilantro. Coriander. Cumin. Mint. Parsley. Sage. Rosemary. Tarragon. Garlic. Oregano. Thyme. Pepper. Balsalmic vinegar. Tahini. Hummus. Tomato sauce. Olives. Mushrooms. Limit these Grains: Prepackaged pasta or rice dishes. Prepackaged cereal with added sugar. Vegetables: Deep fried potatoes (french fries). Fruits: Fruit canned in syrup. Meats and other protein foods: Beef. Pork. Lamb. Poultry with skin. Hot dogs. [place] Salines. Dairy: Ice cream. Sour cream. Whole milk. Beverages: Juice. Sugar-sweetened soft drinks. Beer. Liquor and spirits. Fats and oils: Butter. Canola oil. Vegetable oil. Beef fat (tallow). Lard. Sweets and desserts: Cookies. Cakes. Pies. Candy. Seasoning and other foods: Mayonnaise. Premade sauces and marinades. The items listed may not be a complete list. Talk with your dietitian about what dietary choices are right for you. Summary The Mediterranean diet includes both food and lifestyle choices. Eat a variety of fresh fruits and vegetables, beans, nuts, seeds, and whole grains. Limit the amount of red meat and sweets that you eat. Talk with your health care provider about whether it is safe for you to drink red wine in moderation. This means 1 glass a day for nonpregnant women and 2 glasses a day for men. A glass of wine equals 5 oz (150 mL). This information is not intended to replace advice  given to you by your health care provider. Make sure you discuss any questions you have with your health care provider. Document Released: 03/02/2016 Document Revised: 04/04/2016 Document Reviewed: 03/02/2016 Elsevier Interactive Patient Education  2017 [employer].

## 2021-03-02 NOTE — Progress Notes (Addendum)
Assessment/Plan:   Daniel Guerrero is a 75 y.o. year old male with a history of migraines, sinus bradycardia, chronic diarrhea from gallbladder disease, bilateral hearing loss, hypertension,  B12 deficiency,  seen today for evaluation of memory loss.  MoCA today is 20/30 with deficiencies in  memory, attention, language, and delayed recall 0 /5, orientation 4/6    Recommendations:   Mild Cognitive Disorder    Discussed safety both in and out of the home.  Discussed the importance of regular daily schedule with inclusion of crossword puzzles to maintain brain function.  Stay active at least 30 minutes at least 3 times a week.  Naps should be scheduled and should be no longer than 60 minutes and should not occur after 2 PM.  Mediterranean diet is recommended  Start Memantine 5 mg tablets. Take 1 tablet at bedtime for 2 weeks, then 1 tablet twice daily. Side effects were discussed  Folllow up once results above are available   Subjective:    The patient is seen in neurologic consultation at the request of Dorothyann Peng, NP for the evaluation of memory.  The patient is accompanied by wife Daniel Guerrero who supplements the history. 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  He states that recalling names and numbers became more difficult, even numbers that he had dialed frequently.  "I have been always bad with dates, so I was not too worried when that all I could not remember "    His mood has been good, without depression or irritability.  He likes to enjoy his retirement, working on his yard, walking with his wife, and using his stationary bicycle.  He still would like to go back to teaching if he could.  He falls asleep well at night, but because of frequent urination he wakes up about 3 times to go to the bathroom, which  interferes with his sleep.  He has begun taking Flomax which seems to help somehow.  He denies any vivid dreams or sleepwalking.  He denies hallucinations, paranoia, or leaving objects in unusual places.   He is independent of bathing and dressing.  Occasionally, he forgets to take a dose of medication.  He tries to set then in front of the breakfast table in a pillbox and his wife monitors closely.  She also manages the finances, she is an Optometrist, and has been doing so "forever ".  His appetite is good, but when he was having worse diarrhea, had lost a significant amount of weight, using this opportunity to  eat better and stay within the 180 to 190 pound range.  He denies trouble swallowing.  He never cooks, his wife always did the cooking.   He ambulates without difficulty without the use of a walker or a cane.  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  He drives and uses his GPS, without getting lost  -he depends so much of his GPS, but at times he may feel lost without it.  He denies any recent headaches -He has a history of migraines, but they are not life limiting with a frequency of about once a month.  He denies any falls,  head injuries, double vision, dizziness, vertigo, focal numbness or tingling, unilateral weakness or tremors, denies anosmia, never had COVID.  He denies a history of sleep apnea, alcohol or tobacco abuse.  Family history negative for dementia.    Labs  02/01/21   TSH nl 1.42 B12  nl 539 UA neg CBC normal     MRI brain 02/16/21  No evidence of acute intracranial abnormality.   Mild chronic small vessel ischemic changes within the cerebral white matter and pons, similar as compared to the brain MRI of 01/25/2018.   Redemonstrated scattered chronic microhemorrhages within the  frontoparietal lobes, nonspecific but likely reflecting sequela of hypertensive  microangiopathy.   Mild generalized cerebral and cerebellar atrophy.   Mild bilateral ethmoid and maxillary sinus mucosal thickening.  Allergies  Allergen Reactions   Peanut-Containing Drug Products Hives   Penicillins Hives    Has patient had a PCN reaction causing immediate rash, facial/tongue/throat swelling, SOB or lightheadedness with hypotension: No Has patient had a PCN reaction causing severe rash involving mucus membranes or skin necrosis: No Has patient had a PCN reaction that required hospitalization No Has patient had a PCN reaction occurring within the last 10 years: No If all of the above answers are "NO", then may proceed with Cephalosporin use.   Quinolones     Patient was warned about not using Cipro and similar antibiotics. Recent studies have raised concern that fluoroquinolone antibiotics could be associated with an increased risk of aortic aneurysm Fluoroquinolones have non-antimicrobial properties that might jeopardise the integrity of the extracellular matrix of the vascular wall In a  propensity score matched cohort study in [place], there was a 66% increased rate of aortic aneurysm or dissection associated with oral fluoroquinolone use, compared wit    Current Outpatient Medications  Medication Instructions   amLODipine (NORVASC) 5 MG tablet TAKE 1 TABLET BY MOUTH EVERY DAY   cetirizine (ZYRTEC) 10 mg, Oral, Daily PRN   CRANBERRY POWDER PO Take by mouth. 15,000 UNITS as needed   Cyanocobalamin (VITAMIN B-12 PO) 1 tablet, Oral, Daily   desonide (DESOWEN) [id number] % cream 1 application, Topical, Daily PRN   diphenhydrAMINE HCl (BENADRYL ALLERGY PO) 1 tablet, Oral, Daily   dutasteride (AVODART) 0.5 MG capsule TAKE ONE CAPSULE BY MOUTH DAILY   EPINEPHrine (EPI-PEN) 0.3 mg, Intramuscular, As needed   fluticasone (FLONASE) 50 MCG/ACT nasal spray INSTILL TWO SPRAYS IN EACH NOSTRIL DAILY AS NEEDED FOR ALLERGIES   hydrocortisone cream 1 % 1 application, Daily PRN    lansoprazole (PREVACID) 30 mg, Oral, Daily before breakfast   loperamide (IMODIUM) 2 MG capsule As needed   memantine (NAMENDA) 10 MG tablet Take 1 tablet (5 mg at night) for 2 weeks, then increase to 1 tablet (5 mg) twice a day   Multiple Vitamin (ONE DAILY) tablet Oral   Simethicone (GAS-X PO) 1 tablet, Oral, Daily PRN   tamsulosin (FLOMAX) 0.4 MG CAPS capsule TAKE TWO CAPSULES BY MOUTH DAILY   vitamin C (ASCORBIC ACID) 500 mg, Daily     VITALS:   Vitals:   03/02/21 1309  BP: (!) 143/80  Pulse: 75  Resp: 18  SpO2: 98%  Weight: 196 lb (88.9 kg)  Height: '5\' 10"'$  (1.778 m)    PHYSICAL EXAM   HEENT:  Normocephalic, atraumatic. The mucous membranes are moist. The superficial temporal arteries are without ropiness or tenderness. Cardiovascular: Regular rate and rhythm. Lungs: Clear to auscultation bilaterally. Neck: There are no carotid bruits noted bilaterally.  NEUROLOGICAL: [place] Cognitive  Assessment  03/02/2021  Visuospatial/ Executive (0/5) 4  Naming (0/3) 2  Attention: Read list of digits (0/2) 2  Attention: Read list of letters (0/1) 1  Attention: Serial 7 subtraction starting at 100 (0/3) 3  Language: Repeat phrase (0/2) 2  Language : Fluency (0/1) 1  Abstraction (0/2) 1  Delayed Recall (0/5) 0  Orientation (0/6) 4  Total 20  Adjusted Score (based on education) 20   MMSE - [place] Exam 02/01/2021 07/27/2017  Not completed: - (No Data)  Orientation to time 5 -  Orientation to Place 5 -  Registration 3 -  Attention/ Calculation 4 -  Recall 1 -  Language- name 2 objects 2 -  Language- repeat 1 -  Language- follow 3 step command 3 -  Language- read & follow direction 1 -  Write a sentence 1 -  Copy design 1 -  Total score 27 -    No flowsheet data found.   Orientation:  Alert and oriented to person, place, date is 03/01/20  No aphasia or dysarthria. Fund of knowledge is appropriate. Recent  memory is impaired and remote memory intact.  Attention and  concentration are normal.  Able to name objects and repeat phrases. Delayed recall  0/5     Cranial nerves: There is good facial symmetry. Extraocular muscles are intact and visual fields are full to confrontational testing. Speech is fluent and clear. Soft palate rises symmetrically and there is no tongue deviation. Hearing is intact to conversational tone. Tone: Tone is good throughout. Sensation: Sensation is intact to light touch and pinprick throughout. Vibration is intact at the bilateral big toe.There is no extinction with double simultaneous stimulation. There is no sensory dermatomal level identified. Coordination: The patient has no difficulty with RAM's or FNF bilaterally. Normal finger to nose  Motor: Strength is 5/5 in the bilateral upper and lower extremities. There is no pronator drift. There are no fasciculations noted. DTR's: Deep tendon reflexes are 2/4 at the bilateral biceps, triceps, brachioradialis, patella and achilles.  Plantar responses are downgoing bilaterally. Gait and Station: The patient is able to ambulate without difficulty.The patient is able to heel toe walk without any difficulty.The patient is able to ambulate in a tandem fashion. The patient is able to stand in the Romberg position.     Thank you for allowing [place] the opportunity to participate in the care of this nice patient. Please do not hesitate to contact us for any questions or concerns.   Total time spent on today's visit was  60 minutes, including both face-to-face time and nonface-to-face time.  Time included that spent on review of records (prior notes available to me/labs/imaging if pertinent), discussing treatment and goals, answering patient's questions and coordinating care.  Cc:  Dorothyann Peng, NP  Sharene Butters 03/02/2021 3:12 PM

## 2021-03-25 DIAGNOSIS — Z23 Encounter for immunization: Secondary | ICD-10-CM | POA: Diagnosis not present

## 2021-04-07 DIAGNOSIS — Z23 Encounter for immunization: Secondary | ICD-10-CM | POA: Diagnosis not present

## 2021-04-14 ENCOUNTER — Other Ambulatory Visit: Payer: Self-pay

## 2021-04-14 ENCOUNTER — Encounter: Payer: Self-pay | Admitting: Physician Assistant

## 2021-04-14 ENCOUNTER — Ambulatory Visit (INDEPENDENT_AMBULATORY_CARE_PROVIDER_SITE_OTHER): Payer: Medicare Other | Admitting: Physician Assistant

## 2021-04-14 ENCOUNTER — Ambulatory Visit
Admission: RE | Admit: 2021-04-14 | Discharge: 2021-04-14 | Disposition: A | Discharge status: Home / Self Care | Payer: Medicare Other | Source: Ambulatory Visit | Attending: Thoracic Surgery (Cardiothoracic Vascular Surgery) | Admitting: Thoracic Surgery (Cardiothoracic Vascular Surgery)

## 2021-04-14 VITALS — BP 135/66 | HR 67 | Resp 20 | Ht 70.0 in | Wt 196.0 lb

## 2021-04-14 DIAGNOSIS — I2510 Atherosclerotic heart disease of native coronary artery without angina pectoris: Secondary | ICD-10-CM | POA: Diagnosis not present

## 2021-04-14 DIAGNOSIS — I700 Atherosclerosis of aorta: Secondary | ICD-10-CM | POA: Diagnosis not present

## 2021-04-14 DIAGNOSIS — I77810 Thoracic aortic ectasia: Secondary | ICD-10-CM

## 2021-04-14 DIAGNOSIS — I712 Thoracic aortic aneurysm, without rupture, unspecified: Secondary | ICD-10-CM

## 2021-04-14 DIAGNOSIS — K7689 Other specified diseases of liver: Secondary | ICD-10-CM | POA: Diagnosis not present

## 2021-04-14 MED ORDER — IOPAMIDOL (ISOVUE-370) INJECTION 76%
75.0000 mL | Freq: Once | INTRAVENOUS | Status: AC | PRN
  Administered 2021-04-14: 75 mL via INTRAVENOUS

## 2021-04-14 NOTE — Progress Notes (Signed)
[address]       [place] [address]             [phone]                    Savio S Ilg [facility]bee Medical Record #[id number] Date of Birth: 20-Oct-1945  Referring: Thompson Grayer, MD Primary Care: Dorothyann Peng, NP  Chief Complaint:    Mildly dilated aortic root   History of Present Illness:    Daniel Guerrero 75 y.o. male is seen in the office  Today  for follow-up of mildly dilated aortic root found incidentally on an echocardiogram.   Patient notes that he's had episodes of fast heart rate since his teens.   Dr. Rayann Heman performed cardiac ablation 2017 .  His work-up prior to ablation an echocardiogram was performed. Echo 06/10/15 demonstrated EF 60-65%, mildly to moderately dilated aortic root, LA 40.   Because of the mildly dilated aortic root on echocardiogram a CTA of the chest was performed and the patient was referred to cardiac surgery 4 years ago  His echocardiogram demonstrates a trileaflet aortic valve.   He has no family history of aortic dissection or sudden death at a young age of unexplained cause. His father died from  ruptured abdominal aneurysm at age 30, and nonoperative treatment was decided upon.   He was recently seen by vascular surgery for follow-up of abdominal aortic aneurysm and bilateral popliteal artery aneurysms- duplex scan done 2019   Since we saw him last he has been doing well without any issues. His BP has been well controlled at home. He is able to walk some and use a recumbent bike. He enjoys doing yard work. He has tried to maintain a low-sodium diet.    Current Activity/ Functional Status:  Patient is independent with mobility/ambulation, transfers, ADL's, IADL's.   Zubrod Score: At the time of surgery this patient's most appropriate activity status/level should be described as: []     0    Normal activity, no symptoms [x]     1    Restricted in physical strenuous activity but ambulatory, able to do out  light work []     2    Ambulatory and capable of self care, unable to do work activities, up and about               >50 % of waking hours                              []     3    Only limited self care, in bed greater than 50% of waking hours []     4    Completely disabled, no self care, confined to bed or chair []     5    Moribund   Past Medical History:  Diagnosis Date   Allergy    BPH (benign prostatic hyperplasia)    Cataract of both eyes    implants both eyes    Eczema    Enlarged aorta (HCC)    Erectile dysfunction    Hearing loss    high frequency   History of inguinal hernia repair, bilateral    Hypertension    Kidney stones    Loss of hearing    high frequency   Meniere disease    Paroxysmal SVT (supraventricular tachycardia) (HCC)    Psoriasis    S/P appendectomy  S/P tonsillectomy    Seasonal allergies    SVT (supraventricular tachycardia) (HCC)     Past Surgical History:  Procedure Laterality Date   Ablasion     APPENDECTOMY     CARDIAC ELECTROPHYSIOLOGY MAPPING AND ABLATION  2015   CHOLECYSTECTOMY     COLONOSCOPY     unsure where or when- states greater than 10 yrs ago per pt- was normal per pt and his wife    COLONOSCOPY WITH PROPOFOL N/A 08/26/2018   Procedure: COLONOSCOPY WITH PROPOFOL;  Surgeon: [name], MD;  [facility];  Service: Endoscopy;  Laterality: N/A;  10:00am   CYSTOSCOPY WITH RETROGRADE PYELOGRAM, URETEROSCOPY AND STENT PLACEMENT Left 12/30/2014   Procedure: CYSTOSCOPY WITH LEFT URETEROSCOPY STONE EXTRACTION WITH STENT;  Surgeon: Franchot Gallo, MD;  Location: [facility];  Service: Urology;  Laterality: Left;   ELECTROPHYSIOLOGIC STUDY N/A 08/24/2015   Procedure: SVT Ablation;  Surgeon: Thompson Grayer, MD;  Location: [facility];  Service: Cardiovascular;  Laterality: N/A;   ENDOVENOUS ABLATION SAPHENOUS VEIN W/ LASER Right 12/04/2016   endovenous laser ablation right greater saphenous vein by Tinnie Gens MD     ESOPHAGOGASTRODUODENOSCOPY N/A 08/18/2016   Procedure: ESOPHAGOGASTRODUODENOSCOPY (EGD);  Surgeon: [name], MD;  [facility];  Service: Endoscopy;  Laterality: N/A;   ESOPHAGOGASTRODUODENOSCOPY N/A 09/06/2016   Procedure: ESOPHAGOGASTRODUODENOSCOPY (EGD);  Surgeon: [name], MD;  [facility];  Service: Endoscopy;  Laterality: N/A;  100 - moved to 2/14 @ 2:15 per Tretha Sciara   ESOPHAGOGASTRODUODENOSCOPY (EGD) WITH PROPOFOL N/A 08/26/2018   Procedure: ESOPHAGOGASTRODUODENOSCOPY (EGD) WITH PROPOFOL;  Surgeon: [name], MD;  [facility];  Service: Endoscopy;  Laterality: N/A;   EYE SURGERY     Catracts removed both eye 30 yrs ago   [place]     right and left   HOLMIUM LASER APPLICATION Left 0/09/5463   Procedure: HOLMIUM LASER APPLICATION;  Surgeon: Franchot Gallo, MD;  Location: [facility];  Service: Urology;  Laterality: Left;   MALONEY DILATION N/A 09/06/2016   Procedure: Venia Minks DILATION;  Surgeon: [name], MD;  [facility];  Service: Endoscopy;  Laterality: N/A;   MALONEY DILATION N/A 08/26/2018   Procedure: Venia Minks DILATION;  Surgeon: [name], MD;  [facility];  Service: Endoscopy;  Laterality: N/A;   POLYPECTOMY  08/26/2018   Procedure: POLYPECTOMY;  Surgeon: [name], MD;  [facility];  Service: Endoscopy;;  polyp at descending colon x2   TONSILLECTOMY      Family History  Problem Relation Age of Onset   Cancer Mother        lung   Thyroid disease Mother    Diabetes Father    GI Bleed Father    Aneurysm Father    AAA (abdominal aortic aneurysm) Father    Colon cancer Neg Hx    Colon polyps Neg Hx    Rectal cancer Neg Hx    Stomach cancer Neg Hx    Patient's mother died at age 10 of lung cancer he has one sister and one brother are healthy Social History   Social History   Marital Status: Married    Spouse Name: N/A   Number of Children: N/A   Years of Education: N/A    Occupational History    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[employer], [employer]    Social History Main Topics   Smoking status: Former Smoker  Smokeless tobacco: Never Used     Comment: Quit 20-30 years ago   Alcohol Use: 25.2 oz/week    7 Glasses of wine, 28 Cans of beer, 7 Shots of liquor per week     Comment: occasional   Drug Use: No   Sexual Activity: Not on file          Social History Narrative   Daily Caffeine use: 5-6 drinks daily   Exercising 2 times per week   No Drug use             Social History   Tobacco Use  Smoking Status Former Smoker   Quit date: 07/24/1964   Years since quitting: 55.2  Smokeless Tobacco Never Used  Tobacco Comment   Quit 20-30 years ago; smoked pipe and cigars     Social History   Substance and Sexual Activity  Alcohol Use Yes   Alcohol/week: 2.0 standard drinks   Types: 2 Cans of beer per week   Comment: 2 per day     Allergies  Allergen Reactions   Peanut-Containing Drug Products Hives   Penicillins Hives    Has patient had a PCN reaction causing immediate rash, facial/tongue/throat swelling, SOB or lightheadedness with hypotension: No Has patient had a PCN reaction causing severe rash involving mucus membranes or skin necrosis: No Has patient had a PCN reaction that required hospitalization No Has patient had a PCN reaction occurring within the last 10 years: No If all of the above answers are "NO", then may proceed with Cephalosporin use.   Quinolones     Patient was warned about not using Cipro and similar antibiotics. Recent studies have raised concern that fluoroquinolone antibiotics could be associated with an increased risk of aortic aneurysm Fluoroquinolones have non-antimicrobial properties that might jeopardise the integrity of the extracellular matrix of the vascular wall In a  propensity score matched cohort study in [place], there was a 66% increased rate of aortic aneurysm or dissection  associated with oral fluoroquinolone use, compared wit    Current Outpatient Medications  Medication Sig Dispense Refill   amLODipine (NORVASC) 5 MG tablet TAKE 1 TABLET BY MOUTH EVERY DAY (due FOR physical) 90 tablet 3   aspirin 325 MG tablet Take 325 mg by mouth every 4 (four) hours as needed for mild pain.     cetirizine (ZYRTEC) 10 MG tablet Take 10 mg by mouth daily as needed for allergies.      CRANBERRY POWDER PO Take by mouth. 15,000 UNITS DAILY     Cyanocobalamin (VITAMIN B-12 PO) Take 1 tablet by mouth daily.      desonide (DESOWEN) 0.05 % cream Apply 1 application topically daily as needed (rash).      diphenhydrAMINE HCl (BENADRYL ALLERGY PO) Take 1 tablet by mouth daily.     dutasteride (AVODART) 0.5 MG capsule TAKE ONE CAPSULE BY MOUTH DAILY 90 capsule 3   EPINEPHrine 0.3 mg/0.3 mL IJ SOAJ injection Inject 0.3 mLs (0.3 mg total) into the muscle as needed for anaphylaxis. 1 each 1   fluticasone (FLONASE) 50 MCG/ACT nasal spray INSTILL TWO SPRAYS IN EACH NOSTRIL DAILY AS NEEDED FOR ALLERGIES 48 g 1   hydrocortisone cream 1 % Apply 1 application topically daily as needed for itching.     lansoprazole (PREVACID) 30 MG capsule Take 1 capsule (30 mg total) by mouth 2 (two) times daily before a meal. 180 capsule 3   Multiple Vitamin (ONE DAILY) tablet Take by mouth.  Simethicone (GAS-X PO) Take 1 tablet by mouth daily as needed.      tamsulosin (FLOMAX) 0.4 MG CAPS capsule Take 0.8 mg by mouth daily.     valACYclovir (VALTREX) 1000 MG tablet Take 1 tablet (1,000 mg total) by mouth 3 (three) times daily. 30 tablet 0   vitamin C (ASCORBIC ACID) 500 MG tablet Take 500 mg by mouth daily.     No current facility-administered medications for this visit.      Physical Exam: Today's Vitals   04/14/21 1256  BP: 135/66  Pulse: 67  Resp: 20  SpO2: 94%  Weight: 196 lb (88.9 kg)  Height: 5\' 10"  (1.778 m)   Body mass index is 28.12 kg/m.   PHYSICAL EXAMINATION: General  appearance: alert, cooperative and appears older than stated age Head: Normocephalic, without obvious abnormality, atraumatic Neck: no adenopathy, no carotid bruit, no JVD, supple, symmetrical, trachea midline and thyroid not enlarged, symmetric, no tenderness/mass/nodules Lymph nodes: Cervical, supraclavicular, and axillary nodes normal. Resp: clear to auscultation bilaterally Back: symmetric, no curvature. ROM normal. No CVA tenderness. Cardio: regular rate and rhythm, S1, S2 normal, no murmur, click, rub or gallop GI: soft, non-tender; bowel sounds normal; no masses,  no organomegaly and Moderately obesity is present Extremities: extremities normal, atraumatic, no cyanosis or edema Neurologic: Grossly normal Patient has palpable DP and PT pulses bilaterally, popliteal pulses are enlarged on palpation the right is larger then the left - at least 3 cm   Diagnostic Studies & Laboratory data:     Recent Radiology Findings:   CLINICAL DATA:  Thoracic aortic aneurysm.   EXAM: CT ANGIOGRAPHY CHEST WITH CONTRAST   TECHNIQUE: Multidetector CT imaging of the chest was performed using the standard protocol during bolus administration of intravenous contrast. Multiplanar CT image reconstructions and MIPs were obtained to evaluate the vascular anatomy.   CONTRAST:  79mL ISOVUE-370 IOPAMIDOL (ISOVUE-370) INJECTION 76%   COMPARISON:  October 16, 2019.   FINDINGS: Cardiovascular: Atherosclerosis of thoracic aorta is noted without dissection. Tortuosity of descending thoracic aorta is noted. Ascending thoracic aorta measures 3.1 cm. Transverse aortic arch measures 3.0 cm. Distal descending thoracic aorta measures 2.7 cm. Aortic root dilatation is stable at 4.3 cm. Normal cardiac size. No pericardial effusion. Mild coronary artery calcifications are noted.   Mediastinum/Nodes: No enlarged mediastinal, hilar, or axillary lymph nodes. Thyroid gland, trachea, and esophagus demonstrate  no significant findings.   Lungs/Pleura: Lungs are clear. No pleural effusion or pneumothorax.   Upper Abdomen: Stable hepatic cysts are noted.   Musculoskeletal: No chest wall abnormality. No acute or significant osseous findings.   Review of the MIP images confirms the above findings.   IMPRESSION: Stable dilatation of aortic root is noted at 4.3 cm.   Stable tortuosity of descending thoracic aorta is noted. No dissection is noted.   Mild coronary artery calcifications are noted.   Aortic Atherosclerosis (ICD10-I70.0).     Electronically Signed   By: Marijo Conception M.D.   On: 04/14/2021 12:56 [place] Lower Ext Art Bilat  Result Date: 03/20/2018 CLINICAL DATA:  74 year old male with a history of palpable abnormality of the popliteal pulses. Cardiovascular risk factors include hypertension, family history, tobacco use, known vascular disease EXAM: NONINVASIVE PHYSIOLOGIC VASCULAR STUDY OF BILATERAL LOWER EXTREMITIES TECHNIQUE: Evaluation of both lower extremities was performed at rest, including directed duplex COMPARISON:  None. FINDINGS: Right Lower Extremity: Directed duplex of the right lower extremity demonstrates atherosclerotic changes of the common femoral artery with triphasic waveform. Triphasic profunda  femoris.  Triphasic SFA throughout its length. Popliteal artery measures 2.9 cm by duplex ultrasound transversely, 2.4 cm on longitudinal images. Evidence of atherosclerotic plaque and/or thrombus with a triphasic waveform maintained. Triphasic posterior tibial artery, peroneal artery, and anterior tibial artery. Left Lower Extremity: Directed duplex of the left lower extremity demonstrates atherosclerotic changes of the common femoral artery with triphasic waveform. Triphasic profunda femoris. Triphasic SFA throughout the length. Triphasic popliteal artery. Popliteal diameter measures at least 2.3 cm. Triphasic waveform of left tibial arteries. IMPRESSION: Directed duplex  demonstrates bilateral popliteal artery aneurysm, on the right measuring 2.9 cm, on the left at least 2.3 cm. Tibial arteries are patent bilaterally, with maintained waveforms. Signed, Dulcy Fanny. Dellia Nims, RPVI [facility] Radiology Electronically Signed   By: Corrie Mckusick D.O.   On: 03/20/2018 14:27   Ct Angio Chest Aorta W/cm &/or Wo/cm  Result Date: 03/14/2018 CLINICAL DATA:  Follow-up thoracic aortic aneurysm. EXAM: CT ANGIOGRAPHY CHEST WITH CONTRAST TECHNIQUE: Multidetector CT imaging of the chest was performed using the standard protocol during bolus administration of intravenous contrast. Multiplanar CT image reconstructions and MIPs were obtained to evaluate the vascular anatomy. CONTRAST:  58mL ISOVUE-370 IOPAMIDOL (ISOVUE-370) INJECTION 76% COMPARISON:  CTA chest dated 08/31/2016 and CTA chest dated 07/15/2015. FINDINGS: Cardiovascular: Stable mild dilatation of the aortic root, measuring 4 cm diameter. Ascending thoracic aorta and aortic arch are normal in caliber. Heart size is normal. No pericardial effusion. Scattered coronary artery calcifications. Mediastinum/Nodes: No mass or enlarged lymph nodes within the mediastinum or perihilar regions. Esophagus is unremarkable. Trachea and central bronchi are unremarkable. Lungs/Pleura: Lungs are clear. Upper Abdomen: Limited images of the upper abdomen are unremarkable. Hepatic cysts again noted. Musculoskeletal: Degenerative changes within the lower thoracic spine, mild to moderate in degree. No acute or suspicious osseous finding. Review of the MIP images confirms the above findings. IMPRESSION: 1. Stable mild dilatation of the aortic root, measuring 4 cm diameter. Recommend semi-annual imaging followup by CTA or MRA and referral to cardiothoracic surgery if not already obtained. This recommendation follows 2010 ACCF/AHA/AATS/ACR/ASA/SCA/SCAI/SIR/STS/SVM Guidelines for the Diagnosis and Management of  Patients With Thoracic Aortic Disease. Circulation. 2010; 121: [id number] 2. No acute findings. Aortic aneurysm NOS (ICD10-I71.9). Electronically Signed   By: Franki Cabot M.D.   On: 03/14/2018 10:29   D Ct Angio Chest Aorta W &/or Wo Contrast  Result Date: 08/31/2016 CLINICAL DATA:  Thoracic aortic aneurysm without rupture. EXAM: CT ANGIOGRAPHY CHEST WITH CONTRAST TECHNIQUE: Multidetector CT imaging of the chest was performed using the standard protocol during bolus administration of intravenous contrast. Multiplanar CT image reconstructions and MIPs were obtained to evaluate the vascular anatomy. Creatinine was obtained on site at [facility] at [address] Results: Creatinine 0.9 mg/dL. CONTRAST:  75 mL Isovue 370 COMPARISON:  07/15/2015 FINDINGS: Cardiovascular: Mild dilatation of the aortic root measuring up to 4.3 cm. This is unchanged from the previous examination. Normal caliber of the mid ascending thoracic aorta measuring roughly 3.1 cm. Proximal descending thoracic aorta measures 3.0 cm and stable. Mid descending thoracic aorta measures 2.9 cm. The descending thoracic aorta is tortuous. There is no evidence for an aortic dissection. Main pulmonary arteries are patent and there is no evidence to suggest a pulmonary embolism. Atherosclerotic calcifications involving the right coronary artery and the LAD. Great vessels are patent. Mediastinum/Nodes: Mild dilatation of the mid esophagus is similar to the previous examination. There is no chest lymphadenopathy. No axillary lymphadenopathy. No significant pericardial fluid.  Lungs/Pleura: Trachea and mainstem bronchi are patent. Mild volume loss at the lung bases. There is no evidence for airspace disease or lung consolidation. No significant pleural fluid. Upper Abdomen: Again noted are numerous hypodense lesions in the liver. Findings compatible with multiple hepatic cysts. Mild thickening of the left adrenal gland could represent  hyperplasia. Musculoskeletal: Disc space narrowing and degenerative changes in the lower cervical spine. Bridging osteophytes in lower thoracic spine. Review of the MIP images confirms the above findings. IMPRESSION: Stable dilatation of the aortic root, measuring up to 4.3 cm. Coronary artery calcifications. No acute chest abnormality. Multiple hepatic cysts. Electronically Signed   By: Markus Daft M.D.   On: 08/31/2016 14:26     Recent Lab Findings: Lab Results  Component Value Date   WBC 5.1 02/20/2019   HGB 14.3 02/20/2019   HCT 41.7 02/20/2019   PLT 187.0 02/20/2019   GLUCOSE 119 (H) 02/20/2019   CHOL 169 02/20/2019   TRIG 139.0 02/20/2019   HDL 42.10 02/20/2019   LDLCALC 99 02/20/2019   ALT 16 02/20/2019   AST 48 (H) 02/20/2019   NA 138 02/20/2019   K 4.2 02/20/2019   CL 103 02/20/2019   CREATININE 0.89 02/20/2019   BUN 17 02/20/2019   CO2 25 02/20/2019   TSH 1.20 02/20/2019   HGBA1C 5.4 02/20/2019   Echocardiogram 08/28/2016 Echocardiography   Patient:    Ehren, Berisha MR #:       [id number] Study Date: 08/28/2016 Gender:     M Age:        67 Height:     177.8 cm Weight:     101.6 kg BSA:        2.27 m^2 Pt. Status: Room:    ORDERING    Lanelle Bal MD  REFERRING   Lanelle Bal MD  ATTENDING   Candee Furbish, M.D.  PERFORMING  [facility], Outpatient   cc:   ------------------------------------------------------------------- LV EF: 55% -   60%   ------------------------------------------------------------------- Indications:      Ascending aortic aneurysm (I71.2).   ------------------------------------------------------------------- Study Conclusions   - Left ventricle: The cavity size was normal. Wall thickness was   increased in a pattern of moderate LVH. Systolic function was   normal. The estimated ejection fraction was in the range of 55%   to 60%. There is hypokinesis of the anteroseptal and apical   myocardium. Doppler parameters are consistent  with abnormal left   ventricular relaxation (grade 1 diastolic dysfunction). - Aortic valve: There was mild regurgitation. - Aorta: Aortic root dimension: 40 mm (ED), prior echocardiogram   68mm. - Ascending aorta: The ascending aorta was mildly dilated. - Mitral valve: There was trivial regurgitation. - Pulmonary arteries: Systolic pressure was mildly increased. PA   peak pressure: 33 mm Hg (S).   Impressions:   - Compared to the prior study, there has been no significant   interval change.   ------------------------------------------------------------------- Study data:  Comparison was made to the study of 06/10/2015.  Study status:  Routine.  Procedure:  The patient reported no pain pre or post test. Transthoracic echocardiography. Image quality was adequate.          Echocardiography.  M-mode, complete 2D, spectral Doppler, and color Doppler.  Birthdate:  Patient birthdate: [birth year].  Age:  Patient is 75 yr old.  Sex:  Gender: male. BMI: 32.1 kg/m^2.  Blood pressure:     152/71  Patient status: Outpatient.  Study date:  Study date: 08/28/2016. Study time: 09:54 AM.  Location:  [facility]   -------------------------------------------------------------------   ------------------------------------------------------------------- Left ventricle:  The cavity size was normal. Wall thickness was increased in a pattern of moderate LVH. Systolic function was normal. The estimated ejection fraction was in the range of 55% to 60%.  Regional wall motion abnormalities:   There is hypokinesis of the anteroseptal and apical myocardium. Doppler parameters are consistent with abnormal left ventricular relaxation (grade 1 diastolic dysfunction).   ------------------------------------------------------------------- Aortic valve:   Trileaflet; normal thickness leaflets. Mobility was not restricted.  Doppler:  Transvalvular velocity was within the normal range. There was no stenosis.  There was mild regurgitation.   ------------------------------------------------------------------- Aorta:  Ascending aorta: The ascending aorta was mildly dilated.   ------------------------------------------------------------------- Mitral valve:   Structurally normal valve.   Mobility was not restricted.  Doppler:  Transvalvular velocity was within the normal range. There was no evidence for stenosis. There was trivial regurgitation.   ------------------------------------------------------------------- Left atrium:  The atrium was normal in size.   ------------------------------------------------------------------- Right ventricle:  The cavity size was normal. Wall thickness was normal. Systolic function was normal.   ------------------------------------------------------------------- Pulmonic valve:    Doppler:  Transvalvular velocity was within the normal range. There was no evidence for stenosis.   ------------------------------------------------------------------- Tricuspid valve:   Structurally normal valve.    Doppler: Transvalvular velocity was within the normal range. There was no regurgitation.   ------------------------------------------------------------------- Pulmonary artery:   The main pulmonary artery was normal-sized. Systolic pressure was mildly increased.   ------------------------------------------------------------------- Right atrium:  The atrium was normal in size.   ------------------------------------------------------------------- Pericardium:  There was no pericardial effusion.   ------------------------------------------------------------------- Systemic veins: Inferior vena cava: The vessel was normal in size. The respirophasic diameter changes were in the normal range (= 50%), consistent with normal central venous pressure. Diameter: 18 mm.   ------------------------------------------------------------------- Measurements    IVC                                          Value        Reference  ID                                          18    mm     ---------    Left ventricle                              Value        Reference  LV ID, ED, PLAX chordal             (L)     40.6  mm     43 - 52  LV ID, ES, PLAX chordal                     28.1  mm     23 - 38  LV fx shortening, PLAX chordal              31    %      >=29  LV PW thickness, ED                         14.7  mm     ---------  IVS/LV PW ratio, ED                         1            <=1.3  Stroke volume, 2D                           98    ml     ---------  Stroke volume/bsa, 2D                       43    ml/m^2 ---------  LV e&', lateral                              9.76  cm/s   ---------  LV E/e&', lateral                            5.77         ---------  LV e&', medial                               6.91  cm/s   ---------  LV E/e&', medial                             8.15         ---------  LV e&', average                              8.34  cm/s   ---------  LV E/e&', average                            6.75         ---------    Ventricular septum                          Value        Reference  IVS thickness, ED                           14.7  mm     ---------    LVOT                                        Value        Reference  LVOT ID, S                                  24    mm     ---------  LVOT area                                   4.52  cm^2   ---------  LVOT peak velocity, S  82    cm/s   ---------  LVOT mean velocity, S                       59.9  cm/s   ---------  LVOT VTI, S                                 21.7  cm     ---------    Aortic valve                                Value        Reference  Aortic regurg pressure half-time            1620  ms     ---------    Aorta                                       Value        Reference  Aortic root ID, ED                          40    mm     ---------    Left atrium                                  Value        Reference  LA ID, A-P, ES                              37    mm     ---------  LA ID/bsa, A-P                              1.63  cm/m^2 <=2.2  LA volume, S                                45    ml     ---------  LA volume/bsa, S                            19.8  ml/m^2 ---------  LA volume, ES, 1-p A4C                      33    ml     ---------  LA volume/bsa, ES, 1-p A4C                  14.5  ml/m^2 ---------  LA volume, ES, 1-p A2C                      54    ml     ---------  LA volume/bsa, ES, 1-p A2C                  23.8  ml/m^2 ---------    Mitral valve  Value        Reference  Mitral E-wave peak velocity                 56.3  cm/s   ---------  Mitral A-wave peak velocity                 74    cm/s   ---------  Mitral deceleration time                    215   ms     150 - 230  Mitral E/A ratio, peak                      0.8          ---------    Pulmonary arteries                          Value        Reference  PA pressure, S, DP                  (H)     33    mm Hg  <=30    Tricuspid valve                             Value        Reference  Tricuspid regurg peak velocity              276   cm/s   ---------  Tricuspid peak RV-RA gradient               30    mm Hg  ---------  Tricuspid maximal regurg velocity,          276   cm/s   ---------  PISA    Systemic veins                              Value        Reference  Estimated CVP                               3     mm Hg  ---------    Right ventricle                             Value        Reference  RV pressure, S, DP                  (H)     33    mm Hg  <=30  RV s&', lateral, S                           9.65  cm/s   ---------   Legend: (L)  and  (H)  mark values outside specified reference range.   ------------------------------------------------------------------- Prepared and Electronically Authenticated by   Candee Furbish,  M.D. 2018-02-05T11:43:55  Aortic Size Index=     4.3    /Body surface area is 2.14 meters squared. = 1.9  < 2.75 cm/m2      4% risk per year 2.75 to 4.25  8% risk per year > 4.25 cm/m2    20% risk per year  cross sectional area of aorta cm2/height in meters > 10 consider  surgery     Assessment / Plan: 1/Stable mild dilatation of the aortic root 4.3 cm with no change over the past 18 months-we will repeat CTA of the chest in 18 months. This is far away from the surgical threshold of 5.5 cm.   2/bilateral popliteal artery aneurysm, enlarged- previous measurement 2019 -  right measuring 2.9 cm, on the left at least 2.3 cm. - referr back to vascular lab for repeat duplex scan of popiteal arteries   3/Status post ablation by Dr. Anastasio Auerbach notes that currently he has no cardiology follow-up.  Cardiology follow-up in [place] with an Echocardiogram.    Plan: Maintain good BP control, well controlled today and at home per patient. We reviewed diet and exercise. He is to make an appointment with his Cardiologist and follow-up with vascular surgery for his popliteal artery aneurysm. He can return to our office in 18 months with a CTA of th chest for continued surveillance. He is welcome to call our office for any questions or concerns between now and then.   No fluoroquinolones No heavy lifting   Nicholes Rough, PA-C      [place] [place], [address] Office [phone]

## 2021-05-04 ENCOUNTER — Telehealth: Payer: Self-pay | Admitting: Adult Health

## 2021-05-04 DIAGNOSIS — N401 Enlarged prostate with lower urinary tract symptoms: Secondary | ICD-10-CM

## 2021-05-04 DIAGNOSIS — N138 Other obstructive and reflux uropathy: Secondary | ICD-10-CM

## 2021-05-04 NOTE — Telephone Encounter (Signed)
Pt needs a refill on Dutasteride 0.5 mg Capsule, Pt has 1 capsule left.  Pharmacy- [facility]

## 2021-05-05 MED ORDER — DUTASTERIDE 0.5 MG PO CAPS
0.5000 mg | ORAL_CAPSULE | Freq: Every day | ORAL | 0 refills | Status: DC
Start: 2021-05-05 — End: 2021-06-09

## 2021-05-05 NOTE — Telephone Encounter (Signed)
Pt is ue for CPE. Looks like pt is scheduled will refill more at CPE visit.

## 2021-05-13 ENCOUNTER — Telehealth: Payer: Self-pay | Admitting: Adult Health

## 2021-05-13 ENCOUNTER — Other Ambulatory Visit: Payer: Self-pay

## 2021-05-13 MED ORDER — TAMSULOSIN HCL 0.4 MG PO CAPS: 0.8000 mg | ORAL_CAPSULE | Freq: Every day | ORAL | 3 refills | Status: DC

## 2021-05-13 NOTE — Telephone Encounter (Signed)
Rx refilled.

## 2021-05-13 NOTE — Telephone Encounter (Signed)
Pt is calling and does have cpe sch for  06-09-2021 and needs a refill on tamsulosin #60  [place], [place] - [address] Phone:  [phone]  Fax:  [phone]

## 2021-06-09 ENCOUNTER — Ambulatory Visit (INDEPENDENT_AMBULATORY_CARE_PROVIDER_SITE_OTHER): Payer: Medicare Other | Admitting: Adult Health

## 2021-06-09 VITALS — BP 122/60 | HR 60 | Temp 97.7°F | Ht 70.0 in | Wt 199.4 lb

## 2021-06-09 DIAGNOSIS — M546 Pain in thoracic spine: Secondary | ICD-10-CM

## 2021-06-09 DIAGNOSIS — R3911 Hesitancy of micturition: Secondary | ICD-10-CM

## 2021-06-09 DIAGNOSIS — N401 Enlarged prostate with lower urinary tract symptoms: Secondary | ICD-10-CM

## 2021-06-09 DIAGNOSIS — I10 Essential (primary) hypertension: Secondary | ICD-10-CM

## 2021-06-09 DIAGNOSIS — E782 Mixed hyperlipidemia: Secondary | ICD-10-CM

## 2021-06-09 DIAGNOSIS — R4189 Other symptoms and signs involving cognitive functions and awareness: Secondary | ICD-10-CM

## 2021-06-09 DIAGNOSIS — K219 Gastro-esophageal reflux disease without esophagitis: Secondary | ICD-10-CM | POA: Diagnosis not present

## 2021-06-09 DIAGNOSIS — G8929 Other chronic pain: Secondary | ICD-10-CM | POA: Diagnosis not present

## 2021-06-09 DIAGNOSIS — I700 Atherosclerosis of aorta: Secondary | ICD-10-CM

## 2021-06-09 DIAGNOSIS — I714 Abdominal aortic aneurysm, without rupture, unspecified: Secondary | ICD-10-CM | POA: Diagnosis not present

## 2021-06-09 LAB — COMPREHENSIVE METABOLIC PANEL
ALT: 13 U/L (ref 0–53)
AST: 44 U/L — ABNORMAL HIGH (ref 0–37)
Albumin: 4.3 g/dL (ref 3.5–5.2)
Alkaline Phosphatase: 85 U/L (ref 39–117)
BUN: 19 mg/dL (ref 6–23)
CO2: 31 mEq/L (ref 19–32)
Calcium: 8.9 mg/dL (ref 8.4–10.5)
Chloride: 102 mEq/L (ref 96–112)
Creatinine, Ser: 1.01 mg/dL (ref 0.40–1.50)
GFR: 72.76 mL/min (ref >60.00)
Glucose, Bld: 91 mg/dL (ref 70–99)
Potassium: 4.1 mEq/L (ref 3.5–5.1)
Sodium: 138 mEq/L (ref 135–145)
Total Bilirubin: 0.7 mg/dL (ref 0.2–1.2)
Total Protein: 6.3 g/dL (ref 6.0–8.3)

## 2021-06-09 LAB — LIPID PANEL
Cholesterol: 150 mg/dL (ref 0–200)
HDL: 47.1 mg/dL (ref >39.00)
LDL Cholesterol: 90 mg/dL (ref 0–99)
NonHDL: 102.77
Total CHOL/HDL Ratio: 3
Triglycerides: 65 mg/dL (ref 0.0–149.0)
VLDL: 13 mg/dL (ref 0.0–40.0)

## 2021-06-09 LAB — CBC WITH DIFFERENTIAL/PLATELET
Basophils Absolute: 0 10*3/uL (ref 0.0–0.1)
Basophils Relative: 0.7 % (ref 0.0–3.0)
Eosinophils Absolute: 0.3 10*3/uL (ref 0.0–0.7)
Eosinophils Relative: 5.9 % — ABNORMAL HIGH (ref 0.0–5.0)
HCT: 40.7 % (ref 39.0–52.0)
Hemoglobin: 13.9 g/dL (ref 13.0–17.0)
Lymphocytes Relative: 20.2 % (ref 12.0–46.0)
Lymphs Abs: 1 10*3/uL (ref 0.7–4.0)
MCHC: 34.1 g/dL (ref 30.0–36.0)
MCV: 97 fl (ref 78.0–100.0)
Monocytes Absolute: 0.5 10*3/uL (ref 0.1–1.0)
Monocytes Relative: 10.5 % (ref 3.0–12.0)
Neutro Abs: 3.1 10*3/uL (ref 1.4–7.7)
Neutrophils Relative %: 62.7 % (ref 43.0–77.0)
Platelets: 225 10*3/uL (ref 150.0–400.0)
RBC: 4.19 Mil/uL — ABNORMAL LOW (ref 4.22–5.81)
RDW: 12.7 % (ref 11.5–15.5)
WBC: 5 10*3/uL (ref 4.0–10.5)

## 2021-06-09 LAB — PSA: PSA: 0.96 ng/mL (ref 0.10–4.00)

## 2021-06-09 LAB — TSH: TSH: 1.33 u[IU]/mL (ref 0.35–5.50)

## 2021-06-09 MED ORDER — AMLODIPINE BESYLATE 5 MG PO TABS: ORAL_TABLET | ORAL | 3 refills | Status: DC

## 2021-06-09 MED ORDER — DUTASTERIDE 0.5 MG PO CAPS: 0.5000 mg | ORAL_CAPSULE | Freq: Every day | ORAL | 3 refills | Status: DC

## 2021-06-09 NOTE — Progress Notes (Signed)
Subjective:    Patient ID: Daniel Guerrero, male    DOB: 1945-09-16, 75 y.o.   MRN: [id number]  HPI Patient presents for yearly preventative medicine examination. He is a pleasant 75 year old male who  has a past medical history of Allergy, BPH (benign prostatic hyperplasia), Cataract of both eyes, Eczema, Enlarged aorta (HCC), Erectile dysfunction, Hearing loss, History of inguinal hernia repair, bilateral, Hypertension, Kidney stones, Loss of hearing, Meniere disease, Paroxysmal SVT (supraventricular tachycardia) ([place]), Psoriasis, S/P appendectomy, S/P tonsillectomy, Seasonal allergies, and SVT (supraventricular tachycardia) ([place]).  Essential hypertension-takes Norvasc 5 mg daily.  He denies chest pain, shortness of breath, dizziness, lightheadedness, or syncopal episodes BP Readings from Last 3 Encounters:  04/14/21 135/66  03/02/21 (!) 143/80  02/01/21 (!) 148/82   BPH with Nocturia -takes Flomax and Avodart. Feels contolled.   GERD-controlled with Prevacid 30 mg daily  History of AAA-is followed by Dr. Donnetta Hutching on a yearly basis.  His last CT scan showed abdominal aortic aneurysm of 4.3 cm ( 2022) Watchful waiting for the time being  Hyperlipidemia/Mild Aortic Athlersclorosis - diet controlled.   Lab Results  Component Value Date   CHOL 141 06/08/2020   HDL 39 (L) 06/08/2020   LDLCALC 82 06/08/2020   TRIG 106 06/08/2020   CHOLHDL 3.6 06/08/2020   Mild Cognitive Impairment -seen by neurology on 03/02/2021.  At this time his MoCA was 20 out of 30 with deficiencies in memory, attention, language, and delayed recall as well as orientation.  He was started on Namenda 5 mg twice daily.  He does feel as though his cognition has improved since starting Nemenda, is no longer repeating the same questions as much.   Chronic Back pain - mostly located in cervical and thoracic spine. He does walk hunched over. Would like some PT to help him .   All immunizations and health maintenance  protocols were reviewed with the patient and needed orders were placed.  Appropriate screening laboratory values were ordered for the patient including screening of hyperlipidemia, renal function and hepatic function. If indicated by BPH, a PSA was ordered.  Medication reconciliation,  past medical history, social history, problem list and allergies were reviewed in detail with the patient  Goals were established with regard to weight loss, exercise, and  diet in compliance with medications  Review of Systems  Constitutional: Negative.   HENT:  Positive for hearing loss.   Eyes: Negative.   Respiratory: Negative.    Cardiovascular: Negative.   Gastrointestinal: Negative.   Endocrine: Negative.   Genitourinary: Negative.   Musculoskeletal:  Positive for arthralgias and back pain.  Skin: Negative.   Allergic/Immunologic: Negative.   Neurological: Negative.   Hematological: Negative.   Psychiatric/Behavioral: Negative.    All other systems reviewed and are negative.  Past Medical History:  Diagnosis Date   Allergy    BPH (benign prostatic hyperplasia)    Cataract of both eyes    implants both eyes    Eczema    Enlarged aorta (HCC)    Erectile dysfunction    Hearing loss    high frequency   History of inguinal hernia repair, bilateral    Hypertension    Kidney stones    Loss of hearing    high frequency   Meniere disease    Paroxysmal SVT (supraventricular tachycardia) (HCC)    Psoriasis    S/P appendectomy    S/P tonsillectomy    Seasonal allergies    SVT (supraventricular tachycardia) (HCC)  Social History   Socioeconomic History   Marital status: Married    Spouse name: Not on file   Number of children: 3   Years of education: 16   Highest education level: Bachelor's degree (e.g., BA, AB, BS)  Occupational History   Occupation: professor    Comment: retired  Tobacco Use   Smoking status: Former    Types: Cigarettes    Quit date: 07/24/1964    Years  since quitting: 56.9   Smokeless tobacco: Never   Tobacco comments:    Quit 20-30 years ago; smoked pipe and cigars   Vaping Use   Vaping Use: Never used  Substance and Sexual Activity   Alcohol use: Yes    Alcohol/week: 2.0 standard drinks    Types: 2 Cans of beer per week    Comment: 2 per day   Drug use: No   Sexual activity: Not on file  Other Topics Concern   Not on file  Social History Narrative   Daily Caffeine use: 5-6 drinks daily      08/07/2018: No drug use, some daily alcohol use at night but patient does not feel like it disrupts his health or is a safety concern.       Lives with wife in two story home; three sons, five grandchildren in [place] area.   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    Right handed            Social Determinants of Health   Financial Resource Strain: Low Risk    Difficulty of Paying Living Expenses: Not hard at all  Food Insecurity: No Food Insecurity   Worried About Charity fundraiser in the Last Year: Never true   Ran Out of Food in the Last Year: Never true  Transportation Needs: No Transportation Needs   Lack of Transportation (Medical): No   Lack of Transportation (Non-Medical): No  Physical Activity: Insufficiently Active   Days of Exercise per Week: 4 days   Minutes of Exercise per Session: 30 min  Stress: No Stress Concern Present   Feeling of Stress : Only a little  Social Connections: Moderately Isolated   Frequency of Communication with Friends and Family: More than three times a week   Frequency of Social Gatherings with Friends and Family: Once a week   Attends Religious Services: Never   Marine scientist or Organizations: No   Attends Music therapist: Never   Marital Status: Married  Human resources officer Violence: Not At Risk   Fear of Current or Ex-Partner: No   Emotionally Abused: No   Physically Abused: No   Sexually Abused:  No    Past Surgical History:  Procedure Laterality Date   Ablasion     APPENDECTOMY     CARDIAC ELECTROPHYSIOLOGY MAPPING AND ABLATION  2015   CHOLECYSTECTOMY     COLONOSCOPY     unsure where or when- states greater than 10 yrs ago per pt- was normal per pt and his wife    COLONOSCOPY WITH PROPOFOL N/A 08/26/2018   Procedure: COLONOSCOPY WITH PROPOFOL;  Surgeon: [name], MD;  [facility];  Service: Endoscopy;  Laterality: N/A;  10:00am   CYSTOSCOPY WITH RETROGRADE PYELOGRAM, URETEROSCOPY AND STENT PLACEMENT Left 12/30/2014   Procedure: CYSTOSCOPY WITH LEFT URETEROSCOPY STONE EXTRACTION WITH STENT;  Surgeon: Franchot Gallo, MD;  Location: [facility];  Service: Urology;  Laterality: Left;  ELECTROPHYSIOLOGIC STUDY N/A 08/24/2015   Procedure: SVT Ablation;  Surgeon: Thompson Grayer, MD;  Location: [facility];  Service: Cardiovascular;  Laterality: N/A;   ENDOVENOUS ABLATION SAPHENOUS VEIN W/ LASER Right 12/04/2016   endovenous laser ablation right greater saphenous vein by Tinnie Gens MD    ESOPHAGOGASTRODUODENOSCOPY N/A 08/18/2016   Procedure: ESOPHAGOGASTRODUODENOSCOPY (EGD);  Surgeon: [name], MD;  [facility];  Service: Endoscopy;  Laterality: N/A;   ESOPHAGOGASTRODUODENOSCOPY N/A 09/06/2016   Procedure: ESOPHAGOGASTRODUODENOSCOPY (EGD);  Surgeon: [name], MD;  [facility];  Service: Endoscopy;  Laterality: N/A;  100 - moved to 2/14 @ 2:15 per Tretha Sciara   ESOPHAGOGASTRODUODENOSCOPY (EGD) WITH PROPOFOL N/A 08/26/2018   Procedure: ESOPHAGOGASTRODUODENOSCOPY (EGD) WITH PROPOFOL;  Surgeon: [name], MD;  [facility];  Service: Endoscopy;  Laterality: N/A;   EYE SURGERY     Catracts removed both eye 30 yrs ago   [place]     right and left   HOLMIUM LASER APPLICATION Left 12/23/7033   Procedure: HOLMIUM LASER APPLICATION;  Surgeon: Franchot Gallo, MD;  Location: [facility];  Service: Urology;  Laterality: Left;    MALONEY DILATION N/A 09/06/2016   Procedure: Venia Minks DILATION;  Surgeon: [name], MD;  [facility];  Service: Endoscopy;  Laterality: N/A;   MALONEY DILATION N/A 08/26/2018   Procedure: Venia Minks DILATION;  Surgeon: [name], MD;  [facility];  Service: Endoscopy;  Laterality: N/A;   POLYPECTOMY  08/26/2018   Procedure: POLYPECTOMY;  Surgeon: [name], MD;  [facility];  Service: Endoscopy;;  polyp at descending colon x2   TONSILLECTOMY      Family History  Problem Relation Age of Onset   Cancer Mother        lung   Thyroid disease Mother    Diabetes Father    GI Bleed Father    Aneurysm Father    AAA (abdominal aortic aneurysm) Father    AAA (abdominal aortic aneurysm) Brother    Colon cancer Neg Hx    Colon polyps Neg Hx    Rectal cancer Neg Hx    Stomach cancer Neg Hx     Allergies  Allergen Reactions   Peanut-Containing Drug Products Hives   Penicillins Hives    Has patient had a PCN reaction causing immediate rash, facial/tongue/throat swelling, SOB or lightheadedness with hypotension: No Has patient had a PCN reaction causing severe rash involving mucus membranes or skin necrosis: No Has patient had a PCN reaction that required hospitalization No Has patient had a PCN reaction occurring within the last 10 years: No If all of the above answers are "NO", then may proceed with Cephalosporin use.   Quinolones     Patient was warned about not using Cipro and similar antibiotics. Recent studies have raised concern that fluoroquinolone antibiotics could be associated with an increased risk of aortic aneurysm Fluoroquinolones have non-antimicrobial properties that might jeopardise the integrity of the extracellular matrix of the vascular wall In a  propensity score matched cohort study in [place], there was a 66% increased rate of aortic aneurysm or dissection associated with oral fluoroquinolone use, compared wit    Current  Outpatient Medications on File Prior to Visit  Medication Sig Dispense Refill   amLODipine (NORVASC) 5 MG tablet TAKE 1 TABLET BY MOUTH EVERY DAY 90 tablet 3   cetirizine (ZYRTEC) 10 MG tablet Take 10 mg by mouth daily as needed for  allergies.      CRANBERRY POWDER PO Take by mouth. 15,000 UNITS as needed     Cyanocobalamin (VITAMIN B-12 PO) Take 1 tablet by mouth daily.      desonide (DESOWEN) 0.05 % cream Apply 1 application topically daily as needed (rash).      diphenhydrAMINE HCl (BENADRYL ALLERGY PO) Take 1 tablet by mouth daily.     dutasteride (AVODART) 0.5 MG capsule Take 1 capsule (0.5 mg total) by mouth daily. 30 capsule 0   EPINEPHrine 0.3 mg/0.3 mL IJ SOAJ injection Inject 0.3 mLs (0.3 mg total) into the muscle as needed for anaphylaxis. 1 each 1   fluticasone (FLONASE) 50 MCG/ACT nasal spray INSTILL TWO SPRAYS IN EACH NOSTRIL DAILY AS NEEDED FOR ALLERGIES 48 g 1   hydrocortisone cream 1 % Apply 1 application topically daily as needed for itching.     lansoprazole (PREVACID) 30 MG capsule Take 1 capsule (30 mg total) by mouth daily before breakfast. 90 capsule 3   loperamide (IMODIUM) 2 MG capsule Take by mouth as needed for diarrhea or loose stools. Patient takes every 2-3 days. (Patient not taking: No sig reported)     memantine (NAMENDA) 10 MG tablet Take 1 tablet (5 mg at night) for 2 weeks, then increase to 1 tablet (5 mg) twice a day 60 tablet 11   Multiple Vitamin (ONE DAILY) tablet Take by mouth.     Simethicone (GAS-X PO) Take 1 tablet by mouth daily as needed.      tamsulosin (FLOMAX) 0.4 MG CAPS capsule Take 2 capsules (0.8 mg total) by mouth daily. 180 capsule 3   vitamin C (ASCORBIC ACID) 500 MG tablet Take 500 mg by mouth daily.     No current facility-administered medications on file prior to visit.    There were no vitals taken for this visit.       Objective:   Physical Exam Vitals and nursing note reviewed.  Constitutional:      General: He is not in  acute distress.    Appearance: Normal appearance. He is well-developed. He is obese.  HENT:     Head: Normocephalic and atraumatic.     Right Ear: Tympanic membrane, ear canal and external ear normal. There is no impacted cerumen.     Left Ear: Tympanic membrane, ear canal and external ear normal. There is no impacted cerumen.     Ears:     Comments: Bilateral hearing aids     Nose: Nose normal. No congestion or rhinorrhea.     Mouth/Throat:     Mouth: Mucous membranes are moist.     Pharynx: Oropharynx is clear. No oropharyngeal exudate or posterior oropharyngeal erythema.  Eyes:     General:        Right eye: No discharge.        Left eye: No discharge.     Extraocular Movements: Extraocular movements intact.     Conjunctiva/sclera: Conjunctivae normal.     Pupils: Pupils are equal, round, and reactive to light.  Neck:     Vascular: No carotid bruit.     Trachea: No tracheal deviation.  Cardiovascular:     Rate and Rhythm: Normal rate and regular rhythm.     Pulses: Normal pulses.     Heart sounds: Normal heart sounds. No murmur heard.   No friction rub. No gallop.  Pulmonary:     Effort: Pulmonary effort is normal. No respiratory distress.     Breath sounds: Normal breath sounds.  No stridor. No wheezing, rhonchi or rales.  Chest:     Chest wall: No tenderness.  Abdominal:     General: Bowel sounds are normal. There is no distension.     Palpations: Abdomen is soft. There is no mass.     Tenderness: There is no abdominal tenderness. There is no right CVA tenderness, left CVA tenderness, guarding or rebound.     Hernia: No hernia is present.  Musculoskeletal:        General: No swelling, tenderness, deformity or signs of injury. Normal range of motion.     Right lower leg: No edema.     Left lower leg: No edema.  Lymphadenopathy:     Cervical: No cervical adenopathy.  Skin:    General: Skin is warm and dry.     Capillary Refill: Capillary refill takes less than 2  seconds.     Coloration: Skin is not jaundiced or pale.     Findings: No bruising, erythema, lesion or rash.  Neurological:     General: No focal deficit present.     Mental Status: He is alert and oriented to person, place, and time.     Cranial Nerves: No cranial nerve deficit.     Sensory: No sensory deficit.     Motor: No weakness.     Coordination: Coordination normal.     Gait: Gait normal.     Deep Tendon Reflexes: Reflexes normal.  Psychiatric:        Mood and Affect: Mood normal.        Behavior: Behavior normal.        Thought Content: Thought content normal.        Judgment: Judgment normal.      Assessment & Plan:   1. Cognitive impairment - Follow up with Neurology as directed  - CBC with Differential/Platelet; Future - Comprehensive metabolic panel; Future - Lipid panel; Future - TSH; Future  2. Essential hypertension - Well controlled. No change in medications  - CBC with Differential/Platelet; Future - Comprehensive metabolic panel; Future - Lipid panel; Future - TSH; Future - amLODipine (NORVASC) 5 MG tablet; TAKE 1 TABLET BY MOUTH EVERY DAY  Dispense: 90 tablet; Refill: 3  3. Benign prostatic hyperplasia with urinary hesitancy - Continue Avodart and Flomax - dutasteride (AVODART) 0.5 MG capsule; Take 1 capsule (0.5 mg total) by mouth daily.  Dispense: 90 capsule; Refill: 3 - CBC with Differential/Platelet; Future - Comprehensive metabolic panel; Future - Lipid panel; Future - TSH; Future - PSA; Future  4. Mixed hyperlipidemia - Consider statin  - CBC with Differential/Platelet; Future - Comprehensive metabolic panel; Future - Lipid panel; Future - TSH; Future  5. Gastroesophageal reflux disease without esophagitis - Continue Prevacid - CBC with Differential/Platelet; Future - Comprehensive metabolic panel; Future - Lipid panel; Future - TSH; Future  6. Abdominal aortic aneurysm (AAA) without rupture, unspecified part - Follow up with  Cadiothoracic as directed  - CBC with Differential/Platelet; Future - Comprehensive metabolic panel; Future - Lipid panel; Future - TSH; Future  7. Atherosclerosis of aorta (HCC) - Continue heart healthy diet    8. Chronic midline thoracic back pain  - Ambulatory referral to Physical Therapy   Dorothyann Peng, NP

## 2021-06-09 NOTE — Patient Instructions (Addendum)
It was great seeing you today   We will follow up with you regarding your labs  Look for O'Keeffe's Working Hands cream at the drug store ( it is in a lime green container.   Someone from PT will call you to schedule

## 2021-06-21 DIAGNOSIS — Z23 Encounter for immunization: Secondary | ICD-10-CM | POA: Diagnosis not present

## 2021-06-21 DIAGNOSIS — Z20828 Contact with and (suspected) exposure to other viral communicable diseases: Secondary | ICD-10-CM | POA: Diagnosis not present

## 2021-07-05 ENCOUNTER — Other Ambulatory Visit: Payer: Self-pay

## 2021-07-05 ENCOUNTER — Ambulatory Visit (HOSPITAL_COMMUNITY): Payer: Medicare Other | Attending: Adult Health

## 2021-07-05 DIAGNOSIS — G8929 Other chronic pain: Secondary | ICD-10-CM | POA: Diagnosis not present

## 2021-07-05 DIAGNOSIS — M546 Pain in thoracic spine: Secondary | ICD-10-CM | POA: Insufficient documentation

## 2021-07-05 DIAGNOSIS — R293 Abnormal posture: Secondary | ICD-10-CM | POA: Insufficient documentation

## 2021-07-05 DIAGNOSIS — R2681 Unsteadiness on feet: Secondary | ICD-10-CM | POA: Diagnosis not present

## 2021-07-05 NOTE — Patient Instructions (Signed)
Access Code: [email or web address] URL: [email or web address] Date: 07/05/2021 Prepared by: Sherlyn Lees  Exercises Supine Thoracic Mobilization Foam Roll Horizontal with Arm Stretch - 1 x daily - 7 x weekly - 3 sets - 10 reps Shoulder External Rotation and Scapular Retraction with Resistance - 1 x daily - 7 x weekly - 3 sets - 10 reps - 3 sec hold

## 2021-07-05 NOTE — Therapy (Signed)
[place], [place], [address] Phone: [phone]   Fax:  [phone]  Physical Therapy Evaluation  Patient Details  Name: Daniel Guerrero MRN: [id number] Date of Birth: 11-24-1945 Referring Provider (PT): Dorothyann Peng, NP   Encounter Date: 07/05/2021   PT End of Session - 07/05/21 1335     Visit Number 1    Number of Visits 4    Date for PT Re-Evaluation 08/16/21    Authorization Type Medicare Part A and B, medicare guidlines    PT Start Time 1336    PT Stop Time 1430    PT Time Calculation (min) 54 min    Activity Tolerance Patient tolerated treatment well    Behavior During Therapy [facility] for tasks assessed/performed             Past Medical History:  Diagnosis Date   Allergy    BPH (benign prostatic hyperplasia)    Cataract of both eyes    implants both eyes    Eczema    Enlarged aorta (HCC)    Erectile dysfunction    Hearing loss    high frequency   History of inguinal hernia repair, bilateral    Hypertension    Kidney stones    Loss of hearing    high frequency   Meniere disease    Paroxysmal SVT (supraventricular tachycardia) (HCC)    Psoriasis    S/P appendectomy    S/P tonsillectomy    Seasonal allergies    SVT (supraventricular tachycardia) ([place])     Past Surgical History:  Procedure Laterality Date   Ablasion     APPENDECTOMY     CARDIAC ELECTROPHYSIOLOGY MAPPING AND ABLATION  2015   CHOLECYSTECTOMY     COLONOSCOPY     unsure where or when- states greater than 10 yrs ago per pt- was normal per pt and his wife    COLONOSCOPY WITH PROPOFOL N/A 08/26/2018   Procedure: COLONOSCOPY WITH PROPOFOL;  Surgeon: [name], MD;  [facility];  Service: Endoscopy;  Laterality: N/A;  10:00am   CYSTOSCOPY WITH RETROGRADE PYELOGRAM, URETEROSCOPY AND STENT PLACEMENT Left 12/30/2014   Procedure: CYSTOSCOPY WITH LEFT URETEROSCOPY STONE EXTRACTION WITH STENT;  Surgeon: Franchot Gallo, MD;  [facility];  Service: Urology;  Laterality: Left;   ELECTROPHYSIOLOGIC STUDY N/A 08/24/2015   Procedure: SVT Ablation;  Surgeon: Thompson Grayer, MD;  Location: [place] [facility];  Service: Cardiovascular;  Laterality: N/A;   ENDOVENOUS ABLATION SAPHENOUS VEIN W/ LASER Right 12/04/2016   endovenous laser ablation right greater saphenous vein by Tinnie Gens MD    ESOPHAGOGASTRODUODENOSCOPY N/A 08/18/2016   Procedure: ESOPHAGOGASTRODUODENOSCOPY (EGD);  Surgeon: [name], MD;  [facility];  Service: Endoscopy;  Laterality: N/A;   ESOPHAGOGASTRODUODENOSCOPY N/A 09/06/2016   Procedure: ESOPHAGOGASTRODUODENOSCOPY (EGD);  Surgeon: [name], MD;  [facility];  Service: Endoscopy;  Laterality: N/A;  100 - moved to 2/14 @ 2:15 per Tretha Sciara   ESOPHAGOGASTRODUODENOSCOPY (EGD) WITH PROPOFOL N/A 08/26/2018   Procedure: ESOPHAGOGASTRODUODENOSCOPY (EGD) WITH PROPOFOL;  Surgeon: [name], MD;  [facility];  Service: Endoscopy;  Laterality: N/A;   EYE SURGERY     Catracts removed both eye 30 yrs ago   [place]     right and left   HOLMIUM LASER APPLICATION Left 0/08/5850   Procedure: HOLMIUM LASER APPLICATION;  Surgeon: Franchot Gallo, MD;  [facility];  Service: Urology;  Laterality: Left;   MALONEY DILATION N/A 09/06/2016   Procedure: Venia Minks DILATION;  Surgeon: [name], MD;  [facility];  Service: Endoscopy;  Laterality: N/A;   MALONEY DILATION N/A 08/26/2018   Procedure: Venia Minks DILATION;  Surgeon: [name], MD;  [facility];  Service: Endoscopy;  Laterality: N/A;   POLYPECTOMY  08/26/2018   Procedure: POLYPECTOMY;  Surgeon: [name], MD;  [facility];  Service: Endoscopy;;  polyp at descending colon x2   TONSILLECTOMY      There were no vitals filed for this visit.    Subjective Assessment - 07/05/21 1338     Subjective Pt notes issues with posture and some chronic  back problems but is not presently having an acute episode.  Chief complaint being increasing difficulty with upright posture, LE weakness, and some balance/coordination deficits.  Denies any instances of falling but has frequent LOB following position change and when challenged with narrow BOS    Currently in Pain? Yes    Pain Score 2     Pain Location Back    Pain Orientation Mid    Pain Descriptors / Indicators Discomfort    Pain Type Chronic pain                [facility] PT Assessment - 07/05/21 0001       Assessment   Medical Diagnosis Chronic midline thoracic back pain    Referring Provider (PT) Dorothyann Peng, NP      Balance Screen   Has the patient fallen in the past 6 months No    Has the patient had a decrease in activity level because of a fear of falling?  No    Is the patient reluctant to leave their home because of a fear of falling?  No      Home Ecologist residence    Living Arrangements Spouse/significant other    Available Help at Discharge Family    Type of [place]      Prior Function   Level of [place] Retired      Observation/Other Assessments   Focus on Therapeutic Outcomes (FOTO)  70.48% function      Coordination   Gross Motor Movements are Fluid and Coordinated Yes      Functional Tests   Functional tests Squat;Single leg stance      Squat   Comments WNL      Single Leg Stance   Comments 1-2 sec      Posture/Postural Control   Posture/Postural Control Postural limitations    Postural Limitations Forward head;Increased thoracic kyphosis      ROM / Strength   AROM / PROM / Strength AROM;Strength      AROM   AROM Assessment Site Thoracic;Lumbar    Lumbar Flexion WNL    Lumbar Extension 25% limited    Lumbar - Right Side Bend 10% limited    Lumbar - Left Side Bend 10% limited    Thoracic Flexion increased kyphosis upper T-spine    Thoracic Extension decreased       Strength   Overall Strength Deficits    Overall Strength Comments BLE generalized      Transfers   Transfers Sit to Stand    Sit to Stand 7: Independent      Ambulation/Gait   Ambulation/Gait Yes    Ambulation/Gait Assistance 7: Independent    Gait Pattern Within Functional Limits;Step-through  pattern      Standardized Balance Assessment   Standardized Balance Assessment Dynamic Gait Index      Dynamic Gait Index   Level Surface Normal    Change in Gait Speed Normal    Gait with Horizontal Head Turns Mild Impairment    Gait with Vertical Head Turns Mild Impairment    Gait and Pivot Turn Mild Impairment    Step Over Obstacle Normal    Step Around Obstacles Normal    Steps Normal    Total Score 21                        Objective measurements completed on examination: See above findings.       [place] Adult PT Treatment/Exercise - 07/05/21 0001       Exercises   Exercises Shoulder      Shoulder Exercises: Supine   Other Supine Exercises supine T-spine mobilization      Shoulder Exercises: Seated   External Rotation Strengthening;Both;20 reps                     PT Education - 07/05/21 1518     Education Details education/discussion regarding benefits of LE strength training coupled with dynamic/balance activities to improve balance, strength, and reduce risk for falls    Person(s) Educated Patient    Methods Explanation    Comprehension Verbalized understanding              PT Short Term Goals - 07/05/21 1525       PT SHORT TERM GOAL #1   Title Demonstrate improved balance as evidenced by 24/24 Dynamic Gait Index    Baseline 21/24    Time 2    Period Weeks    Status New    Target Date 07/19/21      PT SHORT TERM GOAL #2   Title Patient will be independent with HEP in order to improve functional outcomes.    Time 2    Period Weeks    Status New    Target Date 07/19/21               PT Long Term Goals - 07/05/21  1526       PT LONG TERM GOAL #1   Title Patient will improve FOTO score by at least 5 points in order to indicate improved tolerance to activity.    Baseline 70% function    Time 4    Period Weeks    Status New    Target Date 08/02/21      PT LONG TERM GOAL #2   Title Demo independence with complex HEP for t-spine posture, LE strength and balance for self-management    Time 4    Period Weeks    Status New    Target Date 08/16/21                    Plan - 07/05/21 1519     Clinical Impression Statement Patient is 75 yo gentleman with chronic mid back pain and notes postural abnormalities causing strain and limiting his ability to maintain upright posture/position. Demonstrates some static and dynamic balance deficits with score of 21/24 DGI and difficulty performing single leg stance for any length of time requiring external support    Personal Factors and Comorbidities Age;Time since onset of injury/illness/exacerbation    Examination-Activity Limitations Squat;Lift    Examination-Participation Restrictions Yard Work;Community Activity    Stability/Clinical  Decision Making Stable/Uncomplicated    Clinical Decision Making Low    Rehab Potential Good    PT Frequency 1x / week    PT Duration 4 weeks    PT Treatment/Interventions Electrical Stimulation;Gait training;Stair training;Functional mobility training;Therapeutic activities;Therapeutic exercise;Balance training;Neuromuscular re-education;Manual techniques;Passive range of motion;Vestibular;Spinal Manipulations;Joint Manipulations    PT Next Visit Plan Continue with T-spine mobilization/stretching, scapular strengthening. LE strengthening with emphasis on incorporating balance/vestibular activities    PT Home Exercise Plan t-spine supine mobilization, ER with retraction             Patient will benefit from skilled therapeutic intervention in order to improve the following deficits and impairments:  Decreased  strength, Hypomobility, Postural dysfunction, Pain  Visit Diagnosis: Pain in thoracic spine  Abnormal posture  Unsteadiness on feet     Problem List Patient Active Problem List   Diagnosis Date Noted   Mild cognitive disorder 03/02/2021   Diarrhea 06/12/2019   GERD (gastroesophageal reflux disease) 11/26/2018   History of colonic polyps 11/26/2018   Asymmetrical sensorineural hearing loss 02/06/2018   BPH (benign prostatic hyperplasia) 07/27/2017   Headache 12/19/2016   Varicose veins of right lower extremity with complications 68/02/8109   Hyperglycemia 04/14/2016   Hyperlipidemia 04/14/2016   Sinus bradycardia 06/10/2015   Asthma with acute exacerbation 11/09/2014   Benign paroxysmal positional vertigo 04/16/2014   LLQ pain 01/15/2014   Hypertension    Eczema    Erectile dysfunction    Paroxysmal SVT (supraventricular tachycardia) ([place]) 11/11/2012   Headache, common migraine, intractable 09/09/2010   Dysphagia 08/04/2010   Allergic rhinitis 08/01/2010   Overweight 12/13/2007   MIXED HEARING LOSS BILATERAL 12/13/2007   CONTACT DERMATITIS&OTH ECZEMA DUE OTH [facility] AGENT 12/13/2007   PSORIASIS 12/13/2007    Toniann Fail, PT 07/05/2021, 3:31 PM  [facility] [address] [place], [place], [address] Phone: [phone]   Fax:  [phone]  Name: Daniel Guerrero MRN: [id number] Date of Birth: Jul 14, 1946

## 2021-07-12 ENCOUNTER — Other Ambulatory Visit: Payer: Self-pay

## 2021-07-12 ENCOUNTER — Ambulatory Visit (HOSPITAL_COMMUNITY): Payer: Medicare Other

## 2021-07-12 DIAGNOSIS — M546 Pain in thoracic spine: Secondary | ICD-10-CM | POA: Diagnosis not present

## 2021-07-12 DIAGNOSIS — R2681 Unsteadiness on feet: Secondary | ICD-10-CM

## 2021-07-12 DIAGNOSIS — R293 Abnormal posture: Secondary | ICD-10-CM

## 2021-07-12 DIAGNOSIS — G8929 Other chronic pain: Secondary | ICD-10-CM | POA: Diagnosis not present

## 2021-07-12 NOTE — Therapy (Signed)
[place], [place], [address] Phone: [phone]   Fax:  [phone]  Physical Therapy Treatment  Patient Details  Name: Daniel Guerrero MRN: [id number] Date of Birth: Jun 09, 1946 Referring Provider (PT): Dorothyann Peng, NP   Encounter Date: 07/12/2021   PT End of Session - 07/12/21 0815     Visit Number 2    Number of Visits 4    Date for PT Re-Evaluation 08/16/21    Authorization Type Medicare Part A and B, medicare guidlines    PT Start Time 0815    PT Stop Time 0900    PT Time Calculation (min) 45 min    Activity Tolerance Patient tolerated treatment well    Behavior During Therapy [facility] for tasks assessed/performed             Past Medical History:  Diagnosis Date   Allergy    BPH (benign prostatic hyperplasia)    Cataract of both eyes    implants both eyes    Eczema    Enlarged aorta (HCC)    Erectile dysfunction    Hearing loss    high frequency   History of inguinal hernia repair, bilateral    Hypertension    Kidney stones    Loss of hearing    high frequency   Meniere disease    Paroxysmal SVT (supraventricular tachycardia) (HCC)    Psoriasis    S/P appendectomy    S/P tonsillectomy    Seasonal allergies    SVT (supraventricular tachycardia) ([place])     Past Surgical History:  Procedure Laterality Date   Ablasion     APPENDECTOMY     CARDIAC ELECTROPHYSIOLOGY MAPPING AND ABLATION  2015   CHOLECYSTECTOMY     COLONOSCOPY     unsure where or when- states greater than 10 yrs ago per pt- was normal per pt and his wife    COLONOSCOPY WITH PROPOFOL N/A 08/26/2018   Procedure: COLONOSCOPY WITH PROPOFOL;  Surgeon: [name], MD;  [facility];  Service: Endoscopy;  Laterality: N/A;  10:00am   CYSTOSCOPY WITH RETROGRADE PYELOGRAM, URETEROSCOPY AND STENT PLACEMENT Left 12/30/2014   Procedure: CYSTOSCOPY WITH LEFT URETEROSCOPY STONE EXTRACTION WITH STENT;  Surgeon: Franchot Gallo, MD;  [facility];  Service: Urology;  Laterality: Left;   ELECTROPHYSIOLOGIC STUDY N/A 08/24/2015   Procedure: SVT Ablation;  Surgeon: Thompson Grayer, MD;  Location: [place] [facility];  Service: Cardiovascular;  Laterality: N/A;   ENDOVENOUS ABLATION SAPHENOUS VEIN W/ LASER Right 12/04/2016   endovenous laser ablation right greater saphenous vein by Tinnie Gens MD    ESOPHAGOGASTRODUODENOSCOPY N/A 08/18/2016   Procedure: ESOPHAGOGASTRODUODENOSCOPY (EGD);  Surgeon: [name], MD;  [facility];  Service: Endoscopy;  Laterality: N/A;   ESOPHAGOGASTRODUODENOSCOPY N/A 09/06/2016   Procedure: ESOPHAGOGASTRODUODENOSCOPY (EGD);  Surgeon: [name], MD;  [facility];  Service: Endoscopy;  Laterality: N/A;  100 - moved to 2/14 @ 2:15 per Tretha Sciara   ESOPHAGOGASTRODUODENOSCOPY (EGD) WITH PROPOFOL N/A 08/26/2018   Procedure: ESOPHAGOGASTRODUODENOSCOPY (EGD) WITH PROPOFOL;  Surgeon: [name], MD;  [facility];  Service: Endoscopy;  Laterality: N/A;   EYE SURGERY     Catracts removed both eye 30 yrs ago   [place]     right and left   HOLMIUM LASER APPLICATION Left 4/0/1027   Procedure: HOLMIUM LASER APPLICATION;  Surgeon: Franchot Gallo, MD;  [facility];  Service: Urology;  Laterality: Left;   MALONEY DILATION N/A 09/06/2016   Procedure: Venia Minks DILATION;  Surgeon: [name], MD;  [facility];  Service: Endoscopy;  Laterality: N/A;   MALONEY DILATION N/A 08/26/2018   Procedure: Venia Minks DILATION;  Surgeon: [name], MD;  [facility];  Service: Endoscopy;  Laterality: N/A;   POLYPECTOMY  08/26/2018   Procedure: POLYPECTOMY;  Surgeon: [name], MD;  [facility];  Service: Endoscopy;;  polyp at descending colon x2   TONSILLECTOMY      There were no vitals filed for this visit.   Subjective Assessment - 07/12/21 0817     Subjective Some pain in upper back with thoracic stretch but  transient. Mostly occurs after staying in the stretch position and then moving from it    Currently in Pain? Yes    Pain Score 2     Pain Location Back    Pain Orientation Mid    Pain Descriptors / Indicators Discomfort    Pain Type Chronic pain                [facility] PT Assessment - 07/12/21 0001       Assessment   Medical Diagnosis Chronic midline thoracic back pain                           [facility] Adult PT Treatment/Exercise - 07/12/21 0001       Exercises   Exercises Lumbar      Lumbar Exercises: Standing   Other Standing Lumbar Exercises single leg stance 10x at counter top      Shoulder Exercises: Supine   Other Supine Exercises supine T-spine mobilization with towel roll and performing dynamic UE movements T's, Y's, W's 3x10 to improve thoracic extension    Other Supine Exercises supine chin retractions with pillow+towel support      Shoulder Exercises: Seated   Other Seated Exercises Chin retractions 10x5 sec                     PT Education - 07/12/21 0859     Education Details discussion on fall risk metrics and benchmark scores for low fall risk    Person(s) Educated Patient    Methods Explanation;Demonstration    Comprehension Verbalized understanding              PT Short Term Goals - 07/05/21 1525       PT SHORT TERM GOAL #1   Title Demonstrate improved balance as evidenced by 24/24 Dynamic Gait Index    Baseline 21/24    Time 2    Period Weeks    Status New    Target Date 07/19/21      PT SHORT TERM GOAL #2   Title Patient will be independent with HEP in order to improve functional outcomes.    Time 2    Period Weeks    Status New    Target Date 07/19/21               PT Long Term Goals - 07/05/21 1526       PT LONG TERM GOAL #1   Title Patient will improve FOTO score by at least 5 points in order to indicate improved tolerance to activity.    Baseline 70% function    Time 4    Period Weeks     Status New    Target Date 08/02/21  PT LONG TERM GOAL #2   Title Demo independence with complex HEP for t-spine posture, LE strength and balance for self-management    Time 4    Period Weeks    Status New    Target Date 08/16/21                   Plan - 07/12/21 0857     Clinical Impression Statement tolerating addition of postural exercises well for thoracic extension and addition of chin retractions to imrpove neutral spine. Single leg balance limited to 2-3 sec unsupported. Will need to add VOR activities and trial different positions, e.g. lunge, partial-squat, etc to facilitate righting reactions/reduce risk for falls. Pt has Meniere's disease which is likely contributing to balance deifcits    Personal Factors and Comorbidities Age;Time since onset of injury/illness/exacerbation    Examination-Activity Limitations Squat;Lift    Examination-Participation Restrictions Yard Work;Community Activity    Stability/Clinical Decision Making Stable/Uncomplicated    Rehab Potential Good    PT Frequency 1x / week    PT Duration 4 weeks    PT Treatment/Interventions Electrical Stimulation;Gait training;Stair training;Functional mobility training;Therapeutic activities;Therapeutic exercise;Balance training;Neuromuscular re-education;Manual techniques;Passive range of motion;Vestibular;Spinal Manipulations;Joint Manipulations    PT Next Visit Plan Continue with T-spine mobilization/stretching, scapular strengthening. LE strengthening with emphasis on incorporating balance/vestibular activities    PT Home Exercise Plan t-spine supine mobilization, ER with retraction             Patient will benefit from skilled therapeutic intervention in order to improve the following deficits and impairments:  Decreased strength, Hypomobility, Postural dysfunction, Pain  Visit Diagnosis: Pain in thoracic spine  Abnormal posture  Unsteadiness on feet     Problem List Patient Active  Problem List   Diagnosis Date Noted   Mild cognitive disorder 03/02/2021   Diarrhea 06/12/2019   GERD (gastroesophageal reflux disease) 11/26/2018   History of colonic polyps 11/26/2018   Asymmetrical sensorineural hearing loss 02/06/2018   BPH (benign prostatic hyperplasia) 07/27/2017   Headache 12/19/2016   Varicose veins of right lower extremity with complications 25/00/3704   Hyperglycemia 04/14/2016   Hyperlipidemia 04/14/2016   Sinus bradycardia 06/10/2015   Asthma with acute exacerbation 11/09/2014   Benign paroxysmal positional vertigo 04/16/2014   LLQ pain 01/15/2014   Hypertension    Eczema    Erectile dysfunction    Paroxysmal SVT (supraventricular tachycardia) ([place]) 11/11/2012   Headache, common migraine, intractable 09/09/2010   Dysphagia 08/04/2010   Allergic rhinitis 08/01/2010   Overweight 12/13/2007   MIXED HEARING LOSS BILATERAL 12/13/2007   CONTACT DERMATITIS&OTH ECZEMA DUE OTH [facility] AGENT 12/13/2007   PSORIASIS 12/13/2007    Toniann Fail, PT 07/12/2021, 8:59 AM  [facility] [address] [place], [place], [address] Phone: [phone]   Fax:  [phone]  Name: KARDELL VIRGIL MRN: [id number] Date of Birth: [birth year]

## 2021-07-12 NOTE — Patient Instructions (Signed)
Access Code: [email or web address] URL: [email or web address] Date: 07/12/2021 Prepared by: Sherlyn Lees  Exercises Seated Cervical Retraction - 1 x daily - 7 x weekly - 5 reps - 3-5 sec hold Seated Passive Cervical Retraction Standing Single Leg Stance with Counter Support - 1 x daily - 7 x weekly - 10 reps - as long as you can! 10 sec is a good goal hold [place] Stretch - 1 x daily - 7 x weekly - 3-5 reps - 60 sec or 10 deep breaths in/out! hold

## 2021-07-27 ENCOUNTER — Ambulatory Visit (HOSPITAL_COMMUNITY): Payer: Medicare Other | Attending: Adult Health

## 2021-07-27 ENCOUNTER — Other Ambulatory Visit: Payer: Self-pay

## 2021-07-27 DIAGNOSIS — M546 Pain in thoracic spine: Secondary | ICD-10-CM | POA: Insufficient documentation

## 2021-07-27 DIAGNOSIS — R293 Abnormal posture: Secondary | ICD-10-CM | POA: Insufficient documentation

## 2021-07-27 DIAGNOSIS — R2681 Unsteadiness on feet: Secondary | ICD-10-CM | POA: Insufficient documentation

## 2021-07-27 NOTE — Therapy (Signed)
[name], [place], [address] Phone: [phone]   Fax:  [phone]  Physical Therapy Treatment  Patient Details  Name: Daniel Guerrero MRN: [id number] Date of Birth: 30-Apr-1946 Referring Provider (PT): Dorothyann Peng, NP   Encounter Date: 07/27/2021   PT End of Session - 07/27/21 1032     Visit Number 3    Number of Visits 4    Date for PT Re-Evaluation 08/16/21    Authorization Type Medicare Part A and B, medicare guidlines    PT Start Time 1032    PT Stop Time 1115    PT Time Calculation (min) 43 min    Activity Tolerance Patient tolerated treatment well    Behavior During Therapy WFL for tasks assessed/performed             Past Medical History:  Diagnosis Date   Allergy    BPH (benign prostatic hyperplasia)    Cataract of both eyes    implants both eyes    Eczema    Enlarged aorta (HCC)    Erectile dysfunction    Hearing loss    high frequency   History of inguinal hernia repair, bilateral    Hypertension    Kidney stones    Loss of hearing    high frequency   Meniere disease    Paroxysmal SVT (supraventricular tachycardia) (HCC)    Psoriasis    S/P appendectomy    S/P tonsillectomy    Seasonal allergies    SVT (supraventricular tachycardia) ([place])     Past Surgical History:  Procedure Laterality Date   Ablasion     APPENDECTOMY     CARDIAC ELECTROPHYSIOLOGY MAPPING AND ABLATION  2015   CHOLECYSTECTOMY     COLONOSCOPY     unsure where or when- states greater than 10 yrs ago per pt- was normal per pt and his wife    COLONOSCOPY WITH PROPOFOL N/A 08/26/2018   Procedure: COLONOSCOPY WITH PROPOFOL;  Surgeon: [name], MD;  [facility];  Service: Endoscopy;  Laterality: N/A;  10:00am   CYSTOSCOPY WITH RETROGRADE PYELOGRAM, URETEROSCOPY AND STENT PLACEMENT Left 12/30/2014   Procedure: CYSTOSCOPY WITH LEFT URETEROSCOPY STONE EXTRACTION WITH STENT;  Surgeon: Franchot Gallo, MD;  [facility];  Service: Urology;  Laterality: Left;   ELECTROPHYSIOLOGIC STUDY N/A 08/24/2015   Procedure: SVT Ablation;  Surgeon: Thompson Grayer, MD;  Location: [facility];  Service: Cardiovascular;  Laterality: N/A;   ENDOVENOUS ABLATION SAPHENOUS VEIN W/ LASER Right 12/04/2016   endovenous laser ablation right greater saphenous vein by Tinnie Gens MD    ESOPHAGOGASTRODUODENOSCOPY N/A 08/18/2016   Procedure: ESOPHAGOGASTRODUODENOSCOPY (EGD);  Surgeon: [name], MD;  [facility];  Service: Endoscopy;  Laterality: N/A;   ESOPHAGOGASTRODUODENOSCOPY N/A 09/06/2016   Procedure: ESOPHAGOGASTRODUODENOSCOPY (EGD);  Surgeon: [name], MD;  [facility];  Service: Endoscopy;  Laterality: N/A;  100 - moved to 2/14 @ 2:15 per Tretha Sciara   ESOPHAGOGASTRODUODENOSCOPY (EGD) WITH PROPOFOL N/A 08/26/2018   Procedure: ESOPHAGOGASTRODUODENOSCOPY (EGD) WITH PROPOFOL;  Surgeon: [name], MD;  [facility];  Service: Endoscopy;  Laterality: N/A;   EYE SURGERY     Catracts removed both eye 30 yrs ago   [place]     right and left   HOLMIUM LASER APPLICATION Left 09/01/5186   Procedure: HOLMIUM LASER APPLICATION;  Surgeon: Franchot Gallo, MD;  [facility];  Service: Urology;  Laterality: Left;   MALONEY DILATION N/A 09/06/2016   Procedure: Venia Minks DILATION;  Surgeon: [name], MD;  [facility];  Service: Endoscopy;  Laterality: N/A;   MALONEY DILATION N/A 08/26/2018   Procedure: Venia Minks DILATION;  Surgeon: [name], MD;  [facility];  Service: Endoscopy;  Laterality: N/A;   POLYPECTOMY  08/26/2018   Procedure: POLYPECTOMY;  Surgeon: [name], MD;  [facility];  Service: Endoscopy;;  polyp at descending colon x2   TONSILLECTOMY      There were no vitals filed for this visit.   Subjective Assessment - 07/27/21 1036     Subjective IMproving posture appreciated and has been  peforming thoracic stretching more frequently    Currently in Pain? No/denies    Pain Score 0-No pain                [facility] PT Assessment - 07/27/21 0001       Assessment   Medical Diagnosis Chronic midline thoracic back pain                           [facility] Adult PT Treatment/Exercise - 07/27/21 0001       Shoulder Exercises: Supine   Other Supine Exercises supine T-spine mobilization with towel roll and performing dynamic UE movements T's, Y's, W's 3x10 to improve thoracic extension    Other Supine Exercises supine chin retractions with pillow+towel support      Shoulder Exercises: Prone   Extension Strengthening;Both;10 reps    Other Prone Exercises I's,W's, Y's 2x10      Shoulder Exercises: Standing   Protraction Strengthening;Both;20 reps;Theraband    Theraband Level (Shoulder Protraction) Level 1 (Yellow)   forearms planted on wall, ER "wall crawl"                      PT Short Term Goals - 07/05/21 1525       PT SHORT TERM GOAL #1   Title Demonstrate improved balance as evidenced by 24/24 Dynamic Gait Index    Baseline 21/24    Time 2    Period Weeks    Status New    Target Date 07/19/21      PT SHORT TERM GOAL #2   Title Patient will be independent with HEP in order to improve functional outcomes.    Time 2    Period Weeks    Status New    Target Date 07/19/21               PT Long Term Goals - 07/05/21 1526       PT LONG TERM GOAL #1   Title Patient will improve FOTO score by at least 5 points in order to indicate improved tolerance to activity.    Baseline 70% function    Time 4    Period Weeks    Status New    Target Date 08/02/21      PT LONG TERM GOAL #2   Title Demo independence with complex HEP for t-spine posture, LE strength and balance for self-management    Time 4    Period Weeks    Status New    Target Date 08/16/21                   Plan - 07/27/21 1219     Clinical Impression  Statement Tolerating HEP very well and notes improved upright  posture and reports less fatigue with ther ex for postural strength activities.  Able to progress to increased PRE for postural and shoulder girdle strength to improve thoracic extension and shoulder retraction. Continued session x 1 visit for HEP refinement and transition to independent HEP    Personal Factors and Comorbidities Age;Time since onset of injury/illness/exacerbation    Examination-Activity Limitations Squat;Lift    Examination-Participation Restrictions Yard Work;Community Activity    Stability/Clinical Decision Making Stable/Uncomplicated    Rehab Potential Good    PT Frequency 1x / week    PT Duration 4 weeks    PT Treatment/Interventions Electrical Stimulation;Gait training;Stair training;Functional mobility training;Therapeutic activities;Therapeutic exercise;Balance training;Neuromuscular re-education;Manual techniques;Passive range of motion;Vestibular;Spinal Manipulations;Joint Manipulations    PT Next Visit Plan Continue with T-spine mobilization/stretching, scapular strengthening. LE strengthening with emphasis on incorporating balance/vestibular activities    PT Home Exercise Plan t-spine supine mobilization, ER with retraction             Patient will benefit from skilled therapeutic intervention in order to improve the following deficits and impairments:  Decreased strength, Hypomobility, Postural dysfunction, Pain  Visit Diagnosis: Pain in thoracic spine  Abnormal posture  Unsteadiness on feet     Problem List Patient Active Problem List   Diagnosis Date Noted   Mild cognitive disorder 03/02/2021   Diarrhea 06/12/2019   GERD (gastroesophageal reflux disease) 11/26/2018   History of colonic polyps 11/26/2018   Asymmetrical sensorineural hearing loss 02/06/2018   BPH (benign prostatic hyperplasia) 07/27/2017   Headache 12/19/2016   Varicose veins of right lower extremity with complications  97/53/0051   Hyperglycemia 04/14/2016   Hyperlipidemia 04/14/2016   Sinus bradycardia 06/10/2015   Asthma with acute exacerbation 11/09/2014   Benign paroxysmal positional vertigo 04/16/2014   LLQ pain 01/15/2014   Hypertension    Eczema    Erectile dysfunction    Paroxysmal SVT (supraventricular tachycardia) ([facility]) 11/11/2012   Headache, common migraine, intractable 09/09/2010   Dysphagia 08/04/2010   Allergic rhinitis 08/01/2010   Overweight 12/13/2007   MIXED HEARING LOSS BILATERAL 12/13/2007   CONTACT DERMATITIS&OTH ECZEMA DUE OTH [facility] AGENT 12/13/2007   PSORIASIS 12/13/2007    Toniann Fail, PT 07/27/2021, 12:21 PM  [facility] [address] [place], [place], [address] Phone: [phone]   Fax:  [phone]  Name: GLENDA KUNST MRN: [id number] Date of Birth: Sep 19, 1945

## 2021-08-09 ENCOUNTER — Ambulatory Visit (HOSPITAL_COMMUNITY): Payer: Medicare Other

## 2021-08-09 ENCOUNTER — Other Ambulatory Visit: Payer: Self-pay

## 2021-08-09 DIAGNOSIS — R2681 Unsteadiness on feet: Secondary | ICD-10-CM

## 2021-08-09 DIAGNOSIS — M546 Pain in thoracic spine: Secondary | ICD-10-CM | POA: Diagnosis not present

## 2021-08-09 DIAGNOSIS — R293 Abnormal posture: Secondary | ICD-10-CM | POA: Diagnosis not present

## 2021-08-09 NOTE — Therapy (Signed)
[facility] [address] [place], [place], [address] Phone: [phone]   Fax:  [phone]  Physical Therapy Treatment and D/C Summary  Patient Details  Name: Daniel Guerrero MRN: [id number] Date of Birth: 04-30-1946 Referring Provider (PT): Nafziger, [name], NP  PHYSICAL THERAPY DISCHARGE SUMMARY  Visits from Start of Care: 4  Current functional level related to goals / functional outcomes: Met STG and progressed with dynamic balance. No pain-limited symptoms at this time   Remaining deficits: none   Education / Equipment: HEP   Patient agrees to discharge. Patient goals were partially met. Patient is being discharged due to being pleased with the current functional level.  Encounter Date: 08/09/2021   PT End of Session - 08/09/21 1258     Visit Number 4    Number of Visits 4    Date for PT Re-Evaluation 08/16/21    Authorization Type Medicare Part A and B, medicare guidlines    PT Start Time 1300    PT Stop Time 1330   D/C assessment and visit   PT Time Calculation (min) 30 min    Activity Tolerance Patient tolerated treatment well    Behavior During Therapy WFL for tasks assessed/performed             Past Medical History:  Diagnosis Date   Allergy    BPH (benign prostatic hyperplasia)    Cataract of both eyes    implants both eyes    Eczema    Enlarged aorta (HCC)    Erectile dysfunction    Hearing loss    high frequency   History of inguinal hernia repair, bilateral    Hypertension    Kidney stones    Loss of hearing    high frequency   Meniere disease    Paroxysmal SVT (supraventricular tachycardia) (HCC)    Psoriasis    S/P appendectomy    S/P tonsillectomy    Seasonal allergies    SVT (supraventricular tachycardia) (HCC)     Past Surgical History:  Procedure Laterality Date   Ablasion     APPENDECTOMY     CARDIAC ELECTROPHYSIOLOGY MAPPING AND ABLATION  2015   CHOLECYSTECTOMY     COLONOSCOPY      unsure where or when- states greater than 10 yrs ago per pt- was normal per pt and his wife    COLONOSCOPY WITH PROPOFOL N/A 08/26/2018   Procedure: COLONOSCOPY WITH PROPOFOL;  Surgeon: [name], MD;  [facility];  Service: Endoscopy;  Laterality: N/A;  10:00am   CYSTOSCOPY WITH RETROGRADE PYELOGRAM, URETEROSCOPY AND STENT PLACEMENT Left 12/30/2014   Procedure: CYSTOSCOPY WITH LEFT URETEROSCOPY STONE EXTRACTION WITH STENT;  Surgeon: Franchot Gallo, MD;  [facility];  Service: Urology;  Laterality: Left;   ELECTROPHYSIOLOGIC STUDY N/A 08/24/2015   Procedure: SVT Ablation;  Surgeon: Thompson Grayer, MD;  Location: [facility];  Service: Cardiovascular;  Laterality: N/A;   ENDOVENOUS ABLATION SAPHENOUS VEIN W/ LASER Right 12/04/2016   endovenous laser ablation right greater saphenous vein by Tinnie Gens MD    ESOPHAGOGASTRODUODENOSCOPY N/A 08/18/2016   Procedure: ESOPHAGOGASTRODUODENOSCOPY (EGD);  Surgeon: [name], MD;  [facility];  Service: Endoscopy;  Laterality: N/A;   ESOPHAGOGASTRODUODENOSCOPY N/A 09/06/2016   Procedure: ESOPHAGOGASTRODUODENOSCOPY (EGD);  Surgeon: [name], MD;  [facility];  Service: Endoscopy;  Laterality: N/A;  100 - moved to 2/14 @ 2:15 per Tretha Sciara   ESOPHAGOGASTRODUODENOSCOPY (EGD) WITH PROPOFOL N/A  08/26/2018   Procedure: ESOPHAGOGASTRODUODENOSCOPY (EGD) WITH PROPOFOL;  Surgeon: [name], MD;  [facility];  Service: Endoscopy;  Laterality: N/A;   EYE SURGERY     Catracts removed both eye 30 yrs ago   [place]     right and left   HOLMIUM LASER APPLICATION Left 03/01/3733   Procedure: HOLMIUM LASER APPLICATION;  Surgeon: Franchot Gallo, MD;  [facility];  Service: Urology;  Laterality: Left;   MALONEY DILATION N/A 09/06/2016   Procedure: Venia Minks DILATION;  Surgeon: [name], MD;  [facility];  Service: Endoscopy;  Laterality: N/A;   MALONEY DILATION N/A  08/26/2018   Procedure: Venia Minks DILATION;  Surgeon: [name], MD;  [facility];  Service: Endoscopy;  Laterality: N/A;   POLYPECTOMY  08/26/2018   Procedure: POLYPECTOMY;  Surgeon: [name], MD;  [facility];  Service: Endoscopy;;  polyp at descending colon x2   TONSILLECTOMY      There were no vitals filed for this visit.   Subjective Assessment - 08/09/21 1303     Subjective No new symptoms to report    Currently in Pain? No/denies    Pain Score 0-No pain                [facility] PT Assessment - 08/09/21 0001       Assessment   Medical Diagnosis Chronic midline thoracic back pain      Observation/Other Assessments   Focus on Therapeutic Outcomes (FOTO)  67% function                           [facility] Adult PT Treatment/Exercise - 08/09/21 0001       Shoulder Exercises: Supine   Other Supine Exercises supine T-spine mobilization with towel roll and performing dynamic UE movements T's, Y's, W's 3x10 to improve thoracic extension    Other Supine Exercises supine chin retractions with pillow+towel support 3x10      Shoulder Exercises: Prone   Other Prone Exercises I's,W's, Y's 2x10                       PT Short Term Goals - 08/09/21 1310       PT SHORT TERM GOAL #1   Title Demonstrate improved balance as evidenced by 24/24 Dynamic Gait Index    Baseline 21/24 at [facility]. 24/24 DGI at end of care    Time 2    Period Weeks    Status Achieved    Target Date 07/19/21      PT SHORT TERM GOAL #2   Title Patient will be independent with HEP in order to improve functional outcomes.    Time 2    Period Weeks    Status Achieved    Target Date 07/19/21               PT Long Term Goals - 08/09/21 1339       PT LONG TERM GOAL #1   Title Patient will improve FOTO score by at least 5 points in order to indicate improved tolerance to activity.    Baseline 70% function at start of care. 67% function at end of  care    Time 4    Period Weeks    Status Not Met    Target Date 08/02/21      PT LONG TERM GOAL #2   Title Demo  independence with complex HEP for t-spine posture, LE strength and balance for self-management    Time 4    Period Weeks    Status Achieved    Target Date 08/16/21                   Plan - 08/09/21 1335     Clinical Impression Statement Pt able to meet STG/LTG and reports overall improvement despite lower score on FOTO assessment.  Demonstrates marked postural improvements with decrease in forward head posturing and reports no spinal pain at this time and chief limitation being fatigue in his back vs overt pain.  Demo independence in HEP for mobility and strengthening and has been provided written/visual instruction and resistance bands    Personal Factors and Comorbidities Age;Time since onset of injury/illness/exacerbation    Examination-Activity Limitations Squat;Lift    Examination-Participation Restrictions Yard Work;Community Activity    Stability/Clinical Decision Making Stable/Uncomplicated    Rehab Potential Good    PT Frequency 1x / week    PT Duration 4 weeks    PT Treatment/Interventions Electrical Stimulation;Gait training;Stair training;Functional mobility training;Therapeutic activities;Therapeutic exercise;Balance training;Neuromuscular re-education;Manual techniques;Passive range of motion;Vestibular;Spinal Manipulations;Joint Manipulations    PT Next Visit Plan Continue with T-spine mobilization/stretching, scapular strengthening. LE strengthening with emphasis on incorporating balance/vestibular activities    PT Home Exercise Plan t-spine supine mobilization, ER with retraction             Patient will benefit from skilled therapeutic intervention in order to improve the following deficits and impairments:  Decreased strength, Hypomobility, Postural dysfunction, Pain  Visit Diagnosis: Pain in thoracic spine  Abnormal  posture  Unsteadiness on feet     Problem List Patient Active Problem List   Diagnosis Date Noted   Mild cognitive disorder 03/02/2021   Diarrhea 06/12/2019   GERD (gastroesophageal reflux disease) 11/26/2018   History of colonic polyps 11/26/2018   Asymmetrical sensorineural hearing loss 02/06/2018   BPH (benign prostatic hyperplasia) 07/27/2017   Headache 12/19/2016   Varicose veins of right lower extremity with complications 78/67/6720   Hyperglycemia 04/14/2016   Hyperlipidemia 04/14/2016   Sinus bradycardia 06/10/2015   Asthma with acute exacerbation 11/09/2014   Benign paroxysmal positional vertigo 04/16/2014   LLQ pain 01/15/2014   Hypertension    Eczema    Erectile dysfunction    Paroxysmal SVT (supraventricular tachycardia) ([place]) 11/11/2012   Headache, common migraine, intractable 09/09/2010   Dysphagia 08/04/2010   Allergic rhinitis 08/01/2010   Overweight 12/13/2007   MIXED HEARING LOSS BILATERAL 12/13/2007   CONTACT DERMATITIS&OTH ECZEMA DUE OTH [facility] AGENT 12/13/2007   PSORIASIS 12/13/2007    Toniann Fail, PT 08/09/2021, 1:40 PM  [facility] [address] [place], [place], [address] Phone: [phone]   Fax:  [phone]  Name: SNYDER COLAVITO MRN: [id number] Date of Birth: 08-08-1945

## 2021-08-16 ENCOUNTER — Ambulatory Visit (HOSPITAL_COMMUNITY): Payer: Medicare Other

## 2021-08-31 ENCOUNTER — Ambulatory Visit: Payer: Medicare Other

## 2021-09-05 ENCOUNTER — Ambulatory Visit (INDEPENDENT_AMBULATORY_CARE_PROVIDER_SITE_OTHER): Payer: Medicare Other | Admitting: Physician Assistant

## 2021-09-05 ENCOUNTER — Other Ambulatory Visit: Payer: Self-pay

## 2021-09-05 ENCOUNTER — Encounter: Payer: Self-pay | Admitting: Physician Assistant

## 2021-09-05 VITALS — BP 151/84 | HR 69 | Resp 18 | Wt 200.0 lb

## 2021-09-05 DIAGNOSIS — R413 Other amnesia: Secondary | ICD-10-CM | POA: Diagnosis not present

## 2021-09-05 DIAGNOSIS — F09 Unspecified mental disorder due to known physiological condition: Secondary | ICD-10-CM

## 2021-09-05 MED ORDER — MEMANTINE HCL 10 MG PO TABS: ORAL_TABLET | ORAL | 11 refills | Status: DC

## 2021-09-05 NOTE — Patient Instructions (Addendum)
It was a pleasure to see you today at our office.   Recommendations: Continue 10 mg  1 tablet  twice daily.  Monitor for side effects  Call with any questions or concerns.  Follow up in 6 months Neurocognitive testing    RECOMMENDATIONS FOR ALL PATIENTS WITH MEMORY PROBLEMS: 1. Continue to exercise (Recommend 30 minutes of walking everyday, or 3 hours every week) 2. Increase social interactions - continue going to [place] and enjoy social gatherings with friends and family 3. Eat healthy, avoid fried foods and eat more fruits and vegetables 4. Maintain adequate blood pressure, blood sugar, and blood cholesterol level. Reducing the risk of stroke and cardiovascular disease also helps promoting better memory. 5. Avoid stressful situations. Live a simple life and avoid aggravations. Organize your time and prepare for the next day in anticipation. 6. Sleep well, avoid any interruptions of sleep and avoid any distractions in the bedroom that may interfere with adequate sleep quality 7. Avoid sugar, avoid sweets as there is a strong link between excessive sugar intake, diabetes, and cognitive impairment We discussed the Mediterranean diet, which has been shown to help patients reduce the risk of progressive memory disorders and reduces cardiovascular risk. This includes eating fish, eat fruits and green leafy vegetables, nuts like almonds and hazelnuts, walnuts, and also use olive oil. Avoid fast foods and fried foods as much as possible. Avoid sweets and sugar as sugar use has been linked to worsening of memory function.  There is always a concern of gradual progression of memory problems. If this is the case, then we may need to adjust level of care according to patient needs. Support, both to the patient and caregiver, should then be put into place.     FALL PRECAUTIONS: Be cautious when walking. Scan the area for obstacles that may increase the risk of trips and falls. When getting up in the  mornings, sit up at the edge of the bed for a few minutes before getting out of bed. Consider elevating the bed at the head end to avoid drop of blood pressure when getting up. Walk always in a well-lit room (use night lights in the walls). Avoid area rugs or power cords from appliances in the middle of the walkways. Use a walker or a cane if necessary and consider physical therapy for balance exercise. Get your eyesight checked regularly.  FINANCIAL OVERSIGHT: Supervision, especially oversight when making financial decisions or transactions is also recommended.  HOME SAFETY: Consider the safety of the kitchen when operating appliances like stoves, microwave oven, and blender. Consider having supervision and share cooking responsibilities until no longer able to participate in those. Accidents with firearms and other hazards in the house should be identified and addressed as well.   ABILITY TO BE LEFT ALONE: If patient is unable to contact 911 operator, consider using LifeLine, or when the need is there, arrange for someone to stay with patients. Smoking is a fire hazard, consider supervision or cessation. Risk of wandering should be assessed by caregiver and if detected at any point, supervision and safe proof recommendations should be instituted.  MEDICATION SUPERVISION: Inability to self-administer medication needs to be constantly addressed. Implement a mechanism to ensure safe administration of the medications.   DRIVING: Regarding driving, in patients with progressive memory problems, driving will be impaired. We advise to have someone else do the driving if trouble finding directions or if minor accidents are reported. Independent driving assessment is available to determine safety of driving.  If you are interested in the driving assessment, you can contact the following:  [employer] in [place]  [place] [phone] or [phone]    [place] refers to food and lifestyle choices that are based on the traditions of countries located on the [employer]. This way of eating has been shown to help prevent certain conditions and improve outcomes for people who have chronic diseases, like kidney disease and heart disease. What are tips for following this plan? Lifestyle  Cook and eat meals together with your family, when possible. Drink enough fluid to keep your urine clear or pale yellow. Be physically active every day. This includes: Aerobic exercise like running or swimming. Leisure activities like gardening, walking, or housework. Get 7-8 hours of sleep each night. If recommended by your health care provider, drink red wine in moderation. This means 1 glass a day for nonpregnant women and 2 glasses a day for men. A glass of wine equals 5 oz (150 mL). Reading food labels  Check the serving size of packaged foods. For foods such as rice and pasta, the serving size refers to the amount of cooked product, not dry. Check the total fat in packaged foods. Avoid foods that have saturated fat or trans fats. Check the ingredients list for added sugars, such as corn syrup. Shopping  At the grocery store, buy most of your food from the areas near the walls of the store. This includes: Fresh fruits and vegetables (produce). Grains, beans, nuts, and seeds. Some of these may be available in unpackaged forms or large amounts (in bulk). Fresh seafood. Poultry and eggs. Low-fat dairy products. Buy whole ingredients instead of prepackaged foods. Buy fresh fruits and vegetables in-season from local farmers markets. Buy frozen fruits and vegetables in resealable bags. If you do not have access to quality fresh seafood, buy precooked frozen shrimp or canned fish, such as tuna, salmon, or sardines. Buy small amounts of raw or cooked  vegetables, salads, or olives from the deli or salad bar at your store. Stock your pantry so you always have certain foods on hand, such as olive oil, canned tuna, canned tomatoes, rice, pasta, and beans. Cooking  Cook foods with extra-virgin olive oil instead of using butter or other vegetable oils. Have meat as a side dish, and have vegetables or grains as your main dish. This means having meat in small portions or adding small amounts of meat to foods like pasta or stew. Use beans or vegetables instead of meat in common dishes like chili or lasagna. Experiment with different cooking methods. Try roasting or broiling vegetables instead of steaming or sauteing them. Add frozen vegetables to soups, stews, pasta, or rice. Add nuts or seeds for added healthy fat at each meal. You can add these to yogurt, salads, or vegetable dishes. Marinate fish or vegetables using olive oil, lemon juice, garlic, and fresh herbs. Meal planning  Plan to eat 1 vegetarian meal one day each week. Try to work up to 2 vegetarian meals, if possible. Eat seafood 2 or more times a week. Have healthy snacks readily available, such as: Vegetable sticks with hummus. Greek yogurt. Fruit and nut trail mix. Eat balanced meals throughout the week. This includes: Fruit: 2-3 servings a day Vegetables: 4-5 servings a day Low-fat dairy: 2 servings a day Fish, poultry, or lean meat: 1 serving a day Beans and legumes: 2 or more  servings a week Nuts and seeds: 1-2 servings a day Whole grains: 6-8 servings a day Extra-virgin olive oil: 3-4 servings a day Limit red meat and sweets to only a few servings a month What are my food choices? Mediterranean diet Recommended Grains: Whole-grain pasta. Brown rice. Bulgar wheat. Polenta. Couscous. Whole-wheat bread. [place]. Vegetables: Artichokes. Beets. Broccoli. Cabbage. Carrots. Eggplant. Green beans. Chard. Kale. Spinach. Onions. Leeks. Peas. Squash. Tomatoes. Peppers.  Radishes. Fruits: Apples. Apricots. Avocado. Berries. Bananas. Cherries. Dates. Figs. Grapes. Lemons. Melon. Oranges. Peaches. Plums. Pomegranate. Meats and other protein foods: Beans. Almonds. Sunflower seeds. Pine nuts. Peanuts. [place]. Salmon. Scallops. Shrimp. [place]. Tilapia. Clams. Oysters. Eggs. Dairy: Low-fat milk. Cheese. Greek yogurt. Beverages: Water. Red wine. Herbal tea. Fats and oils: Extra virgin olive oil. Avocado oil. Grape seed oil. Sweets and desserts: [place] yogurt with honey. Baked apples. Poached pears. Trail mix. Seasoning and other foods: Basil. Cilantro. Coriander. Cumin. Mint. Parsley. Sage. Rosemary. Tarragon. Garlic. Oregano. Thyme. Pepper. Balsalmic vinegar. Tahini. Hummus. Tomato sauce. Olives. Mushrooms. Limit these Grains: Prepackaged pasta or rice dishes. Prepackaged cereal with added sugar. Vegetables: Deep fried potatoes (french fries). Fruits: Fruit canned in syrup. Meats and other protein foods: Beef. Pork. Lamb. Poultry with skin. Hot dogs. [place] Salines. Dairy: Ice cream. Sour cream. Whole milk. Beverages: Juice. Sugar-sweetened soft drinks. Beer. Liquor and spirits. Fats and oils: Butter. Canola oil. Vegetable oil. Beef fat (tallow). Lard. Sweets and desserts: Cookies. Cakes. Pies. Candy. Seasoning and other foods: Mayonnaise. Premade sauces and marinades. The items listed may not be a complete list. Talk with your dietitian about what dietary choices are right for you. Summary The Mediterranean diet includes both food and lifestyle choices. Eat a variety of fresh fruits and vegetables, beans, nuts, seeds, and whole grains. Limit the amount of red meat and sweets that you eat. Talk with your health care provider about whether it is safe for you to drink red wine in moderation. This means 1 glass a day for nonpregnant women and 2 glasses a day for men. A glass of wine equals 5 oz (150 mL). This information is not intended to replace advice given to you by your health  care provider. Make sure you discuss any questions you have with your health care provider. Document Released: 03/02/2016 Document Revised: 04/04/2016 Document Reviewed: 03/02/2016 Elsevier Interactive Patient Education  2017 [employer].

## 2021-09-05 NOTE — Progress Notes (Signed)
Assessment/Plan:    Mild Neurocognitive Disorder  Patient has been missing memantine evening dose, presenting today with wife reporting that his memory may be "slightly worse ".  In today's visit, he is conversant, able to follow commands, and appears to be stable from the cognitive standpoint.  Exam was unremarkable.   Recommendations:  Discussed safety both in and out of the home.  Discussed the importance of regular daily schedule with inclusion of crossword puzzles to maintain brain function.  Continue to monitor mood by PCP Resume memantine 10 mg to twice daily as prescribe for full therapeutic effect. Referral to neuropsychology for neurocognitive testing, clarity of diagnosis, and to evaluate trajectory of cognitive disease. Recommend discontinuation of alcohol. Stay active at least 30 minutes at least 3 times a week.  Naps should be scheduled and should be no longer than 60 minutes and should not occur after 2 PM.  Mediterranean diet is recommended  Control cardiovascular risk factors  Follow up in 6 months.   Case discussed with Dr. 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  He was last seen on 03/02/2021, at which time his MoCA was 20/30. This patient is accompanied in the office by his wife who supplements the history.  Previous records as well as any outside records available were reviewed prior to todays visit.  He is supposed to be on memantine 10 mg twice daily, but has reported missing many evening doses.  He states that he is "memory is not that good ".  He may be more forgetful with short-term memory according to his wife.  He may ask the question again, or tell  the same story. "I might be losing his sense of time, I had to keep a calendar ".  His mood has been good, without depression or irritability.  He enjoys working in the yard, walking with his wife, using the stationary bicycle.  He sleeps well, but has to wake up several times in the night to go urinate.  Occasionally he has vivid dreams, but no REM behavior, or sleepwalking.  He denies hallucinations, paranoia or leaving objects in unusual places.  He is independent of bathing and dressing.  He is wife manages the finances as she is an Optometrist, and has done so "forever ".  His appetite is good, "because of the gallbladder that I have intermittent diarrhea ".  He denies any trouble swallowing.  He never cooks, "my wife has always done so ".  He ambulates without difficulty without the use of a walker or a cane.  He is doing physical therapy to improve posture.  He denies any falls or head injuries.  He drives and uses his GPS, without getting lost  -he depends so much of his GPS, but at times he may feel lost without it.  He denies any recent headaches -He has a history of migraines, but they are not life limiting with a frequency of about once a month.  He denies double vision, dizziness, vertigo, focal numbness or tingling, unilateral weakness or tremors, denies anosmia, never had COVID "had 6 vaccines already ".  He drinks 2 tall beers a day      Initial visit 03/02/21  the patient is seen in neurologic consultation at the request of Dorothyann Peng, NP for the evaluation of memory.  The patient is accompanied by wife Daniel Guerrero who supplements the history. 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  He states that recalling names and numbers became more difficult, even numbers that he had dialed frequently.  "I have been always bad with  dates, so I was not too worried when that all I could not remember "    His mood has been good, without depression or irritability.  He likes to enjoy his retirement, working on his yard, walking with his wife, and using his stationary bicycle.  He still would like to go back to teaching if he could.  He falls asleep well at night, but because of frequent urination he wakes up about 3 times to go to the bathroom, which interferes with his sleep.  He has begun taking Flomax which seems to help somehow.  He denies any vivid dreams or sleepwalking.  He denies hallucinations, paranoia, or leaving objects in unusual places.   He is independent of bathing and dressing.  Occasionally, he forgets to take a dose of medication.  He tries to set then in front of the breakfast table in a pillbox and his wife monitors closely.  She also manages the finances, she is an Optometrist, and has been doing so "forever ".  His appetite is good, but when he was having worse diarrhea, had lost a significant amount of weight, using this opportunity to  eat better and stay within the 180 to 190 pound range.  He denies trouble swallowing.  He never cooks, his wife always did the cooking.   He ambulates without difficulty without the use of a walker or a cane.  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  He drives and uses his GPS, without getting lost  -he depends so much of his GPS, but at times he may feel lost without it.  He denies any recent headaches -He has a history of migraines, but they are not life limiting with a frequency of about once a month.  He denies any falls, head injuries, double vision, dizziness, vertigo, focal numbness or tingling, unilateral weakness or tremors, denies anosmia, never had COVID.  He denies a history of sleep apnea, alcohol or tobacco abuse.  Family history negative for dementia.     Labs   02/01/21   TSH nl 1.42 B12  nl 539 UA neg CBC normal      MRI brain 02/16/21  No evidence of acute intracranial abnormality.  Mild chronic small vessel ischemic changes within the cerebral white matter and pons, similar as compared to the brain MRI of 01/25/2018. 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  Mild generalized cerebral and cerebellar atrophy.  Mild bilateral ethmoid and maxillary sinus mucosal thickening  PREVIOUS MEDICATIONS:   CURRENT MEDICATIONS:  Outpatient Encounter Medications as of 09/05/2021  Medication Sig   amLODipine (NORVASC) 5 MG tablet TAKE 1 TABLET BY MOUTH EVERY DAY   cetirizine (ZYRTEC) 10 MG tablet Take 10 mg by mouth daily as  needed for allergies.    CRANBERRY POWDER PO Take by mouth. 15,000 UNITS as needed   Cyanocobalamin (VITAMIN B-12 PO) Take 1 tablet by mouth daily.    desonide (DESOWEN) 0.05 % cream Apply 1 application topically daily as needed (rash).    diphenhydrAMINE HCl (BENADRYL ALLERGY PO) Take 1 tablet by mouth daily.   dutasteride (AVODART) 0.5 MG capsule Take 1 capsule (0.5 mg total) by mouth daily.   EPINEPHrine 0.3 mg/0.3 mL IJ SOAJ injection Inject 0.3 mLs (0.3 mg total) into the muscle as needed for anaphylaxis.   fluticasone (FLONASE) 50 MCG/ACT nasal spray INSTILL TWO SPRAYS IN EACH NOSTRIL DAILY AS NEEDED FOR ALLERGIES   hydrocortisone cream 1 % Apply 1 application topically daily as needed for itching.   lansoprazole (PREVACID) 30 MG capsule Take 1 capsule (30 mg total) by mouth daily before breakfast.   loperamide (IMODIUM) 2 MG capsule Take by mouth as needed for diarrhea or loose stools. Patient takes every 2-3 days.   memantine (NAMENDA) 10 MG tablet Take 1 tablet (5 mg at night) for 2 weeks, then increase to 1 tablet (5 mg) twice a day   Multiple Vitamin (ONE DAILY) tablet Take by mouth.   Simethicone (GAS-X PO)  Take 1 tablet by mouth daily as needed.    tamsulosin (FLOMAX) 0.4 MG CAPS capsule Take 2 capsules (0.8 mg total) by mouth daily.   vitamin C (ASCORBIC ACID) 500 MG tablet Take 500 mg by mouth daily.   No facility-administered encounter medications on file as of 09/05/2021.     Objective:     PHYSICAL EXAMINATION:    VITALS:   Vitals:   09/05/21 1039  BP: (!) 151/84  Pulse: 69  Resp: 18  SpO2: 95%  Weight: 200 lb (90.7 kg)  HC: 69" (175.3 cm)    GEN:  The patient appears stated age and is in NAD. HEENT:  Normocephalic, atraumatic.   Neurological examination:  General: NAD, well-groomed, appears stated age. Orientation: The patient is alert. Oriented to person, place and date Cranial nerves: There is good facial symmetry. The speech is fluent and clear. No aphasia or dysarthria. Fund of knowledge is appropriate. Recent memory impaired, remote memory intact, Attention and concentration are normal.  Able to name objects and repeat phrases.  Hearing is intact to conversational tone.    Sensation: Sensation is intact to light touch throughout Motor: Strength is at least antigravity x4. Tremors: none  DTR's 2/4 in [facility] Cognitive Assessment  03/02/2021  Visuospatial/ Executive (0/5) 4  Naming (0/3) 2  Attention: Read list of digits (0/2) 2  Attention: Read list of letters (0/1) 1  Attention: Serial 7 subtraction starting at 100 (0/3) 3  Language: Repeat phrase (0/2) 2  Language : Fluency (0/1) 1  Abstraction (0/2) 1  Delayed Recall (0/5) 0  Orientation (0/6) 4  Total 20  Adjusted Score (based on education) 20   MMSE - Mini Mental State Exam 02/01/2021 07/27/2017  Not completed: - (No Data)  Orientation to time 5 -  Orientation to Place 5 -  Registration 3 -  Attention/ Calculation 4 -  Recall 1 -  Language- name 2 objects 2 -  Language- repeat 1 -  Language- follow 3 step command 3 -  Language- read & follow direction 1 -  Write a sentence 1 -  Copy  design 1 -  Total score 27 -    No flowsheet data found.  Movement examination: Tone: There is normal tone in the UE/LE Abnormal movements:  no tremor.  No myoclonus.  No asterixis.   Coordination:  There is no decremation with RAM's. Normal finger to nose  Gait and Station: The patient has no difficulty arising out of a deep-seated chair without the use of the hands. The patient's stride length is good.  Gait is cautious and narrow.    Total time spent on today's visit was 40  minutes, including both face-to-face time and nonface-to-face time. Time included that spent on review of records (prior notes available to me/labs/imaging if pertinent), discussing treatment and goals, answering patient's questions and coordinating care.  Cc:  Dorothyann Peng, NP Sharene Butters, PA-C

## 2021-09-20 ENCOUNTER — Ambulatory Visit (INDEPENDENT_AMBULATORY_CARE_PROVIDER_SITE_OTHER): Payer: Medicare Other

## 2021-09-20 VITALS — Ht 70.0 in | Wt 190.0 lb

## 2021-09-20 DIAGNOSIS — Z0000 Encounter for general adult medical examination without abnormal findings: Secondary | ICD-10-CM

## 2021-09-20 NOTE — Patient Instructions (Addendum)
[name] , Thank you for taking time to come for your Medicare Wellness Visit. I appreciate your ongoing commitment to your health goals. Please review the following plan we discussed and let me know if I can assist you in the future.   These are the goals we discussed:  Goals       Patient Stated (pt-stated)      To continue walking this year Walks outdoors Baseline; try to increase up to 30 minutes 5 days a week.        Patient Stated      Keep doing what I love-teach!       Patient Stated      Stay healthy        This is a list of the screening recommended for you and due dates:  Health Maintenance  Topic Date Due   COVID-19 Vaccine (4 - Booster for Pfizer series) 05/15/2020   Colon Cancer Screening  08/26/2021   Tetanus Vaccine  09/29/2027   Pneumonia Vaccine  Completed   Flu Shot  Completed   Hepatitis C Screening: USPSTF Recommendation to screen - Ages 18-79 yo.  Completed   Zoster (Shingles) Vaccine  Completed   HPV Vaccine  Aged Out   Complete foot exam   Discontinued   Hemoglobin A1C  Discontinued   Eye exam for diabetics  Discontinued   Urine Protein Check  Discontinued    Advanced directives: Yes Copy on file  Conditions/risks identified: None  Next appointment: Follow up in one year for your annual wellness visit    Preventive Care 65 Years and Older, Male Preventive care refers to lifestyle choices and visits with your health care provider that can promote health and wellness. What does preventive care include? A yearly physical exam. This is also called an annual well check. Dental exams once or twice a year. Routine eye exams. Ask your health care provider how often you should have your eyes checked. Personal lifestyle choices, including: Daily care of your teeth and gums. Regular physical activity. Eating a healthy diet. Avoiding tobacco and drug use. Limiting alcohol use. Practicing safe sex. Taking low-dose aspirin every day. Taking  vitamin and mineral supplements as recommended by your health care provider. What happens during an annual well check? The services and screenings done by your health care provider during your annual well check will depend on your age, overall health, lifestyle risk factors, and family history of disease. Counseling  Your health care provider may ask you questions about your: Alcohol use. Tobacco use. Drug use. Emotional well-being. Home and relationship well-being. Sexual activity. Eating habits. History of falls. Memory and ability to understand (cognition). Work and work Statistician. Reproductive health. Screening  You may have the following tests or measurements: Height, weight, and BMI. Blood pressure. Lipid and cholesterol levels. These may be checked every 5 years, or more frequently if you are over 55 years old. Skin check. Lung cancer screening. You may have this screening every year starting at age 67 if you have a 30-pack-year history of smoking and currently smoke or have quit within the past 15 years. Fecal occult blood test (FOBT) of the stool. You may have this test every year starting at age 19. Flexible sigmoidoscopy or colonoscopy. You may have a sigmoidoscopy every 5 years or a colonoscopy every 10 years starting at age 75. Hepatitis C blood test. Hepatitis B blood test. Sexually transmitted disease (STD) testing. Diabetes screening. This is done by checking your blood sugar (glucose)  after you have not eaten for a while (fasting). You may have this done every 1-3 years. Bone density scan. This is done to screen for osteoporosis. You may have this done starting at age 74. Mammogram. This may be done every 1-2 years. Talk to your health care provider about how often you should have regular mammograms. Talk with your health care provider about your test results, treatment options, and if necessary, the need for more tests. Vaccines  Your health care provider may  recommend certain vaccines, such as: Influenza vaccine. This is recommended every year. Tetanus, diphtheria, and acellular pertussis (Tdap, Td) vaccine. You may need a Td booster every 10 years. Zoster vaccine. You may need this after age 63. Pneumococcal 13-valent conjugate (PCV13) vaccine. One dose is recommended after age 69. Pneumococcal polysaccharide (PPSV23) vaccine. One dose is recommended after age 53. Talk to your health care provider about which screenings and vaccines you need and how often you need them. This information is not intended to replace advice given to you by your health care provider. Make sure you discuss any questions you have with your health care provider. Document Released: 08/06/2015 Document Revised: 03/29/2016 Document Reviewed: 05/11/2015 Elsevier Interactive Patient Education  2017 [place] Prevention in the Home Falls can cause injuries. They can happen to people of all ages. There are many things you can do to make your home safe and to help prevent falls. What can I do on the outside of my home? Regularly fix the edges of walkways and driveways and fix any cracks. Remove anything that might make you trip as you walk through a door, such as a raised step or threshold. Trim any bushes or trees on the path to your home. Use bright outdoor lighting. Clear any walking paths of anything that might make someone trip, such as rocks or tools. Regularly check to see if handrails are loose or broken. Make sure that both sides of any steps have handrails. Any raised decks and porches should have guardrails on the edges. Have any leaves, snow, or ice cleared regularly. Use sand or salt on walking paths during winter. Clean up any spills in your garage right away. This includes oil or grease spills. What can I do in the bathroom? Use night lights. Install grab bars by the toilet and in the tub and shower. Do not use towel bars as grab bars. Use non-skid  mats or decals in the tub or shower. If you need to sit down in the shower, use a plastic, non-slip stool. Keep the floor dry. Clean up any water that spills on the floor as soon as it happens. Remove soap buildup in the tub or shower regularly. Attach bath mats securely with double-sided non-slip rug tape. Do not have throw rugs and other things on the floor that can make you trip. What can I do in the bedroom? Use night lights. Make sure that you have a light by your bed that is easy to reach. Do not use any sheets or blankets that are too big for your bed. They should not hang down onto the floor. Have a firm chair that has side arms. You can use this for support while you get dressed. Do not have throw rugs and other things on the floor that can make you trip. What can I do in the kitchen? Clean up any spills right away. Avoid walking on wet floors. Keep items that you use a lot in easy-to-reach places. If  you need to reach something above you, use a strong step stool that has a grab bar. Keep electrical cords out of the way. Do not use floor polish or wax that makes floors slippery. If you must use wax, use non-skid floor wax. Do not have throw rugs and other things on the floor that can make you trip. What can I do with my stairs? Do not leave any items on the stairs. Make sure that there are handrails on both sides of the stairs and use them. Fix handrails that are broken or loose. Make sure that handrails are as long as the stairways. Check any carpeting to make sure that it is firmly attached to the stairs. Fix any carpet that is loose or worn. Avoid having throw rugs at the top or bottom of the stairs. If you do have throw rugs, attach them to the floor with carpet tape. Make sure that you have a light switch at the top of the stairs and the bottom of the stairs. If you do not have them, ask someone to add them for you. What else can I do to help prevent falls? Wear shoes  that: Do not have high heels. Have rubber bottoms. Are comfortable and fit you well. Are closed at the toe. Do not wear sandals. If you use a stepladder: Make sure that it is fully opened. Do not climb a closed stepladder. Make sure that both sides of the stepladder are locked into place. Ask someone to hold it for you, if possible. Clearly mark and make sure that you can see: Any grab bars or handrails. First and last steps. Where the edge of each step is. Use tools that help you move around (mobility aids) if they are needed. These include: Canes. Walkers. Scooters. Crutches. Turn on the lights when you go into a dark area. Replace any light bulbs as soon as they burn out. Set up your furniture so you have a clear path. Avoid moving your furniture around. If any of your floors are uneven, fix them. If there are any pets around you, be aware of where they are. Review your medicines with your doctor. Some medicines can make you feel dizzy. This can increase your chance of falling. Ask your doctor what other things that you can do to help prevent falls. This information is not intended to replace advice given to you by your health care provider. Make sure you discuss any questions you have with your health care provider. Document Released: 05/06/2009 Document Revised: 12/16/2015 Document Reviewed: 08/14/2014 Elsevier Interactive Patient Education  2017 [employer].

## 2021-09-20 NOTE — Progress Notes (Signed)
Subjective:   Daniel Guerrero is a 76 y.o. male who presents for Medicare Annual/Subsequent preventive examination.  Review of Systems    Virtual Visit via Telephone Note  I connected with  Daniel Guerrero on 09/20/21 at 10:30 AM EST by telephone and verified that I am speaking with the correct person using two identifiers.  Location: Patient: Home Provider: Office Persons participating in the virtual visit: patient/Nurse Health Advisor   I discussed the limitations, risks, security and privacy concerns of performing an evaluation and management service by telephone and the availability of in person appointments. The patient expressed understanding and agreed to proceed.  Interactive audio and video telecommunications were attempted between this nurse and patient, however failed, due to patient having technical difficulties OR patient did not have access to video capability.  We continued and completed visit with audio only.  Some vital signs may be absent or patient reported.   Criselda Peaches, LPN  Cardiac Risk Factors include: advanced age (>39men, >59 women);male gender     Objective:    Today's Vitals   09/20/21 1033  Weight: 190 lb (86.2 kg)  Height: 5\' 10"  (1.778 m)   Body mass index is 27.26 kg/m.  Advanced Directives 09/20/2021 09/05/2021 03/02/2021 08/25/2020 08/15/2019 12/26/2018 08/19/2018  Does Patient Have a Medical Advance Directive? Yes Yes Yes Yes Yes No;Yes Yes  Type of Paramedic of [place];Living will - - Special educational needs teacher of [place];Living will Healthcare Power of [place];Living will  Does patient want to make changes to medical advance directive? No - Patient declined - - - No - Patient declined - No - Patient declined  Copy of [place] in Chart? No - copy requested - - Yes - validated most recent copy scanned in chart (See row information) No - copy requested  - -  Would patient like information on creating a medical advance directive? - - - - - - -    Current Medications (verified) Outpatient Encounter Medications as of 09/20/2021  Medication Sig   amLODipine (NORVASC) 5 MG tablet TAKE 1 TABLET BY MOUTH EVERY DAY   cetirizine (ZYRTEC) 10 MG tablet Take 10 mg by mouth daily as needed for allergies.    CRANBERRY POWDER PO Take by mouth. 15,000 UNITS as needed   Cyanocobalamin (VITAMIN B-12 PO) Take 1 tablet by mouth daily.    desonide (DESOWEN) 0.05 % cream Apply 1 application topically daily as needed (rash).    diphenhydrAMINE HCl (BENADRYL ALLERGY PO) Take 1 tablet by mouth daily.   dutasteride (AVODART) 0.5 MG capsule Take 1 capsule (0.5 mg total) by mouth daily.   EPINEPHrine 0.3 mg/0.3 mL IJ SOAJ injection Inject 0.3 mLs (0.3 mg total) into the muscle as needed for anaphylaxis.   fluticasone (FLONASE) 50 MCG/ACT nasal spray INSTILL TWO SPRAYS IN EACH NOSTRIL DAILY AS NEEDED FOR ALLERGIES   hydrocortisone cream 1 % Apply 1 application topically daily as needed for itching.   lansoprazole (PREVACID) 30 MG capsule Take 1 capsule (30 mg total) by mouth daily before breakfast.   loperamide (IMODIUM) 2 MG capsule Take by mouth as needed for diarrhea or loose stools. Patient takes every 2-3 days.   memantine (NAMENDA) 10 MG tablet Take 1 tablet (10 mg)  twice a day   Multiple Vitamin (ONE DAILY) tablet Take by mouth.   Simethicone (GAS-X PO) Take 1 tablet by mouth daily as needed.    tamsulosin (  FLOMAX) 0.4 MG CAPS capsule Take 2 capsules (0.8 mg total) by mouth daily.   vitamin C (ASCORBIC ACID) 500 MG tablet Take 500 mg by mouth daily.   No facility-administered encounter medications on file as of 09/20/2021.    Allergies (verified) Peanut-containing drug products, Penicillins, and Quinolones   History: Past Medical History:  Diagnosis Date   Allergy    BPH (benign prostatic hyperplasia)    Cataract of both eyes    implants both eyes     Eczema    Enlarged aorta (HCC)    Erectile dysfunction    Hearing loss    high frequency   History of inguinal hernia repair, bilateral    Hypertension    Kidney stones    Loss of hearing    high frequency   Meniere disease    Paroxysmal SVT (supraventricular tachycardia) (HCC)    Psoriasis    S/P appendectomy    S/P tonsillectomy    Seasonal allergies    SVT (supraventricular tachycardia) (HCC)    Past Surgical History:  Procedure Laterality Date   Ablasion     APPENDECTOMY     CARDIAC ELECTROPHYSIOLOGY MAPPING AND ABLATION  2015   CHOLECYSTECTOMY     COLONOSCOPY     unsure where or when- states greater than 10 yrs ago per pt- was normal per pt and his wife    COLONOSCOPY WITH PROPOFOL N/A 08/26/2018   Procedure: COLONOSCOPY WITH PROPOFOL;  Surgeon: [name], MD;  [facility];  Service: Endoscopy;  Laterality: N/A;  10:00am   CYSTOSCOPY WITH RETROGRADE PYELOGRAM, URETEROSCOPY AND STENT PLACEMENT Left 12/30/2014   Procedure: CYSTOSCOPY WITH LEFT URETEROSCOPY STONE EXTRACTION WITH STENT;  Surgeon: Franchot Gallo, MD;  Location: [facility];  Service: Urology;  Laterality: Left;   ELECTROPHYSIOLOGIC STUDY N/A 08/24/2015   Procedure: SVT Ablation;  Surgeon: Thompson Grayer, MD;  Location: [facility];  Service: Cardiovascular;  Laterality: N/A;   ENDOVENOUS ABLATION SAPHENOUS VEIN W/ LASER Right 12/04/2016   endovenous laser ablation right greater saphenous vein by Tinnie Gens MD    ESOPHAGOGASTRODUODENOSCOPY N/A 08/18/2016   Procedure: ESOPHAGOGASTRODUODENOSCOPY (EGD);  Surgeon: [name], MD;  [facility];  Service: Endoscopy;  Laterality: N/A;   ESOPHAGOGASTRODUODENOSCOPY N/A 09/06/2016   Procedure: ESOPHAGOGASTRODUODENOSCOPY (EGD);  Surgeon: [name], MD;  [facility];  Service: Endoscopy;  Laterality: N/A;  100 - moved to 2/14 @ 2:15 per Tretha Sciara   ESOPHAGOGASTRODUODENOSCOPY (EGD) WITH PROPOFOL N/A 08/26/2018   Procedure:  ESOPHAGOGASTRODUODENOSCOPY (EGD) WITH PROPOFOL;  Surgeon: [name], MD;  [facility];  Service: Endoscopy;  Laterality: N/A;   EYE SURGERY     Catracts removed both eye 30 yrs ago   [place]     right and left   HOLMIUM LASER APPLICATION Left 0/07/7791   Procedure: HOLMIUM LASER APPLICATION;  Surgeon: Franchot Gallo, MD;  Location: [facility];  Service: Urology;  Laterality: Left;   MALONEY DILATION N/A 09/06/2016   Procedure: Venia Minks DILATION;  Surgeon: [name], MD;  [facility];  Service: Endoscopy;  Laterality: N/A;   MALONEY DILATION N/A 08/26/2018   Procedure: Venia Minks DILATION;  Surgeon: [name], MD;  [facility];  Service: Endoscopy;  Laterality: N/A;   POLYPECTOMY  08/26/2018   Procedure: POLYPECTOMY;  Surgeon: [name], MD;  [facility];  Service: Endoscopy;;  polyp at descending colon x2   TONSILLECTOMY  Family History  Problem Relation Age of Onset   Cancer Mother        lung   Thyroid disease Mother    Diabetes Father    GI Bleed Father    Aneurysm Father    AAA (abdominal aortic aneurysm) Father    AAA (abdominal aortic aneurysm) Brother    Colon cancer Neg Hx    Colon polyps Neg Hx    Rectal cancer Neg Hx    Stomach cancer Neg Hx    Social History   Socioeconomic History   Marital status: Married    Spouse name: Not on file   Number of children: 3   Years of education: 16   Highest education level: Bachelor's degree (e.g., BA, AB, BS)  Occupational History   Occupation: professor    Comment: retired  Tobacco Use   Smoking status: Former    Types: Cigarettes    Quit date: 07/24/1964    Years since quitting: 57.1   Smokeless tobacco: Never   Tobacco comments:    Quit 20-30 years ago; smoked pipe and cigars   Vaping Use   Vaping Use: Never used  Substance and Sexual Activity   Alcohol use: Yes    Alcohol/week: 2.0 standard drinks    Types: 2 Cans of beer per week    Comment: 2  per day   Drug use: No   Sexual activity: Not on file  Other Topics Concern   Not on file  Social History Narrative   Daily Caffeine use: 5-6 drinks daily      08/07/2018: No drug use, some daily alcohol use at night but patient does not feel like it disrupts his health or is a safety concern.       Lives with wife in two story home; three sons, five grandchildren in [place] area.   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    Right handed            Social Determinants of Health   Financial Resource Strain: Low Risk    Difficulty of Paying Living Expenses: Not hard at all  Food Insecurity: No Food Insecurity   Worried About Charity fundraiser in the Last Year: Never true   Ran Out of Food in the Last Year: Never true  Transportation Needs: No Transportation Needs   Lack of Transportation (Medical): No   Lack of Transportation (Non-Medical): No  Physical Activity: Sufficiently Active   Days of Exercise per Week: 4 days   Minutes of Exercise per Session: 60 min  Stress: No Stress Concern Present   Feeling of Stress : Not at all  Social Connections: Socially Integrated   Frequency of Communication with Friends and Family: More than three times a week   Frequency of Social Gatherings with Friends and Family: More than three times a week   Attends Religious Services: More than 4 times per year   Active Member of [employer] or Organizations: Yes   Attends Music therapist: More than 4 times per year   Marital Status: Married    Tobacco Counseling Counseling given: Not Answered Tobacco comments: Quit 20-30 years ago; smoked pipe and cigars    Clinical Intake:  Pre-visit preparation completed: Yes  Pain : No/denies pain     BMI - recorded: 28.61 Nutritional Status: BMI 25 -29 Overweight Nutritional Risks: None Diabetes: No  How often do you need to have someone help  you when you read  instructions, pamphlets, or other written materials from your doctor or pharmacy?: 1 - Never  Diabetic? No  Interpreter Needed?: No  Information entered by :: Rolene Arbour LPN   Activities of Daily Living No flowsheet data found.  Patient Care Team: Dorothyann Peng, NP as PCP - General (Family Medicine) Herminio Commons, MD (Inactive) as PCP - Cardiology (Cardiology) Grace Isaac, MD (Inactive) as Consulting Physician (Cardiothoracic Surgery) Thompson Grayer, MD as Consulting Physician (Cardiology) Herminio Commons, MD (Inactive) as Attending Physician (Cardiology) Susa Day, MD as Consulting Physician (Orthopedic Surgery) Lucas Mallow, MD as Consulting Physician (Urology) Jola Schmidt, MD as Consulting Physician (Ophthalmology) Gala Romney Cristopher Estimable, MD as Consulting Physician (Gastroenterology)  Indicate any recent Medical Services you may have received from other than [facility] providers in the past year (date may be approximate).     Assessment:   This is a routine wellness examination for Aayan.  Hearing/Vision screen Hearing Screening - Comments:: Wears hearing aids Vision Screening - Comments:: No vision difficulty. Followed by Dr Valetta Close  Dietary issues and exercise activities discussed:     Goals Addressed               This Visit's Progress     Patient Stated (pt-stated)        To continue walking this year Walks outdoors Baseline; try to increase up to 30 minutes 5 days a week.         Depression Screen PHQ 2/9 Scores 09/20/2021 08/25/2020 06/08/2020 08/15/2019 08/07/2018 07/27/2017 10/19/2015  PHQ - 2 Score 0 0 0 0 1 0 0  PHQ- 9 Score - - - - 2 - -    Fall Risk Fall Risk  09/20/2021 09/05/2021 03/02/2021 08/25/2020 06/08/2020  Falls in the past year? 0 0 - 0 0  Number falls in past yr: 0 0 0 0 0  Injury with Fall? 0 0 0 0 0  Comment - - - - -  Risk for fall due to : No Fall Risks - - Impaired mobility;Impaired balance/gait;Impaired vision -   Follow up - - - Falls prevention discussed -    FALL RISK PREVENTION PERTAINING TO THE HOME:  Any stairs in or around the home? Yes  If so, are there any without handrails? No  Home free of loose throw rugs in walkways, pet beds, electrical cords, etc? Yes  Adequate lighting in your home to reduce risk of falls? Yes   ASSISTIVE DEVICES UTILIZED TO PREVENT FALLS:  Life alert? No  Use of a cane, walker or w/c? No  Grab bars in the bathroom? No  Shower chair or bench in shower? No  Elevated toilet seat or a handicapped toilet? No   TIMED UP AND GO:  Was the test performed? No . Audio Visit  Cognitive Function: MMSE - Mini Mental State Exam 02/01/2021 07/27/2017  Not completed: - (No Data)  Orientation to time 5 -  Orientation to Place 5 -  Registration 3 -  Attention/ Calculation 4 -  Recall 1 -  Language- name 2 objects 2 -  Language- repeat 1 -  Language- follow 3 step command 3 -  Language- read & follow direction 1 -  Write a sentence 1 -  Copy design 1 -  Total score 27 -   Montreal Cognitive Assessment  03/02/2021  Visuospatial/ Executive (0/5) 4  Naming (0/3) 2  Attention: Read list of digits (0/2) 2  Attention: Read list of letters (  0/1) 1  Attention: Serial 7 subtraction starting at 100 (0/3) 3  Language: Repeat phrase (0/2) 2  Language : Fluency (0/1) 1  Abstraction (0/2) 1  Delayed Recall (0/5) 0  Orientation (0/6) 4  Total 20  Adjusted Score (based on education) 20   6CIT Screen 09/20/2021 08/25/2020 08/15/2019  What Year? 0 points 0 points 0 points  What month? 0 points 0 points 0 points  What time? 0 points - 0 points  Count back from 20 0 points 0 points 0 points  Months in reverse 0 points 4 points 0 points  Repeat phrase 0 points 10 points 0 points  Total Score 0 - 0    Immunizations Immunization History  Administered Date(s) Administered   Hepatitis A 09/28/2017   Influenza, High Dose Seasonal PF 04/14/2016   Influenza,inj,Quad PF,6+ Mos  04/16/2014, 05/01/2018   Influenza,inj,quad, With Preservative 05/22/2017   Influenza-Unspecified 05/25/2015, 05/24/2017, 04/08/2019, 03/23/2020   PFIZER(Purple Top)SARS-COV-2 Vaccination 09/07/2019, 09/30/2019, 03/20/2020   Pneumococcal Conjugate-13 04/14/2016   Pneumococcal Polysaccharide-23 04/23/2008, 12/18/2013, 05/01/2018   Td 12/13/2007   Tdap 09/28/2017   Zoster Recombinat (Shingrix) 08/10/2017, 12/06/2017    TDAP status: Up to date  Flu Vaccine status: Up to date  Pneumococcal vaccine status: Up to date  Covid-19 vaccine status: Completed vaccines  Qualifies for Shingles Vaccine? Yes   Zostavax completed Yes   Shingrix Completed?: Yes  Screening Tests Health Maintenance  Topic Date Due   COVID-19 Vaccine (4 - Booster for Pfizer series) 05/15/2020   COLONOSCOPY (Pts 45-41yrs Insurance coverage will need to be confirmed)  08/26/2021   TETANUS/TDAP  09/29/2027   Pneumonia Vaccine 61+ Years old  Completed   INFLUENZA VACCINE  Completed   Hepatitis C Screening  Completed   Zoster Vaccines- Shingrix  Completed   HPV VACCINES  Aged Out   FOOT EXAM  Discontinued   HEMOGLOBIN A1C  Discontinued   OPHTHALMOLOGY EXAM  Discontinued   URINE MICROALBUMIN  Discontinued    Health Maintenance  Health Maintenance Due  Topic Date Due   COVID-19 Vaccine (4 - Booster for [place] series) 05/15/2020   COLONOSCOPY (Pts 45-32yrs Insurance coverage will need to be confirmed)  08/26/2021    Colorectal cancer screening: Type of screening: Colonoscopy. Completed 08/26/18. Repeat every 3 years  Lung Cancer Screening: (Low Dose CT Chest recommended if Age 83-80 years, 30 pack-year currently smoking OR have quit w/in 15years.) does not qualify.     Additional Screening:  Hepatitis C Screening: does qualify; Completed 03/25/21  Vision Screening: Recommended annual ophthalmology exams for early detection of glaucoma and other disorders of the eye. Is the patient up to date with their  annual eye exam?  Yes  Who is the provider or what is the name of the office in which the patient attends annual eye exams? Dr Jeneen Rinks If pt is not established with a provider, would they like to be referred to a provider to establish care? No .   Dental Screening: Recommended annual dental exams for proper oral hygiene  Community Resource Referral / Chronic Care Management:  CRR required this visit?  No   CCM required this visit?  No      Plan:     I have personally reviewed and noted the following in the patients chart:   Medical and social history Use of alcohol, tobacco or illicit drugs  Current medications and supplements including opioid prescriptions. Patient is not currently taking opioid prescriptions. Functional ability and status Nutritional status Physical  activity Advanced directives List of other physicians Hospitalizations, surgeries, and ER visits in previous 12 months Vitals Screenings to include cognitive, depression, and falls Referrals and appointments  In addition, I have reviewed and discussed with patient certain preventive protocols, quality metrics, and best practice recommendations. A written personalized care plan for preventive services as well as general preventive health recommendations were provided to patient.     Criselda Peaches, LPN   [id number]/[id number]   Nurse Notes: None

## 2021-11-10 DIAGNOSIS — Z23 Encounter for immunization: Secondary | ICD-10-CM | POA: Diagnosis not present

## 2021-11-23 ENCOUNTER — Encounter: Payer: Self-pay | Admitting: Internal Medicine

## 2021-11-30 DIAGNOSIS — S0502XA Injury of conjunctiva and corneal abrasion without foreign body, left eye, initial encounter: Secondary | ICD-10-CM | POA: Diagnosis not present

## 2021-11-30 DIAGNOSIS — H1132 Conjunctival hemorrhage, left eye: Secondary | ICD-10-CM | POA: Diagnosis not present

## 2021-12-13 ENCOUNTER — Telehealth (INDEPENDENT_AMBULATORY_CARE_PROVIDER_SITE_OTHER): Payer: Medicare Other | Admitting: Gastroenterology

## 2021-12-13 ENCOUNTER — Encounter: Payer: Self-pay | Admitting: Gastroenterology

## 2021-12-13 ENCOUNTER — Telehealth: Payer: Self-pay | Admitting: *Deleted

## 2021-12-13 VITALS — Ht 70.0 in | Wt 190.0 lb

## 2021-12-13 DIAGNOSIS — K219 Gastro-esophageal reflux disease without esophagitis: Secondary | ICD-10-CM | POA: Diagnosis not present

## 2021-12-13 MED ORDER — LANSOPRAZOLE 30 MG PO CPDR: 30.0000 mg | DELAYED_RELEASE_CAPSULE | Freq: Every day | ORAL | 3 refills | Status: DC

## 2021-12-13 NOTE — Telephone Encounter (Signed)
Daniel Guerrero, you are scheduled for a virtual visit with your provider today.  Just as we do with appointments in the office, we must obtain your consent to participate.  Your consent will be active for this visit and any virtual visit you may have with one of our providers in the next 365 days.  If you have a MyChart account, I can also send a copy of this consent to you electronically.  All virtual visits are billed to your insurance company just like a traditional visit in the office.  As this is a virtual visit, video technology does not allow for your provider to perform a traditional examination.  This may limit your provider's ability to fully assess your condition.  If your provider identifies any concerns that need to be evaluated in person or the need to arrange testing such as labs, EKG, etc, we will make arrangements to do so.  Although advances in technology are sophisticated, we cannot ensure that it will always work on either your end or our end.  If the connection with a video visit is poor, we may have to switch to a telephone visit.  With either a video or telephone visit, we are not always able to ensure that we have a secure connection.   I need to obtain your verbal consent now.   Are you willing to proceed with your visit today?

## 2021-12-13 NOTE — Progress Notes (Signed)
Primary Care Physician:  Dorothyann Peng, NP  Primary GI: Dr. Gala Romney   Patient Location: Home   Provider Location: [facility] office   Reason for Visit: Yearly follow-up   Persons present on the virtual encounter, with roles:    Total time (minutes) spent on medical discussion: 8 minutes   Due to COVID-19, visit was conducted using virtual method.  Visit was requested by patient.  Virtual Visit via Telephone Note Due to COVID-19, visit is conducted virtually and was requested by patient.   I connected with Daniel Guerrero on 12/13/21 at 11:30 AM EDT by telephone and verified that I am speaking with the correct person using two identifiers.   I discussed the limitations, risks, security and privacy concerns of performing an evaluation and management service by telephone and the availability of in person appointments. I also discussed with the patient that there may be a patient responsible charge related to this service. The patient expressed understanding and agreed to proceed.  Chief Complaint  Patient presents with   Follow-up    Pt doing good     History of Present Illness: Daniel Guerrero is a 76 year old male presenting via telephone for yearly visit. He is unable to connect with video. History of chronic GERD, Schatzki ring s/p dilation last in 2020, and tubular adenomas with surveillance due in 2025 if health permits.   Post-cholecystectomy state. Will have intermittent diarrhea depending on food choices. Chronic. Improved. Prevacid 30 mg daily. The last few weeks has had mild difficulty with dysphagia but not enough to want to do anything. Unable to pinpoint types of food. Wonders if he rushing to eat. Does not want to pursue EGD at this time.     Past Medical History:  Diagnosis Date   Allergy    BPH (benign prostatic hyperplasia)    Cataract of both eyes    implants both eyes    Eczema    Enlarged aorta (HCC)    Erectile dysfunction    Hearing loss    high  frequency   History of inguinal hernia repair, bilateral    Hypertension    Kidney stones    Loss of hearing    high frequency   Meniere disease    Paroxysmal SVT (supraventricular tachycardia) (HCC)    Psoriasis    S/P appendectomy    S/P tonsillectomy    Seasonal allergies    SVT (supraventricular tachycardia) (HCC)      Past Surgical History:  Procedure Laterality Date   Ablasion     APPENDECTOMY     CARDIAC ELECTROPHYSIOLOGY MAPPING AND ABLATION  2015   CHOLECYSTECTOMY     COLONOSCOPY     unsure where or when- states greater than 10 yrs ago per pt- was normal per pt and his wife    COLONOSCOPY WITH PROPOFOL N/A 08/26/2018   two tubular adenomas,  diverticulosis.  Repeat colonoscopy in February 2025 if overall health permits.   CYSTOSCOPY WITH RETROGRADE PYELOGRAM, URETEROSCOPY AND STENT PLACEMENT Left 12/30/2014   Procedure: CYSTOSCOPY WITH LEFT URETEROSCOPY STONE EXTRACTION WITH STENT;  Surgeon: Franchot Gallo, MD;  Location: [facility];  Service: Urology;  Laterality: Left;   ELECTROPHYSIOLOGIC STUDY N/A 08/24/2015   Procedure: SVT Ablation;  Surgeon: Thompson Grayer, MD;  Location: [place] [facility];  Service: Cardiovascular;  Laterality: N/A;   ENDOVENOUS ABLATION SAPHENOUS VEIN W/ LASER Right 12/04/2016   endovenous laser ablation right greater saphenous vein by Tinnie Gens MD  ESOPHAGOGASTRODUODENOSCOPY N/A 08/18/2016   Procedure: ESOPHAGOGASTRODUODENOSCOPY (EGD);  Surgeon: [name], MD;  Location: [facility];  Service: Endoscopy;  Laterality: N/A;   ESOPHAGOGASTRODUODENOSCOPY N/A 09/06/2016   Procedure: ESOPHAGOGASTRODUODENOSCOPY (EGD);  Surgeon: [name], MD;  Location: [facility];  Service: Endoscopy;  Laterality: N/A;  100 - moved to 2/14 @ 2:15 per Tretha Sciara   ESOPHAGOGASTRODUODENOSCOPY (EGD) WITH PROPOFOL N/A 08/26/2018   Mild Schatzki ring status post dilation, small hiatal hernia.   EYE SURGERY     Catracts removed both eye 30 yrs ago   [place]     right and left   HOLMIUM LASER APPLICATION Left 24/26/8341   Procedure: HOLMIUM LASER APPLICATION;  Surgeon: Franchot Gallo, MD;  Location: [facility];  Service: Urology;  Laterality: Left;   MALONEY DILATION N/A 09/06/2016   Procedure: Venia Minks DILATION;  Surgeon: [name], MD;  Location: [facility];  Service: Endoscopy;  Laterality: N/A;   MALONEY DILATION N/A 08/26/2018   Procedure: Venia Minks DILATION;  Surgeon: [name], MD;  Location: [facility];  Service: Endoscopy;  Laterality: N/A;   POLYPECTOMY  08/26/2018   Procedure: POLYPECTOMY;  Surgeon: [name], MD;  Location: [facility];  Service: Endoscopy;;  polyp at descending colon x2   TONSILLECTOMY       Current Meds  Medication Sig   amLODipine (NORVASC) 5 MG tablet TAKE 1 TABLET BY MOUTH EVERY DAY   cetirizine (ZYRTEC) 10 MG tablet Take 10 mg by mouth daily as needed for allergies.    CRANBERRY POWDER PO Take by mouth. 15,000 UNITS as needed   Cyanocobalamin (VITAMIN B-12 PO) Take 1 tablet by mouth daily.    desonide (DESOWEN) 0.05 % cream Apply 1 application topically daily as needed (rash).    diphenhydrAMINE HCl (BENADRYL ALLERGY PO) Take 1 tablet by mouth daily.   dutasteride (AVODART) 0.5 MG capsule Take 1 capsule (0.5 mg total) by mouth daily.   EPINEPHrine 0.3 mg/0.3 mL IJ SOAJ injection Inject 0.3 mLs (0.3 mg total) into the muscle as needed for anaphylaxis.   fluticasone (FLONASE) 50 MCG/ACT nasal spray INSTILL TWO SPRAYS IN EACH NOSTRIL DAILY AS NEEDED FOR ALLERGIES   hydrocortisone cream 1 % Apply 1 application topically daily as needed for itching.   loperamide (IMODIUM) 2 MG capsule Take by mouth as needed for diarrhea or loose stools. Patient takes every 2-3 days.   memantine (NAMENDA) 10 MG tablet Take 1 tablet (10 mg)  twice a day   Multiple Vitamin (ONE DAILY) tablet Take by mouth.   Simethicone (GAS-X PO) Take 1 tablet by mouth daily as needed.    tamsulosin (FLOMAX) 0.4 MG  CAPS capsule Take 2 capsules (0.8 mg total) by mouth daily.   vitamin C (ASCORBIC ACID) 500 MG tablet Take 500 mg by mouth daily.   [DISCONTINUED] lansoprazole (PREVACID) 30 MG capsule Take 1 capsule (30 mg total) by mouth daily before breakfast.     Family History  Problem Relation Age of Onset   Cancer Mother        lung   Thyroid disease Mother    Diabetes Father    GI Bleed Father    Aneurysm Father    AAA (abdominal aortic aneurysm) Father    AAA (abdominal aortic aneurysm) Brother    Colon cancer Neg Hx    Colon polyps Neg Hx    Rectal cancer Neg Hx    Stomach cancer Neg Hx  Social History   Socioeconomic History   Marital status: Married    Spouse name: Not on file   Number of children: 3   Years of education: 16   Highest education level: Bachelor's degree (e.g., BA, AB, BS)  Occupational History   Occupation: professor    Comment: retired  Tobacco Use   Smoking status: Former    Types: Cigarettes    Quit date: 07/24/1964    Years since quitting: 57.4   Smokeless tobacco: Never   Tobacco comments:    Quit 20-30 years ago; smoked pipe and cigars   Vaping Use   Vaping Use: Never used  Substance and Sexual Activity   Alcohol use: Yes    Alcohol/week: 2.0 standard drinks    Types: 2 Cans of beer per week    Comment: 2 per day   Drug use: No   Sexual activity: Not on file  Other Topics Concern   Not on file  Social History Narrative   Daily Caffeine use: 5-6 drinks daily      08/07/2018: No drug use, some daily alcohol use at night but patient does not feel like it disrupts his health or is a safety concern.       Lives with wife in two story home; three sons, five grandchildren in [place] area.   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    Right handed            Social Determinants of Health   Financial Resource Strain: Low Risk    Difficulty of Paying Living Expenses: Not hard  at all  Food Insecurity: No Food Insecurity   Worried About Charity fundraiser in the Last Year: Never true   Ran Out of Food in the Last Year: Never true  Transportation Needs: No Transportation Needs   Lack of Transportation (Medical): No   Lack of Transportation (Non-Medical): No  Physical Activity: Sufficiently Active   Days of Exercise per Week: 4 days   Minutes of Exercise per Session: 60 min  Stress: No Stress Concern Present   Feeling of Stress : Not at all  Social Connections: Socially Integrated   Frequency of Communication with Friends and Family: More than three times a week   Frequency of Social Gatherings with Friends and Family: More than three times a week   Attends Religious Services: More than 4 times per year   Active Member of [employer] or Organizations: Yes   Attends Music therapist: More than 4 times per year   Marital Status: Married       Review of Systems: Gen: Denies fever, chills, anorexia. Denies fatigue, weakness, weight loss.  CV: Denies chest pain, palpitations, syncope, peripheral edema, and claudication. Resp: Denies dyspnea at rest, cough, wheezing, coughing up blood, and pleurisy. GI: see HPI Derm: Denies rash, itching, dry skin Psych: Denies depression, anxiety, memory loss, confusion. No homicidal or suicidal ideation.  Heme: Denies bruising, bleeding, and enlarged lymph nodes.  Observations/Objective: No distress. Unable to perform physical exam due to telephone encounter.   Assessment and Plan: 76 year old male presenting via telephone for yearly visit with history of chronic GERD and intermittent diarrhea.  GERD: well-controlled on Prevacid daily. He notes a few episodes of dysphagia but feels he was eating too fast. Declining EGD. Last EGD/dilation in 2020 of Schatzki's ring. Discussed diet/behavior modification. He will call with any issues.   Diarrhea: post-cholecystectomy. Related to food  choices. This is actually  improved.   Prevacid refills provided. Return in 1 year. Call if any issues with dysphagia and recommend EGD/dilation.    Follow Up Instructions:    I discussed the assessment and treatment plan with the patient. The patient was provided an opportunity to ask questions and all were answered. The patient agreed with the plan and demonstrated an understanding of the instructions.   The patient was advised to call back or seek an in-person evaluation if the symptoms worsen or if the condition fails to improve as anticipated.  I provided 8 minutes of telephone time during this MyChart Video encounter.  Annitta Needs, PhD, ANP-BC [facility]

## 2021-12-13 NOTE — Telephone Encounter (Signed)
Pt consented to a virtual visit. 

## 2021-12-13 NOTE — Patient Instructions (Signed)
It was good talking with you today!  I have refilled Prevacid for you.  We will see you back in 1 year.  Please call if any issues swallowing!  Annitta Needs, PhD, ANP-BC [facility]

## 2022-01-16 ENCOUNTER — Other Ambulatory Visit: Payer: Self-pay | Admitting: Gastroenterology

## 2022-01-16 DIAGNOSIS — K219 Gastro-esophageal reflux disease without esophagitis: Secondary | ICD-10-CM

## 2022-02-03 DIAGNOSIS — H43813 Vitreous degeneration, bilateral: Secondary | ICD-10-CM | POA: Diagnosis not present

## 2022-02-03 DIAGNOSIS — Z961 Presence of intraocular lens: Secondary | ICD-10-CM | POA: Diagnosis not present

## 2022-03-06 ENCOUNTER — Encounter: Payer: Self-pay | Admitting: Physician Assistant

## 2022-03-06 ENCOUNTER — Ambulatory Visit (INDEPENDENT_AMBULATORY_CARE_PROVIDER_SITE_OTHER): Payer: Medicare Other | Admitting: Physician Assistant

## 2022-03-06 VITALS — BP 155/88 | HR 88 | Resp 18 | Ht 69.0 in | Wt 199.0 lb

## 2022-03-06 DIAGNOSIS — F09 Unspecified mental disorder due to known physiological condition: Secondary | ICD-10-CM | POA: Diagnosis not present

## 2022-03-06 DIAGNOSIS — R413 Other amnesia: Secondary | ICD-10-CM | POA: Diagnosis not present

## 2022-03-06 MED ORDER — MEMANTINE HCL 10 MG PO TABS: ORAL_TABLET | ORAL | 11 refills | Status: DC

## 2022-03-06 NOTE — Progress Notes (Incomplete)
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  Neuropsych evaluation was canceled by patient (this was scheduled for January 2023) because he reportedly "felt well".   Recommendations:    Continue memantine 10 mg twice daily, side effects discussed. Recommend good control of cardiovascular risk factors. Neurocognitive testing for clarity of the diagnosis and disease trajectory, as well as to determine other sources contributing to memory loss I.e possible ADD  Discontinue alcohol intake Monitor driving Follow up in 6 months   Case discussed with Dr. Delice Guerrero who agrees with the plan     Subjective:   This patient is accompanied in the office by male who supplements the history.  Previous records as well as any outside records available were reviewed prior to todays visit.  Patient is currently on Memantine 10 mg bid    Any changes in memory since last visit? "Wife says that is worse, especially when drinking alcohol".   "She keeps a calendar not to lose his sense of time, I just look at the calendar". He likes to play computer games, legos, cwo, wf,  Patient lives with: wife repeats oneself?  Endorsed Disoriented when walking into a room?  Patient denies   Leaving objects in unusual places?  Patient denies   Ambulates  with difficulty?  He likes to walk with his wife.  He also uses stationary bicycle.  He had physical therapy for posture improvement in January of this year with good results.  Patient denies   Recent falls?  Patient denies   Any head injuries?  Patient denies   History of seizures?   Patient denies   Wandering behavior?  Patient denies   Patient drives?  He drives,  but at times he depends much of his GPS that when he does not use it, he feels lost. Any mood changes since last visit? He gets rougher when he drinks Any worsening depression?:  Patient denies   Hallucinations?  Patient denies   Paranoia?  Patient denies   Patient reports that male  sleeps well without vivid dreams, REM behavior or sleepwalking, but does have to get up during the night to urinate. History of sleep apnea?  Patient denies   Any hygiene concerns?  Patient denies   Independent of bathing and dressing?  Endorsed  Does the patient needs help with medications?  Denies Who is in charge of the finances?   Wife is in charge (always, she is an Optometrist) Any changes in appetite?  Patient denies   Patient have trouble swallowing? Patient denies   Does the patient cook?  Patient denies   Any kitchen accidents such as leaving the stove on? Patient denies   Any headaches?  He has a history of migraines, about once a month. Double vision? Patient denies   Any focal numbness or tingling?  Patient denies   Chronic back pain Patient denies   Unilateral weakness?  Patient denies   Any tremors?  Patient denies   Any history of anosmia?  Patient denies   Any incontinence of urine?  Patient denies   Any bowel dysfunction?   He has intermittent diarrhea depending on the food choices.  This is chronic due to gallbladder disease. He continues to drink  2 tall beers a day      Initial visit 03/02/21 the patient is seen in neurologic consultation at the request of Daniel Peng, NP for the evaluation of memory.  The patient is accompanied by wife Daniel Guerrero who supplements the history. 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  He states that recalling names and numbers became more difficult, even numbers that he had dialed  frequently.  "I have been always bad with dates, so I was not too worried when that all I could not remember "    His mood has been good, without depression or irritability.  He likes to enjoy his retirement, working on his yard, walking with his wife, and using his stationary bicycle.  He still would like to go back to teaching if he could.  He falls asleep well at night, but because of frequent urination he wakes up about 3 times to go to the bathroom, which interferes with his sleep.  He has begun taking Flomax which seems to help somehow.  He denies any vivid dreams or sleepwalking.  He denies hallucinations, paranoia, or leaving objects in unusual places.   He is independent of bathing and dressing.  Occasionally, he forgets to take a dose of medication.  He tries to set then in front of the breakfast table in a pillbox and his wife monitors closely.  She also manages the finances, she is an Optometrist, and has been doing so "forever ".  His appetite is good, but when he was having worse diarrhea, had lost a significant amount of weight, using this opportunity to  eat better and stay within the 180 to 190 pound range.  He denies trouble swallowing.  He never cooks, his wife always did the cooking.   He ambulates without difficulty without the use of a walker or a cane.  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  He drives and uses his GPS, without getting lost  -he depends so much of his GPS, but at times he may feel lost without it.  He denies any recent headaches -He has a history of migraines, but they are not life limiting with a frequency of about once a month.  He denies any falls, head injuries, double vision, dizziness, vertigo, focal numbness or tingling, unilateral weakness or tremors, denies anosmia, never had COVID.  He denies a history of sleep apnea, alcohol or tobacco abuse.  Family  history negative for dementia.     Labs  02/01/21   TSH nl 1.42 B12  nl 539 UA neg CBC normal      MRI brain 02/16/21  No evidence of acute intracranial abnormality.  Mild chronic small vessel ischemic changes within the cerebral white matter and pons, similar as compared to the brain MRI of 01/25/2018. 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  Mild generalized cerebral and cerebellar atrophy.  Mild bilateral ethmoid and maxillary sinus mucosal thickening  Past Medical History:  Diagnosis Date   Allergy    BPH (benign prostatic hyperplasia)    Cataract of both eyes    implants both eyes  Eczema    Enlarged aorta (HCC)    Erectile dysfunction    Hearing loss    high frequency   History of inguinal hernia repair, bilateral    Hypertension    Kidney stones    Loss of hearing    high frequency   Meniere disease    Paroxysmal SVT (supraventricular tachycardia) (HCC)    Psoriasis    S/P appendectomy    S/P tonsillectomy    Seasonal allergies    SVT (supraventricular tachycardia) (HCC)      Past Surgical History:  Procedure Laterality Date   Ablasion     APPENDECTOMY     CARDIAC ELECTROPHYSIOLOGY MAPPING AND ABLATION  2015   CHOLECYSTECTOMY     COLONOSCOPY     unsure where or when- states greater than 10 yrs ago per pt- was normal per pt and his wife    COLONOSCOPY WITH PROPOFOL N/A 08/26/2018   two tubular adenomas,  diverticulosis.  Repeat colonoscopy in February 2025 if overall health permits.   CYSTOSCOPY WITH RETROGRADE PYELOGRAM, URETEROSCOPY AND STENT PLACEMENT Left 12/30/2014   Procedure: CYSTOSCOPY WITH LEFT URETEROSCOPY STONE EXTRACTION WITH STENT;  Surgeon: Franchot Gallo, MD;  Location: [facility];  Service: Urology;  Laterality: Left;   ELECTROPHYSIOLOGIC STUDY N/A 08/24/2015   Procedure: SVT Ablation;  Surgeon: Thompson Grayer, MD;  Location:  [facility];  Service: Cardiovascular;  Laterality: N/A;   ENDOVENOUS ABLATION SAPHENOUS VEIN W/ LASER Right 12/04/2016   endovenous laser ablation right greater saphenous vein by Tinnie Gens MD    ESOPHAGOGASTRODUODENOSCOPY N/A 08/18/2016   Procedure: ESOPHAGOGASTRODUODENOSCOPY (EGD);  Surgeon: [name], MD;  Location: [facility];  Service: Endoscopy;  Laterality: N/A;   ESOPHAGOGASTRODUODENOSCOPY N/A 09/06/2016   Procedure: ESOPHAGOGASTRODUODENOSCOPY (EGD);  Surgeon: [name], MD;  Location: [facility];  Service: Endoscopy;  Laterality: N/A;  100 - moved to 2/14 @ 2:15 per Tretha Sciara   ESOPHAGOGASTRODUODENOSCOPY (EGD) WITH PROPOFOL N/A 08/26/2018   Mild Schatzki ring status post dilation, small hiatal hernia.   EYE SURGERY     Catracts removed both eye 30 yrs ago   [place]     right and left   HOLMIUM LASER APPLICATION Left 53/66/4403   Procedure: HOLMIUM LASER APPLICATION;  Surgeon: Franchot Gallo, MD;  Location: [facility];  Service: Urology;  Laterality: Left;   MALONEY DILATION N/A 09/06/2016   Procedure: Venia Minks DILATION;  Surgeon: [name], MD;  Location: [facility];  Service: Endoscopy;  Laterality: N/A;   MALONEY DILATION N/A 08/26/2018   Procedure: Venia Minks DILATION;  Surgeon: [name], MD;  Location: [facility];  Service: Endoscopy;  Laterality: N/A;   POLYPECTOMY  08/26/2018   Procedure: POLYPECTOMY;  Surgeon: [name], MD;  Location: [facility];  Service: Endoscopy;;  polyp at descending colon x2   TONSILLECTOMY       PREVIOUS MEDICATIONS:   CURRENT MEDICATIONS:  Outpatient Encounter Medications as of 03/06/2022  Medication Sig   amLODipine (NORVASC) 5 MG tablet TAKE 1 TABLET BY MOUTH EVERY DAY   cetirizine (ZYRTEC) 10 MG tablet Take 10 mg by mouth daily as needed for allergies.    CRANBERRY POWDER PO Take by mouth. 15,000 UNITS as needed   Cyanocobalamin (VITAMIN B-12 PO) Take 1 tablet by mouth daily.    desonide  (DESOWEN) 0.05 % cream Apply 1 application topically daily as needed (rash).    diphenhydrAMINE HCl (BENADRYL ALLERGY PO) Take 1 tablet by mouth daily.  dutasteride (AVODART) 0.5 MG capsule Take 1 capsule (0.5 mg total) by mouth daily.   EPINEPHrine 0.3 mg/0.3 mL IJ SOAJ injection Inject 0.3 mLs (0.3 mg total) into the muscle as needed for anaphylaxis.   fluticasone (FLONASE) 50 MCG/ACT nasal spray INSTILL TWO SPRAYS IN EACH NOSTRIL DAILY AS NEEDED FOR ALLERGIES   hydrocortisone cream 1 % Apply 1 application topically daily as needed for itching.   lansoprazole (PREVACID) 30 MG capsule TAKE 1 CAPSULE BY MOUTH DAILY BEFORE BREAKFAST   loperamide (IMODIUM) 2 MG capsule Take by mouth as needed for diarrhea or loose stools. Patient takes every 2-3 days.   memantine (NAMENDA) 10 MG tablet Take 1 tablet (10 mg)  twice a day   Multiple Vitamin (ONE DAILY) tablet Take by mouth.   Simethicone (GAS-X PO) Take 1 tablet by mouth daily as needed.    tamsulosin (FLOMAX) 0.4 MG CAPS capsule Take 2 capsules (0.8 mg total) by mouth daily.   vitamin C (ASCORBIC ACID) 500 MG tablet Take 500 mg by mouth daily.   No facility-administered encounter medications on file as of 03/06/2022.     Objective:     PHYSICAL EXAMINATION:    VITALS:   Vitals:   03/06/22 1256  BP: (!) 155/88  Pulse: 88  Resp: 18  SpO2: 95%  Weight: 199 lb (90.3 kg)  Height: '5\' 9"'$  (1.753 m)    GEN:  The patient appears stated age and is in NAD. HEENT:  Normocephalic, atraumatic.   Neurological examination:  General: NAD, well-groomed, appears stated age. Orientation: The patient is alert. Oriented to person, place and date Cranial nerves: There is good facial symmetry.The speech is fluent and clear. No aphasia or dysarthria. Fund of knowledge is appropriate. Recent memory impaired and remote memory is normal.  Attention and concentration are normal.  Able to name objects and repeat phrases.  Hearing is intact to conversational  tone.    Sensation: Sensation is intact to light touch throughout Motor: Strength is at least antigravity x4. Tremors: none  DTR's 2/4 in UE/LE      03/02/2021    3:00 PM  Montreal Cognitive Assessment   Visuospatial/ Executive (0/5) 4  Naming (0/3) 2  Attention: Read list of digits (0/2) 2  Attention: Read list of letters (0/1) 1  Attention: Serial 7 subtraction starting at 100 (0/3) 3  Language: Repeat phrase (0/2) 2  Language : Fluency (0/1) 1  Abstraction (0/2) 1  Delayed Recall (0/5) 0  Orientation (0/6) 4  Total 20  Adjusted Score (based on education) 20       02/01/2021    5:45 PM  MMSE - Mini Mental State Exam  Orientation to time 5  Orientation to Place 5  Registration 3  Attention/ Calculation 4  Recall 1  Language- name 2 objects 2  Language- repeat 1  Language- follow 3 step command 3  Language- read & follow direction 1  Write a sentence 1  Copy design 1  Total score 27       Movement examination: Tone: There is normal tone in the UE/LE Abnormal movements:  no tremor.  No myoclonus.  No asterixis.   Coordination:  There is no decremation with RAM's. Normal finger to nose  Gait and Station: The patient has no difficulty arising out of a deep-seated chair without the use of the hands. The patient's stride length is good.  Gait is cautious and narrow.   Thank you for allowing [place] the opportunity to participate  in the care of this nice patient. Please do not hesitate to contact us for any questions or concerns.   Total time spent on today's visit was *** minutes dedicated to this patient today, preparing to see patient, examining the patient, ordering tests and/or medications and counseling the patient, documenting clinical information in the EHR or other health record, independently interpreting results and communicating results to the patient/family, discussing treatment and goals, answering patient's questions and coordinating care.  Cc:  Daniel Peng,  NP  Sharene Butters 03/06/2022 1:06 PM   Cc:  Daniel Peng, NP Sharene Butters, PA-C

## 2022-03-06 NOTE — Patient Instructions (Addendum)
It was a pleasure to see you today at our office.   Recommendations: Continue 10 mg  1 tablet  twice daily.  Monitor for side effects  Call with any questions or concerns.  Follow up in 6 months Neurocognitive testing  Make sure to discuss with your doctor possible ADD   RECOMMENDATIONS FOR ALL PATIENTS WITH MEMORY PROBLEMS: 1. Continue to exercise (Recommend 30 minutes of walking everyday, or 3 hours every week) 2. Increase social interactions - continue going to Church and enjoy social gatherings with friends and family 3. Eat healthy, avoid fried foods and eat more fruits and vegetables 4. Maintain adequate blood pressure, blood sugar, and blood cholesterol level. Reducing the risk of stroke and cardiovascular disease also helps promoting better memory. 5. Avoid stressful situations. Live a simple life and avoid aggravations. Organize your time and prepare for the next day in anticipation. 6. Sleep well, avoid any interruptions of sleep and avoid any distractions in the bedroom that may interfere with adequate sleep quality 7. Avoid sugar, avoid sweets as there is a strong link between excessive sugar intake, diabetes, and cognitive impairment We discussed the Mediterranean diet, which has been shown to help patients reduce the risk of progressive memory disorders and reduces cardiovascular risk. This includes eating fish, eat fruits and green leafy vegetables, nuts like almonds and hazelnuts, walnuts, and also use olive oil. Avoid fast foods and fried foods as much as possible. Avoid sweets and sugar as sugar use has been linked to worsening of memory function.  There is always a concern of gradual progression of memory problems. If this is the case, then we may need to adjust level of care according to patient needs. Support, both to the patient and caregiver, should then be put into place.     FALL PRECAUTIONS: Be cautious when walking. Scan the area for obstacles that may increase the  risk of trips and falls. When getting up in the mornings, sit up at the edge of the bed for a few minutes before getting out of bed. Consider elevating the bed at the head end to avoid drop of blood pressure when getting up. Walk always in a well-lit room (use night lights in the walls). Avoid area rugs or power cords from appliances in the middle of the walkways. Use a walker or a cane if necessary and consider physical therapy for balance exercise. Get your eyesight checked regularly.  FINANCIAL OVERSIGHT: Supervision, especially oversight when making financial decisions or transactions is also recommended.  HOME SAFETY: Consider the safety of the kitchen when operating appliances like stoves, microwave oven, and blender. Consider having supervision and share cooking responsibilities until no longer able to participate in those. Accidents with firearms and other hazards in the house should be identified and addressed as well.   ABILITY TO BE LEFT ALONE: If patient is unable to contact 911 operator, consider using LifeLine, or when the need is there, arrange for someone to stay with patients. Smoking is a fire hazard, consider supervision or cessation. Risk of wandering should be assessed by caregiver and if detected at any point, supervision and safe proof recommendations should be instituted.  MEDICATION SUPERVISION: Inability to self-administer medication needs to be constantly addressed. Implement a mechanism to ensure safe administration of the medications.   DRIVING: Regarding driving, in patients with progressive memory problems, driving will be impaired. We advise to have someone else do the driving if trouble finding directions or if minor accidents are reported. Independent driving   assessment is available to determine safety of driving.   If you are interested in the driving assessment, you can contact the following:  [employer] in [place] [phone]  [facility] [phone]  [facility] [phone]  [facility] [phone] or [phone]    Mediterranean Diet A Mediterranean diet refers to food and lifestyle choices that are based on the traditions of countries located on the [place] Sea. This way of eating has been shown to help prevent certain conditions and improve outcomes for people who have chronic diseases, like kidney disease and heart disease. What are tips for following this plan? Lifestyle  Cook and eat meals together with your family, when possible. Drink enough fluid to keep your urine clear or pale yellow. Be physically active every day. This includes: Aerobic exercise like running or swimming. Leisure activities like gardening, walking, or housework. Get 7-8 hours of sleep each night. If recommended by your health care provider, drink red wine in moderation. This means 1 glass a day for nonpregnant women and 2 glasses a day for men. A glass of wine equals 5 oz (150 mL). Reading food labels  Check the serving size of packaged foods. For foods such as rice and pasta, the serving size refers to the amount of cooked product, not dry. Check the total fat in packaged foods. Avoid foods that have saturated fat or trans fats. Check the ingredients list for added sugars, such as corn syrup. Shopping  At the grocery store, buy most of your food from the areas near the walls of the store. This includes: Fresh fruits and vegetables (produce). Grains, beans, nuts, and seeds. Some of these may be available in unpackaged forms or large amounts (in bulk). Fresh seafood. Poultry and eggs. Low-fat dairy products. Buy whole ingredients instead of prepackaged foods. Buy fresh fruits and vegetables in-season from local farmers markets. Buy frozen fruits and vegetables in resealable bags. If you do not have access to quality fresh seafood, buy precooked frozen shrimp or canned fish, such as tuna,  salmon, or sardines. Buy small amounts of raw or cooked vegetables, salads, or olives from the deli or salad bar at your store. Stock your pantry so you always have certain foods on hand, such as olive oil, canned tuna, canned tomatoes, rice, pasta, and beans. Cooking  Cook foods with extra-virgin olive oil instead of using butter or other vegetable oils. Have meat as a side dish, and have vegetables or grains as your main dish. This means having meat in small portions or adding small amounts of meat to foods like pasta or stew. Use beans or vegetables instead of meat in common dishes like chili or lasagna. Experiment with different cooking methods. Try roasting or broiling vegetables instead of steaming or sauteing them. Add frozen vegetables to soups, stews, pasta, or rice. Add nuts or seeds for added healthy fat at each meal. You can add these to yogurt, salads, or vegetable dishes. Marinate fish or vegetables using olive oil, lemon juice, garlic, and fresh herbs. Meal planning  Plan to eat 1 vegetarian meal one day each week. Try to work up to 2 vegetarian meals, if possible. Eat seafood 2 or more times a week. Have healthy snacks readily available, such as: Vegetable sticks with hummus. Greek yogurt. Fruit and nut trail mix. Eat balanced meals throughout the week. This includes: Fruit: 2-3 servings a day Vegetables: 4-5 servings a day Low-fat dairy: 2 servings a day Fish, poultry, or lean meat:   1 serving a day Beans and legumes: 2 or more servings a week Nuts and seeds: 1-2 servings a day Whole grains: 6-8 servings a day Extra-virgin olive oil: 3-4 servings a day Limit red meat and sweets to only a few servings a month What are my food choices? Mediterranean diet Recommended Grains: Whole-grain pasta. Brown rice. Bulgar wheat. Polenta. Couscous. Whole-wheat bread. Oatmeal. Quinoa. Vegetables: Artichokes. Beets. Broccoli. Cabbage. Carrots. Eggplant. Green beans. Chard. Kale.  Spinach. Onions. Leeks. Peas. Squash. Tomatoes. Peppers. Radishes. Fruits: Apples. Apricots. Avocado. Berries. Bananas. Cherries. Dates. Figs. Grapes. Lemons. Melon. Oranges. Peaches. Plums. Pomegranate. Meats and other protein foods: Beans. Almonds. Sunflower seeds. Pine nuts. Peanuts. Cod. Salmon. Scallops. Shrimp. Tuna. Tilapia. Clams. Oysters. Eggs. Dairy: Low-fat milk. Cheese. Greek yogurt. Beverages: Water. Red wine. Herbal tea. Fats and oils: Extra virgin olive oil. Avocado oil. Grape seed oil. Sweets and desserts: Greek yogurt with honey. Baked apples. Poached pears. Trail mix. Seasoning and other foods: Basil. Cilantro. Coriander. Cumin. Mint. Parsley. Sage. Rosemary. Tarragon. Garlic. Oregano. Thyme. Pepper. Balsalmic vinegar. Tahini. Hummus. Tomato sauce. Olives. Mushrooms. Limit these Grains: Prepackaged pasta or rice dishes. Prepackaged cereal with added sugar. Vegetables: Deep fried potatoes (french fries). Fruits: Fruit canned in syrup. Meats and other protein foods: Beef. Pork. Lamb. Poultry with skin. Hot dogs. Bacon. Dairy: Ice cream. Sour cream. Whole milk. Beverages: Juice. Sugar-sweetened soft drinks. Beer. Liquor and spirits. Fats and oils: Butter. Canola oil. Vegetable oil. Beef fat (tallow). Lard. Sweets and desserts: Cookies. Cakes. Pies. Candy. Seasoning and other foods: Mayonnaise. Premade sauces and marinades. The items listed may not be a complete list. Talk with your dietitian about what dietary choices are right for you. Summary The Mediterranean diet includes both food and lifestyle choices. Eat a variety of fresh fruits and vegetables, beans, nuts, seeds, and whole grains. Limit the amount of red meat and sweets that you eat. Talk with your health care provider about whether it is safe for you to drink red wine in moderation. This means 1 glass a day for nonpregnant women and 2 glasses a day for men. A glass of wine equals 5 oz (150 mL). This information is  not intended to replace advice given to you by your health care provider. Make sure you discuss any questions you have with your health care provider. Document Released: 03/02/2016 Document Revised: 04/04/2016 Document Reviewed: 03/02/2016 Elsevier Interactive Patient Education  2017 [employer]     

## 2022-03-31 DIAGNOSIS — Z23 Encounter for immunization: Secondary | ICD-10-CM | POA: Diagnosis not present

## 2022-04-12 DIAGNOSIS — Z23 Encounter for immunization: Secondary | ICD-10-CM | POA: Diagnosis not present

## 2022-05-15 ENCOUNTER — Telehealth: Payer: Self-pay | Admitting: Adult Health

## 2022-05-15 NOTE — Telephone Encounter (Signed)
Refill tamsulosin (FLOMAX) 0.4 MG CAPS capsule  [facility] [place], [place] - [place] Phone:  [phone]  Fax:  [phone]

## 2022-05-16 ENCOUNTER — Encounter: Payer: Medicare Other | Admitting: Psychology

## 2022-05-16 ENCOUNTER — Other Ambulatory Visit: Payer: Self-pay

## 2022-05-16 MED ORDER — TAMSULOSIN HCL 0.4 MG PO CAPS: 0.8000 mg | ORAL_CAPSULE | Freq: Every day | ORAL | 0 refills | Status: DC

## 2022-05-16 NOTE — Telephone Encounter (Signed)
Tried to call pt to schedule CPE. Will send in 30 day refill.

## 2022-05-23 ENCOUNTER — Encounter: Payer: Medicare Other | Admitting: Psychology

## 2022-06-13 ENCOUNTER — Telehealth: Payer: Self-pay | Admitting: Adult Health

## 2022-06-13 MED ORDER — TAMSULOSIN HCL 0.4 MG PO CAPS: 0.8000 mg | ORAL_CAPSULE | Freq: Every day | ORAL | 0 refills | Status: DC

## 2022-06-13 NOTE — Telephone Encounter (Signed)
Pt has cpe sch for 07-11-2022 dutasteride (AVODART) 0.5 MG capsule and tamsulosin (FLOMAX) 0.4 MG CAPS capsule  [facility] [place], [place] - [place] Phone: [phone]  Fax: [phone]

## 2022-06-13 NOTE — Telephone Encounter (Signed)
Rx refilled.

## 2022-06-14 NOTE — Telephone Encounter (Signed)
Patient notified of update  and verbalized understanding. 

## 2022-07-11 ENCOUNTER — Encounter: Payer: Self-pay | Admitting: Adult Health

## 2022-07-11 ENCOUNTER — Ambulatory Visit (INDEPENDENT_AMBULATORY_CARE_PROVIDER_SITE_OTHER): Payer: Medicare Other | Admitting: Adult Health

## 2022-07-11 VITALS — BP 160/80 | HR 59 | Temp 98.1°F | Wt 207.0 lb

## 2022-07-11 DIAGNOSIS — N401 Enlarged prostate with lower urinary tract symptoms: Secondary | ICD-10-CM

## 2022-07-11 DIAGNOSIS — K219 Gastro-esophageal reflux disease without esophagitis: Secondary | ICD-10-CM

## 2022-07-11 DIAGNOSIS — I10 Essential (primary) hypertension: Secondary | ICD-10-CM

## 2022-07-11 DIAGNOSIS — F09 Unspecified mental disorder due to known physiological condition: Secondary | ICD-10-CM | POA: Diagnosis not present

## 2022-07-11 DIAGNOSIS — R351 Nocturia: Secondary | ICD-10-CM | POA: Diagnosis not present

## 2022-07-11 DIAGNOSIS — I714 Abdominal aortic aneurysm, without rupture, unspecified: Secondary | ICD-10-CM

## 2022-07-11 LAB — LIPID PANEL
Cholesterol: 154 mg/dL (ref 0–200)
HDL: 47.5 mg/dL (ref >39.00)
LDL Cholesterol: 78 mg/dL (ref 0–99)
NonHDL: 106.34
Total CHOL/HDL Ratio: 3
Triglycerides: 144 mg/dL (ref 0.0–149.0)
VLDL: 28.8 mg/dL (ref 0.0–40.0)

## 2022-07-11 LAB — URINALYSIS
Bilirubin Urine: NEGATIVE
Hgb urine dipstick: NEGATIVE
Ketones, ur: NEGATIVE
Leukocytes,Ua: NEGATIVE
Nitrite: NEGATIVE
Specific Gravity, Urine: 1.015 (ref 1.000–1.030)
Total Protein, Urine: NEGATIVE
Urine Glucose: NEGATIVE
Urobilinogen, UA: 0.2 (ref 0.0–1.0)
pH: 5.5 (ref 5.0–8.0)

## 2022-07-11 LAB — CBC WITH DIFFERENTIAL/PLATELET
Basophils Absolute: 0 10*3/uL (ref 0.0–0.1)
Basophils Relative: 0.3 % (ref 0.0–3.0)
Eosinophils Absolute: 0.1 10*3/uL (ref 0.0–0.7)
Eosinophils Relative: 0.9 % (ref 0.0–5.0)
HCT: 43 % (ref 39.0–52.0)
Hemoglobin: 15.1 g/dL (ref 13.0–17.0)
Lymphocytes Relative: 16.1 % (ref 12.0–46.0)
Lymphs Abs: 1.1 10*3/uL (ref 0.7–4.0)
MCHC: 35.2 g/dL (ref 30.0–36.0)
MCV: 96.6 fl (ref 78.0–100.0)
Monocytes Absolute: 0.6 10*3/uL (ref 0.1–1.0)
Monocytes Relative: 9 % (ref 3.0–12.0)
Neutro Abs: 5.1 10*3/uL (ref 1.4–7.7)
Neutrophils Relative %: 73.7 % (ref 43.0–77.0)
Platelets: 207 10*3/uL (ref 150.0–400.0)
RBC: 4.45 Mil/uL (ref 4.22–5.81)
RDW: 13 % (ref 11.5–15.5)
WBC: 7 10*3/uL (ref 4.0–10.5)

## 2022-07-11 LAB — COMPREHENSIVE METABOLIC PANEL
ALT: 14 U/L (ref 0–53)
AST: 46 U/L — ABNORMAL HIGH (ref 0–37)
Albumin: 4.3 g/dL (ref 3.5–5.2)
Alkaline Phosphatase: 71 U/L (ref 39–117)
BUN: 17 mg/dL (ref 6–23)
CO2: 26 mEq/L (ref 19–32)
Calcium: 9.1 mg/dL (ref 8.4–10.5)
Chloride: 102 mEq/L (ref 96–112)
Creatinine, Ser: 0.97 mg/dL (ref 0.40–1.50)
GFR: 75.79 mL/min (ref >60.00)
Glucose, Bld: 107 mg/dL — ABNORMAL HIGH (ref 70–99)
Potassium: 3.8 mEq/L (ref 3.5–5.1)
Sodium: 136 mEq/L (ref 135–145)
Total Bilirubin: 1.8 mg/dL — ABNORMAL HIGH (ref 0.2–1.2)
Total Protein: 6.4 g/dL (ref 6.0–8.3)

## 2022-07-11 LAB — TSH: TSH: 1.12 u[IU]/mL (ref 0.35–5.50)

## 2022-07-11 LAB — PSA: PSA: 1.11 ng/mL (ref 0.10–4.00)

## 2022-07-11 MED ORDER — TAMSULOSIN HCL 0.4 MG PO CAPS: 0.8000 mg | ORAL_CAPSULE | Freq: Every day | ORAL | 3 refills | Status: AC

## 2022-07-11 MED ORDER — AMLODIPINE BESYLATE 10 MG PO TABS: 10.0000 mg | ORAL_TABLET | Freq: Every day | ORAL | 3 refills | Status: DC

## 2022-07-11 MED ORDER — DUTASTERIDE 0.5 MG PO CAPS: 0.5000 mg | ORAL_CAPSULE | Freq: Every day | ORAL | 3 refills | Status: AC

## 2022-07-11 NOTE — Patient Instructions (Addendum)
It was great seeing you today   We will follow up with you regarding your lab work   Please let me know if you need anything   Please let me know if your blood pressure is not below 130/80 in a month

## 2022-07-11 NOTE — Progress Notes (Signed)
Subjective:    Patient ID: Daniel Guerrero, male    DOB: 07/17/1946, 76 y.o.   MRN: [id number]  HPI Patient presents for yearly preventative medicine examination. He is a pleasant 76 year old male who  has a past medical history of Allergy, BPH (benign prostatic hyperplasia), Cataract of both eyes, Eczema, Enlarged aorta (HCC), Erectile dysfunction, Hearing loss, History of inguinal hernia repair, bilateral, Hypertension, Kidney stones, Loss of hearing, Meniere disease, Paroxysmal SVT (supraventricular tachycardia), Psoriasis, S/P appendectomy, S/P tonsillectomy, Seasonal allergies, and SVT (supraventricular tachycardia).  Essential hypertension-managed with Norvasc 5 mg daily.  He denies chest pain, shortness of breath, dizziness, lightheadedness, or syncopal episodes. He has been out of his blood pressure medication for the last few weeks.   BP Readings from Last 3 Encounters:  07/11/22 (!) 160/80  03/06/22 (!) 155/88  09/05/21 (!) 151/84   BPH with nocturia-managed with Flomax and Avodart.  GERD-managed with Prevacid 30 mg daily  History of AAA-followed by Dr. Donnetta Hutching.  CT of the abdomen was done in September 2022 which showed stable dilation of aortic root noted at 4.3 cm.  Mild Cognitive Impairment-managed by neurology.  Was last seen in August 2023 at which time his MMSE was 24 out of 30 with delayed recall 2 out of 3.  Previous MMSE was done in July 2022 and the score was 27.  He continues to be on Namenda 10 mg twice a day. He is not getting lost while driving. Continues to do his ADL's. Dates and times are difficult for him. He feels as though he is stable. Does not have any issues with long term memory.    All immunizations and health maintenance protocols were reviewed with the patient and needed orders were placed.  Appropriate screening laboratory values were ordered for the patient including screening of hyperlipidemia, renal function and hepatic function. If indicated by BPH,  a PSA was ordered.  Medication reconciliation,  past medical history, social history, problem list and allergies were reviewed in detail with the patient  Goals were established with regard to weight loss, exercise, and  diet in compliance with medications Wt Readings from Last 3 Encounters:  07/11/22 207 lb (93.9 kg)  03/06/22 199 lb (90.3 kg)  12/13/21 190 lb (86.2 kg)   Review of Systems  Constitutional: Negative.   HENT: Negative.    Eyes: Negative.   Respiratory: Negative.    Cardiovascular: Negative.   Gastrointestinal: Negative.   Endocrine: Negative.   Genitourinary: Negative.   Musculoskeletal: Negative.   Skin: Negative.   Allergic/Immunologic: Negative.   Neurological: Negative.   Hematological: Negative.   Psychiatric/Behavioral:  Positive for confusion.   All other systems reviewed and are negative.  Past Medical History:  Diagnosis Date   Allergy    BPH (benign prostatic hyperplasia)    Cataract of both eyes    implants both eyes    Eczema    Enlarged aorta (HCC)    Erectile dysfunction    Hearing loss    high frequency   History of inguinal hernia repair, bilateral    Hypertension    Kidney stones    Loss of hearing    high frequency   Meniere disease    Paroxysmal SVT (supraventricular tachycardia)    Psoriasis    S/P appendectomy    S/P tonsillectomy    Seasonal allergies    SVT (supraventricular tachycardia)     Social History   Socioeconomic History   Marital status: Married  Spouse name: Not on file   Number of children: 3   Years of education: 16   Highest education level: Bachelor's degree (e.g., BA, AB, BS)  Occupational History   Occupation: professor    Comment: retired  Tobacco Use   Smoking status: Former    Types: Cigarettes    Quit date: 07/24/1964    Years since quitting: 58.0   Smokeless tobacco: Never   Tobacco comments:    Quit 20-30 years ago; smoked pipe and cigars   Vaping Use   Vaping Use: Never used   Substance and Sexual Activity   Alcohol use: Yes    Alcohol/week: 2.0 standard drinks of alcohol    Types: 2 Cans of beer per week    Comment: 2 per day   Drug use: No   Sexual activity: Not on file  Other Topics Concern   Not on file  Social History Narrative   Daily Caffeine use: 5-6 drinks daily      08/07/2018: No drug use, some daily alcohol use at night but patient does not feel like it disrupts his health or is a safety concern.       Lives with wife in two story home; three sons, five grandchildren in [place] area.   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    Right handed            Social Determinants of Health   Financial Resource Strain: Low Risk  (09/20/2021)   Overall Financial Resource Strain (CARDIA)    Difficulty of Paying Living Expenses: Not hard at all  Food Insecurity: No Food Insecurity (09/20/2021)   Hunger Vital Sign    Worried About Running Out of Food in the Last Year: Never true    Ran Out of Food in the Last Year: Never true  Transportation Needs: No Transportation Needs (09/20/2021)   PRAPARE - Hydrologist (Medical): No    Lack of Transportation (Non-Medical): No  Physical Activity: Sufficiently Active (09/20/2021)   Exercise Vital Sign    Days of Exercise per Week: 4 days    Minutes of Exercise per Session: 60 min  Stress: No Stress Concern Present (09/20/2021)   [place]    Feeling of Stress : Not at all  Social Connections: [place] (09/20/2021)   Social Connection and Isolation Panel [NHANES]    Frequency of Communication with Friends and Family: More than three times a week    Frequency of Social Gatherings with Friends and Family: More than three times a week    Attends Religious Services: More than 4 times per year    Active Member of [employer] or Organizations: Yes     Attends Music therapist: More than 4 times per year    Marital Status: Married  Human resources officer Violence: Not At Risk (09/20/2021)   Humiliation, Afraid, Rape, and Kick questionnaire    Fear of Current or Ex-Partner: No    Emotionally Abused: No    Physically Abused: No    Sexually Abused: No    Past Surgical History:  Procedure Laterality Date   Ablasion     APPENDECTOMY     CARDIAC ELECTROPHYSIOLOGY MAPPING AND ABLATION  2015   CHOLECYSTECTOMY     COLONOSCOPY     unsure where or when- states greater than 10 yrs ago per pt- was normal per pt and  his wife    COLONOSCOPY WITH PROPOFOL N/A 08/26/2018   two tubular adenomas,  diverticulosis.  Repeat colonoscopy in February 2025 if overall health permits.   CYSTOSCOPY WITH RETROGRADE PYELOGRAM, URETEROSCOPY AND STENT PLACEMENT Left 12/30/2014   Procedure: CYSTOSCOPY WITH LEFT URETEROSCOPY STONE EXTRACTION WITH STENT;  Surgeon: Franchot Gallo, MD;  Location: [facility];  Service: Urology;  Laterality: Left;   ELECTROPHYSIOLOGIC STUDY N/A 08/24/2015   Procedure: SVT Ablation;  Surgeon: Thompson Grayer, MD;  Location: [facility];  Service: Cardiovascular;  Laterality: N/A;   ENDOVENOUS ABLATION SAPHENOUS VEIN W/ LASER Right 12/04/2016   endovenous laser ablation right greater saphenous vein by Tinnie Gens MD    ESOPHAGOGASTRODUODENOSCOPY N/A 08/18/2016   Procedure: ESOPHAGOGASTRODUODENOSCOPY (EGD);  Surgeon: [name], MD;  Location: [facility];  Service: Endoscopy;  Laterality: N/A;   ESOPHAGOGASTRODUODENOSCOPY N/A 09/06/2016   Procedure: ESOPHAGOGASTRODUODENOSCOPY (EGD);  Surgeon: [name], MD;  Location: [facility];  Service: Endoscopy;  Laterality: N/A;  100 - moved to 2/14 @ 2:15 per Tretha Sciara   ESOPHAGOGASTRODUODENOSCOPY (EGD) WITH PROPOFOL N/A 08/26/2018   Mild Schatzki ring status post dilation, small hiatal hernia.   EYE SURGERY     Catracts removed both eye 30 yrs ago   [place]     right  and left   HOLMIUM LASER APPLICATION Left 44/09/4740   Procedure: HOLMIUM LASER APPLICATION;  Surgeon: Franchot Gallo, MD;  Location: [facility];  Service: Urology;  Laterality: Left;   MALONEY DILATION N/A 09/06/2016   Procedure: Venia Minks DILATION;  Surgeon: [name], MD;  Location: [facility];  Service: Endoscopy;  Laterality: N/A;   MALONEY DILATION N/A 08/26/2018   Procedure: Venia Minks DILATION;  Surgeon: [name], MD;  [facility]: [facility];  Service: Endoscopy;  Laterality: N/A;   POLYPECTOMY  08/26/2018   Procedure: POLYPECTOMY;  Surgeon: [name], MD;  Location: [facility];  Service: Endoscopy;;  polyp at descending colon x2   TONSILLECTOMY      Family History  Problem Relation Age of Onset   Cancer Mother        lung   Thyroid disease Mother    Diabetes Father    GI Bleed Father    Aneurysm Father    AAA (abdominal aortic aneurysm) Father    AAA (abdominal aortic aneurysm) Brother    Colon cancer Neg Hx    Colon polyps Neg Hx    Rectal cancer Neg Hx    Stomach cancer Neg Hx     Allergies  Allergen Reactions   Peanut-Containing Drug Products Hives   Penicillins Hives    Has patient had a PCN reaction causing immediate rash, facial/tongue/throat swelling, SOB or lightheadedness with hypotension: No Has patient had a PCN reaction causing severe rash involving mucus membranes or skin necrosis: No Has patient had a PCN reaction that required hospitalization No Has patient had a PCN reaction occurring within the last 10 years: No If all of the above answers are "NO", then may proceed with Cephalosporin use.   Quinolones     Patient was warned about not using Cipro and similar antibiotics. Recent studies have raised concern that fluoroquinolone antibiotics could be associated with an increased risk of aortic aneurysm Fluoroquinolones have non-antimicrobial properties that might jeopardise the integrity of the extracellular matrix of the vascular  wall In a  propensity score matched cohort study in [place], there was a 66% increased rate of aortic aneurysm or dissection associated with  oral fluoroquinolone use, compared wit    Current Outpatient Medications on File Prior to Visit  Medication Sig Dispense Refill   amLODipine (NORVASC) 5 MG tablet TAKE 1 TABLET BY MOUTH EVERY DAY 90 tablet 3   cetirizine (ZYRTEC) 10 MG tablet Take 10 mg by mouth daily as needed for allergies.      CRANBERRY POWDER PO Take by mouth. 15,000 UNITS as needed     Cyanocobalamin (VITAMIN B-12 PO) Take 1 tablet by mouth daily.      desonide (DESOWEN) 0.05 % cream Apply 1 application topically daily as needed (rash).      diphenhydrAMINE HCl (BENADRYL ALLERGY PO) Take 1 tablet by mouth daily.     dutasteride (AVODART) 0.5 MG capsule Take 1 capsule (0.5 mg total) by mouth daily. 90 capsule 3   EPINEPHrine 0.3 mg/0.3 mL IJ SOAJ injection Inject 0.3 mLs (0.3 mg total) into the muscle as needed for anaphylaxis. 1 each 1   fluticasone (FLONASE) 50 MCG/ACT nasal spray INSTILL TWO SPRAYS IN EACH NOSTRIL DAILY AS NEEDED FOR ALLERGIES 48 g 1   hydrocortisone cream 1 % Apply 1 application topically daily as needed for itching.     lansoprazole (PREVACID) 30 MG capsule TAKE 1 CAPSULE BY MOUTH DAILY BEFORE BREAKFAST 90 capsule 3   loperamide (IMODIUM) 2 MG capsule Take by mouth as needed for diarrhea or loose stools. Patient takes every 2-3 days.     memantine (NAMENDA) 10 MG tablet Take 1 tablet (10 mg)  twice a day 60 tablet 11   Multiple Vitamin (ONE DAILY) tablet Take by mouth.     Simethicone (GAS-X PO) Take 1 tablet by mouth daily as needed.      tamsulosin (FLOMAX) 0.4 MG CAPS capsule Take 2 capsules (0.8 mg total) by mouth daily. 60 capsule 0   vitamin C (ASCORBIC ACID) 500 MG tablet Take 500 mg by mouth daily.     No current facility-administered medications on file prior to visit.    BP (!) 160/80   Pulse (!) 59   Temp 98.1 F (36.7 C) (Oral)   Wt 207 lb  (93.9 kg)   SpO2 95%   BMI 30.57 kg/m       Objective:   Physical Exam Vitals and nursing note reviewed.  Constitutional:      General: He is not in acute distress.    Appearance: Normal appearance. He is well-developed and normal weight.  HENT:     Head: Normocephalic and atraumatic.     Right Ear: Tympanic membrane, ear canal and external ear normal. There is no impacted cerumen.     Left Ear: Tympanic membrane, ear canal and external ear normal. There is no impacted cerumen.     Nose: Nose normal. No congestion or rhinorrhea.     Mouth/Throat:     Mouth: Mucous membranes are moist.     Pharynx: Oropharynx is clear. No oropharyngeal exudate or posterior oropharyngeal erythema.  Eyes:     General:        Right eye: No discharge.        Left eye: No discharge.     Extraocular Movements: Extraocular movements intact.     Conjunctiva/sclera: Conjunctivae normal.     Pupils: Pupils are equal, round, and reactive to light.  Neck:     Vascular: No carotid bruit.     Trachea: No tracheal deviation.  Cardiovascular:     Rate and Rhythm: Normal rate and regular rhythm.  Pulses: Normal pulses.     Heart sounds: Normal heart sounds. No murmur heard.    No friction rub. No gallop.  Pulmonary:     Effort: Pulmonary effort is normal. No respiratory distress.     Breath sounds: Normal breath sounds. No stridor. No wheezing, rhonchi or rales.  Chest:     Chest wall: No tenderness.  Abdominal:     General: Abdomen is flat. Bowel sounds are normal. There is no distension.     Palpations: Abdomen is soft. There is no mass.     Tenderness: There is no abdominal tenderness. There is no right CVA tenderness, left CVA tenderness, guarding or rebound.     Hernia: No hernia is present.  Musculoskeletal:        General: No swelling, tenderness, deformity or signs of injury. Normal range of motion.     Right lower leg: No edema.     Left lower leg: No edema.  Lymphadenopathy:      Cervical: No cervical adenopathy.  Skin:    General: Skin is warm and dry.     Capillary Refill: Capillary refill takes less than 2 seconds.     Coloration: Skin is not jaundiced or pale.     Findings: No bruising, erythema, lesion or rash.  Neurological:     General: No focal deficit present.     Mental Status: He is alert and oriented to person, place, and time.     Cranial Nerves: No cranial nerve deficit.     Sensory: No sensory deficit.     Motor: No weakness.     Coordination: Coordination normal.     Gait: Gait normal.     Deep Tendon Reflexes: Reflexes normal.  Psychiatric:        Mood and Affect: Mood normal.        Behavior: Behavior normal.        Thought Content: Thought content normal.        Judgment: Judgment normal.       Assessment & Plan:  1. Essential hypertension - Elevated today  - Will increase Norvasc to  10 mg. - He has a BP cuff at home and will follow up if not at goal of below 563 systolic in a month - Continue to exercise.  - CBC with Differential/Platelet; Future - Comprehensive metabolic panel; Future - Lipid panel; Future - TSH; Future - Urinalysis; Future  2. Benign prostatic hyperplasia with nocturia  - PSA; Future - dutasteride (AVODART) 0.5 MG capsule; Take 1 capsule (0.5 mg total) by mouth daily.  Dispense: 90 capsule; Refill: 3 - tamsulosin (FLOMAX) 0.4 MG CAPS capsule; Take 2 capsules (0.8 mg total) by mouth daily.  Dispense: 180 capsule; Refill: 3  3. Gastroesophageal reflux disease, unspecified whether esophagitis present - Continue Pravacid  - CBC with Differential/Platelet; Future - Comprehensive metabolic panel; Future - Lipid panel; Future - TSH; Future  4. Abdominal aortic aneurysm (AAA) without rupture, unspecified part ([place]) - Continue yearly monitoring  - CBC with Differential/Platelet; Future - Comprehensive metabolic panel; Future - Lipid panel; Future - TSH; Future  5. Mild cognitive disorder - Continue  Namenda - Follow up with Neurology as directed  - CBC with Differential/Platelet; Future - Comprehensive metabolic panel; Future - Lipid panel; Future - TSH; Future   .Dorothyann Peng, NP

## 2022-08-22 ENCOUNTER — Other Ambulatory Visit: Payer: Self-pay | Admitting: Thoracic Surgery (Cardiothoracic Vascular Surgery)

## 2022-08-22 DIAGNOSIS — I712 Thoracic aortic aneurysm, without rupture, unspecified: Secondary | ICD-10-CM

## 2022-09-07 ENCOUNTER — Encounter: Payer: Self-pay | Admitting: Physician Assistant

## 2022-09-07 ENCOUNTER — Ambulatory Visit (INDEPENDENT_AMBULATORY_CARE_PROVIDER_SITE_OTHER): Payer: Medicare Other | Admitting: Physician Assistant

## 2022-09-07 VITALS — BP 146/76 | HR 57 | Resp 18 | Ht 69.0 in | Wt 209.0 lb

## 2022-09-07 DIAGNOSIS — F09 Unspecified mental disorder due to known physiological condition: Secondary | ICD-10-CM | POA: Diagnosis not present

## 2022-09-07 NOTE — Progress Notes (Signed)
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 Patient is on memantine 10 mg twice daily. MMSE is stable at 28/30    Recommendations:   Follow up in 6  months. Continue memantine 10 mg twice daily, side effects discussed. Continue good control of cardiovascular risk factors.  The patient has a history of thoracic aortic aneurysm, and this is followed by thoracic surgeons. Patient is scheduled for neurocognitive testing on 10/2022  for clarity of the diagnosis and disease trajectory, as well as to determine other sources contributing to memory loss, possible ADD and dyslexia (per pt report )  Discontinue alcohol intake Monitor driving Continue to control mood as per PCP.    Subjective:   This patient is accompanied in the office by his wife  who supplements the history. Previous records as well as any outside records available were reviewed prior to todays visit.   Patient was last seen on 03/06/2022, at which time his MMSE was 24/30.    Any changes in memory since last visit?  "Not getting better" He likes to play computer games, Sherian Maroon, crossword puzzles, Chief Strategy Officer. and word finding.  Long-term memory is good. repeats oneself?  Endorsed.  Disoriented when walking into a room?  Patient denies Leaving objects in unusual places?  Patient denies   Wandering behavior?   denies   Any personality changes since last visit?  denies   Any worsening depression?: denies   Hallucinations or paranoia?  denies    Seizures?   denies    Any sleep changes?  "I sleep ok"  Denies vivid dreams, REM behavior or sleepwalking   Sleep apnea?   denies   Any hygiene concerns?    denies   Independent of bathing and dressing?  Endorsed  Does the patient needs help with medications? Patient is in charge, he sets them weekly  Who is in charge of the finances?  Wife is in charge (always, she is an Optometrist)    Any changes in appetite?  denies    Patient have trouble swallowing?  denies   Does the patient cook?  "He chops onions and dries dishes"  Any kitchen accidents such as leaving the stove on?   denies   Any headaches?    He has a history of migraines, about once a month. Vision changes? denies Chronic back pain  denies   Ambulates with difficulty?    He likes to walk with his wife, he also uses a stationary bike and a treadmill. Recent falls or head injuries?  denies     Unilateral weakness, numbness or tingling?   denies   Any tremors?  denies   Any anosmia?    denies   Any incontinence of urine?  "I take a pill, it is getting slow". Seeing a urologist soon Any bowel dysfunction?  He has a history of intermittent, chronic diarrhea depending on the food choices.  This is due to gallbladder disease. He continues to drink but decreased to 1 beer a day  Patient lives  with wife Does the patient drive?Endorsed, but he depends constantly of his GPS, without it he may feel lost.     Initial visit 03/02/21 the patient is seen in neurologic consultation at  the request of Dorothyann Peng, NP for the evaluation of memory.  The patient is accompanied by wife [name] who supplements the history. He is a very pleasant 77 year old RH man retired professor, who began to experience memory issues about 1 year ago.  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  He states that recalling names and numbers became more difficult, even numbers that he had dialed frequently.  "I have been always bad with dates, so I was not too worried when that all I could not remember "    His mood has been good, without depression or irritability.   He likes to enjoy his retirement, working on his yard, walking with his wife, and using his stationary bicycle.  He still would like to go back to teaching if he could.  He falls asleep well at night, but because of frequent urination he wakes up about 3 times to go to the bathroom, which interferes with his sleep.  He has begun taking Flomax which seems to help somehow.  He denies any vivid dreams or sleepwalking.  He denies hallucinations, paranoia, or leaving objects in unusual places.   He is independent of bathing and dressing.  Occasionally, he forgets to take a dose of medication.  He tries to set then in front of the breakfast table in a pillbox and his wife monitors closely.  She also manages the finances, she is an Optometrist, and has been doing so "forever ".  His appetite is good, but when he was having worse diarrhea, had lost a significant amount of weight, using this opportunity to  eat better and stay within the 180 to 190 pound range.  He denies trouble swallowing.  He never cooks, his wife always did the cooking.   He ambulates without difficulty without the use of a walker or a cane.  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  He drives and uses his GPS, without getting lost  -he depends so much of his GPS, but at times he may feel lost without it.  He denies any recent headaches -He has a history of migraines, but they are not life limiting with a frequency of about once a month.  He denies any falls, head injuries, double vision, dizziness, vertigo, focal numbness or tingling, unilateral weakness or tremors, denies anosmia, never had COVID.  He denies a history of sleep apnea, alcohol or tobacco abuse.  Family history negative for dementia.     Labs  02/01/21   TSH nl 1.42 B12  nl 539 UA neg CBC normal      MRI brain 02/16/21  No evidence of acute intracranial abnormality.   Mild chronic small vessel ischemic changes within the cerebral white matter and pons, similar as compared to the brain MRI of 01/25/2018. 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  Mild generalized cerebral and cerebellar atrophy.  Mild bilateral ethmoid and maxillary sinus mucosal thickening    Past Medical History:  Diagnosis Date   Allergy    BPH (benign prostatic hyperplasia)    Cataract of both eyes    implants both eyes    Eczema    Enlarged aorta (HCC)    Erectile dysfunction    Hearing loss  high frequency   History of inguinal hernia repair, bilateral    Hypertension    Kidney stones    Loss of hearing    high frequency   Meniere disease    Paroxysmal SVT (supraventricular tachycardia)    Psoriasis    S/P appendectomy    S/P tonsillectomy    Seasonal allergies    SVT (supraventricular tachycardia)      Past Surgical History:  Procedure Laterality Date   Ablasion     APPENDECTOMY     CARDIAC ELECTROPHYSIOLOGY MAPPING AND ABLATION  2015   CHOLECYSTECTOMY     COLONOSCOPY     unsure where or when- states greater than 10 yrs ago per pt- was normal per pt and his wife    COLONOSCOPY WITH PROPOFOL N/A 08/26/2018   two tubular adenomas,  diverticulosis.  Repeat colonoscopy in February 2025 if overall health permits.   CYSTOSCOPY WITH RETROGRADE PYELOGRAM, URETEROSCOPY AND STENT PLACEMENT Left 12/30/2014   Procedure: CYSTOSCOPY WITH LEFT URETEROSCOPY STONE EXTRACTION WITH STENT;  Surgeon: Franchot Gallo, MD;  Location: [facility];  Service: Urology;  Laterality: Left;   ELECTROPHYSIOLOGIC STUDY N/A 08/24/2015   Procedure: SVT Ablation;  Surgeon: Thompson Grayer, MD;  Location: [facility];  Service: Cardiovascular;  Laterality: N/A;   ENDOVENOUS ABLATION SAPHENOUS VEIN W/ LASER Right 12/04/2016   endovenous laser ablation right greater saphenous vein  by Tinnie Gens MD    ESOPHAGOGASTRODUODENOSCOPY N/A 08/18/2016   Procedure: ESOPHAGOGASTRODUODENOSCOPY (EGD);  Surgeon: [name], MD;  Location: [facility];  Service: Endoscopy;  Laterality: N/A;   ESOPHAGOGASTRODUODENOSCOPY N/A 09/06/2016   Procedure: ESOPHAGOGASTRODUODENOSCOPY (EGD);  Surgeon: [name], MD;  Location: [facility];  Service: Endoscopy;  Laterality: N/A;  100 - moved to 2/14 @ 2:15 per Tretha Sciara   ESOPHAGOGASTRODUODENOSCOPY (EGD) WITH PROPOFOL N/A 08/26/2018   Mild Schatzki ring status post dilation, small hiatal hernia.   EYE SURGERY     Catracts removed both eye 30 yrs ago   [place]     right and left   HOLMIUM LASER APPLICATION Left [id number]   Procedure: HOLMIUM LASER APPLICATION;  Surgeon: Franchot Gallo, MD;  Location: [facility];  Service: Urology;  Laterality: Left;   MALONEY DILATION N/A 09/06/2016   Procedure: Venia Minks DILATION;  Surgeon: [name], MD;  [facility]: [facility];  Service: Endoscopy;  Laterality: N/A;   MALONEY DILATION N/A 08/26/2018   Procedure: Venia Minks DILATION;  Surgeon: [name], MD;  [facility]: [facility];  Service: Endoscopy;  Laterality: N/A;   POLYPECTOMY  08/26/2018   Procedure: POLYPECTOMY;  Surgeon: [name], MD;  Location: [facility];  Service: Endoscopy;;  polyp at descending colon x2   TONSILLECTOMY       PREVIOUS MEDICATIONS:   CURRENT MEDICATIONS:  Outpatient Encounter Medications as of 09/07/2022  Medication Sig   amLODipine (NORVASC) 10 MG tablet Take 1 tablet (10 mg total) by mouth daily.   cetirizine (ZYRTEC) 10 MG tablet Take 10 mg by mouth daily as needed for allergies.    CRANBERRY POWDER PO Take by mouth. 15,000 UNITS as needed   Cyanocobalamin (VITAMIN B-12 PO) Take 1 tablet by mouth daily.    desonide (DESOWEN) 0.05 % cream Apply 1 application topically daily as needed (rash).    diphenhydrAMINE HCl (BENADRYL ALLERGY PO) Take 1 tablet by mouth daily.   dutasteride  (AVODART) 0.5 MG capsule Take 1 capsule (0.5 mg total) by mouth daily.   EPINEPHrine 0.3  mg/0.3 mL IJ SOAJ injection Inject 0.3 mLs (0.3 mg total) into the muscle as needed for anaphylaxis.   fluticasone (FLONASE) 50 MCG/ACT nasal spray INSTILL TWO SPRAYS IN EACH NOSTRIL DAILY AS NEEDED FOR ALLERGIES   hydrocortisone cream 1 % Apply 1 application topically daily as needed for itching.   lansoprazole (PREVACID) 30 MG capsule TAKE 1 CAPSULE BY MOUTH DAILY BEFORE BREAKFAST   loperamide (IMODIUM) 2 MG capsule Take by mouth as needed for diarrhea or loose stools. Patient takes every 2-3 days.   memantine (NAMENDA) 10 MG tablet Take 1 tablet (10 mg)  twice a day   Multiple Vitamin (ONE DAILY) tablet Take by mouth.   Simethicone (GAS-X PO) Take 1 tablet by mouth daily as needed.    tamsulosin (FLOMAX) 0.4 MG CAPS capsule Take 2 capsules (0.8 mg total) by mouth daily.   vitamin C (ASCORBIC ACID) 500 MG tablet Take 500 mg by mouth daily.   No facility-administered encounter medications on file as of 09/07/2022.     Objective:     PHYSICAL EXAMINATION:    VITALS:   Vitals:   09/07/22 1042  BP: (!) 146/76  Pulse: (!) 57  Resp: 18  SpO2: 96%  Weight: 209 lb (94.8 kg)  Height: 5' 9"$  (1.753 m)    GEN:  The patient appears stated age and is in NAD. HEENT:  Normocephalic, atraumatic.   Neurological examination:  General: NAD, well-groomed, appears stated age. Orientation: The patient is alert. Oriented to person, place and to date Cranial nerves: There is good facial symmetry.The speech is fluent and clear. No aphasia or dysarthria. Fund of knowledge is appropriate. Recent memory impaired and remote memory is normal.  Attention and concentration are normal.  Able to name objects and repeat phrases.  Hearing is intact to conversational tone.   Delayed recall 2/3 Sensation: Sensation is intact to light touch throughout Motor: Strength is at least antigravity x4. Tremors: none  DTR's 2/4 in  UE/LE      03/02/2021    3:00 PM  Montreal Cognitive Assessment   Visuospatial/ Executive (0/5) 4  Naming (0/3) 2  Attention: Read list of digits (0/2) 2  Attention: Read list of letters (0/1) 1  Attention: Serial 7 subtraction starting at 100 (0/3) 3  Language: Repeat phrase (0/2) 2  Language : Fluency (0/1) 1  Abstraction (0/2) 1  Delayed Recall (0/5) 0  Orientation (0/6) 4  Total 20  Adjusted Score (based on education) 20       09/07/2022   12:00 PM 03/07/2022   12:00 PM 02/01/2021    5:45 PM  MMSE - Mini Mental State Exam  Orientation to time 5 2 5  $ Orientation to Place 5 5 5  $ Registration 3 3 3  $ Attention/ Calculation 4 4 4  $ Recall 2 2 1  $ Language- name 2 objects 2 2 2  $ Language- repeat 1 1 1  $ Language- follow 3 step command 3 3 3  $ Language- read & follow direction 1 1 1  $ Write a sentence 1 0 1  Copy design 1 1 1  $ Total score 28 24 27       $ Movement examination: Tone: There is normal tone in the UE/LE Abnormal movements:  no tremor.  No myoclonus.  No asterixis.   Coordination:  There is no decremation with RAM's. Normal finger to nose  Gait and Station: The patient has no difficulty arising out of a deep-seated chair without the use of the hands. The patient's stride  length is good.  Gait is cautious and narrow.   Thank you for allowing [place] the opportunity to participate in the care of this nice patient. Please do not hesitate to contact us for any questions or concerns.   Total time spent on today's visit was 25 minutes dedicated to this patient today, preparing to see patient, examining the patient, ordering tests and/or medications and counseling the patient, documenting clinical information in the EHR or other health record, independently interpreting results and communicating results to the patient/family, discussing treatment and goals, answering patient's questions and coordinating care.  Cc:  Dorothyann Peng, NP  Sharene Butters 09/07/2022 12:56 PM

## 2022-09-07 NOTE — Patient Instructions (Signed)
It was a pleasure to see you today at our office.   Recommendations: Continue 10 mg  1 tablet  twice daily.  Monitor for side effects  Call with any questions or concerns.  Follow up in 6 months Neurocognitive testing  Make sure to discuss with your doctor possible ADD   RECOMMENDATIONS FOR ALL PATIENTS WITH MEMORY PROBLEMS: 1. Continue to exercise (Recommend 30 minutes of walking everyday, or 3 hours every week) 2. Increase social interactions - continue going to [place] and enjoy social gatherings with friends and family 3. Eat healthy, avoid fried foods and eat more fruits and vegetables 4. Maintain adequate blood pressure, blood sugar, and blood cholesterol level. Reducing the risk of stroke and cardiovascular disease also helps promoting better memory. 5. Avoid stressful situations. Live a simple life and avoid aggravations. Organize your time and prepare for the next day in anticipation. 6. Sleep well, avoid any interruptions of sleep and avoid any distractions in the bedroom that may interfere with adequate sleep quality 7. Avoid sugar, avoid sweets as there is a strong link between excessive sugar intake, diabetes, and cognitive impairment We discussed the Mediterranean diet, which has been shown to help patients reduce the risk of progressive memory disorders and reduces cardiovascular risk. This includes eating fish, eat fruits and green leafy vegetables, nuts like almonds and hazelnuts, walnuts, and also use olive oil. Avoid fast foods and fried foods as much as possible. Avoid sweets and sugar as sugar use has been linked to worsening of memory function.  There is always a concern of gradual progression of memory problems. If this is the case, then we may need to adjust level of care according to patient needs. Support, both to the patient and caregiver, should then be put into place.     FALL PRECAUTIONS: Be cautious when walking. Scan the area for obstacles that may increase the  risk of trips and falls. When getting up in the mornings, sit up at the edge of the bed for a few minutes before getting out of bed. Consider elevating the bed at the head end to avoid drop of blood pressure when getting up. Walk always in a well-lit room (use night lights in the walls). Avoid area rugs or power cords from appliances in the middle of the walkways. Use a walker or a cane if necessary and consider physical therapy for balance exercise. Get your eyesight checked regularly.  FINANCIAL OVERSIGHT: Supervision, especially oversight when making financial decisions or transactions is also recommended.  HOME SAFETY: Consider the safety of the kitchen when operating appliances like stoves, microwave oven, and blender. Consider having supervision and share cooking responsibilities until no longer able to participate in those. Accidents with firearms and other hazards in the house should be identified and addressed as well.   ABILITY TO BE LEFT ALONE: If patient is unable to contact 911 operator, consider using LifeLine, or when the need is there, arrange for someone to stay with patients. Smoking is a fire hazard, consider supervision or cessation. Risk of wandering should be assessed by caregiver and if detected at any point, supervision and safe proof recommendations should be instituted.  MEDICATION SUPERVISION: Inability to self-administer medication needs to be constantly addressed. Implement a mechanism to ensure safe administration of the medications.   DRIVING: Regarding driving, in patients with progressive memory problems, driving will be impaired. We advise to have someone else do the driving if trouble finding directions or if minor accidents are reported. Independent driving  assessment is available to determine safety of driving.   If you are interested in the driving assessment, you can contact the following:  [employer] in [place]  Akins Maeser [phone] or [phone]    [place] refers to food and lifestyle choices that are based on the traditions of countries located on the [employer]. This way of eating has been shown to help prevent certain conditions and improve outcomes for people who have chronic diseases, like kidney disease and heart disease. What are tips for following this plan? Lifestyle  Cook and eat meals together with your family, when possible. Drink enough fluid to keep your urine clear or pale yellow. Be physically active every day. This includes: Aerobic exercise like running or swimming. Leisure activities like gardening, walking, or housework. Get 7-8 hours of sleep each night. If recommended by your health care provider, drink red wine in moderation. This means 1 glass a day for nonpregnant women and 2 glasses a day for men. A glass of wine equals 5 oz (150 mL). Reading food labels  Check the serving size of packaged foods. For foods such as rice and pasta, the serving size refers to the amount of cooked product, not dry. Check the total fat in packaged foods. Avoid foods that have saturated fat or trans fats. Check the ingredients list for added sugars, such as corn syrup. Shopping  At the grocery store, buy most of your food from the areas near the walls of the store. This includes: Fresh fruits and vegetables (produce). Grains, beans, nuts, and seeds. Some of these may be available in unpackaged forms or large amounts (in bulk). Fresh seafood. Poultry and eggs. Low-fat dairy products. Buy whole ingredients instead of prepackaged foods. Buy fresh fruits and vegetables in-season from local farmers markets. Buy frozen fruits and vegetables in resealable bags. If you do not have access to quality fresh seafood, buy precooked frozen shrimp or canned fish, such as tuna,  salmon, or sardines. Buy small amounts of raw or cooked vegetables, salads, or olives from the deli or salad bar at your store. Stock your pantry so you always have certain foods on hand, such as olive oil, canned tuna, canned tomatoes, rice, pasta, and beans. Cooking  Cook foods with extra-virgin olive oil instead of using butter or other vegetable oils. Have meat as a side dish, and have vegetables or grains as your main dish. This means having meat in small portions or adding small amounts of meat to foods like pasta or stew. Use beans or vegetables instead of meat in common dishes like chili or lasagna. Experiment with different cooking methods. Try roasting or broiling vegetables instead of steaming or sauteing them. Add frozen vegetables to soups, stews, pasta, or rice. Add nuts or seeds for added healthy fat at each meal. You can add these to yogurt, salads, or vegetable dishes. Marinate fish or vegetables using olive oil, lemon juice, garlic, and fresh herbs. Meal planning  Plan to eat 1 vegetarian meal one day each week. Try to work up to 2 vegetarian meals, if possible. Eat seafood 2 or more times a week. Have healthy snacks readily available, such as: Vegetable sticks with hummus. Greek yogurt. Fruit and nut trail mix. Eat balanced meals throughout the week. This includes: Fruit: 2-3 servings a day Vegetables: 4-5 servings a day Low-fat dairy: 2 servings a day Fish, poultry, or lean meat:  1 serving a day Beans and legumes: 2 or more servings a week Nuts and seeds: 1-2 servings a day Whole grains: 6-8 servings a day Extra-virgin olive oil: 3-4 servings a day Limit red meat and sweets to only a few servings a month What are my food choices? Mediterranean diet Recommended Grains: Whole-grain pasta. Brown rice. Bulgar wheat. Polenta. Couscous. Whole-wheat bread. [place]. Vegetables: Artichokes. Beets. Broccoli. Cabbage. Carrots. Eggplant. Green beans. Chard. Kale.  Spinach. Onions. Leeks. Peas. Squash. Tomatoes. Peppers. Radishes. Fruits: Apples. Apricots. Avocado. Berries. Bananas. Cherries. Dates. Figs. Grapes. Lemons. Melon. Oranges. Peaches. Plums. Pomegranate. Meats and other protein foods: Beans. Almonds. Sunflower seeds. Pine nuts. Peanuts. [place]. Salmon. Scallops. Shrimp. [place]. Tilapia. Clams. Oysters. Eggs. Dairy: Low-fat milk. Cheese. Greek yogurt. Beverages: Water. Red wine. Herbal tea. Fats and oils: Extra virgin olive oil. Avocado oil. Grape seed oil. Sweets and desserts: [place] yogurt with honey. Baked apples. Poached pears. Trail mix. Seasoning and other foods: Basil. Cilantro. Coriander. Cumin. Mint. Parsley. Sage. Rosemary. Tarragon. Garlic. Oregano. Thyme. Pepper. Balsalmic vinegar. Tahini. Hummus. Tomato sauce. Olives. Mushrooms. Limit these Grains: Prepackaged pasta or rice dishes. Prepackaged cereal with added sugar. Vegetables: Deep fried potatoes (french fries). Fruits: Fruit canned in syrup. Meats and other protein foods: Beef. Pork. Lamb. Poultry with skin. Hot dogs. Berniece Salines. Dairy: Ice cream. Sour cream. Whole milk. Beverages: Juice. Sugar-sweetened soft drinks. Beer. Liquor and spirits. Fats and oils: Butter. Canola oil. Vegetable oil. Beef fat (tallow). Lard. Sweets and desserts: Cookies. Cakes. Pies. Candy. Seasoning and other foods: Mayonnaise. Premade sauces and marinades. The items listed may not be a complete list. Talk with your dietitian about what dietary choices are right for you. Summary The Mediterranean diet includes both food and lifestyle choices. Eat a variety of fresh fruits and vegetables, beans, nuts, seeds, and whole grains. Limit the amount of red meat and sweets that you eat. Talk with your health care provider about whether it is safe for you to drink red wine in moderation. This means 1 glass a day for nonpregnant women and 2 glasses a day for men. A glass of wine equals 5 oz (150 mL). This information is  not intended to replace advice given to you by your health care provider. Make sure you discuss any questions you have with your health care provider. Document Released: 03/02/2016 Document Revised: 04/04/2016 Document Reviewed: 03/02/2016 Elsevier Interactive Patient Education  2017 [employer].

## 2022-09-18 NOTE — Progress Notes (Signed)
[address]       [place] [address]             [phone]   PCP is Shirline Frees, NP Referring Provider is Shirline Frees, NP  Chief Complaint: Mild dilatation of the aortic root 4.3 cm   HPI: This is a 77 year old male with a past medical history of hypertension, paroxsymal SVT, psoriasis, meniere disease, chronic diarrhea from gallbladder disease, kidney stones, BPH, hearing loss who was incidentally found to have mild dilatation of the aortic root that measured 4.3 cm in 2023. He denies chest pain, pressure, tightness, shortness of breath, or LE edema. He is fairly active and recently has had issues with memory loss and has seen Neurology.  Past Medical History:  Diagnosis Date   Allergy    BPH (benign prostatic hyperplasia)    Cataract of both eyes    implants both eyes    Eczema    Enlarged aorta (HCC)    Erectile dysfunction    Hearing loss    high frequency   History of inguinal hernia repair, bilateral    Hypertension    Kidney stones    Loss of hearing    high frequency   Meniere disease    Paroxysmal SVT (supraventricular tachycardia)    Psoriasis    S/P appendectomy    S/P tonsillectomy    Seasonal allergies    SVT (supraventricular tachycardia)     Past Surgical History:  Procedure Laterality Date   Ablasion     APPENDECTOMY     CARDIAC ELECTROPHYSIOLOGY MAPPING AND ABLATION  2015   CHOLECYSTECTOMY     COLONOSCOPY     unsure where or when- states greater than 10 yrs ago per pt- was normal per pt and his wife    COLONOSCOPY WITH PROPOFOL N/A 08/26/2018   two tubular adenomas,  diverticulosis.  Repeat colonoscopy in February 2025 if overall health permits.   CYSTOSCOPY WITH RETROGRADE PYELOGRAM, URETEROSCOPY AND STENT PLACEMENT Left 12/30/2014   Procedure: CYSTOSCOPY WITH LEFT URETEROSCOPY STONE EXTRACTION WITH STENT;  Surgeon: Marcine Matar, MD;  Location: [facility];  Service: Urology;  Laterality: Left;   ELECTROPHYSIOLOGIC  STUDY N/A 08/24/2015   Procedure: SVT Ablation;  Surgeon: Hillis Range, MD;  Location: [facility] INVASIVE CV [facility];  Service: Cardiovascular;  Laterality: N/A;   ENDOVENOUS ABLATION SAPHENOUS VEIN W/ LASER Right 12/04/2016   endovenous laser ablation right greater saphenous vein by Josephina Gip MD    ESOPHAGOGASTRODUODENOSCOPY N/A 08/18/2016   Procedure: ESOPHAGOGASTRODUODENOSCOPY (EGD);  Surgeon: Corbin Ade, MD;  Location: [facility];  Service: Endoscopy;  Laterality: N/A;   ESOPHAGOGASTRODUODENOSCOPY N/A 09/06/2016   Procedure: ESOPHAGOGASTRODUODENOSCOPY (EGD);  Surgeon: Corbin Ade, MD;  Location: [facility];  Service: Endoscopy;  Laterality: N/A;  100 - moved to 2/14 @ 2:15 per Darlina Rumpf   ESOPHAGOGASTRODUODENOSCOPY (EGD) WITH PROPOFOL N/A 08/26/2018   Mild Schatzki ring status post dilation, small hiatal hernia.   EYE SURGERY     Catracts removed both eye 30 yrs ago   HERNIA REPAIR     right and left   HOLMIUM LASER APPLICATION Left 12/30/2014   Procedure: HOLMIUM LASER APPLICATION;  Surgeon: Marcine Matar, MD;  Location: [facility];  Service: Urology;  Laterality: Left;   MALONEY DILATION N/A 09/06/2016   Procedure: Elease Hashimoto DILATION;  Surgeon: Corbin Ade, MD;  Location: [facility];  Service: Endoscopy;  Laterality: N/A;   MALONEY DILATION N/A 08/26/2018  Procedure: MALONEY DILATION;  Surgeon: Corbin Ade, MD;  Location: [facility];  Service: Endoscopy;  Laterality: N/A;   POLYPECTOMY  08/26/2018   Procedure: POLYPECTOMY;  Surgeon: Corbin Ade, MD;  Location: [facility];  Service: Endoscopy;;  polyp at descending colon x2   TONSILLECTOMY      Family History  Problem Relation Age of Onset   Cancer Mother        lung   Thyroid disease Mother    Diabetes Father    GI Bleed Father    Aneurysm Father    AAA (abdominal aortic aneurysm) Father    AAA (abdominal aortic aneurysm) Brother    Colon cancer Neg Hx    Colon polyps Neg Hx    Rectal cancer Neg Hx     Stomach cancer Neg Hx     Social History Social History   Tobacco Use   Smoking status: Former    Types: Cigarettes    Quit date: 07/24/1964    Years since quitting: 58.1   Smokeless tobacco: Never   Tobacco comments:    Quit 20-30 years ago; smoked pipe and cigars   Vaping Use   Vaping Use: Never used  Substance Use Topics   Alcohol use: Yes    Alcohol/week: 2.0 standard drinks of alcohol    Types: 2 Cans of beer per week    Comment: 2 per day   Drug use: No    Current Outpatient Medications  Medication Sig Dispense Refill   amLODipine (NORVASC) 10 MG tablet Take 1 tablet (10 mg total) by mouth daily. 90 tablet 3   cetirizine (ZYRTEC) 10 MG tablet Take 10 mg by mouth daily as needed for allergies.      CRANBERRY POWDER PO Take by mouth. 15,000 UNITS as needed     Cyanocobalamin (VITAMIN B-12 PO) Take 1 tablet by mouth daily.      desonide (DESOWEN) 0.05 % cream Apply 1 application topically daily as needed (rash).      diphenhydrAMINE HCl (BENADRYL ALLERGY PO) Take 1 tablet by mouth daily.     dutasteride (AVODART) 0.5 MG capsule Take 1 capsule (0.5 mg total) by mouth daily. 90 capsule 3   EPINEPHrine 0.3 mg/0.3 mL IJ SOAJ injection Inject 0.3 mLs (0.3 mg total) into the muscle as needed for anaphylaxis. 1 each 1   fluticasone (FLONASE) 50 MCG/ACT nasal spray INSTILL TWO SPRAYS IN EACH NOSTRIL DAILY AS NEEDED FOR ALLERGIES 48 g 1   hydrocortisone cream 1 % Apply 1 application topically daily as needed for itching.     lansoprazole (PREVACID) 30 MG capsule TAKE 1 CAPSULE BY MOUTH DAILY BEFORE BREAKFAST 90 capsule 3   loperamide (IMODIUM) 2 MG capsule Take by mouth as needed for diarrhea or loose stools. Patient takes every 2-3 days.     memantine (NAMENDA) 10 MG tablet Take 1 tablet (10 mg)  twice a day 60 tablet 11   Multiple Vitamin (ONE DAILY) tablet Take by mouth.     Simethicone (GAS-X PO) Take 1 tablet by mouth daily as needed.      tamsulosin (FLOMAX) 0.4 MG CAPS capsule  Take 2 capsules (0.8 mg total) by mouth daily. 180 capsule 3   vitamin C (ASCORBIC ACID) 500 MG tablet Take 500 mg by mouth daily.     Allergies  Allergen Reactions   Peanut-Containing Drug Products Hives   Penicillins Hives    Has patient had a PCN reaction causing immediate rash, facial/tongue/throat swelling,  SOB or lightheadedness with hypotension: No Has patient had a PCN reaction causing severe rash involving mucus membranes or skin necrosis: No Has patient had a PCN reaction that required hospitalization No Has patient had a PCN reaction occurring within the last 10 years: No If all of the above answers are "NO", then may proceed with Cephalosporin use.   Quinolones     Patient was warned about not using Cipro and similar antibiotics. Recent studies have raised concern that fluoroquinolone antibiotics could be associated with an increased risk of aortic aneurysm Fluoroquinolones have non-antimicrobial properties that might jeopardise the integrity of the extracellular matrix of the vascular wall In a  propensity score matched cohort study in [place], there was a 66% increased rate of aortic aneurysm or dissection associated with oral fluoroquinolone use, compared wit    Review of Systems  Chest Pain [ n ]  Pedal Edema [ n ] Syncope [ n ]  General Review of Systems: [y] = yes [ n]=no  Consitutional:   nausea [n ];  fever [n ];  Eye : blurred vision [ ] ; Amaurosis fugax[ n ];  Resp: cough [ n];  hemoptysis[ n];  GI: vomiting[ n]; melena[n ]; hematochezia [n];  [id number][ n]; Heme/Lymph: anemia[ n];  Neuro: TIA[n ];stroke[ n];  seizures[ n ];  Endocrine: diabetes[ n];   Vital Signs: Vitals:   09/21/22 1334  BP: (!) 142/77  Pulse: 74  Resp: 20  SpO2: 93%    Physical Exam CV-RRR, no murmur Neck-No carotid bruit Pulmonary-Clear to auscultation bilaterally Abdomen-Soft, non tender bowel sounds present Extremities-No LE edema Neurologic-Grossly intact without focal  deficit  Diagnostic Tests:  Narrative & Impression  CLINICAL DATA:  Follow up thoracic aortic aneurysm.   Creatinine was obtained on site at [facility] at [address]   Results: Creatinine 1.2 mg/dL.   EXAM: CT ANGIOGRAPHY CHEST WITH CONTRAST   TECHNIQUE: Multidetector CT imaging of the chest was performed using the standard protocol during bolus administration of intravenous contrast. Multiplanar CT image reconstructions and MIPs were obtained to evaluate the vascular anatomy.   RADIATION DOSE REDUCTION: This exam was performed according to the departmental dose-optimization program which includes automated exposure control, adjustment of the mA and/or kV according to patient size and/or use of iterative reconstruction technique.   CONTRAST:  60mL ISOVUE-370 IOPAMIDOL (ISOVUE-370) INJECTION 76%   COMPARISON:  Chest CTA 04/14/2021 and 10/16/2019.   FINDINGS: Cardiovascular: Stable aortic atherosclerosis with diffuse tortuosity of the descending aorta. Dilatation of the aortic root is best seen on today's coronal images and measures 4.5 cm on image 78/5, not significantly changed from previous studies. The ascending aorta, aortic arch and descending aorta are normal in caliber without aneurysm or dissection. Three-vessel coronary artery and mild great vessel atherosclerosis are noted. The heart size is normal. There is no pericardial effusion.   Mediastinum/Nodes: There are no enlarged mediastinal, hilar or axillary lymph nodes. The thyroid gland, trachea and esophagus demonstrate no significant findings.   Lungs/Pleura: No pleural effusion or pneumothorax. There is stable mild scarring at both lung bases. No confluent airspace opacity or suspicious pulmonary nodule.   Upper abdomen: The visualized upper abdomen appears stable without suspicious findings. There are scattered cysts within the liver and kidneys for which no specific follow-up imaging is  recommended.   Musculoskeletal/Chest wall: There is no chest wall mass or suspicious osseous finding. Mild spondylosis associated with a mild thoracolumbar scoliosis.   Review of the MIP images confirms the above  findings.   IMPRESSION: 1. Similar dilatation of the aortic root, measuring up to 4.5 cm in diameter. 2. No other significant vascular findings. There is stable tortuosity of the descending thoracic aorta without aneurysm or dissection. 3. No acute chest findings. 4. Coronary and aortic Atherosclerosis (ICD10-I70.0).     Electronically Signed   By: Carey Bullocks M.D.   On: 09/21/2022 13:28       Impression and Plan: CTA with a 4.3  cm aortic root dilatation.   We discussed the natural history and and risk factors for growth of ascending aortic aneurysms.  We covered the importance of smoking cessation, tight blood pressure control, refraining from lifting heavy objects, and avoiding fluoroquinolones.  The patient is aware of signs and symptoms of aortic dissection and when to present to the emergency department.  We will continue surveillance and a repeat CTA was ordered for 1 year.    Ardelle Balls, PA-C [facility] [phone]

## 2022-09-21 ENCOUNTER — Ambulatory Visit
Admission: RE | Admit: 2022-09-21 | Discharge: 2022-09-21 | Disposition: A | Discharge status: Home / Self Care | Payer: Medicare Other | Source: Ambulatory Visit | Attending: Thoracic Surgery (Cardiothoracic Vascular Surgery) | Admitting: Thoracic Surgery (Cardiothoracic Vascular Surgery)

## 2022-09-21 ENCOUNTER — Ambulatory Visit (INDEPENDENT_AMBULATORY_CARE_PROVIDER_SITE_OTHER): Payer: Medicare Other | Admitting: Physician Assistant

## 2022-09-21 ENCOUNTER — Encounter: Payer: Self-pay | Admitting: Physician Assistant

## 2022-09-21 VITALS — BP 142/77 | HR 74 | Resp 20 | Wt 205.0 lb

## 2022-09-21 DIAGNOSIS — I712 Thoracic aortic aneurysm, without rupture, unspecified: Secondary | ICD-10-CM | POA: Diagnosis not present

## 2022-09-21 DIAGNOSIS — I77810 Thoracic aortic ectasia: Secondary | ICD-10-CM

## 2022-09-21 MED ORDER — IOPAMIDOL (ISOVUE-370) INJECTION 76%
60.0000 mL | Freq: Once | INTRAVENOUS | Status: AC | PRN
  Administered 2022-09-21: 60 mL via INTRAVENOUS

## 2022-09-21 NOTE — Patient Instructions (Addendum)
  Risk Modification in those with ascending thoracic aortic aneurysm:  Continue good control of blood pressure (prefer SBP 130/80 or less)-will defer to PCP As is on Amlodipine. We also discussed low salt diet.  2. Avoid fluoroquinolone antibiotics (I.e Ciprofloxacin, Avelox, Levofloxacin, Ofloxacin)  3.  Use of statin (to decrease cardiovascular risk). Last lipid profile available in EPIC showed Total cholesterol 154, Triglycerides 144, LDL 78, and HDL 47. Again, will defer to PCP  4.  Exercise and activity limitations is individualized, but in general, contact sports are to be  avoided and one should avoid heavy lifting (defined as half of ideal body weight) and exercises involving sustained Valsalva maneuver.  5. Counseling for those suspected of having genetically mediated disease. First-degree relatives of those with TAA disease should be screened as well as those who have a connective tissue disease (I.e with Marfan syndrome, Ehlers-Danlos syndrome,  and Loeys-Dietz syndrome) or a  bicuspid aortic valve,have an increased risk for  complications related to TAA. Echo done in 2018 showed aortic valve to be tricuspid and mild aortic insufficiency  6.He has a remote history of tobocco but quit many years ago

## 2022-09-22 ENCOUNTER — Ambulatory Visit: Payer: Medicare Other

## 2022-09-22 VITALS — Ht 69.0 in | Wt 205.0 lb

## 2022-09-22 DIAGNOSIS — Z0000 Encounter for general adult medical examination without abnormal findings: Secondary | ICD-10-CM

## 2022-09-22 NOTE — Progress Notes (Signed)
Subjective:   Daniel Guerrero is a 77 y.o. male who presents for Medicare Annual/Subsequent preventive examination.  Review of Systems    Virtual Visit via Telephone Note  I connected with  Daniel Guerrero on 09/22/22 at 10:30 AM EST by telephone and verified that I am speaking with the correct person using two identifiers.  Location: Patient: Home Provider: Office Persons participating in the virtual visit: patient/Nurse Health Advisor   I discussed the limitations, risks, security and privacy concerns of performing an evaluation and management service by telephone and the availability of in person appointments. The patient expressed understanding and agreed to proceed.  Interactive audio and video telecommunications were attempted between this nurse and patient, however failed, due to patient having technical difficulties OR patient did not have access to video capability.  We continued and completed visit with audio only.  Some vital signs may be absent or patient reported.   Criselda Peaches, LPN  Cardiac Risk Factors include: advanced age (>34mn, >>107women);hypertension;male gender     Objective:    Today's Vitals   09/22/22 1039  Weight: 205 lb (93 kg)  Height: '5\' 9"'$  (1.753 m)   Body mass index is 30.27 kg/m.     09/22/2022   10:47 AM 09/07/2022   10:49 AM 03/06/2022   12:58 PM 09/20/2021   10:44 AM 09/05/2021   10:44 AM 03/02/2021    1:13 PM 08/25/2020    2:26 PM  Advanced Directives  Does Patient Have a Medical Advance Directive? Yes Yes Yes Yes Yes Yes Yes  Type of [place]   [place]   [place] Does patient want to make changes to medical advance directive? No - Patient declined   No - Patient declined     Copy of [place] Chart? Yes - validated most recent copy scanned in chart (See row information)   No - copy requested   Yes - validated  most recent copy scanned in chart (See row information)    Current Medications (verified) Outpatient Encounter Medications as of 09/22/2022  Medication Sig   amLODipine (NORVASC) 10 MG tablet Take 1 tablet (10 mg total) by mouth daily.   cetirizine (ZYRTEC) 10 MG tablet Take 10 mg by mouth daily as needed for allergies.    CRANBERRY POWDER PO Take by mouth. 15,000 UNITS as needed   Cyanocobalamin (VITAMIN B-12 PO) Take 1 tablet by mouth daily.    desonide (DESOWEN) 0.05 % cream Apply 1 application topically daily as needed (rash).    diphenhydrAMINE HCl (BENADRYL ALLERGY PO) Take 1 tablet by mouth daily.   dutasteride (AVODART) 0.5 MG capsule Take 1 capsule (0.5 mg total) by mouth daily.   EPINEPHrine 0.3 mg/0.3 mL IJ SOAJ injection Inject 0.3 mLs (0.3 mg total) into the muscle as needed for anaphylaxis.   fluticasone (FLONASE) 50 MCG/ACT nasal spray INSTILL TWO SPRAYS IN EACH NOSTRIL DAILY AS NEEDED FOR ALLERGIES   hydrocortisone cream 1 % Apply 1 application topically daily as needed for itching.   lansoprazole (PREVACID) 30 MG capsule TAKE 1 CAPSULE BY MOUTH DAILY BEFORE BREAKFAST   loperamide (IMODIUM) 2 MG capsule Take by mouth as needed for diarrhea or loose stools. Patient takes every 2-3 days.   memantine (NAMENDA) 10 MG tablet Take 1 tablet (10 mg)  twice a day   Multiple Vitamin (ONE DAILY) tablet Take by mouth.   Simethicone (GAS-X  PO) Take 1 tablet by mouth daily as needed.    tamsulosin (FLOMAX) 0.4 MG CAPS capsule Take 2 capsules (0.8 mg total) by mouth daily.   vitamin C (ASCORBIC ACID) 500 MG tablet Take 500 mg by mouth daily.   No facility-administered encounter medications on file as of 09/22/2022.    Allergies (verified) Peanut-containing drug products, Penicillins, and Quinolones   History: Past Medical History:  Diagnosis Date   Allergy    BPH (benign prostatic hyperplasia)    Cataract of both eyes    implants both eyes    Eczema    Enlarged aorta (HCC)     Erectile dysfunction    Hearing loss    high frequency   History of inguinal hernia repair, bilateral    Hypertension    Kidney stones    Loss of hearing    high frequency   Meniere disease    Paroxysmal SVT (supraventricular tachycardia)    Psoriasis    S/P appendectomy    S/P tonsillectomy    Seasonal allergies    SVT (supraventricular tachycardia)    Past Surgical History:  Procedure Laterality Date   Ablasion     APPENDECTOMY     CARDIAC ELECTROPHYSIOLOGY MAPPING AND ABLATION  2015   CHOLECYSTECTOMY     COLONOSCOPY     unsure where or when- states greater than 10 yrs ago per pt- was normal per pt and his wife    COLONOSCOPY WITH PROPOFOL N/A 08/26/2018   two tubular adenomas,  diverticulosis.  Repeat colonoscopy in February 2025 if overall health permits.   CYSTOSCOPY WITH RETROGRADE PYELOGRAM, URETEROSCOPY AND STENT PLACEMENT Left 12/30/2014   Procedure: CYSTOSCOPY WITH LEFT URETEROSCOPY STONE EXTRACTION WITH STENT;  Surgeon: Franchot Gallo, MD;  Location: [facility];  Service: Urology;  Laterality: Left;   ELECTROPHYSIOLOGIC STUDY N/A 08/24/2015   Procedure: SVT Ablation;  Surgeon: Thompson Grayer, MD;  Location: [facility];  Service: Cardiovascular;  Laterality: N/A;   ENDOVENOUS ABLATION SAPHENOUS VEIN W/ LASER Right 12/04/2016   endovenous laser ablation right greater saphenous vein by Tinnie Gens MD    ESOPHAGOGASTRODUODENOSCOPY N/A 08/18/2016   Procedure: ESOPHAGOGASTRODUODENOSCOPY (EGD);  Surgeon: [name], MD;  [facility]: [facility];  Service: Endoscopy;  Laterality: N/A;   ESOPHAGOGASTRODUODENOSCOPY N/A 09/06/2016   Procedure: ESOPHAGOGASTRODUODENOSCOPY (EGD);  Surgeon: [name], MD;  [facility];  Service: Endoscopy;  Laterality: N/A;  100 - moved to 2/14 @ 2:15 per Tretha Sciara   ESOPHAGOGASTRODUODENOSCOPY (EGD) WITH PROPOFOL N/A 08/26/2018   Mild Schatzki ring status post dilation, small hiatal hernia.   EYE SURGERY     Catracts  removed both eye 30 yrs ago   [place]     right and left   HOLMIUM LASER APPLICATION Left [id number]   Procedure: HOLMIUM LASER APPLICATION;  Surgeon: Franchot Gallo, MD;  Location: [facility];  Service: Urology;  Laterality: Left;   MALONEY DILATION N/A 09/06/2016   Procedure: Venia Minks DILATION;  Surgeon: [name], MD;  [facility];  Service: Endoscopy;  Laterality: N/A;   MALONEY DILATION N/A 08/26/2018   Procedure: Venia Minks DILATION;  Surgeon: [name], MD;  [facility];  Service: Endoscopy;  Laterality: N/A;   POLYPECTOMY  08/26/2018   Procedure: POLYPECTOMY;  Surgeon: [name], MD;  [facility];  Service: Endoscopy;;  polyp at descending colon x2   TONSILLECTOMY     Family History  Problem Relation Age of Onset  Cancer Mother        lung   Thyroid disease Mother    Diabetes Father    GI Bleed Father    Aneurysm Father    AAA (abdominal aortic aneurysm) Father    AAA (abdominal aortic aneurysm) Brother    Colon cancer Neg Hx    Colon polyps Neg Hx    Rectal cancer Neg Hx    Stomach cancer Neg Hx    Social History   Socioeconomic History   Marital status: Married    Spouse name: Not on file   Number of children: 3   Years of education: 16   Highest education level: Bachelor's degree (e.g., BA, AB, BS)  Occupational History   Occupation: professor    Comment: retired  Tobacco Use   Smoking status: Former    Types: Cigarettes    Quit date: 07/24/1964    Years since quitting: 58.2   Smokeless tobacco: Never   Tobacco comments:    Quit 20-30 years ago; smoked pipe and cigars   Vaping Use   Vaping Use: Never used  Substance and Sexual Activity   Alcohol use: Yes    Alcohol/week: 2.0 standard drinks of alcohol    Types: 2 Cans of beer per week    Comment: 2 per day   Drug use: No   Sexual activity: Not on file  Other Topics Concern   Not on file  Social History Narrative   Daily Caffeine use: 5-6 drinks  daily      08/07/2018: No drug use, some daily alcohol use at night but patient does not feel like it disrupts his health or is a safety concern.       Lives with wife in two story home; three sons, five grandchildren in [place] area.   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    Right handed            Social Determinants of Health   Financial Resource Strain: Low Risk  (09/22/2022)   Overall Financial Resource Strain (CARDIA)    Difficulty of Paying Living Expenses: Not hard at all  Food Insecurity: No Food Insecurity (09/22/2022)   Hunger Vital Sign    Worried About Running Out of Food in the Last Year: Never true    Ran Out of Food in the Last Year: Never true  Transportation Needs: No Transportation Needs (09/22/2022)   PRAPARE - Hydrologist (Medical): No    Lack of Transportation (Non-Medical): No  Physical Activity: Insufficiently Active (09/22/2022)   Exercise Vital Sign    Days of Exercise per Week: 4 days    Minutes of Exercise per Session: 20 min  Stress: No Stress Concern Present (09/22/2022)   [place]    Feeling of Stress : Not at all  Social Connections: [place] (09/22/2022)   Social Connection and Isolation Panel [NHANES]    Frequency of Communication with Friends and Family: More than three times a week    Frequency of Social Gatherings with Friends and Family: More than three times a week    Attends Religious Services: More than 4 times per year    Active Member of [employer] or Organizations: Yes    Attends Music therapist: More than 4 times per year    Marital Status: Married    Tobacco Counseling Counseling given: Not Answered Tobacco  comments: Quit 20-30 years ago; smoked pipe and cigars    Clinical Intake:  Pre-visit preparation completed: Yes  Pain : No/denies pain      BMI - recorded: 30.27 Nutritional Status: BMI > 30  Obese Nutritional Risks: None Diabetes: No  How often do you need to have someone help you when you read instructions, pamphlets, or other written materials from your doctor or pharmacy?: 1 - Never  Diabetic?  No  Interpreter Needed?: No  Information entered by :: Rolene Arbour LPN   Activities of Daily Living    09/22/2022   10:45 AM 09/21/2022    8:15 PM  In your present state of health, do you have any difficulty performing the following activities:  Hearing? 1 1  Comment Wears hearing aids   Vision? 0 0  Difficulty concentrating or making decisions? 0 1  Walking or climbing stairs? 0 0  Dressing or bathing? 0 0  Doing errands, shopping? 0 0  Preparing Food and eating ? N N  Using the Toilet? N N  In the past six months, have you accidently leaked urine? N N  Do you have problems with loss of bowel control? N N  Managing your Medications? N N  Managing your Finances? N N  Housekeeping or managing your Housekeeping? N N    Patient Care Team: Dorothyann Peng, NP as PCP - General (Family Medicine) Herminio Commons, MD (Inactive) as PCP - Cardiology (Cardiology) Grace Isaac, MD (Inactive) as Consulting Physician (Cardiothoracic Surgery) Thompson Grayer, MD (Inactive) as Consulting Physician (Cardiology) Herminio Commons, MD (Inactive) as Attending Physician (Cardiology) Susa Day, MD as Consulting Physician (Orthopedic Surgery) Lucas Mallow, MD as Consulting Physician (Urology) Jola Schmidt, MD as Consulting Physician (Ophthalmology) Gala Romney Cristopher Estimable, MD as Consulting Physician (Gastroenterology)  Indicate any recent Medical Services you may have received from other than [facility] providers in the past year (date may be approximate).     Assessment:   This is a routine wellness examination for Daniel Guerrero.  Hearing/Vision screen Hearing Screening - Comments:: Wears hearing aids Vision Screening -  Comments:: Wears reading - up to date with routine eye exams with Dr Delman Cheadle   Dietary issues and exercise activities discussed: Current Exercise Habits: Home exercise routine, Type of exercise: walking, Time (Minutes): 20, Frequency (Times/Week): 4, Weekly Exercise (Minutes/Week): 80, Intensity: Moderate, Exercise limited by: None identified   Goals Addressed               This Visit's Progress     Patient Stated (pt-stated)        Stay healthy and continue to walk and tend to my garden       Depression Screen    09/22/2022   10:43 AM 09/20/2021   10:40 AM 08/25/2020    2:42 PM 06/08/2020   10:00 AM 08/15/2019    8:37 AM 08/07/2018    2:58 PM 07/27/2017    1:36 PM  PHQ 2/9 Scores  PHQ - 2 Score 0 0 0 0 0 1 0  PHQ- 9 Score      2     Fall Risk    09/22/2022   10:45 AM 09/21/2022    8:15 PM 09/07/2022   10:49 AM 03/06/2022   12:57 PM 09/20/2021   10:43 AM  Fall Risk   Falls in the past year? 0 0 0 0 0  Number falls in past yr: 0 0 0 0 0  Injury with Fall? 0 0 0  0 0  Risk for fall due to : No Fall Risks    No Fall Risks  Follow up Falls prevention discussed  Falls evaluation completed      FALL RISK PREVENTION PERTAINING TO THE HOME:  Any stairs in or around the home? Yes  If so, are there any without handrails? No  Home free of loose throw rugs in walkways, pet beds, electrical cords, etc? Yes  Adequate lighting in your home to reduce risk of falls? Yes   ASSISTIVE DEVICES UTILIZED TO PREVENT FALLS:  Life alert? No  Use of a cane, walker or w/c? No  Grab bars in the bathroom? No  Shower chair or bench in shower? No  Elevated toilet seat or a handicapped toilet? No   TIMED UP AND GO:  Was the test performed? No . Audio Visit   Cognitive Function:    09/07/2022   12:00 PM 03/07/2022   12:00 PM 02/01/2021    5:45 PM  MMSE - Mini Mental State Exam  Orientation to time '5 2 5  '$ Orientation to Place '5 5 5  '$ Registration '3 3 3  '$ Attention/ Calculation '4 4 4  '$ Recall '2 2  1  '$ Language- name 2 objects '2 2 2  '$ Language- repeat '1 1 1  '$ Language- follow 3 step command '3 3 3  '$ Language- read & follow direction '1 1 1  '$ Write a sentence 1 0 1  Copy design '1 1 1  '$ Total score '28 24 27      '$ 03/02/2021    3:00 PM  Montreal Cognitive Assessment   Visuospatial/ Executive (0/5) 4  Naming (0/3) 2  Attention: Read list of digits (0/2) 2  Attention: Read list of letters (0/1) 1  Attention: Serial 7 subtraction starting at 100 (0/3) 3  Language: Repeat phrase (0/2) 2  Language : Fluency (0/1) 1  Abstraction (0/2) 1  Delayed Recall (0/5) 0  Orientation (0/6) 4  Total 20  Adjusted Score (based on education) 20      09/22/2022   10:47 AM 09/20/2021   10:45 AM 08/25/2020    2:49 PM 08/15/2019    8:38 AM  6CIT Screen  What Year? 0 points 0 points 0 points 0 points  What month? 0 points 0 points 0 points 0 points  What time? 0 points 0 points  0 points  Count back from 20 0 points 0 points 0 points 0 points  Months in reverse 2 points 0 points 4 points 0 points  Repeat phrase 0 points 0 points 10 points 0 points  Total Score 2 points 0 points  0 points    Immunizations Immunization History  Administered Date(s) Administered   Hepatitis A 09/28/2017   Influenza, High Dose Seasonal PF 04/14/2016   Influenza,inj,Quad PF,6+ Mos 04/16/2014, 05/01/2018   Influenza,inj,quad, With Preservative 05/22/2017   Influenza-Unspecified 05/25/2015, 05/24/2017, 04/08/2019, 03/23/2020, 04/12/2022   Moderna SARS-COV2 Booster Vaccination 11/10/2021, 04/12/2022   PFIZER(Purple Top)SARS-COV-2 Vaccination 09/07/2019, 09/30/2019, 03/20/2020   Pneumococcal Conjugate-13 04/14/2016   Pneumococcal Polysaccharide-23 04/23/2008, 12/18/2013, 05/01/2018   Td 12/13/2007   Tdap 09/28/2017   Zoster Recombinat (Shingrix) 08/10/2017, 12/06/2017    TDAP status: Up to date  Flu Vaccine status: Up to date  Pneumococcal vaccine status: Up to date  Covid-19 vaccine status: Completed  vaccines  Qualifies for Shingles Vaccine? Yes   Zostavax completed Yes   Shingrix Completed?: Yes  Screening Tests Health Maintenance  Topic Date Due   COVID-19 Vaccine (6 - 2023-24 season)  10/08/2022 (Originally 06/07/2022)   COLONOSCOPY (Pts 45-98yr Insurance coverage will need to be confirmed)  09/22/2023 (Originally 08/26/2021)   Diabetic kidney evaluation - Urine ACR  07/12/2027 (Originally 12/12/1963)   Diabetic kidney evaluation - eGFR measurement  07/12/2023   Medicare Annual Wellness (AWV)  09/22/2023   DTaP/Tdap/Td (3 - Td or Tdap) 09/29/2027   Pneumonia Vaccine 77 Years old  Completed   INFLUENZA VACCINE  Completed   Hepatitis C Screening  Completed   Zoster Vaccines- Shingrix  Completed   HPV VACCINES  Aged Out   FOOT EXAM  Discontinued   HEMOGLOBIN A1C  Discontinued   OPHTHALMOLOGY EXAM  Discontinued    Health Maintenance  There are no preventive care reminders to display for this patient.   Colorectal cancer screening: Type of screening: Colonoscopy. Completed 08/26/18. Repeat every 3 years  Lung Cancer Screening: (Low Dose CT Chest recommended if Age 77-80years, 30 pack-year currently smoking OR have quit w/in 15years.) does not qualify.    Additional Screening:  Hepatitis C Screening: does qualify; Completed 02/20/19  Vision Screening: Recommended annual ophthalmology exams for early detection of glaucoma and other disorders of the eye. Is the patient up to date with their annual eye exam?  Yes  Who is the provider or what is the name of the office in which the patient attends annual eye exams? Dr GDelman CheadleIf pt is not established with a provider, would they like to be referred to a provider to establish care? No .   Dental Screening: Recommended annual dental exams for proper oral hygiene  Community Resource Referral / Chronic Care Management:  CRR required this visit?  No   CCM required this visit?  No      Plan:     I have personally reviewed and  noted the following in the patient's chart:   Medical and social history Use of alcohol, tobacco or illicit drugs  Current medications and supplements including opioid prescriptions. Patient is not currently taking opioid prescriptions. Functional ability and status Nutritional status Physical activity Advanced directives List of other physicians Hospitalizations, surgeries, and ER visits in previous 12 months Vitals Screenings to include cognitive, depression, and falls Referrals and appointments  In addition, I have reviewed and discussed with patient certain preventive protocols, quality metrics, and best practice recommendations. A written personalized care plan for preventive services as well as general preventive health recommendations were provided to patient.     BCriselda Peaches LPN   [id number]  Nurse Notes: None

## 2022-09-22 NOTE — Patient Instructions (Addendum)
Daniel Guerrero , Thank you for taking time to come for your Medicare Wellness Visit. I appreciate your ongoing commitment to your health goals. Please review the following plan we discussed and let me know if I can assist you in the future.   These are the goals we discussed:  Goals       Patient Stated (pt-stated)      To continue walking this year Walks outdoors Baseline; try to increase up to 30 minutes 5 days a week.        Patient Stated      Keep doing what I love-teach!       Patient Stated (pt-stated)      Stay healthy and continue to walk and tend to my garden        This is a list of the screening recommended for you and due dates:  Health Maintenance  Topic Date Due   COVID-19 Vaccine (6 - 2023-24 season) 10/08/2022*   Colon Cancer Screening  09/22/2023*   Yearly kidney health urinalysis for diabetes  07/12/2027*   Yearly kidney function blood test for diabetes  07/12/2023   Medicare Annual Wellness Visit  09/22/2023   DTaP/Tdap/Td vaccine (3 - Td or Tdap) 09/29/2027   Pneumonia Vaccine  Completed   Flu Shot  Completed   Hepatitis C Screening: USPSTF Recommendation to screen - Ages 18-79 yo.  Completed   Zoster (Shingles) Vaccine  Completed   HPV Vaccine  Aged Out   Complete foot exam   Discontinued   Hemoglobin A1C  Discontinued   Eye exam for diabetics  Discontinued  *Topic was postponed. The date shown is not the original due date.    Advanced directives: In chart  Conditions/risks identified: None  Next appointment: Follow up in one year for your annual wellness visit.   Preventive Care 47 Years and Older, Male  Preventive care refers to lifestyle choices and visits with your health care provider that can promote health and wellness. What does preventive care include? A yearly physical exam. This is also called an annual well check. Dental exams once or twice a year. Routine eye exams. Ask your health care provider how often you should have your eyes  checked. Personal lifestyle choices, including: Daily care of your teeth and gums. Regular physical activity. Eating a healthy diet. Avoiding tobacco and drug use. Limiting alcohol use. Practicing safe sex. Taking low doses of aspirin every day. Taking vitamin and mineral supplements as recommended by your health care provider. What happens during an annual well check? The services and screenings done by your health care provider during your annual well check will depend on your age, overall health, lifestyle risk factors, and family history of disease. Counseling  Your health care provider may ask you questions about your: Alcohol use. Tobacco use. Drug use. Emotional well-being. Home and relationship well-being. Sexual activity. Eating habits. History of falls. Memory and ability to understand (cognition). Work and work Statistician. Screening  You may have the following tests or measurements: Height, weight, and BMI. Blood pressure. Lipid and cholesterol levels. These may be checked every 5 years, or more frequently if you are over 75 years old. Skin check. Lung cancer screening. You may have this screening every year starting at age 81 if you have a 30-pack-year history of smoking and currently smoke or have quit within the past 15 years. Fecal occult blood test (FOBT) of the stool. You may have this test every year starting at age 45. Flexible  sigmoidoscopy or colonoscopy. You may have a sigmoidoscopy every 5 years or a colonoscopy every 10 years starting at age 65. Prostate cancer screening. Recommendations will vary depending on your family history and other risks. Hepatitis C blood test. Hepatitis B blood test. Sexually transmitted disease (STD) testing. Diabetes screening. This is done by checking your blood sugar (glucose) after you have not eaten for a while (fasting). You may have this done every 1-3 years. Abdominal aortic aneurysm (AAA) screening. You may need this  if you are a current or former smoker. Osteoporosis. You may be screened starting at age 60 if you are at high risk. Talk with your health care provider about your test results, treatment options, and if necessary, the need for more tests. Vaccines  Your health care provider may recommend certain vaccines, such as: Influenza vaccine. This is recommended every year. Tetanus, diphtheria, and acellular pertussis (Tdap, Td) vaccine. You may need a Td booster every 10 years. Zoster vaccine. You may need this after age 91. Pneumococcal 13-valent conjugate (PCV13) vaccine. One dose is recommended after age 45. Pneumococcal polysaccharide (PPSV23) vaccine. One dose is recommended after age 35. Talk to your health care provider about which screenings and vaccines you need and how often you need them. This information is not intended to replace advice given to you by your health care provider. Make sure you discuss any questions you have with your health care provider. Document Released: 08/06/2015 Document Revised: 03/29/2016 Document Reviewed: 05/11/2015 Elsevier Interactive Patient Education  2017 [employer] Prevention in the Home Falls can cause injuries. They can happen to people of all ages. There are many things you can do to make your home safe and to help prevent falls. What can I do on the outside of my home? Regularly fix the edges of walkways and driveways and fix any cracks. Remove anything that might make you trip as you walk through a door, such as a raised step or threshold. Trim any bushes or trees on the path to your home. Use bright outdoor lighting. Clear any walking paths of anything that might make someone trip, such as rocks or tools. Regularly check to see if handrails are loose or broken. Make sure that both sides of any steps have handrails. Any raised decks and porches should have guardrails on the edges. Have any leaves, snow, or ice cleared regularly. Use sand  or salt on walking paths during winter. Clean up any spills in your garage right away. This includes oil or grease spills. What can I do in the bathroom? Use night lights. Install grab bars by the toilet and in the tub and shower. Do not use towel bars as grab bars. Use non-skid mats or decals in the tub or shower. If you need to sit down in the shower, use a plastic, non-slip stool. Keep the floor dry. Clean up any water that spills on the floor as soon as it happens. Remove soap buildup in the tub or shower regularly. Attach bath mats securely with double-sided non-slip rug tape. Do not have throw rugs and other things on the floor that can make you trip. What can I do in the bedroom? Use night lights. Make sure that you have a light by your bed that is easy to reach. Do not use any sheets or blankets that are too big for your bed. They should not hang down onto the floor. Have a firm chair that has side arms. You can use this for  support while you get dressed. Do not have throw rugs and other things on the floor that can make you trip. What can I do in the kitchen? Clean up any spills right away. Avoid walking on wet floors. Keep items that you use a lot in easy-to-reach places. If you need to reach something above you, use a strong step stool that has a grab bar. Keep electrical cords out of the way. Do not use floor polish or wax that makes floors slippery. If you must use wax, use non-skid floor wax. Do not have throw rugs and other things on the floor that can make you trip. What can I do with my stairs? Do not leave any items on the stairs. Make sure that there are handrails on both sides of the stairs and use them. Fix handrails that are broken or loose. Make sure that handrails are as long as the stairways. Check any carpeting to make sure that it is firmly attached to the stairs. Fix any carpet that is loose or worn. Avoid having throw rugs at the top or bottom of the stairs.  If you do have throw rugs, attach them to the floor with carpet tape. Make sure that you have a light switch at the top of the stairs and the bottom of the stairs. If you do not have them, ask someone to add them for you. What else can I do to help prevent falls? Wear shoes that: Do not have high heels. Have rubber bottoms. Are comfortable and fit you well. Are closed at the toe. Do not wear sandals. If you use a stepladder: Make sure that it is fully opened. Do not climb a closed stepladder. Make sure that both sides of the stepladder are locked into place. Ask someone to hold it for you, if possible. Clearly mark and make sure that you can see: Any grab bars or handrails. First and last steps. Where the edge of each step is. Use tools that help you move around (mobility aids) if they are needed. These include: Canes. Walkers. Scooters. Crutches. Turn on the lights when you go into a dark area. Replace any light bulbs as soon as they burn out. Set up your furniture so you have a clear path. Avoid moving your furniture around. If any of your floors are uneven, fix them. If there are any pets around you, be aware of where they are. Review your medicines with your doctor. Some medicines can make you feel dizzy. This can increase your chance of falling. Ask your doctor what other things that you can do to help prevent falls. This information is not intended to replace advice given to you by your health care provider. Make sure you discuss any questions you have with your health care provider. Document Released: 05/06/2009 Document Revised: 12/16/2015 Document Reviewed: 08/14/2014 Elsevier Interactive Patient Education  2017 [employer].

## 2022-09-30 DIAGNOSIS — Z23 Encounter for immunization: Secondary | ICD-10-CM | POA: Diagnosis not present

## 2022-11-08 ENCOUNTER — Encounter: Payer: Self-pay | Admitting: Gastroenterology

## 2022-11-16 ENCOUNTER — Encounter: Payer: Self-pay | Admitting: Psychology

## 2022-11-16 DIAGNOSIS — I7789 Other specified disorders of arteries and arterioles: Secondary | ICD-10-CM | POA: Insufficient documentation

## 2022-11-16 DIAGNOSIS — H8109 Meniere's disease, unspecified ear: Secondary | ICD-10-CM | POA: Insufficient documentation

## 2022-11-17 ENCOUNTER — Encounter: Payer: Self-pay | Admitting: Psychology

## 2022-11-17 ENCOUNTER — Ambulatory Visit: Payer: Medicare Other | Admitting: Psychology

## 2022-11-17 ENCOUNTER — Ambulatory Visit (INDEPENDENT_AMBULATORY_CARE_PROVIDER_SITE_OTHER): Payer: Medicare Other | Admitting: Psychology

## 2022-11-17 DIAGNOSIS — G3184 Mild cognitive impairment, so stated: Secondary | ICD-10-CM

## 2022-11-17 DIAGNOSIS — G309 Alzheimer's disease, unspecified: Secondary | ICD-10-CM | POA: Insufficient documentation

## 2022-11-17 DIAGNOSIS — R4189 Other symptoms and signs involving cognitive functions and awareness: Secondary | ICD-10-CM

## 2022-11-17 HISTORY — DX: Mild cognitive impairment of uncertain or unknown etiology: G31.84

## 2022-11-17 HISTORY — DX: Alzheimer's disease, unspecified: G30.9

## 2022-11-17 NOTE — Progress Notes (Unsigned)
NEUROPSYCHOLOGICAL EVALUATION [place] Department of Neurology  Date of Evaluation: November 17, 2022  Reason for Referral:   Daniel Guerrero is a 77 y.o. right-handed Caucasian male referred by Daniel Kays, PA-C, to characterize his current cognitive functioning and assist with diagnostic clarity and treatment planning in the context of subjective cognitive decline.   Assessment and Plan:   Clinical Impression(s): Daniel Guerrero's pattern of performance is suggestive of a primary impairment surrounding all aspects of learning and memory. Additional impairments were exhibited across processing speed, executive functioning (i.e., cognitive flexibility and response inhibition), clock drawing, and semantic fluency. Performances were appropriate relative to age-matched peers across attention/concentration, verbal reasoning, safety/judgment, receptive language, phonemic fluency, confrontation naming, and other non-clock drawing aspects of visuospatial abilities. Daniel Guerrero generally denied difficulties completing instrumental activities of daily living (ADLs) independently. His wife was in agreement with this. As such, given evidence for cognitive dysfunction described above, he meets criteria for a Mild Neurocognitive Disorder ("mild cognitive impairment") at the present time.  The etiology for ongoing cognitive impairment is unclear. With that being said, I do have elevated concerns surrounding an underlying neurodegenerative illness, namely Alzheimer's disease. Across memory testing, Daniel Guerrero did not benefit from repeated learning opportunities, was fully amnestic (i.e., 0% retention) across two verbal memory measures after a brief delay and only exhibited 22% retention on a visual task, and performed very poorly across all yes/no recognition testing. Taken together, this suggests rapid forgetting and a pronounced storage impairment, both of which are hallmark  characteristics of this illness. His further semantic fluency impairment is also worrisome for typical disease progression. Testing patterns align well with day-to-day reporting of Daniel Guerrero's children noting that he is repetitive in conversation and commonly tells the same stories multiple times. Intact confrontation naming is encouraging and could suggest earlier disease staging if this illness is truly present. Mild microvascular ischemic disease and scattered frontoparietal microhemorrhages seen on his most recent brain MRI (2022) may exacerbate ongoing dysfunction. These findings would not explain amnestic memory performances across testing. Continued medical monitoring will be important moving forward.   Recommendations: A repeat neuropsychological evaluation in 18-24 months (or sooner if functional decline is noted) is recommended to assess the trajectory of future cognitive decline should it occur. This will also aid in future efforts towards improved diagnostic clarity.  Daniel Guerrero has already been prescribed a medication aimed to address memory loss and concerns surrounding Alzheimer's disease (i.e., memantine/Namenda). He is encouraged to continue taking this medication as prescribed. It is important to highlight that this medication has been shown to slow functional decline in some individuals. There is no current treatment which can stop or reverse cognitive decline when caused by a neurodegenerative illness.   Performance across neurocognitive testing is not a strong predictor of an individual's safety operating a motor vehicle. Should his family wish to pursue a formalized driving evaluation, they could reach out to the following agencies: [facility] [employer] [facility] in [place]: [phone] [facility]: [phone] [facility]: [phone] [facility]: [phone] or [phone]  Should there be progression of current deficits over time, [name] is unlikely to regain any independent living skills lost. Therefore, it is recommended that he remain as involved as possible in all aspects of household chores, finances, and medication management, with supervision to ensure adequate performance. He will likely benefit from the establishment and maintenance of a routine in order to maximize his functional abilities over time.  It  will be important for Daniel Guerrero to have another person with him when in situations where he may need to process information, weigh the pros and cons of different options, and make decisions, in order to ensure that he fully understands and recalls all information to be considered.  If not already done, Daniel Guerrero and his family may want to discuss his wishes regarding durable power of attorney and medical decision making, so that he can have input into these choices. If they require legal assistance with this, long-term care resource access, or other aspects of estate planning, they could reach out to [employer] at [phone] for a free consultation. Additionally, they may wish to discuss future plans for caretaking and seek out community options for in home/residential care should they become necessary.  Daniel Guerrero is encouraged to attend to lifestyle factors for brain health (e.g., regular physical exercise, good nutrition habits and consideration of the MIND-DASH diet, regular participation in cognitively-stimulating activities, and general stress management techniques), which are likely to have benefits for both emotional adjustment and cognition. Optimal control of vascular risk factors (including safe cardiovascular exercise and adherence to dietary recommendations) is encouraged. Continued participation in activities which provide mental stimulation and social interaction is also recommended.   Important information should be provided to Daniel Guerrero in written format in all instances. This  information should be placed in a highly frequented and easily visible location within his home to promote recall. External strategies such as written notes in a consistently used memory journal, visual and nonverbal auditory cues such as a calendar on the refrigerator or appointments with alarm, such as on a cell phone, can also help maximize recall.  To address problems with processing speed, he may wish to consider:   -Ensuring that he is alerted when essential material or instructions are being presented   -Adjusting the speed at which new information is presented   -Allowing for more time in comprehending, processing, and responding in conversation   -Repeating and paraphrasing instructions or conversations aloud  To address problems with fluctuating attention and/or executive dysfunction, he may wish to consider:   -Avoiding external distractions when needing to concentrate   -Limiting exposure to fast paced environments with multiple sensory demands   -Writing down complicated information and using checklists   -Attempting and completing one task at a time (i.e., no multi-tasking)   -Verbalizing aloud each step of a task to maintain focus   -Taking frequent breaks during the completion of steps/tasks to avoid fatigue   -Reducing the amount of information considered at one time   -Scheduling more difficult activities for a time of day where he is usually most alert  Review of Records:   Daniel Guerrero was most recently seen by [facility] Daniel Kays, PA-C) on 09/07/2012 for follow-up of ongoing memory concerns. Primary memory concerns surrounded him being more repetitive in conversation. At that appointment, he described his memory as "not getting any better." ADLs were described as intact. Performance on a brief cognitive screening instrument (MMSE) was 28/30. This was actually mildly improved from a 24/30 obtained on 03/06/2022. Ultimately, Daniel Guerrero was referred for a  comprehensive neuropsychological evaluation to characterize his cognitive abilities and to assist with diagnostic clarity and treatment planning.   Brain MRI on 12/02/2010 was unremarkable. Brain MRI on 01/25/2018 was unremarkable. Brain MRI on 02/16/2021 revealed mild microvascular ischemic changes, scattered microhemorrhages within the frontoparietal lobes said to reflect sequela of hypertensive microangiopathy, and mild generalized cerebral  atrophy.  Past Medical History:  Diagnosis Date   Allergic rhinitis 08/01/2010   Asthma with acute exacerbation 11/09/2014   Asymmetrical sensorineural hearing loss 02/06/2018   Left Ear   Benign paroxysmal positional vertigo 04/16/2014   BPH (benign prostatic hyperplasia)    Cataract of both eyes    implants both eyes    Contact dermatitis 12/13/2007   Dysphagia 08/04/2010   Eczema    Enlarged aorta (HCC)    Erectile dysfunction    GERD (gastroesophageal reflux disease) 11/26/2018   Headache 12/19/2016   Headache, common migraine, intractable 09/09/2010   History of colonic polyps 11/26/2018   History of inguinal hernia repair, bilateral    Hyperlipidemia 04/14/2016   Hypertension    Kidney stones    Meniere disease    Paroxysmal SVT (supraventricular tachycardia)    Psoriasis 12/13/2007   S/P appendectomy    S/P tonsillectomy    Sinus bradycardia 06/10/2015   Varicose veins of right lower extremity with complications 10/30/2016    Past Surgical History:  Procedure Laterality Date   Ablasion     APPENDECTOMY     CARDIAC ELECTROPHYSIOLOGY MAPPING AND ABLATION  2015   CHOLECYSTECTOMY     COLONOSCOPY     unsure where or when- states greater than 10 yrs ago per pt- was normal per pt and his wife    COLONOSCOPY WITH PROPOFOL N/A 08/26/2018   two tubular adenomas,  diverticulosis.  Repeat colonoscopy in February 2025 if overall health permits.   CYSTOSCOPY WITH RETROGRADE PYELOGRAM, URETEROSCOPY AND STENT PLACEMENT Left 12/30/2014    Procedure: CYSTOSCOPY WITH LEFT URETEROSCOPY STONE EXTRACTION WITH STENT;  Surgeon: Marcine Matar, MD;  Location: [facility];  Service: Urology;  Laterality: Left;   ELECTROPHYSIOLOGIC STUDY N/A 08/24/2015   Procedure: SVT Ablation;  Surgeon: Hillis Range, MD;  Location: [facility] INVASIVE CV LAB;  Service: Cardiovascular;  Laterality: N/A;   ENDOVENOUS ABLATION SAPHENOUS VEIN W/ LASER Right 12/04/2016   endovenous laser ablation right greater saphenous vein by Josephina Gip MD    ESOPHAGOGASTRODUODENOSCOPY N/A 08/18/2016   Procedure: ESOPHAGOGASTRODUODENOSCOPY (EGD);  Surgeon: Corbin Ade, MD;  Location: [facility];  Service: Endoscopy;  Laterality: N/A;   ESOPHAGOGASTRODUODENOSCOPY N/A 09/06/2016   Procedure: ESOPHAGOGASTRODUODENOSCOPY (EGD);  Surgeon: Corbin Ade, MD;  Location: [facility];  Service: Endoscopy;  Laterality: N/A;  100 - moved to 2/14 @ 2:15 per Darlina Rumpf   ESOPHAGOGASTRODUODENOSCOPY (EGD) WITH PROPOFOL N/A 08/26/2018   Mild Schatzki ring status post dilation, small hiatal hernia.   EYE SURGERY     Catracts removed both eye 30 yrs ago   HERNIA REPAIR     right and left   HOLMIUM LASER APPLICATION Left 12/30/2014   Procedure: HOLMIUM LASER APPLICATION;  Surgeon: Marcine Matar, MD;  Location: [facility];  Service: Urology;  Laterality: Left;   MALONEY DILATION N/A 09/06/2016   Procedure: Elease Hashimoto DILATION;  Surgeon: Corbin Ade, MD;  Location: [facility];  Service: Endoscopy;  Laterality: N/A;   MALONEY DILATION N/A 08/26/2018   Procedure: Elease Hashimoto DILATION;  Surgeon: Corbin Ade, MD;  Location: [facility];  Service: Endoscopy;  Laterality: N/A;   POLYPECTOMY  08/26/2018   Procedure: POLYPECTOMY;  Surgeon: Corbin Ade, MD;  Location: [facility];  Service: Endoscopy;;  polyp at descending colon x2   TONSILLECTOMY      Current Outpatient Medications:    amLODipine (NORVASC) 10 MG tablet, Take 1 tablet (10 mg total) by mouth daily., Disp: 90 tablet,  Rfl: 3    cetirizine (ZYRTEC) 10 MG tablet, Take 10 mg by mouth daily as needed for allergies. , Disp: , Rfl:    CRANBERRY POWDER PO, Take by mouth. 15,000 UNITS as needed, Disp: , Rfl:    Cyanocobalamin (VITAMIN B-12 PO), Take 1 tablet by mouth daily. , Disp: , Rfl:    desonide (DESOWEN) 0.05 % cream, Apply 1 application topically daily as needed (rash). , Disp: , Rfl:    diphenhydrAMINE HCl (BENADRYL ALLERGY PO), Take 1 tablet by mouth daily., Disp: , Rfl:    dutasteride (AVODART) 0.5 MG capsule, Take 1 capsule (0.5 mg total) by mouth daily., Disp: 90 capsule, Rfl: 3   EPINEPHrine 0.3 mg/0.3 mL IJ SOAJ injection, Inject 0.3 mLs (0.3 mg total) into the muscle as needed for anaphylaxis., Disp: 1 each, Rfl: 1   fluticasone (FLONASE) 50 MCG/ACT nasal spray, INSTILL TWO SPRAYS IN EACH NOSTRIL DAILY AS NEEDED FOR ALLERGIES, Disp: 48 g, Rfl: 1   hydrocortisone cream 1 %, Apply 1 application topically daily as needed for itching., Disp: , Rfl:    lansoprazole (PREVACID) 30 MG capsule, TAKE 1 CAPSULE BY MOUTH DAILY BEFORE BREAKFAST, Disp: 90 capsule, Rfl: 3   loperamide (IMODIUM) 2 MG capsule, Take by mouth as needed for diarrhea or loose stools. Patient takes every 2-3 days., Disp: , Rfl:    memantine (NAMENDA) 10 MG tablet, Take 1 tablet (10 mg)  twice a day, Disp: 60 tablet, Rfl: 11   Multiple Vitamin (ONE DAILY) tablet, Take by mouth., Disp: , Rfl:    Simethicone (GAS-X PO), Take 1 tablet by mouth daily as needed. , Disp: , Rfl:    vitamin C (ASCORBIC ACID) 500 MG tablet, Take 500 mg by mouth daily., Disp: , Rfl:   Clinical Interview:   The following information was obtained during a clinical interview with Daniel Guerrero and his wife prior to cognitive testing.  Cognitive Symptoms: Decreased short-term memory: Endorsed. He reported mild difficulties recalling names and misplacing objects around his residence. His wife added that he has trouble with short-term memory in general, as well as their children  informing him that he will repeat the same stories multiple times. Difficulties were said to be present for the past several years. He described difficulties as stable over time while his wife reported him having good and bad days.  Decreased long-term memory: Denied. Decreased attention/concentration: Endorsed. While never formally diagnosed, he reported it being "clear to me" that he has prominent traits of ADHD. He reported longstanding difficulties with sustained attention and distractibility throughout his life, including early academic settings. These difficulties have more or less persisted to present day; however, he has discovered compensatory strategies over the years which have helped him function over time.  Reduced processing speed: Denied. Difficulties with executive functions: Endorsed. He reported mild and longstanding difficulties with multi-tasking and organization. However, to combat this, he described himself as a purposefully systematic individual which helps improve day-to-day organization. They denied trouble with impulsivity or any significant personality changes.  Difficulties with emotion regulation: Denied. Difficulties with receptive language: Denied. Difficulties with word finding: Denied. Decreased visuoperceptual ability: Denied.  Difficulties completing ADLs: Denied. His wife did report some trouble with inattention impacting driving performances (i.e., missing a turn or turning right when he was instructed to turn left).   Additional Medical History: History of traumatic brain injury/concussion: Endorsed. As a child, he reportedly sustained a head impact causing a loss of consciousness of unknown duration. He reported waking  while already receiving medical care. No persisting difficulties stemming from this event were reported. No more recent head injuries were described.  History of stroke: Denied. History of seizure activity: Denied. History of known exposure to  toxins: Denied. Symptoms of chronic pain: Denied. Experience of frequent headaches/migraines: Denied. Frequent instances of dizziness/vertigo: Denied.  Sensory changes: He has some floaters in his visual field but generally denied trouble with visual acuity. He utilizes hearing aids with benefit. Other sensory changes/difficulties (e.g., taste or smell) were denied. Balance/coordination difficulties: Largely denied. He described his balance as "not what it used to be" and did report being more cautious and more prone to hold onto things in his environment while ambulating. He denied any recent falls and one side of the body was not said to be weaker or less stable relative to the other.  Other motor difficulties: Denied.  Sleep History: Estimated hours obtained each night: 7-8 hours.  Difficulties falling asleep: Denied. Difficulties staying asleep: Denied outside of waking several times to use the restroom throughout the night.  Feels rested and refreshed upon awakening: Endorsed.  History of snoring: Endorsed. Symptoms were said to be mild.  History of waking up gasping for air: Denied. Witnessed breath cessation while asleep: Denied.  History of vivid dreaming: Endorsed. He noted that dreams had recently seemed "enhanced." This was attributed to medication changes.  Excessive movement while asleep: Denied. Instances of acting out his dreams: Denied.  Psychiatric/Behavioral Health History: Depression: He described his mood as "positive" and denied to his knowledge any prior mental health concerns or diagnoses. Current or remote suicidal ideation, intent, or plan was denied.  Anxiety: Denied. Mania: Denied. Trauma History: Denied. Visual/auditory hallucinations: Denied. Delusional thoughts: Denied.  Tobacco: Denied. Alcohol: He reported consuming 1-2 beers or scotch drinks a couple nights per week. He denied a history of problematic alcohol abuse or dependence.  Recreational drugs:  Denied.  Family History: Problem Relation Age of Onset   Cancer Mother        lung   Thyroid disease Mother    Diabetes Father    GI Bleed Father    Aneurysm Father    AAA (abdominal aortic aneurysm) Father    AAA (abdominal aortic aneurysm) Brother    Colon cancer Neg Hx    Colon polyps Neg Hx    Rectal cancer Neg Hx    Stomach cancer Neg Hx    This information was confirmed by Daniel Guerrero.  Academic/Vocational History: Highest level of educational attainment: 16 years. He graduated from high school and earned a Oncologist in business administration. He emphasized that he "always struggled" throughout academic settings and described notable traits of both ADHD and dyslexia. Despite the difficulty, he reported being successful by often finding alternate ways to learn or process information and has relied on compensatory strategies over time.  History of developmental delay: Denied. History of grade repetition: Denied. Enrollment in special education courses: Denied.  Employment: Retired. 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   Evaluation Results:   Behavioral Observations: Daniel Guerrero was accompanied by his wife, arrived to his appointment on time, and was appropriately dressed and groomed. He appeared alert. Observed gait and station were within normal limits. Gross motor functioning appeared intact upon informal observation and no abnormal movements (e.g., tremors) were noted. His affect was generally relaxed and positive, but did range appropriately  given the subject being discussed during the clinical interview or the task at hand during testing procedures. Spontaneous speech was fluent and word finding difficulties were not observed during the clinical interview. Thought processes were coherent, organized, and normal in content. Insight into his  cognitive difficulties appeared fairly adequate. However, he may not fully appreciate the true extent of ongoing memory impairment seen across objective cognitive testing.   During testing, sustained attention was appropriate. Task engagement was adequate and he persisted when challenged. Overall, Daniel Guerrero was cooperative with the clinical interview and subsequent testing procedures.   Adequacy of Effort: The validity of neuropsychological testing is limited by the extent to which the individual being tested may be assumed to have exerted adequate effort during testing. Daniel Guerrero expressed his intention to perform to the best of his abilities and exhibited adequate task engagement and persistence. Scores across stand-alone and embedded performance validity measures were within expectation. As such, the results of the current evaluation are believed to be a valid representation of Daniel Guerrero's current cognitive functioning.  Test Results: Daniel Guerrero was largely oriented at the time of the current evaluation. He was unable to state his phone number outside of his area code. He also was unable to state the name of the current clinic.   Intellectual abilities based upon educational and vocational attainment were estimated to be in the average range. Premorbid abilities were estimated to be within the average range based upon a single-word reading test.   Processing speed was largely exceptionally low to well below average. He did have a single average performance across a rapid word reading task. Basic attention was average. More complex attention (e.g., working memory) was also average. Executive functioning was largely exceptionally low. He did perform in the average range across a task assessing verbal reasoning. He performed in the well above average range across a task assessing safety and judgment.   Assessed receptive language abilities were average. Likewise, Mr. Casique did not exhibit  any difficulties comprehending task instructions and answered all questions asked of him appropriately. Assessed expressive language was variable. Phonemic fluency was below average to average, semantic fluency was exceptionally low to well below average, and confrontation naming was average to above average.    Assessed visuospatial/visuoconstructional abilities were average to above average outside of his drawing of a clock. Points were lost on his drawing of a clock due to the numbers being placed in counter-clockwise fashion, as well as incorrect hand placement.    Learning (i.e., encoding) of novel verbal information was exceptionally low to well below average. Spontaneous delayed recall (i.e., retrieval) of previously learned information was also exceptionally low to well below average. Retention rates were 0% across a story learning task, 0% across a list learning task, and 22% across a figure drawing task. Performance across recognition tasks was exceptionally low to well below average, suggesting negligible evidence for information consolidation.   Results of emotional screening instruments suggested that recent symptoms of generalized anxiety were in the minimal range, while symptoms of depression were within normal limits. A screening instrument assessing recent sleep quality suggested the presence of minimal sleep dysfunction.  Tables of Scores:   Note: This summary of test scores accompanies the interpretive report and should not be considered in isolation without reference to the appropriate sections in the text. Descriptors are based on appropriate normative data and may be adjusted based on clinical judgment. Terms such as "Within Normal Limits" and "Outside Normal Limits" are used when  a more specific description of the test score cannot be determined.       Percentile - Normative Descriptor > 98 - Exceptionally High 91-97 - Well Above Average 75-90 - Above Average 25-74 -  Average 9-24 - Below Average 2-8 - Well Below Average < 2 - Exceptionally Low       Orientation:      Raw Score Percentile   NAB Orientation, Form 1 26/29 --- ---       Cognitive Screening:      Raw Score Percentile   SLUMS: 9/30 --- ---       RBANS, Form A: Standard Score/ Scaled Score Percentile   Total Score 68 2 Well Below Average  Immediate Memory 65 1 Exceptionally Low    List Learning 5 5 Well Below Average    Story Memory 3 1 Exceptionally Low  Visuospatial/Constructional 92 30 Average    Figure Copy 10 50 Average    Line Orientation 15/20 26-50 Average  Language 85 16 Below Average    Picture Naming 10/10 51-75 Average    Semantic Fluency 4 2 Well Below Average  Attention 85 16 Below Average    Digit Span 11 63 Average    Coding 4 2 Well Below Average  Delayed Memory 48 <1 Exceptionally Low    List Recall 0/10 <2 Exceptionally Low    List Recognition 14/20 <2 Exceptionally Low    Story Recall 1 <1 Exceptionally Low    Story Recognition 4/12 <1 Exceptionally Low    Figure Recall 4 2 Well Below Average    Figure Recognition 3/8 4-8 Well Below Average        Intellectual Functioning:      Standard Score Percentile   Test of Premorbid Functioning: 104 61 Average       Attention/Executive Function:     Trail Making Test (TMT): Raw Score (T Score) Percentile     Part A 103 secs.,  0 errors (18) <1 Exceptionally Low    Part B Discontinued --- Impaired         Scaled Score Percentile   WAIS-IV Digit Span: 8 25 Average    Forward 8 25 Average    Backward 9 37 Average    Sequencing 8 25 Average        Scaled Score Percentile   WAIS-IV Similarities: 11 63 Average       D-KEFS Color-Word Interference Test: Raw Score (Scaled Score) Percentile     Color Naming 45 secs. (5) 5 Well Below Average    Word Reading 25 secs. (10) 50 Average    Inhibition 131 secs. (1) <1 Exceptionally Low      Total Errors 6 errors (7) 16 Below Average    Inhibition/Switching 128  secs. (3) 1 Exceptionally Low      Total Errors 2 errors (11) 63 Average       D-KEFS Verbal Fluency Test: Raw Score (Scaled Score) Percentile     Letter Total Correct 30 (9) 37 Average    Category Total Correct 21 (5) 5 Well Below Average    Category Switching Total Correct 3 (1) <1 Exceptionally Low    Category Switching Accuracy 1 (1) <1 Exceptionally Low      Total Set Loss Errors 3 (9) 37 Average      Total Repetition Errors 5 (8) 25 Average       NAB Executive Functions Module, Form 1: T Score Percentile     Judgment 64 92 Well Above  Average       Language:     Verbal Fluency Test: Raw Score (T Score) Percentile     Phonemic Fluency (FAS) 30 (42) 21 Below Average    Animal Fluency 9 (27) 1 Exceptionally Low        NAB Language Module, Form 1: T Score Percentile     Auditory Comprehension 53 62 Average    Naming 30/31 (58) 79 Above Average       Visuospatial/Visuoconstruction:      Raw Score Percentile   Clock Drawing: 6/10 --- Impaired        Scaled Score Percentile   WAIS-IV Block Design: 12 75 Above Average       Mood and Personality:      Raw Score Percentile   PROMIS Depression Questionnaire: 8 --- None to Slight  PROMIS Anxiety Questionnaire: 7 --- None to Slight       Additional Questionnaires:      Raw Score Percentile   PROMIS Sleep Disturbance Questionnaire: 10 --- None to Slight   Informed Consent and Coding/Compliance:   The current evaluation represents a clinical evaluation for the purposes previously outlined by the referral source and is in no way reflective of a forensic evaluation.   Mr. Mehlhoff was provided with a verbal description of the nature and purpose of the present neuropsychological evaluation. Also reviewed were the foreseeable risks and/or discomforts and benefits of the procedure, limits of confidentiality, and mandatory reporting requirements of this provider. The patient was given the opportunity to ask questions and receive answers  about the evaluation. Oral consent to participate was provided by the patient.   This evaluation was conducted by Newman Nickels, Ph.D., ABPP-CN, board certified clinical neuropsychologist. Mr. Leedy completed a clinical interview with Dr. Milbert Coulter, billed as one unit [phone], and 135 minutes of cognitive testing and scoring, billed as one unit [phone] and four additional units [id number]. Psychometrist Wallace Keller, B.S., assisted Dr. Milbert Coulter with test administration and scoring procedures. As a separate and discrete service, one unit [id number] and two units [phone] were billed for Dr. Tammy Sours time spent in interpretation and report writing.

## 2022-11-17 NOTE — Progress Notes (Signed)
   Psychometrician Note   Cognitive testing was administered to Alger Memos by Wallace Keller, B.S. (psychometrist) under the supervision of Dr. Newman Nickels, Ph.D., licensed psychologist on 11/17/2022. Mr. Manus did not appear overtly distressed by the testing session per behavioral observation or responses across self-report questionnaires. Rest breaks were offered.    The battery of tests administered was selected by Dr. Newman Nickels, Ph.D. with consideration to Mr. Altemose's current level of functioning, the nature of his symptoms, emotional and behavioral responses during interview, level of literacy, observed level of motivation/effort, and the nature of the referral question. This battery was communicated to the psychometrist. Communication between Dr. Newman Nickels, Ph.D. and the psychometrist was ongoing throughout the evaluation and Dr. Newman Nickels, Ph.D. was immediately accessible at all times. Dr. Newman Nickels, Ph.D. provided supervision to the psychometrist on the date of this service to the extent necessary to assure the quality of all services provided.    BRAX WALEN will return within approximately 1-2 weeks for an interactive feedback session with Dr. Milbert Coulter at which time his test performances, clinical impressions, and treatment recommendations will be reviewed in detail. Mr. Halbert understands he can contact our office should he require our assistance before this time.  A total of 135 minutes of billable time were spent face-to-face with Mr. Beale by the psychometrist. This includes both test administration and scoring time. Billing for these services is reflected in the clinical report generated by Dr. Newman Nickels, Ph.D.  This note reflects time spent with the psychometrician and does not include test scores or any clinical interpretations made by Dr. Milbert Coulter. The full report will follow in a separate note.

## 2022-11-20 ENCOUNTER — Encounter: Payer: Self-pay | Admitting: Psychology

## 2022-11-24 ENCOUNTER — Ambulatory Visit (INDEPENDENT_AMBULATORY_CARE_PROVIDER_SITE_OTHER): Payer: Medicare Other | Admitting: Psychology

## 2022-11-24 DIAGNOSIS — G3184 Mild cognitive impairment, so stated: Secondary | ICD-10-CM

## 2022-11-24 NOTE — Progress Notes (Signed)
   Neuropsychology Feedback Session [name]. [facility]  Department of Neurology  Reason for Referral:   Daniel Guerrero is a 77 y.o. right-handed Caucasian male referred by Marlowe Kays, PA-C, to characterize his current cognitive functioning and assist with diagnostic clarity and treatment planning in the context of subjective cognitive decline.   Feedback:   Daniel Guerrero completed a comprehensive neuropsychological evaluation on 11/17/2022. Please refer to that encounter for the full report and recommendations. Briefly, results suggested a primary impairment surrounding all aspects of learning and memory. Additional impairments were exhibited across processing speed, executive functioning (i.e., cognitive flexibility and response inhibition), clock drawing, and semantic fluency. The etiology for ongoing cognitive impairment is unclear. With that being said, I do have elevated concerns surrounding an underlying neurodegenerative illness, namely Alzheimer's disease. Across memory testing, Daniel Guerrero did not benefit from repeated learning opportunities, was fully amnestic (i.e., 0% retention) across two verbal memory measures after a brief delay and only exhibited 22% retention on a visual task, and performed very poorly across all yes/no recognition testing. Taken together, this suggests rapid forgetting and a pronounced storage impairment, both of which are hallmark characteristics of this illness. His further semantic fluency impairment is also worrisome for typical disease progression. Testing patterns align well with day-to-day reporting of Daniel Guerrero's children noting that he is repetitive in conversation and commonly tells the same stories multiple times. Intact confrontation naming is encouraging and could suggest earlier disease staging if this illness is truly present. Mild microvascular ischemic disease and scattered frontoparietal microhemorrhages seen on his most recent  brain MRI (2022) may exacerbate ongoing dysfunction. These findings would not explain amnestic memory performances across testing. Continued medical monitoring will be important moving forward.   Daniel Guerrero was accompanied by his wife and son [name] during the current feedback session. Content of the current session focused on the results of his neuropsychological evaluation. Daniel Guerrero was given the opportunity to ask questions and his questions were answered. He was encouraged to reach out should additional questions arise. A copy of his report was provided/mailed at the conclusion of the visit.      One unit [phone] was billed for Dr. Tammy Sours time spent preparing for, conducting, and documenting the current feedback session with Daniel Guerrero.

## 2022-12-14 ENCOUNTER — Encounter: Payer: Self-pay | Admitting: Adult Health

## 2022-12-14 ENCOUNTER — Ambulatory Visit (INDEPENDENT_AMBULATORY_CARE_PROVIDER_SITE_OTHER): Payer: Medicare Other | Admitting: Adult Health

## 2022-12-14 VITALS — BP 138/80 | HR 82 | Temp 97.5°F | Ht 69.0 in | Wt 200.0 lb

## 2022-12-14 DIAGNOSIS — I10 Essential (primary) hypertension: Secondary | ICD-10-CM | POA: Diagnosis not present

## 2022-12-14 DIAGNOSIS — L309 Dermatitis, unspecified: Secondary | ICD-10-CM

## 2022-12-14 MED ORDER — AMLODIPINE BESYLATE 10 MG PO TABS: 10.0000 mg | ORAL_TABLET | Freq: Every day | ORAL | 3 refills | Status: DC

## 2022-12-14 MED ORDER — TRIAMCINOLONE ACETONIDE 0.5 % EX OINT: 1.0000 | TOPICAL_OINTMENT | Freq: Two times a day (BID) | CUTANEOUS | 1 refills | Status: AC

## 2022-12-14 MED ORDER — KETOCONAZOLE 2 % EX SHAM: 1.0000 | MEDICATED_SHAMPOO | CUTANEOUS | 0 refills | Status: AC

## 2022-12-14 NOTE — Progress Notes (Signed)
Subjective:    Patient ID: Daniel Guerrero, male    DOB: 07-21-1946, 77 y.o.   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), Asthma with acute exacerbation (11/09/2014), Asymmetrical sensorineural hearing loss (02/06/2018), Benign paroxysmal positional vertigo (04/16/2014), BPH (benign prostatic hyperplasia), Cataract of both eyes, Contact dermatitis (12/13/2007), Dysphagia (08/04/2010), Eczema, Enlarged aorta (HCC), Erectile dysfunction, GERD (gastroesophageal reflux disease) (11/26/2018), Headache (12/19/2016), Headache, common migraine, intractable (09/09/2010), History of colonic polyps (11/26/2018), History of inguinal hernia repair, bilateral, Hyperlipidemia (04/14/2016), Hypertension, Kidney stones, Meniere disease, Paroxysmal SVT (supraventricular tachycardia), Psoriasis (12/13/2007), S/P appendectomy, S/P tonsillectomy, Sinus bradycardia (06/10/2015), and Varicose veins of right lower extremity with complications (10/30/2016).  History of Eczema. Currently having a " flare up". He has been using OTC cortisone cream which helps alleviate some of the redness and itching but does not resolve it. Currently has eczema on his scalp and behind both ears. He would like a referral to dermatology   He also needs a refill of Norvasc   Review of Systems See HPI   Past Medical History:  Diagnosis Date   Allergic rhinitis 08/01/2010   Amnestic MCI (mild cognitive impairment with memory loss) 11/17/2022   Asthma with acute exacerbation 11/09/2014   Asymmetrical sensorineural hearing loss 02/06/2018   Left Ear   Benign paroxysmal positional vertigo 04/16/2014   BPH (benign prostatic hyperplasia)    Cataract of both eyes    implants both eyes    Contact dermatitis 12/13/2007   Dysphagia 08/04/2010   Eczema    Enlarged aorta (HCC)    Erectile dysfunction    GERD  (gastroesophageal reflux disease) 11/26/2018   Headache 12/19/2016   Headache, common migraine, intractable 09/09/2010   History of colonic polyps 11/26/2018   History of inguinal hernia repair, bilateral    Hyperlipidemia 04/14/2016   Hypertension    Kidney stones    Meniere disease    Paroxysmal SVT (supraventricular tachycardia)    Psoriasis 12/13/2007   S/P appendectomy    S/P tonsillectomy    Sinus bradycardia 06/10/2015   Varicose veins of right lower extremity with complications 10/30/2016    Social History   Socioeconomic History   Marital status: Married    Spouse name: Not on file   Number of children: 3   Years of education: 16   Highest education level: Bachelor's degree (e.g., BA, AB, BS)  Occupational History   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  Tobacco Use   Smoking status: Former    Types: Cigarettes    Quit date: 07/24/1964    Years since quitting: 58.4   Smokeless tobacco: Never   Tobacco comments:    Quit 20-30 years ago; smoked pipe and cigars   Vaping Use   Vaping Use: Never used  Substance and Sexual Activity   Alcohol use: Yes    Alcohol/week: 2.0 standard drinks of alcohol    Types: 2 Cans of beer per week    Comment: 2 per day   Drug use: No   Sexual activity: Not on file  Other Topics Concern   Not on file  Social History Narrative   Daily Caffeine use: 5-6 drinks daily      08/07/2018: No drug use, some daily alcohol use at night but patient does not feel like it disrupts his health or is a safety concern.  Lives with wife in two story home; three sons, five grandchildren in [place] area.   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    Right handed            Social Determinants of Health   Financial Resource Strain: Low Risk  (09/22/2022)   Overall Financial Resource Strain (CARDIA)    Difficulty of Paying Living Expenses: Not hard at all  Food  Insecurity: No Food Insecurity (09/22/2022)   Hunger Vital Sign    Worried About Running Out of Food in the Last Year: Never true    Ran Out of Food in the Last Year: Never true  Transportation Needs: No Transportation Needs (09/22/2022)   PRAPARE - Administrator, Civil Service (Medical): No    Lack of Transportation (Non-Medical): No  Physical Activity: Insufficiently Active (09/22/2022)   Exercise Vital Sign    Days of Exercise per Week: 4 days    Minutes of Exercise per Session: 20 min  Stress: No Stress Concern Present (09/22/2022)   [name]    Feeling of Stress : Not at all  Social Connections: Socially Integrated (09/22/2022)   Social Connection and Isolation Panel [NHANES]    Frequency of Communication with Friends and Family: More than three times a week    Frequency of Social Gatherings with Friends and Family: More than three times a week    Attends Religious Services: More than 4 times per year    Active Member of [employer] or Organizations: Yes    Attends Engineer, structural: More than 4 times per year    Marital Status: Married  Catering manager Violence: Not At Risk (09/22/2022)   Humiliation, Afraid, Rape, and Kick questionnaire    Fear of Current or Ex-Partner: No    Emotionally Abused: No    Physically Abused: No    Sexually Abused: No    Past Surgical History:  Procedure Laterality Date   Ablasion     APPENDECTOMY     CARDIAC ELECTROPHYSIOLOGY MAPPING AND ABLATION  2015   CHOLECYSTECTOMY     COLONOSCOPY     unsure where or when- states greater than 10 yrs ago per pt- was normal per pt and his wife    COLONOSCOPY WITH PROPOFOL N/A 08/26/2018   two tubular adenomas,  diverticulosis.  Repeat colonoscopy in February 2025 if overall health permits.   CYSTOSCOPY WITH RETROGRADE PYELOGRAM, URETEROSCOPY AND STENT PLACEMENT Left 12/30/2014   Procedure: CYSTOSCOPY WITH LEFT URETEROSCOPY STONE  EXTRACTION WITH STENT;  Surgeon: Marcine Matar, MD;  Location: [facility];  Service: Urology;  Laterality: Left;   ELECTROPHYSIOLOGIC STUDY N/A 08/24/2015   Procedure: SVT Ablation;  Surgeon: Hillis Range, MD;  [facility] INVASIVE CV LAB;  Service: Cardiovascular;  Laterality: N/A;   ENDOVENOUS ABLATION SAPHENOUS VEIN W/ LASER Right 12/04/2016   endovenous laser ablation right greater saphenous vein by Josephina Gip MD    ESOPHAGOGASTRODUODENOSCOPY N/A 08/18/2016   Procedure: ESOPHAGOGASTRODUODENOSCOPY (EGD);  Surgeon: Corbin Ade, MD;  Location: [facility];  Service: Endoscopy;  Laterality: N/A;   ESOPHAGOGASTRODUODENOSCOPY N/A 09/06/2016   Procedure: ESOPHAGOGASTRODUODENOSCOPY (EGD);  Surgeon: Corbin Ade, MD;  Location: [facility];  Service: Endoscopy;  Laterality: N/A;  100 - moved to 2/14 @ 2:15 per Darlina Rumpf   ESOPHAGOGASTRODUODENOSCOPY (EGD) WITH PROPOFOL N/A 08/26/2018   Mild Schatzki ring status post dilation, small hiatal hernia.  EYE SURGERY     Catracts removed both eye 30 yrs ago   HERNIA REPAIR     right and left   HOLMIUM LASER APPLICATION Left 12/30/2014   Procedure: HOLMIUM LASER APPLICATION;  Surgeon: Marcine Matar, MD;  Location: [facility];  Service: Urology;  Laterality: Left;   MALONEY DILATION N/A 09/06/2016   Procedure: Elease Hashimoto DILATION;  Surgeon: Corbin Ade, MD;  Location: [facility];  Service: Endoscopy;  Laterality: N/A;   MALONEY DILATION N/A 08/26/2018   Procedure: Elease Hashimoto DILATION;  Surgeon: Corbin Ade, MD;  Location: [facility];  Service: Endoscopy;  Laterality: N/A;   POLYPECTOMY  08/26/2018   Procedure: POLYPECTOMY;  Surgeon: Corbin Ade, MD;  Location: [facility];  Service: Endoscopy;;  polyp at descending colon x2   TONSILLECTOMY      Family History  Problem Relation Age of Onset   Cancer Mother        lung   Thyroid disease Mother    Diabetes Father    GI Bleed Father    Aneurysm Father    AAA (abdominal aortic  aneurysm) Father    AAA (abdominal aortic aneurysm) Brother    Colon cancer Neg Hx    Colon polyps Neg Hx    Rectal cancer Neg Hx    Stomach cancer Neg Hx     Allergies  Allergen Reactions   Peanut-Containing Drug Products Hives   Penicillins Hives    Has patient had a PCN reaction causing immediate rash, facial/tongue/throat swelling, SOB or lightheadedness with hypotension: No Has patient had a PCN reaction causing severe rash involving mucus membranes or skin necrosis: No Has patient had a PCN reaction that required hospitalization No Has patient had a PCN reaction occurring within the last 10 years: No If all of the above answers are "NO", then may proceed with Cephalosporin use.   Quinolones     Patient was warned about not using Cipro and similar antibiotics. Recent studies have raised concern that fluoroquinolone antibiotics could be associated with an increased risk of aortic aneurysm Fluoroquinolones have non-antimicrobial properties that might jeopardise the integrity of the extracellular matrix of the vascular wall In a  propensity score matched cohort study in [place], there was a 66% increased rate of aortic aneurysm or dissection associated with oral fluoroquinolone use, compared wit    Current Outpatient Medications on File Prior to Visit  Medication Sig Dispense Refill   amLODipine (NORVASC) 10 MG tablet Take 1 tablet (10 mg total) by mouth daily. 90 tablet 3   cetirizine (ZYRTEC) 10 MG tablet Take 10 mg by mouth daily as needed for allergies.      CRANBERRY POWDER PO Take by mouth. 15,000 UNITS as needed     Cyanocobalamin (VITAMIN B-12 PO) Take 1 tablet by mouth daily.      desonide (DESOWEN) 0.05 % cream Apply 1 application topically daily as needed (rash).      diphenhydrAMINE HCl (BENADRYL ALLERGY PO) Take 1 tablet by mouth daily.     dutasteride (AVODART) 0.5 MG capsule Take 1 capsule (0.5 mg total) by mouth daily. 90 capsule 3   EPINEPHrine 0.3 mg/0.3 mL IJ SOAJ  injection Inject 0.3 mLs (0.3 mg total) into the muscle as needed for anaphylaxis. 1 each 1   fluticasone (FLONASE) 50 MCG/ACT nasal spray INSTILL TWO SPRAYS IN EACH NOSTRIL DAILY AS NEEDED FOR ALLERGIES 48 g 1   hydrocortisone cream 1 % Apply 1 application topically daily as needed for  itching.     lansoprazole (PREVACID) 30 MG capsule TAKE 1 CAPSULE BY MOUTH DAILY BEFORE BREAKFAST 90 capsule 3   loperamide (IMODIUM) 2 MG capsule Take by mouth as needed for diarrhea or loose stools. Patient takes every 2-3 days.     memantine (NAMENDA) 10 MG tablet Take 1 tablet (10 mg)  twice a day 60 tablet 11   Multiple Vitamin (ONE DAILY) tablet Take by mouth.     Simethicone (GAS-X PO) Take 1 tablet by mouth daily as needed.      vitamin C (ASCORBIC ACID) 500 MG tablet Take 500 mg by mouth daily.     No current facility-administered medications on file prior to visit.    BP 138/80   Pulse 82   Temp (!) 97.5 F (36.4 C) (Oral)   Ht 5\' 9"  (1.753 m)   Wt 200 lb (90.7 kg)   SpO2 94%   BMI 29.53 kg/m       Objective:   Physical Exam Vitals and nursing note reviewed.  Constitutional:      Appearance: Normal appearance.  Cardiovascular:     Rate and Rhythm: Normal rate and regular rhythm.     Pulses: Normal pulses.     Heart sounds: Normal heart sounds.  Pulmonary:     Breath sounds: Normal breath sounds.  Musculoskeletal:        General: Normal range of motion.  Skin:    General: Skin is warm.     Findings: Rash (red scaley rash on top of head and behind both ears) present.  Neurological:     General: No focal deficit present.     Mental Status: He is alert and oriented to person, place, and time.  Psychiatric:        Mood and Affect: Mood normal.        Behavior: Behavior normal.        Thought Content: Thought content normal.        Judgment: Judgment normal.        Assessment & Plan:  1. Eczema, unspecified type  - Ambulatory referral to Dermatology - ketoconazole (NIZORAL)  2 % shampoo; Apply 1 Application topically 2 (two) times a week.  Dispense: 120 mL; Refill: 0 - triamcinolone ointment (KENALOG) 0.5 %; Apply 1 Application topically 2 (two) times daily.  Dispense: 45 g; Refill: 1  2. Essential hypertension  - amLODipine (NORVASC) 10 MG tablet; Take 1 tablet (10 mg total) by mouth daily.  Dispense: 90 tablet; Refill: 3

## 2022-12-14 NOTE — Patient Instructions (Addendum)
It was great seeing you today   I am going to send in a medicated shampoo for you to use twice a week. You can use the cortisone cream as needed   Someone from dermatology will call you to schedule your exam

## 2023-01-02 ENCOUNTER — Ambulatory Visit: Payer: Medicare Other | Admitting: Gastroenterology

## 2023-01-16 ENCOUNTER — Encounter: Payer: Self-pay | Admitting: Gastroenterology

## 2023-01-16 ENCOUNTER — Ambulatory Visit (INDEPENDENT_AMBULATORY_CARE_PROVIDER_SITE_OTHER): Payer: Medicare Other | Admitting: Gastroenterology

## 2023-01-16 VITALS — BP 133/72 | HR 63 | Temp 98.6°F | Ht 70.0 in | Wt 198.8 lb

## 2023-01-16 DIAGNOSIS — R5383 Other fatigue: Secondary | ICD-10-CM | POA: Diagnosis not present

## 2023-01-16 DIAGNOSIS — R197 Diarrhea, unspecified: Secondary | ICD-10-CM | POA: Diagnosis not present

## 2023-01-16 DIAGNOSIS — R630 Anorexia: Secondary | ICD-10-CM

## 2023-01-16 DIAGNOSIS — K219 Gastro-esophageal reflux disease without esophagitis: Secondary | ICD-10-CM | POA: Diagnosis not present

## 2023-01-16 NOTE — Patient Instructions (Signed)
I recommend taking 2 teaspoons of Benefiber in your beverage of choice each morning.  Please have blood work done. We will be in touch through MyChart with results!  I enjoyed seeing you again today! I value our relationship and want to provide genuine, compassionate, and quality care. You may receive a survey regarding your visit with me, and I welcome your feedback! Thanks so much for taking the time to complete this. I look forward to seeing you again.      Gelene Mink, PhD, ANP-BC [facility]

## 2023-01-16 NOTE — Progress Notes (Signed)
Gastroenterology Office Note     Primary Care Physician:  Shirline Frees, NP  Primary Gastroenterologist: Dr. Jena Gauss    Chief Complaint   Chief Complaint  Patient presents with   Follow-up    One year follow up on GERD     History of Present Illness   Daniel Guerrero is a 77 y.o. male presenting today with a history of chronic GERD, Schatzki ring s/p dilation last in 2020, and tubular adenomas with surveillance due in 2025 if health permits. He was last seen a year ago for routine visit.    Has loose stool off and on chronically. Will take Imodium as needed. Sometimes will take a bit before it kicks in.   Doesn't have an appetite. Doesn't want to eat. Will eat small amount. No postprandial abdominal pain. No abdominal pain. Very tired. Feels like he needs to sleep in afternoons after being active. Has been without an appetite for weaks. Had small amount of blood in stool the other day. Main concerns is very tired and doesn't want ot eat. No significant medication changes. Has early satiety.    Will go day or 2 without BM and will take something if goes too long without BM. Diarrhea will seem to come on without any pattern.    Weight in Dec 2023: 207 August 2023 199   Weight fluctuates    Past Medical History:  Diagnosis Date   Allergic rhinitis 08/01/2010   Amnestic MCI (mild cognitive impairment with memory loss) 11/17/2022   Asthma with acute exacerbation 11/09/2014   Asymmetrical sensorineural hearing loss 02/06/2018   Left Ear   Benign paroxysmal positional vertigo 04/16/2014   BPH (benign prostatic hyperplasia)    Cataract of both eyes    implants both eyes    Contact dermatitis 12/13/2007   Dysphagia 08/04/2010   Eczema    Enlarged aorta (HCC)    Erectile dysfunction    GERD (gastroesophageal reflux disease) 11/26/2018   Headache 12/19/2016   Headache, common migraine, intractable 09/09/2010   History of colonic polyps 11/26/2018   History of  inguinal hernia repair, bilateral    Hyperlipidemia 04/14/2016   Hypertension    Kidney stones    Meniere disease    Paroxysmal SVT (supraventricular tachycardia)    Psoriasis 12/13/2007   S/P appendectomy    S/P tonsillectomy    Sinus bradycardia 06/10/2015   Varicose veins of right lower extremity with complications 10/30/2016    Past Surgical History:  Procedure Laterality Date   Ablasion     APPENDECTOMY     CARDIAC ELECTROPHYSIOLOGY MAPPING AND ABLATION  2015   CHOLECYSTECTOMY     COLONOSCOPY     unsure where or when- states greater than 10 yrs ago per pt- was normal per pt and his wife    COLONOSCOPY WITH PROPOFOL N/A 08/26/2018   two tubular adenomas,  diverticulosis.  Repeat colonoscopy in February 2025 if overall health permits.   CYSTOSCOPY WITH RETROGRADE PYELOGRAM, URETEROSCOPY AND STENT PLACEMENT Left 12/30/2014   Procedure: CYSTOSCOPY WITH LEFT URETEROSCOPY STONE EXTRACTION WITH STENT;  Surgeon: Marcine Matar, MD;  Location: [facility];  Service: Urology;  Laterality: Left;   ELECTROPHYSIOLOGIC STUDY N/A 08/24/2015   Procedure: SVT Ablation;  Surgeon: Hillis Range, MD;  Location: [facility] INVASIVE CV LAB;  Service: Cardiovascular;  Laterality: N/A;   ENDOVENOUS ABLATION SAPHENOUS VEIN W/ LASER Right 12/04/2016   endovenous laser ablation right greater saphenous vein by Josephina Gip MD    ESOPHAGOGASTRODUODENOSCOPY N/A 08/18/2016  Procedure: ESOPHAGOGASTRODUODENOSCOPY (EGD);  Surgeon: Corbin Ade, MD;  Location: [facility];  Service: Endoscopy;  Laterality: N/A;   ESOPHAGOGASTRODUODENOSCOPY N/A 09/06/2016   Procedure: ESOPHAGOGASTRODUODENOSCOPY (EGD);  Surgeon: Corbin Ade, MD;  Location: [facility];  Service: Endoscopy;  Laterality: N/A;  100 - moved to 2/14 @ 2:15 per Darlina Rumpf   ESOPHAGOGASTRODUODENOSCOPY (EGD) WITH PROPOFOL N/A 08/26/2018   Mild Schatzki ring status post dilation, small hiatal hernia.   EYE SURGERY     Catracts removed both eye 30 yrs ago    HERNIA REPAIR     right and left   HOLMIUM LASER APPLICATION Left 12/30/2014   Procedure: HOLMIUM LASER APPLICATION;  Surgeon: Marcine Matar, MD;  Location: [facility];  Service: Urology;  Laterality: Left;   MALONEY DILATION N/A 09/06/2016   Procedure: Elease Hashimoto DILATION;  Surgeon: Corbin Ade, MD;  Location: [facility];  Service: Endoscopy;  Laterality: N/A;   MALONEY DILATION N/A 08/26/2018   Procedure: Elease Hashimoto DILATION;  Surgeon: Corbin Ade, MD;  Location: [facility];  Service: Endoscopy;  Laterality: N/A;   POLYPECTOMY  08/26/2018   Procedure: POLYPECTOMY;  Surgeon: Corbin Ade, MD;  Location: [facility];  Service: Endoscopy;;  polyp at descending colon x2   TONSILLECTOMY      Current Outpatient Medications  Medication Sig Dispense Refill   amLODipine (NORVASC) 10 MG tablet Take 1 tablet (10 mg total) by mouth daily. 90 tablet 3   cetirizine (ZYRTEC) 10 MG tablet Take 10 mg by mouth daily as needed for allergies.      Cyanocobalamin (VITAMIN B-12 PO) Take 1 tablet by mouth daily.      diphenhydrAMINE HCl (BENADRYL ALLERGY PO) Take 1 tablet by mouth daily.     dutasteride (AVODART) 0.5 MG capsule Take 1 capsule (0.5 mg total) by mouth daily. 90 capsule 3   EPINEPHrine 0.3 mg/0.3 mL IJ SOAJ injection Inject 0.3 mLs (0.3 mg total) into the muscle as needed for anaphylaxis. 1 each 1   fluticasone (FLONASE) 50 MCG/ACT nasal spray INSTILL TWO SPRAYS IN EACH NOSTRIL DAILY AS NEEDED FOR ALLERGIES 48 g 1   ketoconazole (NIZORAL) 2 % shampoo Apply 1 Application topically 2 (two) times a week. 120 mL 0   lansoprazole (PREVACID) 30 MG capsule TAKE 1 CAPSULE BY MOUTH DAILY BEFORE BREAKFAST 90 capsule 3   loperamide (IMODIUM) 2 MG capsule Take by mouth as needed for diarrhea or loose stools. Patient takes every 2-3 days.     memantine (NAMENDA) 10 MG tablet Take 1 tablet (10 mg)  twice a day 60 tablet 11   Simethicone (GAS-X PO) Take 1 tablet by mouth daily as needed.       triamcinolone ointment (KENALOG) 0.5 % Apply 1 Application topically 2 (two) times daily. 45 g 1   vitamin C (ASCORBIC ACID) 500 MG tablet Take 500 mg by mouth daily.     CRANBERRY POWDER PO Take by mouth. 15,000 UNITS as needed (Patient not taking: Reported on 01/16/2023)     desonide (DESOWEN) 0.05 % cream Apply 1 application topically daily as needed (rash).  (Patient not taking: Reported on 01/16/2023)     hydrocortisone cream 1 % Apply 1 application topically daily as needed for itching. (Patient not taking: Reported on 01/16/2023)     Multiple Vitamin (ONE DAILY) tablet Take by mouth. (Patient not taking: Reported on 01/16/2023)     No current facility-administered medications for this visit.    Allergies as of 01/16/2023 -  Review Complete 01/16/2023  Allergen Reaction Noted   Peanut-containing drug products Hives 12/31/2009   Penicillins Hives 03/11/2008   Quinolones  04/11/2018    Family History  Problem Relation Age of Onset   Cancer Mother        lung   Thyroid disease Mother    Diabetes Father    GI Bleed Father    Aneurysm Father    AAA (abdominal aortic aneurysm) Father    AAA (abdominal aortic aneurysm) Brother    Colon cancer Neg Hx    Colon polyps Neg Hx    Rectal cancer Neg Hx    Stomach cancer Neg Hx     Social History   Socioeconomic History   Marital status: Married    Spouse name: Not on file   Number of children: 3   Years of education: 16   Highest education level: Bachelor's degree (e.g., BA, AB, BS)  Occupational History   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  Tobacco Use   Smoking status: Former    Types: Cigarettes    Quit date: 07/24/1964    Years since quitting: 58.5   Smokeless tobacco: Never   Tobacco comments:    Quit 20-30 years ago; smoked pipe and cigars   Vaping Use   Vaping Use: Never used  Substance and Sexual Activity   Alcohol use: Yes    Alcohol/week: 2.0 standard drinks of alcohol    Types: 2 Cans of beer per week     Comment: 2 per day   Drug use: No   Sexual activity: Not on file  Other Topics Concern   Not on file  Social History Narrative   Daily Caffeine use: 5-6 drinks daily      08/07/2018: No drug use, some daily alcohol use at night but patient does not feel like it disrupts his health or is a safety concern.       Lives with wife in two story home; three sons, five grandchildren in [place] area.   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    Right handed            Social Determinants of Health   Financial Resource Strain: Low Risk  (09/22/2022)   Overall Financial Resource Strain (CARDIA)    Difficulty of Paying Living Expenses: Not hard at all  Food Insecurity: No Food Insecurity (09/22/2022)   Hunger Vital Sign    Worried About Running Out of Food in the Last Year: Never true    Ran Out of Food in the Last Year: Never true  Transportation Needs: No Transportation Needs (09/22/2022)   PRAPARE - Administrator, Civil Service (Medical): No    Lack of Transportation (Non-Medical): No  Physical Activity: Insufficiently Active (09/22/2022)   Exercise Vital Sign    Days of Exercise per Week: 4 days    Minutes of Exercise per Session: 20 min  Stress: No Stress Concern Present (09/22/2022)   [name]    Feeling of Stress : Not at all  Social Connections: Socially Integrated (09/22/2022)   Social Connection and Isolation Panel [NHANES]    Frequency of Communication with Friends and Family: More than three times a week    Frequency of Social Gatherings with Friends and Family: More than three times a week    Attends Religious Services: More than 4 times per year  Active Member of Clubs or Organizations: Yes    Attends Banker Meetings: More than 4 times per year    Marital Status: Married  Catering manager Violence: Not At Risk  (09/22/2022)   Humiliation, Afraid, Rape, and Kick questionnaire    Fear of Current or Ex-Partner: No    Emotionally Abused: No    Physically Abused: No    Sexually Abused: No     Review of Systems   Gen: Denies any fever, chills, fatigue, weight loss, lack of appetite.  CV: Denies chest pain, heart palpitations, peripheral edema, syncope.  Resp: Denies shortness of breath at rest or with exertion. Denies wheezing or cough.  GI: Denies dysphagia or odynophagia. Denies jaundice, hematemesis, fecal incontinence. GU : Denies urinary burning, urinary frequency, urinary hesitancy MS: Denies joint pain, muscle weakness, cramps, or limitation of movement.  Derm: Denies rash, itching, dry skin Psych: Denies depression, anxiety, memory loss, and confusion Heme: Denies bruising, bleeding, and enlarged lymph nodes.   Physical Exam   BP 133/72   Pulse 63   Temp 98.6 F (37 C)   Ht 5\' 10"  (1.778 m)   Wt 198 lb 12.8 oz (90.2 kg)   BMI 28.52 kg/m  General:   Alert and oriented. Pleasant and cooperative. Well-nourished and well-developed.  Head:  Normocephalic and atraumatic. Eyes:  Without icterus Abdomen:  +BS, soft, non-tender and non-distended. No HSM noted. No guarding or rebound. No masses appreciated.  Rectal:  Deferred  Msk:  Symmetrical without gross deformities. Normal posture. Extremities:  Without edema. Neurologic:  Alert and  oriented x4;  grossly normal neurologically. Skin:  Intact without significant lesions or rashes. Psych:  Alert and cooperative. Normal mood and affect.   Assessment   Daniel Guerrero is a 77 y.o. male presenting today with a history of chronic GERD, Schatzki ring s/p dilation last in 2020, and tubular adenomas with surveillance due in 2025 if health permits. He was last seen a year ago for routine visit.   Returning today noting decreased appetite, early satiety, and fatigue. Scant blood in stool recently but not persistent. Weight fluctuates overall  after review of trend; however, he has lost 8 lbs since December. Unclear etiology at this time and does not have any associated abdominal pain. Will update labs now. May need imaging, consider colonoscopy/EGD.   Intermittent loose stool along with occasional skipping days in between. No pattern with looser stool. Will start daily fiber to see if this can help "even" things out as may have an overflow presentation.       PLAN    CBC, CMP, celiac serologies Benefiber daily May need imaging, +/- procedures. Will review labs then offer more recommendations.    Gelene Mink, PhD, ANP-BC [facility]

## 2023-01-17 LAB — CBC WITH DIFFERENTIAL/PLATELET
Basophils Absolute: 0.1 10*3/uL (ref 0.0–0.2)
Basos: 1 %
EOS (ABSOLUTE): 0.3 10*3/uL (ref 0.0–0.4)
Eos: 5 %
Hematocrit: 39.6 % (ref 37.5–51.0)
Hemoglobin: 13.6 g/dL (ref 13.0–17.7)
Immature Grans (Abs): 0.1 10*3/uL (ref 0.0–0.1)
Immature Granulocytes: 3 %
Lymphocytes Absolute: 1.4 10*3/uL (ref 0.7–3.1)
Lymphs: 27 %
MCH: 33.1 pg — ABNORMAL HIGH (ref 26.6–33.0)
MCHC: 34.3 g/dL (ref 31.5–35.7)
MCV: 96 fL (ref 79–97)
Monocytes Absolute: 0.7 10*3/uL (ref 0.1–0.9)
Monocytes: 13 %
Neutrophils Absolute: 2.7 10*3/uL (ref 1.4–7.0)
Neutrophils: 51 %
Platelets: 245 10*3/uL (ref 150–450)
RBC: 4.11 x10E6/uL — ABNORMAL LOW (ref 4.14–5.80)
RDW: 12.2 % (ref 11.6–15.4)
WBC: 5.3 10*3/uL (ref 3.4–10.8)

## 2023-01-17 LAB — COMPREHENSIVE METABOLIC PANEL
ALT: 23 IU/L (ref 0–44)
AST: 64 IU/L — ABNORMAL HIGH (ref 0–40)
Albumin: 4 g/dL (ref 3.8–4.8)
Alkaline Phosphatase: 73 IU/L (ref 44–121)
BUN/Creatinine Ratio: 18 (ref 10–24)
BUN: 18 mg/dL (ref 8–27)
Bilirubin Total: 1 mg/dL (ref 0.0–1.2)
CO2: 25 mmol/L (ref 20–29)
Calcium: 9 mg/dL (ref 8.6–10.2)
Chloride: 102 mmol/L (ref 96–106)
Creatinine, Ser: 1.01 mg/dL (ref 0.76–1.27)
Globulin, Total: 2.3 g/dL (ref 1.5–4.5)
Glucose: 103 mg/dL — ABNORMAL HIGH (ref 70–99)
Potassium: 4.1 mmol/L (ref 3.5–5.2)
Sodium: 140 mmol/L (ref 134–144)
Total Protein: 6.3 g/dL (ref 6.0–8.5)
eGFR: 77 mL/min/{1.73_m2} (ref >59)

## 2023-01-17 LAB — IGA: IgA/Immunoglobulin A, Serum: 208 mg/dL (ref 61–437)

## 2023-01-17 LAB — TISSUE TRANSGLUTAMINASE, IGA: Transglutaminase IgA: <2 U/mL (ref 0–3)

## 2023-01-19 ENCOUNTER — Other Ambulatory Visit: Payer: Self-pay | Admitting: Gastroenterology

## 2023-01-19 DIAGNOSIS — K219 Gastro-esophageal reflux disease without esophagitis: Secondary | ICD-10-CM

## 2023-01-30 ENCOUNTER — Other Ambulatory Visit: Payer: Self-pay | Admitting: Thoracic Surgery (Cardiothoracic Vascular Surgery)

## 2023-01-30 DIAGNOSIS — I77810 Thoracic aortic ectasia: Secondary | ICD-10-CM

## 2023-02-06 DIAGNOSIS — H4322 Crystalline deposits in vitreous body, left eye: Secondary | ICD-10-CM | POA: Diagnosis not present

## 2023-02-06 DIAGNOSIS — H43813 Vitreous degeneration, bilateral: Secondary | ICD-10-CM | POA: Diagnosis not present

## 2023-02-06 DIAGNOSIS — Z961 Presence of intraocular lens: Secondary | ICD-10-CM | POA: Diagnosis not present

## 2023-02-21 ENCOUNTER — Encounter: Payer: Self-pay | Admitting: Physician Assistant

## 2023-03-08 ENCOUNTER — Ambulatory Visit: Payer: Medicare Other | Admitting: Physician Assistant

## 2023-03-13 ENCOUNTER — Other Ambulatory Visit: Payer: Self-pay

## 2023-03-13 ENCOUNTER — Emergency Department (HOSPITAL_COMMUNITY)
Admission: EM | Admit: 2023-03-13 | Discharge: 2023-03-13 | Disposition: A | Discharge status: Home / Self Care | Payer: Medicare Other | Attending: Emergency Medicine | Admitting: Emergency Medicine

## 2023-03-13 ENCOUNTER — Ambulatory Visit: Payer: Medicare Other | Admitting: Physician Assistant

## 2023-03-13 ENCOUNTER — Encounter (HOSPITAL_COMMUNITY): Payer: Self-pay | Admitting: Emergency Medicine

## 2023-03-13 DIAGNOSIS — Z91010 Allergy to peanuts: Secondary | ICD-10-CM | POA: Insufficient documentation

## 2023-03-13 DIAGNOSIS — S61213A Laceration without foreign body of left middle finger without damage to nail, initial encounter: Secondary | ICD-10-CM | POA: Diagnosis not present

## 2023-03-13 DIAGNOSIS — Y93G9 Activity, other involving cooking and grilling: Secondary | ICD-10-CM | POA: Insufficient documentation

## 2023-03-13 DIAGNOSIS — W260XXA Contact with knife, initial encounter: Secondary | ICD-10-CM | POA: Diagnosis not present

## 2023-03-13 NOTE — ED Triage Notes (Signed)
Pt cut left middle finger on knife. Bleeding controlled small v shape cut noted. Nad. Pt stated he has had a couple of drinks tonight.

## 2023-03-13 NOTE — ED Provider Notes (Signed)
[facility] EMERGENCY DEPARTMENT AT [facility] Provider Note   CSN: [id number] Arrival date & time: 03/13/23  2030     History  Chief Complaint  Patient presents with   Laceration    Daniel Guerrero is a 77 y.o. male.   Laceration Associated symptoms: no fever        Daniel Guerrero is a 77 y.o. male who presents to the Emergency Department complaining of laceration to the left middle finger.  He states he was cutting vegetables with a kitchen knife when he cut a small wound to the medial aspect of the distal finger.  Bleeding was controlled prior to arrival with direct pressure.  He admits to drinking some alcohol this evening but he denies any blood thinner use.  Denies any numbness or pain to his finger.  Last Td is up-to-date.  Home Medications Prior to Admission medications   Medication Sig Start Date End Date Taking? Authorizing Provider  amLODipine (NORVASC) 10 MG tablet Take 1 tablet (10 mg total) by mouth daily. 12/14/22   Nafziger, Kandee Keen, NP  cetirizine (ZYRTEC) 10 MG tablet Take 10 mg by mouth daily as needed for allergies.     [provider]  Cyanocobalamin (VITAMIN B-12 PO) Take 1 tablet by mouth daily.     [provider]  diphenhydrAMINE HCl (BENADRYL ALLERGY PO) Take 1 tablet by mouth daily.    [provider]  dutasteride (AVODART) 0.5 MG capsule Take 1 capsule (0.5 mg total) by mouth daily. 07/11/22   Nafziger, Kandee Keen, NP  EPINEPHrine 0.3 mg/0.3 mL IJ SOAJ injection Inject 0.3 mLs (0.3 mg total) into the muscle as needed for anaphylaxis. 08/15/19   Nafziger, Kandee Keen, NP  fluticasone (FLONASE) 50 MCG/ACT nasal spray INSTILL TWO SPRAYS IN EACH NOSTRIL DAILY AS NEEDED FOR ALLERGIES 05/29/19   Nafziger, Kandee Keen, NP  ketoconazole (NIZORAL) 2 % shampoo Apply 1 Application topically 2 (two) times a week. 12/14/22   Nafziger, Kandee Keen, NP  lansoprazole (PREVACID) 30 MG capsule TAKE ONE CAPSULE BY MOUTH DAILY BEFORE BREAKFAST. 01/24/23   Gelene Mink,  NP  loperamide (IMODIUM) 2 MG capsule Take by mouth as needed for diarrhea or loose stools. Patient takes every 2-3 days.    [provider]  memantine (NAMENDA) 10 MG tablet Take 1 tablet (10 mg)  twice a day 03/06/22   Marcos Eke, PA-C  Simethicone (GAS-X PO) Take 1 tablet by mouth daily as needed.     [provider]  triamcinolone ointment (KENALOG) 0.5 % Apply 1 Application topically 2 (two) times daily. 12/14/22   Nafziger, Kandee Keen, NP  vitamin C (ASCORBIC ACID) 500 MG tablet Take 500 mg by mouth daily.    [provider]      Allergies    Peanut-containing drug products, Penicillins, and Quinolones    Review of Systems   Review of Systems  Constitutional:  Negative for chills and fever.  Cardiovascular:  Negative for chest pain.  Gastrointestinal:  Negative for nausea and vomiting.  Musculoskeletal:  Negative for arthralgias and neck pain.  Skin:  Positive for wound.  Neurological:  Negative for dizziness, weakness and numbness.    Physical Exam Updated Vital Signs BP 120/67 (BP Location: Right Arm)   Pulse 61   Temp 97.7 F (36.5 C)   Resp 17   SpO2 96%  Physical Exam Vitals and nursing note reviewed.  Constitutional:      General: He is not in acute distress.  Appearance: Normal appearance. He is not ill-appearing or toxic-appearing.  Cardiovascular:     Rate and Rhythm: Normal rate and regular rhythm.     Pulses: Normal pulses.  Pulmonary:     Effort: Pulmonary effort is normal.  Musculoskeletal:        General: Signs of injury present. No swelling, tenderness or deformity. Normal range of motion.     Comments: 0.5 cm flap type laceration to the medial aspect of the distal tip of the left middle finger.  Bleeding controlled prior to arrival.  Full range of motion to the distal finger.  No injury of the nail.  Wound explored, no foreign body seen.  Skin:    General: Skin is warm.     Capillary Refill: Capillary refill takes less than 2  seconds.  Neurological:     General: No focal deficit present.     Mental Status: He is alert.     Sensory: No sensory deficit.     Motor: No weakness.     ED Results / Procedures / Treatments   Labs (all labs ordered are listed, but only abnormal results are displayed) Labs Reviewed - No data to display  EKG None  Radiology No results found.  Procedures Procedures    LACERATION REPAIR Performed by: Tytionna Cloyd Authorized by: Dawayne Ohair Consent: Verbal consent obtained. Risks and benefits: risks, benefits and alternatives were discussed Consent given by: patient Patient identity confirmed: provided demographic data Prepped and Draped in normal sterile fashion Wound explored  Laceration Location: Left middle finger  Laceration Length: 0.5 cm  No Foreign Bodies seen or palpated  Anesthesia: None  Irrigation method: syringe Amount of cleaning: standard  Skin closure: Tissue adhesive, Steri-Strip  Technique:  topical application  Patient tolerance: Patient tolerated the procedure well with no immediate complications.   Medications Ordered in ED Medications - No data to display  ED Course/ Medical Decision Making/ A&P                                 Medical Decision Making Patient here for evaluation of laceration on the left middle finger.  Cut his finger while using a kitchen knife to cut vegetables.  Bleeding controlled prior to arrival using direct pressure.  His tetanus is up-to-date.  He denies any pain or numbness of the finger.  Able to perform full range of motion without difficulty.  No active bleeding on my exam.  There is no foreign bodies  Amount and/or Complexity of Data Reviewed Discussion of management or test interpretation with external provider(s): Patient tolerated procedure well.  Neurovascular intact.  No foreign bodies on exam.  Explored through range of motion and depth of the wound.  Wound well-approximated using tissue  adhesive and Steri-Strip.  Dressing applied.  Td is up-to-date.  Wound care instructions discussed.           Final Clinical Impression(s) / ED Diagnoses Final diagnoses:  Laceration of left middle finger without foreign body without damage to nail, initial encounter    Rx / DC Orders ED Discharge Orders     None         Rosey Bath 03/13/23 2138    Eber Hong, MD 03/13/23 2219

## 2023-03-13 NOTE — Discharge Instructions (Signed)
The laceration of your finger has been cleaned and closed using skin glue.  This will likely begin to peel off and 8 to 10 days.  Allowed to come off naturally.  Avoid heavy gripping or excessive use of your finger.  You may use the finger splint as needed when you are working with your hands and you may remove it at bedtime.  Tylenol if needed for pain.  Follow-up with your primary care provider for recheck or return to the emergency department for any signs of infection

## 2023-03-15 ENCOUNTER — Ambulatory Visit (INDEPENDENT_AMBULATORY_CARE_PROVIDER_SITE_OTHER): Payer: Medicare Other | Admitting: Physician Assistant

## 2023-03-15 ENCOUNTER — Encounter: Payer: Self-pay | Admitting: Physician Assistant

## 2023-03-15 VITALS — BP 131/78 | HR 56 | Resp 18 | Ht 70.0 in | Wt 200.0 lb

## 2023-03-15 DIAGNOSIS — G3184 Mild cognitive impairment, so stated: Secondary | ICD-10-CM | POA: Diagnosis not present

## 2023-03-15 DIAGNOSIS — R413 Other amnesia: Secondary | ICD-10-CM

## 2023-03-15 DIAGNOSIS — F1010 Alcohol abuse, uncomplicated: Secondary | ICD-10-CM

## 2023-03-15 MED ORDER — MEMANTINE HCL 10 MG PO TABS: ORAL_TABLET | ORAL | 3 refills | Status: DC

## 2023-03-15 NOTE — Progress Notes (Signed)
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 Patient is on memantine 10 mg twice daily, tolerating well. Patient continues to drink and alcohol cessation has been  counseled.     Recommendations:   Follow up in  6 months. Recommend good control of cardiovascular risk factors.  Patient has a history of thoracic aneurysm, followed by [facility] Discontinue alcohol intake   monitor driving Check B1 and W09 Continue Memantine 10 mg twice daily. Side effects were discussed  Continue to control mood as per PCP    Subjective:   This patient is accompanied in the office by his wife  who supplements the history. Previous records as well as any outside records available were reviewed prior to todays visit.   Patient was last seen on 09/07/22     Any changes in memory since last visit? " Getting worse, my wife keeps reminding me".  He likes to play computer games, "no more Legos, crossword puzzles, solitaire and word finding".  He likes to go [employer].  "No more hands on things" Long-term memory is good repeats oneself?  Endorsed by patient  Disoriented when walking into a room?  Patient denies   Leaving objects in unusual places?  Patient denies   Wandering behavior?   denies   Any personality changes since last visit?  "When he drinks he does"-his wife says  Any worsening depression?: Denies. Hallucinations or paranoia?  Denies   Seizures?   Denies.    Any sleep changes? Sleeps well .   Denies vivid dreams, REM behavior or sleepwalking    Sleep apnea?   denies    Any hygiene concerns?   denies   Independent of bathing and dressing?  Endorsed  Does the patient needs help with medications? Patient is in charge, sets them weekly  Who is in charge of the finances?  Wife is an Airline pilot, she has always been in charge     Any changes in appetite?  Denies "I have lost my appetite but I am forcing myself to eat"      Patient have trouble swallowing?  denies   Does the patient cook?  Yes, but recently he was chopping and finger sustaining a laceration. "He was drinking that night". Any kitchen accidents such as leaving the stove on?   denies   Any headaches?    Denies    Vision changes? Denies  Chronic pain?  Denies     Ambulates with difficulty?  He likes to walk with his wife, he also uses a stationary bike and treadmill   Recent falls or head injuries?  He had been drinking  and had a mechanical fall without LOC, but he hit his head with the fireplace    Unilateral weakness, numbness or tingling?   denies   Any tremors?  Denies    Any anosmia?    Denies    Any incontinence of urine?  He is seeing a urology for low stream.   Any bowel dysfunction?  Intermittent, chronic diarrhea "depending on the food choices ", due to gallbladder disease He continues to drink 2-3  beer a day  Patient lives with his wife  Does the patient drive?  Endorsed, but depends on his GPS. but he only drinks at night.    Neuropsych evaluation 11/17/2022 briefly, results suggested a primary impairment surrounding all aspects of learning and memory. Additional impairments were exhibited across processing speed, executive functioning (i.e., cognitive flexibility and response inhibition), clock drawing, and semantic fluency. The etiology for ongoing cognitive impairment is unclear. With that being said, I do have elevated concerns surrounding an underlying neurodegenerative illness, namely Alzheimer's disease. Across memory testing, Daniel Guerrero did not benefit  from repeated learning opportunities, was fully amnestic (i.e., 0% retention) across two verbal memory measures after a brief delay and only exhibited 22% retention on a visual task, and performed very poorly across all yes/no recognition testing. Taken together, this suggests rapid forgetting and a pronounced storage impairment, both of which are hallmark characteristics of this illness. His further semantic fluency impairment is also worrisome for typical disease progression. Testing patterns align well with day-to-day reporting of Daniel Guerrero's children noting that he is repetitive in conversation and commonly tells the same stories multiple times. Intact confrontation naming is encouraging and could suggest earlier disease staging if this illness is truly present. Mild microvascular ischemic disease and scattered frontoparietal microhemorrhages seen on his most recent brain MRI (2022) may exacerbate ongoing dysfunction. These findings would not explain amnestic memory performances across testing. Continued medical monitoring will be important moving forward.      Initial visit 03/02/21 the patient is seen in neurologic consultation at the request of Daniel Frees, NP for the evaluation of memory.  The patient is accompanied by wife Daniel Guerrero who supplements the history. He is a very pleasant 77 year old RH man retired professor, who began to experience memory issues about 1 year ago.  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  He states that recalling names and numbers became more difficult, even numbers that he had dialed frequently.  "I have been always bad with dates, so I was not too worried when that all I could not remember "    His mood has been good, without depression or irritability.  He likes to enjoy his retirement, working on his yard, walking with his wife, and using his stationary bicycle.  He still would like to go back to teaching  if he could.  He falls asleep well at night, but because of frequent urination he wakes up about 3 times to go to the bathroom, which interferes with his sleep.  He has begun taking Flomax which seems to help somehow.  He denies any vivid dreams or sleepwalking.  He denies hallucinations, paranoia, or leaving objects in unusual places.   He is independent of bathing and dressing.  Occasionally, he forgets to take a dose of medication.  He tries to set then in front of the breakfast table in a pillbox and his wife monitors closely.  She also manages the finances, she is an Airline pilot, and has been doing so "forever ".  His appetite is good, but when he was having worse diarrhea, had lost a significant amount of weight, using this opportunity to  eat better and stay within the 180 to 190 pound range.  He denies trouble swallowing.  He never cooks, his wife always did the cooking.   He ambulates without difficulty without the use of a walker or a cane.  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  He drives and uses his GPS, without getting lost  -he depends so much of his GPS, but at times he may feel lost without it.  He denies any recent headaches -He has a history of migraines, but they are not life limiting with a frequency of about once a month.  He denies any falls, head injuries, double vision, dizziness, vertigo, focal numbness or tingling, unilateral weakness or tremors, denies anosmia, never had COVID.  He denies a history of sleep apnea, alcohol or tobacco abuse.  Family history negative for dementia.     Labs  02/01/21   TSH nl 1.42 B12  nl 539 UA neg CBC normal      MRI brain 02/16/21  No evidence of acute intracranial abnormality.  Mild chronic small vessel ischemic changes within the cerebral white matter and pons, similar as compared to the brain MRI of 01/25/2018. Redemonstrated  scattered chronic microhemorrhages within the  frontoparietal lobes, nonspecific but likely reflecting sequela of hypertensive microangiopathy.  Mild generalized cerebral and cerebellar atrophy.   Mild bilateral ethmoid and maxillary sinus mucosal thickening     Past Medical History:  Diagnosis Date   Allergic rhinitis 08/01/2010   Amnestic MCI (mild cognitive impairment with memory loss) 11/17/2022   Asthma with acute exacerbation 11/09/2014   Asymmetrical sensorineural hearing loss 02/06/2018   Left Ear   Benign paroxysmal positional vertigo 04/16/2014   BPH (benign prostatic hyperplasia)    Cataract of both eyes    implants both eyes    Contact dermatitis 12/13/2007   Dysphagia 08/04/2010   Eczema    Enlarged aorta (HCC)    Erectile dysfunction    GERD (gastroesophageal reflux disease) 11/26/2018   Headache 12/19/2016   Headache, common migraine, intractable 09/09/2010   History of colonic polyps 11/26/2018   History of inguinal hernia repair, bilateral    Hyperlipidemia 04/14/2016   Hypertension    Kidney stones    Meniere disease    Paroxysmal SVT (supraventricular tachycardia)    Psoriasis 12/13/2007   S/P appendectomy    S/P tonsillectomy    Sinus bradycardia 06/10/2015   Varicose veins of right lower extremity with complications 10/30/2016     Past Surgical History:  Procedure Laterality Date   Ablasion     APPENDECTOMY     CARDIAC ELECTROPHYSIOLOGY MAPPING AND ABLATION  2015   CHOLECYSTECTOMY     COLONOSCOPY     unsure where or when- states greater than 10 yrs ago per pt- was normal per pt and his wife    COLONOSCOPY WITH PROPOFOL N/A 08/26/2018   two tubular adenomas,  diverticulosis.  Repeat colonoscopy in February 2025 if overall health permits.   CYSTOSCOPY WITH RETROGRADE PYELOGRAM, URETEROSCOPY AND STENT PLACEMENT Left 12/30/2014   Procedure: CYSTOSCOPY WITH LEFT URETEROSCOPY STONE EXTRACTION WITH STENT;  Surgeon: Marcine Matar, MD;  Location: [facility];  Service: Urology;  Laterality: Left;   ELECTROPHYSIOLOGIC STUDY N/A 08/24/2015   Procedure: SVT Ablation;  Surgeon: Hillis Range, MD;  Location: [facility];  Service: Cardiovascular;  Laterality: N/A;   ENDOVENOUS ABLATION SAPHENOUS VEIN W/ LASER Right 12/04/2016   endovenous laser ablation right greater saphenous vein by Josephina Gip MD    ESOPHAGOGASTRODUODENOSCOPY N/A 08/18/2016   Procedure: ESOPHAGOGASTRODUODENOSCOPY (EGD);  Surgeon: Corbin Ade, MD;  Location: [facility];  Service: Endoscopy;  Laterality: N/A;   ESOPHAGOGASTRODUODENOSCOPY N/A 09/06/2016   Procedure: ESOPHAGOGASTRODUODENOSCOPY (EGD);  Surgeon: Corbin Ade, MD;  Location: [facility];  Service: Endoscopy;  Laterality: N/A;  100 - moved to 2/14 @ 2:15 per Darlina Rumpf   ESOPHAGOGASTRODUODENOSCOPY (EGD) WITH PROPOFOL N/A 08/26/2018   Mild Schatzki ring status post dilation, small hiatal hernia.   EYE SURGERY     Catracts removed both eye 30 yrs ago   HERNIA REPAIR     right and left   HOLMIUM LASER APPLICATION Left 12/30/2014   Procedure: HOLMIUM LASER APPLICATION;  Surgeon: Marcine Matar, MD;  Location: [facility];  Service: Urology;  Laterality: Left;   MALONEY DILATION N/A 09/06/2016   Procedure: Elease Hashimoto DILATION;  Surgeon: Corbin Ade, MD;  Location: [facility];  Service: Endoscopy;  Laterality: N/A;   MALONEY DILATION N/A 08/26/2018   Procedure: Elease Hashimoto DILATION;  Surgeon: Corbin Ade, MD;  Location: [facility];  Service: Endoscopy;  Laterality: N/A;   POLYPECTOMY  08/26/2018   Procedure: POLYPECTOMY;  Surgeon: Corbin Ade, MD;  Location: [facility];  Service: Endoscopy;;  polyp at descending colon x2   TONSILLECTOMY       PREVIOUS MEDICATIONS:   CURRENT MEDICATIONS:  Outpatient Encounter Medications as of 03/15/2023  Medication Sig   amLODipine (NORVASC) 10 MG tablet Take 1 tablet (10 mg total) by mouth daily.   cetirizine (ZYRTEC) 10 MG tablet Take 10 mg by mouth daily as  needed for allergies.    Cyanocobalamin (VITAMIN B-12 PO) Take 1 tablet by mouth daily.    diphenhydrAMINE HCl (BENADRYL ALLERGY PO) Take 1 tablet by mouth daily.   dutasteride (AVODART) 0.5 MG capsule Take 1 capsule (0.5 mg total) by mouth daily.   EPINEPHrine 0.3 mg/0.3 mL IJ SOAJ injection Inject 0.3 mLs (0.3 mg total) into the muscle as needed for anaphylaxis.   fluticasone (FLONASE) 50 MCG/ACT nasal spray INSTILL TWO SPRAYS IN EACH NOSTRIL DAILY AS NEEDED FOR ALLERGIES   ketoconazole (NIZORAL) 2 % shampoo Apply 1 Application topically 2 (two) times a week.   lansoprazole (PREVACID) 30 MG capsule TAKE ONE CAPSULE BY MOUTH DAILY BEFORE BREAKFAST.   loperamide (IMODIUM) 2 MG capsule Take by mouth as needed for diarrhea or loose stools. Patient takes every 2-3 days.   Simethicone (GAS-X PO) Take 1 tablet by mouth daily as needed.    tamsulosin (FLOMAX) 0.4 MG CAPS capsule Take 0.8 mg by mouth daily.   triamcinolone ointment (KENALOG) 0.5 % Apply 1 Application topically 2 (two) times daily.   vitamin C (ASCORBIC ACID) 500 MG tablet Take 500 mg by mouth daily.   [DISCONTINUED] memantine (NAMENDA) 10 MG tablet Take 1 tablet (10 mg)  twice a day   memantine (NAMENDA) 10 MG tablet Take 1 tablet (10 mg)  twice a day   No facility-administered encounter medications on file as of 03/15/2023.     Objective:     PHYSICAL EXAMINATION:    VITALS:   Vitals:   03/15/23 1237  BP: 131/78  Pulse: (!) 56  Resp: 18  SpO2: 98%  Weight: 200 lb (90.7 kg)  Height: 5\' 10"  (1.778 m)    GEN:  The patient appears stated age and is in NAD. HEENT:  Normocephalic, atraumatic.   Neurological examination:  General: NAD, well-groomed, appears stated age. Orientation: The patient is alert. Oriented to person, place and date Cranial nerves: There is good facial symmetry.The speech is fluent and clear. No aphasia or dysarthria. Fund of  knowledge is appropriate. Recent memory impaired and remote memory is  normal.  Attention and concentration are normal.  Able to name objects and repeat phrases.  Hearing is intact to conversational tone.   Sensation: Sensation is intact to light touch throughout Motor: Strength is at least antigravity x4. DTR's 2/4 in UE/LE      03/02/2021    3:00 PM  Montreal Cognitive Assessment   Visuospatial/ Executive (0/5) 4  Naming (0/3) 2  Attention: Read list of digits (0/2) 2  Attention: Read list of letters (0/1) 1  Attention: Serial 7 subtraction starting at 100 (0/3) 3  Language: Repeat phrase (0/2) 2  Language : Fluency (0/1) 1  Abstraction (0/2) 1  Delayed Recall (0/5) 0  Orientation (0/6) 4  Total 20  Adjusted Score (based on education) 20       09/07/2022   12:00 PM 03/07/2022   12:00 PM 02/01/2021    5:45 PM  MMSE - Mini Mental State Exam  Orientation to time 5 2 5   Orientation to Place 5 5 5   Registration 3 3 3   Attention/ Calculation 4 4 4   Recall 2 2 1   Language- name 2 objects 2 2 2   Language- repeat 1 1 1   Language- follow 3 step command 3 3 3   Language- read & follow direction 1 1 1   Write a sentence 1 0 1  Copy design 1 1 1   Total score 28 24 27        Movement examination: Tone: There is normal tone in the UE/LE Abnormal movements:  no tremor.  No myoclonus.  No asterixis.   Coordination:  There is no decremation with RAM's. Normal finger to nose  Gait and Station: The patient has no difficulty arising out of a deep-seated chair without the use of the hands. The patient's stride length is good.  Gait is cautious and narrow.   Thank you for allowing [place] the opportunity to participate in the care of this nice patient. Please do not hesitate to contact us for any questions or concerns.   Total time spent on today's visit was 28 minutes dedicated to this patient today, preparing to see patient, examining the patient, ordering tests and/or medications and counseling the patient, documenting clinical information in the EHR or other  health record, independently interpreting results and communicating results to the patient/family, discussing treatment and goals, answering patient's questions and coordinating care.  Cc:  Daniel Frees, NP  Marlowe Kays 03/15/2023 1:22 PM

## 2023-03-15 NOTE — Patient Instructions (Signed)
It was a pleasure to see you today at our office.   Recommendations: Continue memantine 10 mg  1 tablet  twice daily.   Check B1 and B12 at the lab  Follow up in 6 months Make sure to discuss with your doctor possible ADD Discontinue Alcohol.    RECOMMENDATIONS FOR ALL PATIENTS WITH MEMORY PROBLEMS: 1. Continue to exercise (Recommend 30 minutes of walking everyday, or 3 hours every week) 2. Increase social interactions - continue going to [place] and enjoy social gatherings with friends and family 3. Eat healthy, avoid fried foods and eat more fruits and vegetables 4. Maintain adequate blood pressure, blood sugar, and blood cholesterol level. Reducing the risk of stroke and cardiovascular disease also helps promoting better memory. 5. Avoid stressful situations. Live a simple life and avoid aggravations. Organize your time and prepare for the next day in anticipation. 6. Sleep well, avoid any interruptions of sleep and avoid any distractions in the bedroom that may interfere with adequate sleep quality 7. Avoid sugar, avoid sweets as there is a strong link between excessive sugar intake, diabetes, and cognitive impairment We discussed the Mediterranean diet, which has been shown to help patients reduce the risk of progressive memory disorders and reduces cardiovascular risk. This includes eating fish, eat fruits and green leafy vegetables, nuts like almonds and hazelnuts, walnuts, and also use olive oil. Avoid fast foods and fried foods as much as possible. Avoid sweets and sugar as sugar use has been linked to worsening of memory function.  There is always a concern of gradual progression of memory problems. If this is the case, then we may need to adjust level of care according to patient needs. Support, both to the patient and caregiver, should then be put into place.     FALL PRECAUTIONS: Be cautious when walking. Scan the area for obstacles that may increase the risk of trips and falls.  When getting up in the mornings, sit up at the edge of the bed for a few minutes before getting out of bed. Consider elevating the bed at the head end to avoid drop of blood pressure when getting up. Walk always in a well-lit room (use night lights in the walls). Avoid area rugs or power cords from appliances in the middle of the walkways. Use a walker or a cane if necessary and consider physical therapy for balance exercise. Get your eyesight checked regularly.  FINANCIAL OVERSIGHT: Supervision, especially oversight when making financial decisions or transactions is also recommended.  HOME SAFETY: Consider the safety of the kitchen when operating appliances like stoves, microwave oven, and blender. Consider having supervision and share cooking responsibilities until no longer able to participate in those. Accidents with firearms and other hazards in the house should be identified and addressed as well.   ABILITY TO BE LEFT ALONE: If patient is unable to contact 911 operator, consider using LifeLine, or when the need is there, arrange for someone to stay with patients. Smoking is a fire hazard, consider supervision or cessation. Risk of wandering should be assessed by caregiver and if detected at any point, supervision and safe proof recommendations should be instituted.  MEDICATION SUPERVISION: Inability to self-administer medication needs to be constantly addressed. Implement a mechanism to ensure safe administration of the medications.   DRIVING: Regarding driving, in patients with progressive memory problems, driving will be impaired. We advise to have someone else do the driving if trouble finding directions or if minor accidents are reported. Independent driving assessment is  available to determine safety of driving.   If you are interested in the driving assessment, you can contact the following:  [facility] in [place] [phone]  [facility] Rehabilitative Services  [phone]  [facility] 121 [phone] or [phone]    Mediterranean Diet A Mediterranean diet refers to food and lifestyle choices that are based on the traditions of countries located on the [employer]. This way of eating has been shown to help prevent certain conditions and improve outcomes for people who have chronic diseases, like kidney disease and heart disease. What are tips for following this plan? Lifestyle  Cook and eat meals together with your family, when possible. Drink enough fluid to keep your urine clear or pale yellow. Be physically active every day. This includes: Aerobic exercise like running or swimming. Leisure activities like gardening, walking, or housework. Get 7-8 hours of sleep each night. If recommended by your health care provider, drink red wine in moderation. This means 1 glass a day for nonpregnant women and 2 glasses a day for men. A glass of wine equals 5 oz (150 mL). Reading food labels  Check the serving size of packaged foods. For foods such as rice and pasta, the serving size refers to the amount of cooked product, not dry. Check the total fat in packaged foods. Avoid foods that have saturated fat or trans fats. Check the ingredients list for added sugars, such as corn syrup. Shopping  At the grocery store, buy most of your food from the areas near the walls of the store. This includes: Fresh fruits and vegetables (produce). Grains, beans, nuts, and seeds. Some of these may be available in unpackaged forms or large amounts (in bulk). Fresh seafood. Poultry and eggs. Low-fat dairy products. Buy whole ingredients instead of prepackaged foods. Buy fresh fruits and vegetables in-season from local farmers markets. Buy frozen fruits and vegetables in resealable bags. If you do not have access to quality fresh seafood, buy precooked frozen shrimp or canned fish, such as tuna, salmon, or sardines. Buy  small amounts of raw or cooked vegetables, salads, or olives from the deli or salad bar at your store. Stock your pantry so you always have certain foods on hand, such as olive oil, canned tuna, canned tomatoes, rice, pasta, and beans. Cooking  Cook foods with extra-virgin olive oil instead of using butter or other vegetable oils. Have meat as a side dish, and have vegetables or grains as your main dish. This means having meat in small portions or adding small amounts of meat to foods like pasta or stew. Use beans or vegetables instead of meat in common dishes like chili or lasagna. Experiment with different cooking methods. Try roasting or broiling vegetables instead of steaming or sauteing them. Add frozen vegetables to soups, stews, pasta, or rice. Add nuts or seeds for added healthy fat at each meal. You can add these to yogurt, salads, or vegetable dishes. Marinate fish or vegetables using olive oil, lemon juice, garlic, and fresh herbs. Meal planning  Plan to eat 1 vegetarian meal one day each week. Try to work up to 2 vegetarian meals, if possible. Eat seafood 2 or more times a week. Have healthy snacks readily available, such as: Vegetable sticks with hummus. Greek yogurt. Fruit and nut trail mix. Eat balanced meals throughout the week. This includes: Fruit: 2-3 servings a day Vegetables: 4-5 servings a day Low-fat dairy: 2 servings a day Fish, poultry, or lean meat: 1 serving  a day Beans and legumes: 2 or more servings a week Nuts and seeds: 1-2 servings a day Whole grains: 6-8 servings a day Extra-virgin olive oil: 3-4 servings a day Limit red meat and sweets to only a few servings a month What are my food choices? Mediterranean diet Recommended Grains: Whole-grain pasta. Brown rice. Bulgar wheat. Polenta. Couscous. Whole-wheat bread. Orpah Cobb. Vegetables: Artichokes. Beets. Broccoli. Cabbage. Carrots. Eggplant. Green beans. Chard. Kale. Spinach. Onions. Leeks. Peas.  Squash. Tomatoes. Peppers. Radishes. Fruits: Apples. Apricots. Avocado. Berries. Bananas. Cherries. Dates. Figs. Grapes. Lemons. Melon. Oranges. Peaches. Plums. Pomegranate. Meats and other protein foods: Beans. Almonds. Sunflower seeds. Pine nuts. Peanuts. Cod. Salmon. Scallops. Shrimp. Tuna. Tilapia. Clams. Oysters. Eggs. Dairy: Low-fat milk. Cheese. Greek yogurt. Beverages: Water. Red wine. Herbal tea. Fats and oils: Extra virgin olive oil. Avocado oil. Grape seed oil. Sweets and desserts: [place] yogurt with honey. Baked apples. Poached pears. Trail mix. Seasoning and other foods: Basil. Cilantro. Coriander. Cumin. Mint. Parsley. Sage. Rosemary. Tarragon. Garlic. Oregano. Thyme. Pepper. Balsalmic vinegar. Tahini. Hummus. Tomato sauce. Olives. Mushrooms. Limit these Grains: Prepackaged pasta or rice dishes. Prepackaged cereal with added sugar. Vegetables: Deep fried potatoes (french fries). Fruits: Fruit canned in syrup. Meats and other protein foods: Beef. Pork. Lamb. Poultry with skin. Hot dogs. Tomasa Blase. Dairy: Ice cream. Sour cream. Whole milk. Beverages: Juice. Sugar-sweetened soft drinks. Beer. Liquor and spirits. Fats and oils: Butter. Canola oil. Vegetable oil. Beef fat (tallow). Lard. Sweets and desserts: Cookies. Cakes. Pies. Candy. Seasoning and other foods: Mayonnaise. Premade sauces and marinades. The items listed may not be a complete list. Talk with your dietitian about what dietary choices are right for you. Summary The Mediterranean diet includes both food and lifestyle choices. Eat a variety of fresh fruits and vegetables, beans, nuts, seeds, and whole grains. Limit the amount of red meat and sweets that you eat. Talk with your health care provider about whether it is safe for you to drink red wine in moderation. This means 1 glass a day for nonpregnant women and 2 glasses a day for men. A glass of wine equals 5 oz (150 mL). This information is not intended to replace advice  given to you by your health care provider. Make sure you discuss any questions you have with your health care provider. Document Released: 03/02/2016 Document Revised: 04/04/2016 Document Reviewed: 03/02/2016 Elsevier Interactive Patient Education  2017 [employer].

## 2023-03-16 ENCOUNTER — Telehealth: Payer: Self-pay

## 2023-03-16 NOTE — Transitions of Care (Post Inpatient/ED Visit) (Signed)
03/16/2023  Name: Daniel Guerrero MRN: [id number] DOB: [birth year]  Today's TOC FU Call Status: Today's TOC FU Call Status:: Successful TOC FU Call Completed TOC FU Call Complete Date: 03/16/23 Patient's Name and Date of Birth confirmed.  Red on EMMI-ED Discharge Alert Date & Reason:03/13/23 "Scheduled follow-up appt? No"  Transition Care Management Follow-up Telephone Call Date of Discharge: 03/13/23 [facility] Type of Discharge: Emergency Department Reason for ED Visit: Other: ("laceration of left middle finger without foreign body without damage to nail") How have you been since you were released from the hospital?: Better (Pt reports he is doing well-states area is healing and "coming along okay." Denies any pain to the area.) Any questions or concerns?: No  Items Reviewed: Did you receive and understand the discharge instructions provided?: Yes Medications obtained,verified, and reconciled?: No Medications Not Reviewed Reasons:: Other: (pt declined-no changes to meds) Any new allergies since your discharge?: No Dietary orders reviewed?: NA Do you have support at home?: Yes People in Home: alone  Medications Reviewed Today: Medications Reviewed Today     Reviewed by Charlyn Minerva, RN (Registered Nurse) on 03/16/23 at 1054  Med List Status: <None>   Medication Order Taking? Sig Documenting Provider Last Dose Status Informant  amLODipine (NORVASC) 10 MG tablet [id number] No Take 1 tablet (10 mg total) by mouth daily. Nafziger, Kandee Keen, NP Taking Active   cetirizine (ZYRTEC) 10 MG tablet [id number] No Take 10 mg by mouth daily as needed for allergies.  [provider] Taking Active Self  Cyanocobalamin (VITAMIN B-12 PO) [id number] No Take 1 tablet by mouth daily.  [provider] Taking Active Self           Med Note Marion Downer   Thu Jun 10, 2015  8:57 AM)    diphenhydrAMINE HCl (BENADRYL ALLERGY PO) [id number] No Take 1  tablet by mouth daily. [provider] Taking Active Self           Med Note Seward Meth, Annell Greening I   Tue Jan 16, 2023  2:50 PM) As needed  dutasteride (AVODART) 0.5 MG capsule [id number] No Take 1 capsule (0.5 mg total) by mouth daily. Nafziger, Kandee Keen, NP Taking Active   EPINEPHrine 0.3 mg/0.3 mL IJ SOAJ injection [id number] No Inject 0.3 mLs (0.3 mg total) into the muscle as needed for anaphylaxis. Shirline Frees, NP Taking Active            Med Note Westley Foots I   Tue Jan 16, 2023  2:48 PM) As needed  fluticasone [facility]) 50 MCG/ACT nasal spray [id number] No INSTILL TWO SPRAYS IN EACH NOSTRIL DAILY AS NEEDED FOR ALLERGIES Nafziger, Kandee Keen, NP Taking Active            Med Note Seward Meth, Annell Greening I   Tue Dec 13, 2021 11:22 AM) As needed  ketoconazole (NIZORAL) 2 % shampoo [id number] No Apply 1 Application topically 2 (two) times a week. Nafziger, Kandee Keen, NP Taking Active   lansoprazole (PREVACID) 30 MG capsule [id number] No TAKE ONE CAPSULE BY MOUTH DAILY BEFORE BREAKFAST. Gelene Mink, NP Taking Active   loperamide (IMODIUM) 2 MG capsule [id number] No Take by mouth as needed for diarrhea or loose stools. Patient takes every 2-3 days. [provider] Taking Active            Med Note Westley Foots I   Tue Dec 13, 2021 11:24 AM) As needed  memantine (NAMENDA) 10 MG tablet [id number]  Take 1 tablet (10 mg)  twice a day Elwyn Reach  Active   Simethicone (GAS-X PO) [id number] No Take 1 tablet by mouth daily as needed.  [provider] Taking Active Self  tamsulosin (FLOMAX) 0.4 MG CAPS capsule [id number]  Take 0.8 mg by mouth daily. [provider]  Active   triamcinolone ointment (KENALOG) 0.5 % [id number] No Apply 1 Application topically 2 (two) times daily. Shirline Frees, NP Taking Active   vitamin C (ASCORBIC ACID) 500 MG tablet [id number] No Take 500 mg by mouth daily. [provider] Taking Active Self           Med Note Marion Downer    Thu Jun 10, 2015  8:57 AM)              Home Care and Equipment/Supplies: Were Home Health Services Ordered?: NA Any new equipment or medical supplies ordered?: NA  Functional Questionnaire: Do you need assistance with bathing/showering or dressing?: No Do you need assistance with meal preparation?: No Do you need assistance with eating?: No Do you have difficulty maintaining continence: No Do you need assistance with getting out of bed/getting out of a chair/moving?: No Do you have difficulty managing or taking your medications?: No  Follow up appointments reviewed: PCP Follow-up appointment confirmed?: No (pt declined as he is doing well and incision healing) MD Provider Line [phone] Given: No Specialist Hospital Follow-up appointment confirmed?: NA Do you need transportation to your follow-up appointment?: No Do you understand care options if your condition(s) worsen?: Yes-patient verbalized understanding   TOC Interventions Today    Flowsheet Row Most Recent Value  TOC Interventions   TOC Interventions Discussed/Reviewed TOC Interventions Discussed, S/S of infection      Interventions Today    Flowsheet Row Most Recent Value  General Interventions   General Interventions Discussed/Reviewed General Interventions Discussed, Doctor Visits  Doctor Visits Discussed/Reviewed Doctor Visits Discussed, PCP  PCP/Specialist Visits Compliance with follow-up visit  Education Interventions   Education Provided Provided Education  Provided Verbal Education On Nutrition, Medication, When to see the doctor  Pharmacy Interventions   Pharmacy Dicussed/Reviewed Pharmacy Topics Discussed  Safety Interventions   Safety Discussed/Reviewed Safety Discussed       Alessandra Grout [facility] Care Management Care Management Community Coordinator Direct Phone: [phone] Toll Free: [phone] Fax: [phone]

## 2023-03-16 NOTE — Progress Notes (Signed)
B12 is nromal, B1 will be pending till next week, thanks

## 2023-03-19 LAB — VITAMIN B12: Vitamin B-12: 766 pg/mL (ref 200–1100)

## 2023-03-19 LAB — VITAMIN B1: Vitamin B1 (Thiamine): 9 nmol/L (ref 8–30)

## 2023-03-23 ENCOUNTER — Ambulatory Visit: Payer: Medicare Other | Admitting: Thoracic Surgery (Cardiothoracic Vascular Surgery)

## 2023-03-23 ENCOUNTER — Ambulatory Visit
Admission: RE | Admit: 2023-03-23 | Discharge: 2023-03-23 | Disposition: A | Discharge status: Home / Self Care | Payer: Medicare Other | Source: Ambulatory Visit | Attending: Thoracic Surgery (Cardiothoracic Vascular Surgery) | Admitting: Thoracic Surgery (Cardiothoracic Vascular Surgery)

## 2023-03-23 DIAGNOSIS — I77810 Thoracic aortic ectasia: Secondary | ICD-10-CM

## 2023-03-23 DIAGNOSIS — I700 Atherosclerosis of aorta: Secondary | ICD-10-CM | POA: Diagnosis not present

## 2023-03-23 MED ORDER — IOPAMIDOL (ISOVUE-300) INJECTION 61%
500.0000 mL | Freq: Once | INTRAVENOUS | Status: AC | PRN
  Administered 2023-03-23: 75 mL via INTRAVENOUS

## 2023-03-23 NOTE — Progress Notes (Deleted)
[address]       [name] [address]             [phone]       Patient: Home Provider: Office Consent for Telemedicine visit obtained.  Today's visit was completed via a real-time telehealth (see specific modality noted below). The patient/authorized person provided oral consent at the time of the visit to engage in a telemedicine encounter with the present provider at [facility]. The patient/authorized person was informed of the potential benefits, limitations, and risks of telemedicine. The patient/authorized person expressed understanding that the laws that protect confidentiality also apply to telemedicine. The patient/authorized person acknowledged understanding that telemedicine does not provide emergency services and that he or she would need to call 911 or proceed to the nearest hospital for help if such a need arose.   Total time spent in the clinical discussion 10 minutes.  Telehealth Modality: Phone visit (audio only)  Recent Radiology Findings:   CT ANGIO CHEST AORTA W/CM & OR WO/CM  Result Date: 03/23/2023 CLINICAL DATA:  Follow-up thoracic aortic aneurysm. EXAM: CT ANGIOGRAPHY CHEST WITH CONTRAST TECHNIQUE: Multidetector CT imaging of the chest was performed using the standard protocol during bolus administration of intravenous contrast. Multiplanar CT image reconstructions and MIPs were obtained to evaluate the vascular anatomy. RADIATION DOSE REDUCTION: This exam was performed according to the departmental dose-optimization program which includes automated exposure control, adjustment of the mA and/or kV according to patient size and/or use of iterative reconstruction technique. CONTRAST:  75mL ISOVUE-300 IOPAMIDOL (ISOVUE-300) INJECTION 61% COMPARISON:  09/21/2022 FINDINGS: Cardiovascular: Aortic atherosclerosis with diffuse descending thoracic aortic tortuosity. -At the level of the sinuses the aorta measures 4.5 cm and is unchanged compared with  previous exam, image 70/7. -At the sinotubular junction the aorta measures 2.9 cm, image 71/7. Also unchanged. -Ascending thoracic aorta measures 3.1 cm, image 64/7. Stable from previous exam. -Transverse aortic arch measures 2.8 cm, image 126/8. This is stable from prior exam. -Proximal descending aorta measures 3 cm, image 58/4. Previously 3 cm. -At the level of the hiatus the aorta measures 2.7 cm, image 86/4. Formally 2.7 cm. Lad and RCA coronary artery calcifications. Mediastinum/Nodes: No enlarged mediastinal, hilar, or axillary lymph nodes. Thyroid gland, trachea, and esophagus demonstrate no significant findings. Lungs/Pleura: No pleural effusion, or consolidation. Chronic scar versus atelectasis involving the right middle lobe. Mild scarring is again seen within both lung bases. No suspicious pulmonary nodule or mass identified. Upper Abdomen: Scattered liver cysts and small (too small to characterize liver hypodensities are similar in size and multiplicity as well as location compared with the previous examination. The largest is in the lateral dome of right lobe measuring 2.2 cm. Musculoskeletal: No acute or suspicious osseous findings. Degenerative disc disease noted within the lower thoracic spine. Review of the MIP images confirms the above findings. IMPRESSION: 1. Similar dilatation of the aortic root measuring up to 4.5 cm in diameter. 2. Aortic tortuosity is similar to previous exam. No aneurysm of the descending thoracic aorta or signs of dissection. 3. Coronary artery calcifications. 4.  Aortic Atherosclerosis (ICD10-I70.0). Electronically Signed   By: Signa Kell M.D.   On: 03/23/2023 11:46    I had a telephone visit with ***  Assessment:  *** with a *** cm ascending aortic aneurysm.  Echocardiogram shows a *** valve without evidence of regurgitation.  We discussed the natural history and and risk factors for growth of ascending aortic aneurysms.  We covered the importance  of smoking  cessation, tight blood pressure control, refraining from lifting heavy objects, and avoiding fluoroquinolones.  The patient is aware of signs and symptoms of aortic dissection and when to present to the emergency department.  We will continue surveillance and a repeat CT was ordered for *** months.  Ivelisse Culverhouse Keane Scrape

## 2023-03-31 DIAGNOSIS — Z23 Encounter for immunization: Secondary | ICD-10-CM | POA: Diagnosis not present

## 2023-04-10 ENCOUNTER — Ambulatory Visit: Payer: Medicare Other

## 2023-04-10 ENCOUNTER — Ambulatory Visit (INDEPENDENT_AMBULATORY_CARE_PROVIDER_SITE_OTHER): Payer: Medicare Other | Admitting: Adult Health

## 2023-04-10 ENCOUNTER — Encounter: Payer: Self-pay | Admitting: Adult Health

## 2023-04-10 VITALS — BP 138/60 | HR 64 | Temp 97.9°F | Ht 70.0 in | Wt 199.0 lb

## 2023-04-10 DIAGNOSIS — R0789 Other chest pain: Secondary | ICD-10-CM

## 2023-04-10 DIAGNOSIS — R079 Chest pain, unspecified: Secondary | ICD-10-CM | POA: Diagnosis not present

## 2023-04-10 DIAGNOSIS — R9389 Abnormal findings on diagnostic imaging of other specified body structures: Secondary | ICD-10-CM | POA: Diagnosis not present

## 2023-04-10 DIAGNOSIS — I771 Stricture of artery: Secondary | ICD-10-CM | POA: Diagnosis not present

## 2023-04-10 NOTE — Progress Notes (Signed)
Subjective:    Patient ID: Daniel Guerrero, male    DOB: 05-03-1946, 77 y.o.   MRN: [id number]  HPI 77 year old male who  has a past medical history of Allergic rhinitis (08/01/2010), Amnestic MCI (mild cognitive impairment with memory loss) (11/17/2022), Asthma with acute exacerbation (11/09/2014), Asymmetrical sensorineural hearing loss (02/06/2018), Benign paroxysmal positional vertigo (04/16/2014), BPH (benign prostatic hyperplasia), Cataract of both eyes, Contact dermatitis (12/13/2007), Dysphagia (08/04/2010), Eczema, Enlarged aorta (HCC), Erectile dysfunction, GERD (gastroesophageal reflux disease) (11/26/2018), Headache (12/19/2016), Headache, common migraine, intractable (09/09/2010), History of colonic polyps (11/26/2018), History of inguinal hernia repair, bilateral, Hyperlipidemia (04/14/2016), Hypertension, Kidney stones, Meniere disease, Paroxysmal SVT (supraventricular tachycardia), Psoriasis (12/13/2007), S/P appendectomy, S/P tonsillectomy, Sinus bradycardia (06/10/2015), and Varicose veins of right lower extremity with complications (10/30/2016).  He presents to the office today for right chest pain. He feel about 3 days ago. He was getting out of bed to go the restroom and got tripped up and fell onto a wooden piano bench. He reports pain is improving but he continues to have pain in his lower right rib when she twists/turns/takes a deep breath/or coughs.   At home he has been using Advil which seems to help   Review of Systems See HPI   Past Medical History:  Diagnosis Date   Allergic rhinitis 08/01/2010   Amnestic MCI (mild cognitive impairment with memory loss) 11/17/2022   Asthma with acute exacerbation 11/09/2014   Asymmetrical sensorineural hearing loss 02/06/2018   Left Ear   Benign paroxysmal positional vertigo 04/16/2014   BPH (benign prostatic hyperplasia)    Cataract of both eyes    implants both eyes    Contact dermatitis 12/13/2007   Dysphagia 08/04/2010    Eczema    Enlarged aorta (HCC)    Erectile dysfunction    GERD (gastroesophageal reflux disease) 11/26/2018   Headache 12/19/2016   Headache, common migraine, intractable 09/09/2010   History of colonic polyps 11/26/2018   History of inguinal hernia repair, bilateral    Hyperlipidemia 04/14/2016   Hypertension    Kidney stones    Meniere disease    Paroxysmal SVT (supraventricular tachycardia)    Psoriasis 12/13/2007   S/P appendectomy    S/P tonsillectomy    Sinus bradycardia 06/10/2015   Varicose veins of right lower extremity with complications 10/30/2016    Social History   Socioeconomic History   Marital status: Married    Spouse name: Not on file   Number of children: 3   Years of education: 16   Highest education level: Bachelor's degree (e.g., BA, AB, BS)  Occupational History   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  Tobacco Use   Smoking status: Former    Current packs/day: 0.00    Types: Cigarettes    Quit date: 07/24/1964    Years since quitting: 58.7   Smokeless tobacco: Never   Tobacco comments:    Quit 20-30 years ago; smoked pipe and cigars   Vaping Use   Vaping status: Never Used  Substance and Sexual Activity   Alcohol use: Yes    Alcohol/week: 2.0 standard drinks of alcohol    Types: 2 Cans of beer per week    Comment: 2 per day   Drug use: No   Sexual activity: Not on file  Other Topics Concern   Not on file  Social History Narrative   Daily Caffeine use: 5-6 drinks daily      08/07/2018: No drug use, some daily alcohol  use at night but patient does not feel like it disrupts his health or is a safety concern.       Lives with wife in two story home; three sons, five grandchildren in [place].   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    Right handed            Social Determinants of Health   Financial Resource Strain: Low Risk  (04/09/2023)   Overall  Financial Resource Strain (CARDIA)    Difficulty of Paying Living Expenses: Not very hard  Food Insecurity: No Food Insecurity (04/09/2023)   Hunger Vital Sign    Worried About Running Out of Food in the Last Year: Never true    Ran Out of Food in the Last Year: Never true  Transportation Needs: No Transportation Needs (04/09/2023)   PRAPARE - Administrator, Civil Service (Medical): No    Lack of Transportation (Non-Medical): No  Physical Activity: Insufficiently Active (04/09/2023)   Exercise Vital Sign    Days of Exercise per Week: 3 days    Minutes of Exercise per Session: 20 min  Stress: No Stress Concern Present (04/09/2023)   [name]    Feeling of Stress : Not at all  Social Connections: Socially Integrated (04/09/2023)   Social Connection and Isolation Panel [NHANES]    Frequency of Communication with Friends and Family: Twice a week    Frequency of Social Gatherings with Friends and Family: Twice a week    Attends Religious Services: More than 4 times per year    Active Member of [employer] or Organizations: Yes    Attends Engineer, structural: More than 4 times per year    Marital Status: Married  Catering manager Violence: Not At Risk (09/22/2022)   Humiliation, Afraid, Rape, and Kick questionnaire    Fear of Current or Ex-Partner: No    Emotionally Abused: No    Physically Abused: No    Sexually Abused: No    Past Surgical History:  Procedure Laterality Date   Ablasion     APPENDECTOMY     CARDIAC ELECTROPHYSIOLOGY MAPPING AND ABLATION  2015   CHOLECYSTECTOMY     COLONOSCOPY     unsure where or when- states greater than 10 yrs ago per pt- was normal per pt and his wife    COLONOSCOPY WITH PROPOFOL N/A 08/26/2018   two tubular adenomas,  diverticulosis.  Repeat colonoscopy in February 2025 if overall health permits.   CYSTOSCOPY WITH RETROGRADE PYELOGRAM, URETEROSCOPY AND STENT PLACEMENT  Left 12/30/2014   Procedure: CYSTOSCOPY WITH LEFT URETEROSCOPY STONE EXTRACTION WITH STENT;  Surgeon: Marcine Matar, MD;  Location: [facility];  Service: Urology;  Laterality: Left;   ELECTROPHYSIOLOGIC STUDY N/A 08/24/2015   Procedure: SVT Ablation;  Surgeon: Hillis Range, MD;  Location: [facility] CV [facility];  Service: Cardiovascular;  Laterality: N/A;   ENDOVENOUS ABLATION SAPHENOUS VEIN W/ LASER Right 12/04/2016   endovenous laser ablation right greater saphenous vein by Josephina Gip MD    ESOPHAGOGASTRODUODENOSCOPY N/A 08/18/2016   Procedure: ESOPHAGOGASTRODUODENOSCOPY (EGD);  Surgeon: Corbin Ade, MD;  Location: [facility];  Service: Endoscopy;  Laterality: N/A;   ESOPHAGOGASTRODUODENOSCOPY N/A 09/06/2016   Procedure: ESOPHAGOGASTRODUODENOSCOPY (EGD);  Surgeon: Corbin Ade, MD;  Location: [facility];  Service: Endoscopy;  Laterality: N/A;  100 - moved to 2/14 @ 2:15 per Darlina Rumpf   ESOPHAGOGASTRODUODENOSCOPY (  EGD) WITH PROPOFOL N/A 08/26/2018   Mild Schatzki ring status post dilation, small hiatal hernia.   EYE SURGERY     Catracts removed both eye 30 yrs ago   HERNIA REPAIR     right and left   HOLMIUM LASER APPLICATION Left 12/30/2014   Procedure: HOLMIUM LASER APPLICATION;  Surgeon: Marcine Matar, MD;  Location: [facility];  Service: Urology;  Laterality: Left;   MALONEY DILATION N/A 09/06/2016   Procedure: Elease Hashimoto DILATION;  Surgeon: Corbin Ade, MD;  Location: [facility];  Service: Endoscopy;  Laterality: N/A;   MALONEY DILATION N/A 08/26/2018   Procedure: Elease Hashimoto DILATION;  Surgeon: Corbin Ade, MD;  Location: [facility];  Service: Endoscopy;  Laterality: N/A;   POLYPECTOMY  08/26/2018   Procedure: POLYPECTOMY;  Surgeon: Corbin Ade, MD;  Location: [facility];  Service: Endoscopy;;  polyp at descending colon x2   TONSILLECTOMY      Family History  Problem Relation Age of Onset   Cancer Mother        lung   Thyroid disease Mother    Diabetes  Father    GI Bleed Father    Aneurysm Father    AAA (abdominal aortic aneurysm) Father    AAA (abdominal aortic aneurysm) Brother    Colon cancer Neg Hx    Colon polyps Neg Hx    Rectal cancer Neg Hx    Stomach cancer Neg Hx     Allergies  Allergen Reactions   Peanut-Containing Drug Products Hives   Penicillins Hives    Has patient had a PCN reaction causing immediate rash, facial/tongue/throat swelling, SOB or lightheadedness with hypotension: No Has patient had a PCN reaction causing severe rash involving mucus membranes or skin necrosis: No Has patient had a PCN reaction that required hospitalization No Has patient had a PCN reaction occurring within the last 10 years: No If all of the above answers are "NO", then may proceed with Cephalosporin use.   Quinolones     Patient was warned about not using Cipro and similar antibiotics. Recent studies have raised concern that fluoroquinolone antibiotics could be associated with an increased risk of aortic aneurysm Fluoroquinolones have non-antimicrobial properties that might jeopardise the integrity of the extracellular matrix of the vascular wall In a  propensity score matched cohort study in [place], there was a 66% increased rate of aortic aneurysm or dissection associated with oral fluoroquinolone use, compared wit    Current Outpatient Medications on File Prior to Visit  Medication Sig Dispense Refill   amLODipine (NORVASC) 10 MG tablet Take 1 tablet (10 mg total) by mouth daily. 90 tablet 3   cetirizine (ZYRTEC) 10 MG tablet Take 10 mg by mouth daily as needed for allergies.      Cyanocobalamin (VITAMIN B-12 PO) Take 1 tablet by mouth daily.      diphenhydrAMINE HCl (BENADRYL ALLERGY PO) Take 1 tablet by mouth daily.     dutasteride (AVODART) 0.5 MG capsule Take 1 capsule (0.5 mg total) by mouth daily. 90 capsule 3   EPINEPHrine 0.3 mg/0.3 mL IJ SOAJ injection Inject 0.3 mLs (0.3 mg total) into the muscle as needed for  anaphylaxis. 1 each 1   fluticasone (FLONASE) 50 MCG/ACT nasal spray INSTILL TWO SPRAYS IN EACH NOSTRIL DAILY AS NEEDED FOR ALLERGIES 48 g 1   ketoconazole (NIZORAL) 2 % shampoo Apply 1 Application topically 2 (two) times a week. 120 mL 0   lansoprazole (PREVACID) 30 MG capsule TAKE ONE  CAPSULE BY MOUTH DAILY BEFORE BREAKFAST. 90 capsule 3   loperamide (IMODIUM) 2 MG capsule Take by mouth as needed for diarrhea or loose stools. Patient takes every 2-3 days.     memantine (NAMENDA) 10 MG tablet Take 1 tablet (10 mg)  twice a day 180 tablet 3   Simethicone (GAS-X PO) Take 1 tablet by mouth daily as needed.      tamsulosin (FLOMAX) 0.4 MG CAPS capsule Take 0.8 mg by mouth daily.     triamcinolone ointment (KENALOG) 0.5 % Apply 1 Application topically 2 (two) times daily. 45 g 1   vitamin C (ASCORBIC ACID) 500 MG tablet Take 500 mg by mouth daily.     No current facility-administered medications on file prior to visit.    BP 138/60   Pulse 64   Temp 97.9 F (36.6 C) (Oral)   Ht 5\' 10"  (1.778 m)   Wt 199 lb (90.3 kg)   SpO2 96%   BMI 28.55 kg/m       Objective:   Physical Exam Vitals and nursing note reviewed.  Constitutional:      Appearance: Normal appearance.  Cardiovascular:     Rate and Rhythm: Normal rate and regular rhythm.     Pulses: Normal pulses.     Heart sounds: Normal heart sounds.  Pulmonary:     Effort: Pulmonary effort is normal.     Breath sounds: Normal breath sounds.  Chest:     Chest wall: Tenderness present.     Comments: Lower right sided chest wall pain. He does have bruising noted to chest wall that appears in various stages of healing.  Musculoskeletal:        General: Normal range of motion.  Skin:    General: Skin is warm and dry.  Neurological:     General: No focal deficit present.     Mental Status: He is alert and oriented to person, place, and time.  Psychiatric:        Mood and Affect: Mood normal.        Behavior: Behavior normal.         Thought Content: Thought content normal.        Judgment: Judgment normal.        Assessment & Plan:  1. Chest wall pain - It is improving. He can continue with Advil as needed - Add heating pad  - DG Chest 2 View; Future  Shirline Frees, NP

## 2023-04-10 NOTE — Patient Instructions (Signed)
  I am going to get an xray of the ribs today     [facility]  Address: [address], [place], [place] [address] Phone: [phone]

## 2023-04-17 ENCOUNTER — Encounter: Payer: Self-pay | Admitting: Adult Health

## 2023-04-17 ENCOUNTER — Ambulatory Visit (INDEPENDENT_AMBULATORY_CARE_PROVIDER_SITE_OTHER): Payer: Medicare Other | Admitting: Adult Health

## 2023-04-17 VITALS — BP 120/70 | HR 62 | Temp 97.9°F | Ht 70.0 in | Wt 199.0 lb

## 2023-04-17 DIAGNOSIS — R600 Localized edema: Secondary | ICD-10-CM | POA: Diagnosis not present

## 2023-04-17 DIAGNOSIS — M545 Low back pain, unspecified: Secondary | ICD-10-CM | POA: Diagnosis not present

## 2023-04-17 DIAGNOSIS — R0789 Other chest pain: Secondary | ICD-10-CM | POA: Diagnosis not present

## 2023-04-17 NOTE — Progress Notes (Signed)
Subjective:    Patient ID: Daniel Guerrero, male    DOB: Jun 21, 1946, 77 y.o.   MRN: [id number]  HPI 77 year old male who  has a past medical history of Allergic rhinitis (08/01/2010), Amnestic MCI (mild cognitive impairment with memory loss) (11/17/2022), Asthma with acute exacerbation (11/09/2014), Asymmetrical sensorineural hearing loss (02/06/2018), Benign paroxysmal positional vertigo (04/16/2014), BPH (benign prostatic hyperplasia), Cataract of both eyes, Contact dermatitis (12/13/2007), Dysphagia (08/04/2010), Eczema, Enlarged aorta (HCC), Erectile dysfunction, GERD (gastroesophageal reflux disease) (11/26/2018), Headache (12/19/2016), Headache, common migraine, intractable (09/09/2010), History of colonic polyps (11/26/2018), History of inguinal hernia repair, bilateral, Hyperlipidemia (04/14/2016), Hypertension, Kidney stones, Meniere disease, Paroxysmal SVT (supraventricular tachycardia), Psoriasis (12/13/2007), S/P appendectomy, S/P tonsillectomy, Sinus bradycardia (06/10/2015), and Varicose veins of right lower extremity with complications (10/30/2016).  He presents to the office today for follow up.  He was seen about a week and a half ago for right chest pain after a fall 3 days prior.  He was getting out of bed to go to the restroom and got tripped up and fell onto a wooden piano  bench.  He did have a chest x-ray in the office this has not resulted yet.  Does report that the pain in his chest is improving as well as in his lower back.  Distantly, he reports that his wife and kids noticed that he had some swelling in his lower extremities over the last 4 days.  He denies any chest pain or shortness of breath.  He has been sitting not walking as much as he has in the past due to the low back pain.  He also add salt to his foods.  Swelling today seems to be improved than it was a couple of days ago.   Review of Systems See HPI   Past Medical History:  Diagnosis Date   Allergic rhinitis  08/01/2010   Amnestic MCI (mild cognitive impairment with memory loss) 11/17/2022   Asthma with acute exacerbation 11/09/2014   Asymmetrical sensorineural hearing loss 02/06/2018   Left Ear   Benign paroxysmal positional vertigo 04/16/2014   BPH (benign prostatic hyperplasia)    Cataract of both eyes    implants both eyes    Contact dermatitis 12/13/2007   Dysphagia 08/04/2010   Eczema    Enlarged aorta (HCC)    Erectile dysfunction    GERD (gastroesophageal reflux disease) 11/26/2018   Headache 12/19/2016   Headache, common migraine, intractable 09/09/2010   History of colonic polyps 11/26/2018   History of inguinal hernia repair, bilateral    Hyperlipidemia 04/14/2016   Hypertension    Kidney stones    Meniere disease    Paroxysmal SVT (supraventricular tachycardia)    Psoriasis 12/13/2007   S/P appendectomy    S/P tonsillectomy    Sinus bradycardia 06/10/2015   Varicose veins of right lower extremity with complications 10/30/2016    Social History   Socioeconomic History   Marital status: Married    Spouse name: Not on file   Number of children: 3   Years of education: 16   Highest education level: Bachelor's degree (e.g., BA, AB, BS)  Occupational History   Occupation: Retired    Comment: CC instructor  Tobacco Use   Smoking status: Former    Current packs/day: 0.00    Types: Cigarettes    Quit date: 07/24/1964    Years since quitting: 58.7   Smokeless tobacco: Never   Tobacco comments:    Quit 20-30 years ago; smoked pipe and  cigars   Vaping Use   Vaping status: Never Used  Substance and Sexual Activity   Alcohol use: Yes    Alcohol/week: 2.0 standard drinks of alcohol    Types: 2 Cans of beer per week    Comment: 2 per day   Drug use: No   Sexual activity: Not on file  Other Topics Concern   Not on file  Social History Narrative   Daily Caffeine use: 5-6 drinks daily      08/07/2018: No drug use, some daily alcohol use at night but patient does not  feel like it disrupts his health or is a safety concern.       Lives with wife in two story home; three sons, five grandchildren in [place] area.   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    Right handed            Social Determinants of Health   Financial Resource Strain: Low Risk  (04/09/2023)   Overall Financial Resource Strain (CARDIA)    Difficulty of Paying Living Expenses: Not very hard  Food Insecurity: No Food Insecurity (04/09/2023)   Hunger Vital Sign    Worried About Running Out of Food in the Last Year: Never true    Ran Out of Food in the Last Year: Never true  Transportation Needs: No Transportation Needs (04/09/2023)   PRAPARE - Administrator, Civil Service (Medical): No    Lack of Transportation (Non-Medical): No  Physical Activity: Insufficiently Active (04/09/2023)   Exercise Vital Sign    Days of Exercise per Week: 3 days    Minutes of Exercise per Session: 20 min  Stress: No Stress Concern Present (04/09/2023)   [name]    Feeling of Stress : Not at all  Social Connections: Socially Integrated (04/09/2023)   Social Connection and Isolation Panel [NHANES]    Frequency of Communication with Friends and Family: Twice a week    Frequency of Social Gatherings with Friends and Family: Twice a week    Attends Religious Services: More than 4 times per year    Active Member of [employer] or Organizations: Yes    Attends Engineer, structural: More than 4 times per year    Marital Status: Married  Catering manager Violence: Not At Risk (09/22/2022)   Humiliation, Afraid, Rape, and Kick questionnaire    Fear of Current or Ex-Partner: No    Emotionally Abused: No    Physically Abused: No    Sexually Abused: No    Past Surgical History:  Procedure Laterality Date   Ablasion     APPENDECTOMY     CARDIAC  ELECTROPHYSIOLOGY MAPPING AND ABLATION  2015   CHOLECYSTECTOMY     COLONOSCOPY     unsure where or when- states greater than 10 yrs ago per pt- was normal per pt and his wife    COLONOSCOPY WITH PROPOFOL N/A 08/26/2018   two tubular adenomas,  diverticulosis.  Repeat colonoscopy in February 2025 if overall health permits.   CYSTOSCOPY WITH RETROGRADE PYELOGRAM, URETEROSCOPY AND STENT PLACEMENT Left 12/30/2014   Procedure: CYSTOSCOPY WITH LEFT URETEROSCOPY STONE EXTRACTION WITH STENT;  Surgeon: Marcine Matar, MD;  Location: [facility];  Service: Urology;  Laterality: Left;   ELECTROPHYSIOLOGIC STUDY N/A 08/24/2015   Procedure: SVT Ablation;  Surgeon: Hillis Range, MD;  Location: [facility];  Service: Cardiovascular;  Laterality:  N/A;   ENDOVENOUS ABLATION SAPHENOUS VEIN W/ LASER Right 12/04/2016   endovenous laser ablation right greater saphenous vein by Josephina Gip MD    ESOPHAGOGASTRODUODENOSCOPY N/A 08/18/2016   Procedure: ESOPHAGOGASTRODUODENOSCOPY (EGD);  Surgeon: Corbin Ade, MD;  Location: [facility];  Service: Endoscopy;  Laterality: N/A;   ESOPHAGOGASTRODUODENOSCOPY N/A 09/06/2016   Procedure: ESOPHAGOGASTRODUODENOSCOPY (EGD);  Surgeon: Corbin Ade, MD;  Location: [facility];  Service: Endoscopy;  Laterality: N/A;  100 - moved to 2/14 @ 2:15 per Darlina Rumpf   ESOPHAGOGASTRODUODENOSCOPY (EGD) WITH PROPOFOL N/A 08/26/2018   Mild Schatzki ring status post dilation, small hiatal hernia.   EYE SURGERY     Catracts removed both eye 30 yrs ago   HERNIA REPAIR     right and left   HOLMIUM LASER APPLICATION Left 12/30/2014   Procedure: HOLMIUM LASER APPLICATION;  Surgeon: Marcine Matar, MD;  Location: [facility];  Service: Urology;  Laterality: Left;   MALONEY DILATION N/A 09/06/2016   Procedure: Elease Hashimoto DILATION;  Surgeon: Corbin Ade, MD;  Location: [facility];  Service: Endoscopy;  Laterality: N/A;   MALONEY DILATION N/A 08/26/2018   Procedure: Elease Hashimoto DILATION;   Surgeon: Corbin Ade, MD;  Location: [facility];  Service: Endoscopy;  Laterality: N/A;   POLYPECTOMY  08/26/2018   Procedure: POLYPECTOMY;  Surgeon: Corbin Ade, MD;  Location: [facility];  Service: Endoscopy;;  polyp at descending colon x2   TONSILLECTOMY      Family History  Problem Relation Age of Onset   Cancer Mother        lung   Thyroid disease Mother    Diabetes Father    GI Bleed Father    Aneurysm Father    AAA (abdominal aortic aneurysm) Father    AAA (abdominal aortic aneurysm) Brother    Colon cancer Neg Hx    Colon polyps Neg Hx    Rectal cancer Neg Hx    Stomach cancer Neg Hx     Allergies  Allergen Reactions   Peanut-Containing Drug Products Hives   Penicillins Hives    Has patient had a PCN reaction causing immediate rash, facial/tongue/throat swelling, SOB or lightheadedness with hypotension: No Has patient had a PCN reaction causing severe rash involving mucus membranes or skin necrosis: No Has patient had a PCN reaction that required hospitalization No Has patient had a PCN reaction occurring within the last 10 years: No If all of the above answers are "NO", then may proceed with Cephalosporin use.   Quinolones     Patient was warned about not using Cipro and similar antibiotics. Recent studies have raised concern that fluoroquinolone antibiotics could be associated with an increased risk of aortic aneurysm Fluoroquinolones have non-antimicrobial properties that might jeopardise the integrity of the extracellular matrix of the vascular wall In a  propensity score matched cohort study in [place], there was a 66% increased rate of aortic aneurysm or dissection associated with oral fluoroquinolone use, compared wit    Current Outpatient Medications on File Prior to Visit  Medication Sig Dispense Refill   amLODipine (NORVASC) 10 MG tablet Take 1 tablet (10 mg total) by mouth daily. 90 tablet 3   cetirizine (ZYRTEC) 10 MG tablet Take 10 mg by  mouth daily as needed for allergies.      Cyanocobalamin (VITAMIN B-12 PO) Take 1 tablet by mouth daily.      diphenhydrAMINE HCl (BENADRYL ALLERGY PO) Take 1 tablet by mouth daily.  dutasteride (AVODART) 0.5 MG capsule Take 1 capsule (0.5 mg total) by mouth daily. 90 capsule 3   EPINEPHrine 0.3 mg/0.3 mL IJ SOAJ injection Inject 0.3 mLs (0.3 mg total) into the muscle as needed for anaphylaxis. 1 each 1   fluticasone (FLONASE) 50 MCG/ACT nasal spray INSTILL TWO SPRAYS IN EACH NOSTRIL DAILY AS NEEDED FOR ALLERGIES 48 g 1   ketoconazole (NIZORAL) 2 % shampoo Apply 1 Application topically 2 (two) times a week. 120 mL 0   lansoprazole (PREVACID) 30 MG capsule TAKE ONE CAPSULE BY MOUTH DAILY BEFORE BREAKFAST. 90 capsule 3   loperamide (IMODIUM) 2 MG capsule Take by mouth as needed for diarrhea or loose stools. Patient takes every 2-3 days.     memantine (NAMENDA) 10 MG tablet Take 1 tablet (10 mg)  twice a day 180 tablet 3   Simethicone (GAS-X PO) Take 1 tablet by mouth daily as needed.      tamsulosin (FLOMAX) 0.4 MG CAPS capsule Take 0.8 mg by mouth daily.     triamcinolone ointment (KENALOG) 0.5 % Apply 1 Application topically 2 (two) times daily. 45 g 1   vitamin C (ASCORBIC ACID) 500 MG tablet Take 500 mg by mouth daily.     No current facility-administered medications on file prior to visit.    BP 120/70   Pulse 62   Temp 97.9 F (36.6 C) (Oral)   Ht 5\' 10"  (1.778 m)   Wt 199 lb (90.3 kg)   SpO2 97%   BMI 28.55 kg/m       Objective:   Physical Exam Vitals and nursing note reviewed.  Constitutional:      Appearance: Normal appearance.  Cardiovascular:     Rate and Rhythm: Normal rate and regular rhythm.     Pulses: Normal pulses.     Heart sounds: Normal heart sounds.  Musculoskeletal:        General: Normal range of motion.     Right lower leg: Edema present.     Left lower leg: Edema present.  Skin:    General: Skin is warm and dry.  Neurological:     General: No  focal deficit present.     Mental Status: He is alert and oriented to person, place, and time.  Psychiatric:        Mood and Affect: Mood normal.        Behavior: Behavior normal.        Thought Content: Thought content normal.        Judgment: Judgment normal.           Assessment & Plan:  1. Chest wall pain -I took a look at his x-ray during this office visit.  Did not see any acute fractures but will wait for radiology to follow-up with their reading.  2. Acute midline low back pain without sciatica -Is improving.  He can use a heating pad and Motrin as needed  3. Lower extremity edema -This trace +1 pitting edema noted around his ankles.  This is likely due to this insufficiency for sitting for extended periods of time vs excesive salt intake  -Encouraged to elevate legs while at rest, drink more fluids, use less sodium.  He can use compression socks if he would like though he did not seem keen on this idea.  Advise follow-up if swelling becomes worse  Shirline Frees, NP

## 2023-05-24 ENCOUNTER — Encounter: Payer: Self-pay | Admitting: Adult Health

## 2023-05-24 ENCOUNTER — Ambulatory Visit (INDEPENDENT_AMBULATORY_CARE_PROVIDER_SITE_OTHER): Payer: Medicare Other | Admitting: Adult Health

## 2023-05-24 VITALS — BP 138/80 | HR 55 | Temp 98.1°F | Ht 70.0 in | Wt 192.0 lb

## 2023-05-24 DIAGNOSIS — L03211 Cellulitis of face: Secondary | ICD-10-CM

## 2023-05-24 MED ORDER — DOXYCYCLINE HYCLATE 100 MG PO CAPS
100.0000 mg | ORAL_CAPSULE | Freq: Two times a day (BID) | ORAL | 0 refills | Status: DC
Start: 2023-05-24 — End: 2023-06-01

## 2023-05-24 NOTE — Progress Notes (Signed)
Subjective:    Patient ID: Daniel Guerrero, male    DOB: 31-Jan-1946, 77 y.o.   MRN: [id number]  HPI 77 year old male who  has a past medical history of Allergic rhinitis (08/01/2010), Amnestic MCI (mild cognitive impairment with memory loss) (11/17/2022), Asthma with acute exacerbation (11/09/2014), Asymmetrical sensorineural hearing loss (02/06/2018), Benign paroxysmal positional vertigo (04/16/2014), BPH (benign prostatic hyperplasia), Cataract of both eyes, Contact dermatitis (12/13/2007), Dysphagia (08/04/2010), Eczema, Enlarged aorta (HCC), Erectile dysfunction, GERD (gastroesophageal reflux disease) (11/26/2018), Headache (12/19/2016), Headache, common migraine, intractable (09/09/2010), History of colonic polyps (11/26/2018), History of inguinal hernia repair, bilateral, Hyperlipidemia (04/14/2016), Hypertension, Kidney stones, Meniere disease, Paroxysmal SVT (supraventricular tachycardia) (HCC), Psoriasis (12/13/2007), S/P appendectomy, S/P tonsillectomy, Sinus bradycardia (06/10/2015), and Varicose veins of right lower extremity with complications (10/30/2016).  He presents to the office today for a rash that he has had for an unknown amount of time.  Reports a red irritated rash on the back of both ears.  Over the last few days he has been applying Kenalog cream and using ketoconazole shampoo.  He does not feel as though this is helping and the rash is spreading.  Rash does not itch much but feels more irritated with slight discomfort.   Review of Systems See HPI   Past Medical History:  Diagnosis Date   Allergic rhinitis 08/01/2010   Amnestic MCI (mild cognitive impairment with memory loss) 11/17/2022   Asthma with acute exacerbation 11/09/2014   Asymmetrical sensorineural hearing loss 02/06/2018   Left Ear   Benign paroxysmal positional vertigo 04/16/2014   BPH (benign prostatic hyperplasia)    Cataract of both eyes    implants both eyes    Contact dermatitis 12/13/2007    Dysphagia 08/04/2010   Eczema    Enlarged aorta (HCC)    Erectile dysfunction    GERD (gastroesophageal reflux disease) 11/26/2018   Headache 12/19/2016   Headache, common migraine, intractable 09/09/2010   History of colonic polyps 11/26/2018   History of inguinal hernia repair, bilateral    Hyperlipidemia 04/14/2016   Hypertension    Kidney stones    Meniere disease    Paroxysmal SVT (supraventricular tachycardia) (HCC)    Psoriasis 12/13/2007   S/P appendectomy    S/P tonsillectomy    Sinus bradycardia 06/10/2015   Varicose veins of right lower extremity with complications 10/30/2016    Social History   Socioeconomic History   Marital status: Married    Spouse name: Not on file   Number of children: 3   Years of education: 16   Highest education level: Bachelor's degree (e.g., BA, AB, BS)  Occupational History   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  Tobacco Use   Smoking status: Former    Current packs/day: 0.00    Types: Cigarettes    Quit date: 07/24/1964    Years since quitting: 58.8   Smokeless tobacco: Never   Tobacco comments:    Quit 20-30 years ago; smoked pipe and cigars   Vaping Use   Vaping status: Never Used  Substance and Sexual Activity   Alcohol use: Yes    Alcohol/week: 2.0 standard drinks of alcohol    Types: 2 Cans of beer per week    Comment: 2 per day   Drug use: No   Sexual activity: Not on file  Other Topics Concern   Not on file  Social History Narrative   Daily Caffeine use: 5-6 drinks daily      08/07/2018: No drug  use, some daily alcohol use at night but patient does not feel like it disrupts his health or is a safety concern.       Lives with wife in two story home; three sons, five grandchildren in [place] area.   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    Right handed            Social Determinants of Health   Financial Resource Strain: Low Risk   (04/09/2023)   Overall Financial Resource Strain (CARDIA)    Difficulty of Paying Living Expenses: Not very hard  Food Insecurity: No Food Insecurity (04/09/2023)   Hunger Vital Sign    Worried About Running Out of Food in the Last Year: Never true    Ran Out of Food in the Last Year: Never true  Transportation Needs: No Transportation Needs (04/09/2023)   PRAPARE - Administrator, Civil Service (Medical): No    Lack of Transportation (Non-Medical): No  Physical Activity: Insufficiently Active (04/09/2023)   Exercise Vital Sign    Days of Exercise per Week: 3 days    Minutes of Exercise per Session: 20 min  Stress: No Stress Concern Present (04/09/2023)   [name]    Feeling of Stress : Not at all  Social Connections: Socially Integrated (04/09/2023)   Social Connection and Isolation Panel [NHANES]    Frequency of Communication with Friends and Family: Twice a week    Frequency of Social Gatherings with Friends and Family: Twice a week    Attends Religious Services: More than 4 times per year    Active Member of [employer] or Organizations: Yes    Attends Engineer, structural: More than 4 times per year    Marital Status: Married  Catering manager Violence: Not At Risk (09/22/2022)   Humiliation, Afraid, Rape, and Kick questionnaire    Fear of Current or Ex-Partner: No    Emotionally Abused: No    Physically Abused: No    Sexually Abused: No    Past Surgical History:  Procedure Laterality Date   Ablasion     APPENDECTOMY     CARDIAC ELECTROPHYSIOLOGY MAPPING AND ABLATION  2015   CHOLECYSTECTOMY     COLONOSCOPY     unsure where or when- states greater than 10 yrs ago per pt- was normal per pt and his wife    COLONOSCOPY WITH PROPOFOL N/A 08/26/2018   two tubular adenomas,  diverticulosis.  Repeat colonoscopy in February 2025 if overall health permits.   CYSTOSCOPY WITH RETROGRADE PYELOGRAM,  URETEROSCOPY AND STENT PLACEMENT Left 12/30/2014   Procedure: CYSTOSCOPY WITH LEFT URETEROSCOPY STONE EXTRACTION WITH STENT;  Surgeon: Marcine Matar, MD;  Location: [facility];  Service: Urology;  Laterality: Left;   ELECTROPHYSIOLOGIC STUDY N/A 08/24/2015   Procedure: SVT Ablation;  Surgeon: Hillis Range, MD;  Location: [facility] INVASIVE CV LAB;  Service: Cardiovascular;  Laterality: N/A;   ENDOVENOUS ABLATION SAPHENOUS VEIN W/ LASER Right 12/04/2016   endovenous laser ablation right greater saphenous vein by Josephina Gip MD    ESOPHAGOGASTRODUODENOSCOPY N/A 08/18/2016   Procedure: ESOPHAGOGASTRODUODENOSCOPY (EGD);  Surgeon: Corbin Ade, MD;  [facility];  Service: Endoscopy;  Laterality: N/A;   ESOPHAGOGASTRODUODENOSCOPY N/A 09/06/2016   Procedure: ESOPHAGOGASTRODUODENOSCOPY (EGD);  Surgeon: Corbin Ade, MD;  [facility];  Service: Endoscopy;  Laterality: N/A;  100 - moved to 2/14 @ 2:15 per  Martina   ESOPHAGOGASTRODUODENOSCOPY (EGD) WITH PROPOFOL N/A 08/26/2018   Mild Schatzki ring status post dilation, small hiatal hernia.   EYE SURGERY     Catracts removed both eye 30 yrs ago   HERNIA REPAIR     right and left   HOLMIUM LASER APPLICATION Left 12/30/2014   Procedure: HOLMIUM LASER APPLICATION;  Surgeon: Marcine Matar, MD;  Location: [facility];  Service: Urology;  Laterality: Left;   MALONEY DILATION N/A 09/06/2016   Procedure: Elease Hashimoto DILATION;  Surgeon: Corbin Ade, MD;  [facility];  Service: Endoscopy;  Laterality: N/A;   MALONEY DILATION N/A 08/26/2018   Procedure: Elease Hashimoto DILATION;  Surgeon: Corbin Ade, MD;  [facility];  Service: Endoscopy;  Laterality: N/A;   POLYPECTOMY  08/26/2018   Procedure: POLYPECTOMY;  Surgeon: Corbin Ade, MD;  [facility];  Service: Endoscopy;;  polyp at descending colon x2   TONSILLECTOMY      Family History  Problem Relation Age of Onset   Cancer Mother        lung   Thyroid  disease Mother    Diabetes Father    GI Bleed Father    Aneurysm Father    AAA (abdominal aortic aneurysm) Father    AAA (abdominal aortic aneurysm) Brother    Colon cancer Neg Hx    Colon polyps Neg Hx    Rectal cancer Neg Hx    Stomach cancer Neg Hx     Allergies  Allergen Reactions   Peanut-Containing Drug Products Hives   Penicillins Hives    Has patient had a PCN reaction causing immediate rash, facial/tongue/throat swelling, SOB or lightheadedness with hypotension: No Has patient had a PCN reaction causing severe rash involving mucus membranes or skin necrosis: No Has patient had a PCN reaction that required hospitalization No Has patient had a PCN reaction occurring within the last 10 years: No If all of the above answers are "NO", then may proceed with Cephalosporin use.   Quinolones     Patient was warned about not using Cipro and similar antibiotics. Recent studies have raised concern that fluoroquinolone antibiotics could be associated with an increased risk of aortic aneurysm Fluoroquinolones have non-antimicrobial properties that might jeopardise the integrity of the extracellular matrix of the vascular wall In a  propensity score matched cohort study in [place], there was a 66% increased rate of aortic aneurysm or dissection associated with oral fluoroquinolone use, compared wit    Current Outpatient Medications on File Prior to Visit  Medication Sig Dispense Refill   amLODipine (NORVASC) 10 MG tablet Take 1 tablet (10 mg total) by mouth daily. 90 tablet 3   cetirizine (ZYRTEC) 10 MG tablet Take 10 mg by mouth daily as needed for allergies.      Cyanocobalamin (VITAMIN B-12 PO) Take 1 tablet by mouth daily.      diphenhydrAMINE HCl (BENADRYL ALLERGY PO) Take 1 tablet by mouth daily.     dutasteride (AVODART) 0.5 MG capsule Take 1 capsule (0.5 mg total) by mouth daily. 90 capsule 3   EPINEPHrine 0.3 mg/0.3 mL IJ SOAJ injection Inject 0.3 mLs (0.3 mg total) into the  muscle as needed for anaphylaxis. 1 each 1   fluticasone (FLONASE) 50 MCG/ACT nasal spray INSTILL TWO SPRAYS IN EACH NOSTRIL DAILY AS NEEDED FOR ALLERGIES 48 g 1   ketoconazole (NIZORAL) 2 % shampoo Apply 1 Application topically 2 (two) times a week. 120 mL 0   lansoprazole (PREVACID) 30  MG capsule TAKE ONE CAPSULE BY MOUTH DAILY BEFORE BREAKFAST. 90 capsule 3   loperamide (IMODIUM) 2 MG capsule Take by mouth as needed for diarrhea or loose stools. Patient takes every 2-3 days.     memantine (NAMENDA) 10 MG tablet Take 1 tablet (10 mg)  twice a day 180 tablet 3   Simethicone (GAS-X PO) Take 1 tablet by mouth daily as needed.      tamsulosin (FLOMAX) 0.4 MG CAPS capsule Take 0.8 mg by mouth daily.     triamcinolone ointment (KENALOG) 0.5 % Apply 1 Application topically 2 (two) times daily. 45 g 1   vitamin C (ASCORBIC ACID) 500 MG tablet Take 500 mg by mouth daily.     No current facility-administered medications on file prior to visit.    BP 138/80   Pulse (!) 55   Temp 98.1 F (36.7 C) (Oral)   Ht 5\' 10"  (1.778 m)   Wt 192 lb (87.1 kg)   SpO2 98%   BMI 27.55 kg/m       Objective:   Physical Exam Vitals and nursing note reviewed.  Constitutional:      Appearance: Normal appearance.  HENT:     Head:      Comments: Localized redness and warmth noted behind bilateral ears.  This area is flat, no active drainage noted. Skin:    Findings: Erythema and rash present.  Neurological:     Mental Status: He is alert.  Psychiatric:        Mood and Affect: Mood normal.        Behavior: Behavior normal.        Thought Content: Thought content normal.        Judgment: Judgment normal.        Assessment & Plan:  1. Cellulitis of face -It is hard to tell if this is a rash or more cellulitic in nature.  Since his prescription creams have not been working we will trial him on doxycycline twice daily x 7 days.  He will follow-up with me if his symptoms are not improving.  He does have  an appointment with a dermatologist but this is not until March 2025. - doxycycline (VIBRAMYCIN) 100 MG capsule; Take 1 capsule (100 mg total) by mouth 2 (two) times daily.  Dispense: 14 capsule; Refill: 0  Shirline Frees, NP

## 2023-05-30 ENCOUNTER — Telehealth: Payer: Self-pay | Admitting: Adult Health

## 2023-05-30 DIAGNOSIS — L03211 Cellulitis of face: Secondary | ICD-10-CM

## 2023-05-30 NOTE — Telephone Encounter (Signed)
Please advise 

## 2023-05-30 NOTE — Telephone Encounter (Signed)
Pt requesting another rx for doxycycline (VIBRAMYCIN) 100 MG capsule . Says he is finishing the previous one but the condition is still there

## 2023-06-01 MED ORDER — DOXYCYCLINE HYCLATE 100 MG PO CAPS: 100.0000 mg | ORAL_CAPSULE | Freq: Two times a day (BID) | ORAL | 0 refills | Status: DC

## 2023-06-01 NOTE — Telephone Encounter (Signed)
Pt stated he feels much better and think that another round of abx will help

## 2023-06-01 NOTE — Addendum Note (Signed)
Addended by: Waymon Amato R on: 06/01/2023 04:57 PM   Modules accepted: Orders

## 2023-06-01 NOTE — Telephone Encounter (Signed)
Prescription sent to pharmacy. No other action needed. Pt informed! 

## 2023-07-13 ENCOUNTER — Encounter: Payer: Medicare Other | Admitting: Adult Health

## 2023-07-17 ENCOUNTER — Other Ambulatory Visit: Payer: Self-pay | Admitting: Adult Health

## 2023-07-17 DIAGNOSIS — L03211 Cellulitis of face: Secondary | ICD-10-CM

## 2023-08-02 ENCOUNTER — Encounter: Payer: Self-pay | Admitting: *Deleted

## 2023-08-20 NOTE — Progress Notes (Unsigned)
GI Office Note    Referring Provider: Shirline Frees, NP Primary Care Physician:  Shirline Frees, NP  Primary Gastroenterologist:  Chief Complaint   No chief complaint on file.   History of Present Illness   Daniel Guerrero is a 78 y.o. male presenting today    Last seen in July 2024.  Chronic GERD, Schatzki ring status post dilation in 2020, tubular adenomas with surveillance due in 2025 if health permits.  At last office visit he was noting decreased appetite, early satiety, fatigue.  Scant blood in the stool recently but not persistent.  Also with intermittent loose stools.  Labs updated showing unremarkable CBC, AST of 64 with chronic elevation noted previously, TTG IgA less than 2, serum IgA 208.  We were unable to get in touch with him regarding his lab results and plan.  Plan for ultrasound and potentially EGD along with colonoscopy.    EGD 08/2018: -Mild Schatzki ring status post dilation -Small hiatal hernia -Normal stomach, duodenal bulb, second portion of duodenum  Colonoscopy February 2020: -Diverticulosis -two 4 to 6 mm polyps in the descending colon, tubular adenomas -Colonoscopy in 5 years with health permitting  Medications   Current Outpatient Medications  Medication Sig Dispense Refill   amLODipine (NORVASC) 10 MG tablet Take 1 tablet (10 mg total) by mouth daily. 90 tablet 3   cetirizine (ZYRTEC) 10 MG tablet Take 10 mg by mouth daily as needed for allergies.      Cyanocobalamin (VITAMIN B-12 PO) Take 1 tablet by mouth daily.      diphenhydrAMINE HCl (BENADRYL ALLERGY PO) Take 1 tablet by mouth daily.     doxycycline (VIBRAMYCIN) 100 MG capsule Take 1 capsule (100 mg total) by mouth 2 (two) times daily. 14 capsule 0   dutasteride (AVODART) 0.5 MG capsule Take 1 capsule (0.5 mg total) by mouth daily. 90 capsule 3   EPINEPHrine 0.3 mg/0.3 mL IJ SOAJ injection Inject 0.3 mLs (0.3 mg total) into the muscle as needed for anaphylaxis. 1 each 1    fluticasone (FLONASE) 50 MCG/ACT nasal spray INSTILL TWO SPRAYS IN EACH NOSTRIL DAILY AS NEEDED FOR ALLERGIES 48 g 1   ketoconazole (NIZORAL) 2 % shampoo Apply 1 Application topically 2 (two) times a week. 120 mL 0   lansoprazole (PREVACID) 30 MG capsule TAKE ONE CAPSULE BY MOUTH DAILY BEFORE BREAKFAST. 90 capsule 3   loperamide (IMODIUM) 2 MG capsule Take by mouth as needed for diarrhea or loose stools. Patient takes every 2-3 days.     memantine (NAMENDA) 10 MG tablet Take 1 tablet (10 mg)  twice a day 180 tablet 3   Simethicone (GAS-X PO) Take 1 tablet by mouth daily as needed.      tamsulosin (FLOMAX) 0.4 MG CAPS capsule Take 0.8 mg by mouth daily.     triamcinolone ointment (KENALOG) 0.5 % Apply 1 Application topically 2 (two) times daily. 45 g 1   vitamin C (ASCORBIC ACID) 500 MG tablet Take 500 mg by mouth daily.     No current facility-administered medications for this visit.    Allergies   Allergies as of 08/21/2023 - Review Complete 05/24/2023  Allergen Reaction Noted   Peanut-containing drug products Hives 12/31/2009   Penicillins Hives 03/11/2008   Quinolones  04/11/2018     Past Medical History   Past Medical History:  Diagnosis Date   Allergic rhinitis 08/01/2010   Amnestic MCI (mild cognitive impairment with memory loss) 11/17/2022   Asthma with acute  exacerbation 11/09/2014   Asymmetrical sensorineural hearing loss 02/06/2018   Left Ear   Benign paroxysmal positional vertigo 04/16/2014   BPH (benign prostatic hyperplasia)    Cataract of both eyes    implants both eyes    Contact dermatitis 12/13/2007   Dysphagia 08/04/2010   Eczema    Enlarged aorta (HCC)    Erectile dysfunction    GERD (gastroesophageal reflux disease) 11/26/2018   Headache 12/19/2016   Headache, common migraine, intractable 09/09/2010   History of colonic polyps 11/26/2018   History of inguinal hernia repair, bilateral    Hyperlipidemia 04/14/2016   Hypertension    Kidney stones     Meniere disease    Paroxysmal SVT (supraventricular tachycardia) (HCC)    Psoriasis 12/13/2007   S/P appendectomy    S/P tonsillectomy    Sinus bradycardia 06/10/2015   Varicose veins of right lower extremity with complications 10/30/2016    Past Surgical History   Past Surgical History:  Procedure Laterality Date   Ablasion     APPENDECTOMY     CARDIAC ELECTROPHYSIOLOGY MAPPING AND ABLATION  2015   CHOLECYSTECTOMY     COLONOSCOPY     unsure where or when- states greater than 10 yrs ago per pt- was normal per pt and his wife    COLONOSCOPY WITH PROPOFOL N/A 08/26/2018   two tubular adenomas,  diverticulosis.  Repeat colonoscopy in February 2025 if overall health permits.   CYSTOSCOPY WITH RETROGRADE PYELOGRAM, URETEROSCOPY AND STENT PLACEMENT Left 12/30/2014   Procedure: CYSTOSCOPY WITH LEFT URETEROSCOPY STONE EXTRACTION WITH STENT;  Surgeon: Marcine Matar, MD;  Location: [facility];  Service: Urology;  Laterality: Left;   ELECTROPHYSIOLOGIC STUDY N/A 08/24/2015   Procedure: SVT Ablation;  Surgeon: Hillis Range, MD;  Location: [facility] INVASIVE CV LAB;  Service: Cardiovascular;  Laterality: N/A;   ENDOVENOUS ABLATION SAPHENOUS VEIN W/ LASER Right 12/04/2016   endovenous laser ablation right greater saphenous vein by Josephina Gip MD    ESOPHAGOGASTRODUODENOSCOPY N/A 08/18/2016   Procedure: ESOPHAGOGASTRODUODENOSCOPY (EGD);  Surgeon: Corbin Ade, MD;  [facility];  Service: Endoscopy;  Laterality: N/A;   ESOPHAGOGASTRODUODENOSCOPY N/A 09/06/2016   Procedure: ESOPHAGOGASTRODUODENOSCOPY (EGD);  Surgeon: Corbin Ade, MD;  [facility];  Service: Endoscopy;  Laterality: N/A;  100 - moved to 2/14 @ 2:15 per Darlina Rumpf   ESOPHAGOGASTRODUODENOSCOPY (EGD) WITH PROPOFOL N/A 08/26/2018   Mild Schatzki ring status post dilation, small hiatal hernia.   EYE SURGERY     Catracts removed both eye 30 yrs ago   HERNIA REPAIR     right and left   HOLMIUM LASER APPLICATION Left  12/30/2014   Procedure: HOLMIUM LASER APPLICATION;  Surgeon: Marcine Matar, MD;  Location: [facility];  Service: Urology;  Laterality: Left;   MALONEY DILATION N/A 09/06/2016   Procedure: Elease Hashimoto DILATION;  Surgeon: Corbin Ade, MD;  [facility];  Service: Endoscopy;  Laterality: N/A;   MALONEY DILATION N/A 08/26/2018   Procedure: Elease Hashimoto DILATION;  Surgeon: Corbin Ade, MD;  [facility];  Service: Endoscopy;  Laterality: N/A;   POLYPECTOMY  08/26/2018   Procedure: POLYPECTOMY;  Surgeon: Corbin Ade, MD;  [facility];  Service: Endoscopy;;  polyp at descending colon x2   TONSILLECTOMY      Past Family History   Family History  Problem Relation Age of Onset   Cancer Mother        lung   Thyroid disease Mother    Diabetes  Father    GI Bleed Father    Aneurysm Father    AAA (abdominal aortic aneurysm) Father    AAA (abdominal aortic aneurysm) Brother    Colon cancer Neg Hx    Colon polyps Neg Hx    Rectal cancer Neg Hx    Stomach cancer Neg Hx     Past Social History   Social History   Socioeconomic History   Marital status: Married    Spouse name: Not on file   Number of children: 3   Years of education: 16   Highest education level: Bachelor's degree (e.g., BA, AB, BS)  Occupational History   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  Tobacco Use   Smoking status: Former    Current packs/day: 0.00    Types: Cigarettes    Quit date: 07/24/1964    Years since quitting: 59.1   Smokeless tobacco: Never   Tobacco comments:    Quit 20-30 years ago; smoked pipe and cigars   Vaping Use   Vaping status: Never Used  Substance and Sexual Activity   Alcohol use: Yes    Alcohol/week: 2.0 standard drinks of alcohol    Types: 2 Cans of beer per week    Comment: 2 per day   Drug use: No   Sexual activity: Not on file  Other Topics Concern   Not on file  Social History Narrative   Daily Caffeine use: 5-6 drinks daily       08/07/2018: No drug use, some daily alcohol use at night but patient does not feel like it disrupts his health or is a safety concern.       Lives with wife in two story home; three sons, five grandchildren in [place] area.   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    Right handed            Social Drivers of Health   Financial Resource Strain: Low Risk  (04/09/2023)   Overall Financial Resource Strain (CARDIA)    Difficulty of Paying Living Expenses: Not very hard  Food Insecurity: No Food Insecurity (04/09/2023)   Hunger Vital Sign    Worried About Running Out of Food in the Last Year: Never true    Ran Out of Food in the Last Year: Never true  Transportation Needs: No Transportation Needs (04/09/2023)   PRAPARE - Administrator, Civil Service (Medical): No    Lack of Transportation (Non-Medical): No  Physical Activity: Insufficiently Active (04/09/2023)   Exercise Vital Sign    Days of Exercise per Week: 3 days    Minutes of Exercise per Session: 20 min  Stress: No Stress Concern Present (04/09/2023)   [name]    Feeling of Stress : Not at all  Social Connections: Socially Integrated (04/09/2023)   Social Connection and Isolation Panel [NHANES]    Frequency of Communication with Friends and Family: Twice a week    Frequency of Social Gatherings with Friends and Family: Twice a week    Attends Religious Services: More than 4 times per year    Active Member of [employer] or Organizations: Yes    Attends Banker Meetings: More than 4 times per year    Marital Status: Married  Catering manager Violence: Not At Risk (09/22/2022)   Humiliation, Afraid, Rape, and Kick questionnaire    Fear of Current or  Ex-Partner: No    Emotionally Abused: No    Physically Abused: No    Sexually Abused: No    Review of Systems   General:  Negative for anorexia, weight loss, fever, chills, fatigue, weakness. ENT: Negative for hoarseness, difficulty swallowing , nasal congestion. CV: Negative for chest pain, angina, palpitations, dyspnea on exertion, peripheral edema.  Respiratory: Negative for dyspnea at rest, dyspnea on exertion, cough, sputum, wheezing.  GI: See history of present illness. GU:  Negative for dysuria, hematuria, urinary incontinence, urinary frequency, nocturnal urination.  Endo: Negative for unusual weight change.     Physical Exam   There were no vitals taken for this visit.   General: Well-nourished, well-developed in no acute distress.  Eyes: No icterus. Mouth: Oropharyngeal mucosa moist and pink , no lesions erythema or exudate. Lungs: Clear to auscultation bilaterally.  Heart: Regular rate and rhythm, no murmurs rubs or gallops.  Abdomen: Bowel sounds are normal, nontender, nondistended, no hepatosplenomegaly or masses,  no abdominal bruits or hernia , no rebound or guarding.  Rectal: ***  Extremities: No lower extremity edema. No clubbing or deformities. Neuro: Alert and oriented x 4   Skin: Warm and dry, no jaundice.   Psych: Alert and cooperative, normal mood and affect.  Labs   Lab Results  Component Value Date   NA 140 01/16/2023   CL 102 01/16/2023   K 4.1 01/16/2023   CO2 25 01/16/2023   BUN 18 01/16/2023   CREATININE 1.01 01/16/2023   EGFR 77 01/16/2023   CALCIUM 9.0 01/16/2023   ALBUMIN 4.0 01/16/2023   GLUCOSE 103 (H) 01/16/2023   Lab Results  Component Value Date   ALT 23 01/16/2023   AST 64 (H) 01/16/2023   ALKPHOS 73 01/16/2023   BILITOT 1.0 01/16/2023   Lab Results  Component Value Date   WBC 5.3 01/16/2023   HGB 13.6 01/16/2023   HCT 39.6 01/16/2023   MCV 96 01/16/2023   PLT 245 01/16/2023   Lab Results  Component Value Date   VITAMINB12 766 03/15/2023    Imaging Studies   No results found.  Assessment       PLAN   ***   Leanna Battles. Melvyn Neth,  MHS, PA-C [facility]

## 2023-08-21 ENCOUNTER — Encounter: Payer: Self-pay | Admitting: Gastroenterology

## 2023-08-21 ENCOUNTER — Ambulatory Visit: Payer: Medicare Other | Admitting: Internal Medicine

## 2023-08-21 ENCOUNTER — Ambulatory Visit (INDEPENDENT_AMBULATORY_CARE_PROVIDER_SITE_OTHER): Payer: Medicare Other | Admitting: Gastroenterology

## 2023-08-21 VITALS — BP 112/69 | HR 55 | Temp 97.9°F | Ht 70.0 in | Wt 190.8 lb

## 2023-08-21 DIAGNOSIS — K219 Gastro-esophageal reflux disease without esophagitis: Secondary | ICD-10-CM | POA: Diagnosis not present

## 2023-08-21 DIAGNOSIS — R1310 Dysphagia, unspecified: Secondary | ICD-10-CM

## 2023-08-21 DIAGNOSIS — F1090 Alcohol use, unspecified, uncomplicated: Secondary | ICD-10-CM

## 2023-08-21 DIAGNOSIS — Z860101 Personal history of adenomatous and serrated colon polyps: Secondary | ICD-10-CM

## 2023-08-21 DIAGNOSIS — R7401 Elevation of levels of liver transaminase levels: Secondary | ICD-10-CM | POA: Diagnosis not present

## 2023-08-21 DIAGNOSIS — Z860100 Personal history of colon polyps, unspecified: Secondary | ICD-10-CM

## 2023-08-21 DIAGNOSIS — R7989 Other specified abnormal findings of blood chemistry: Secondary | ICD-10-CM | POA: Insufficient documentation

## 2023-08-21 NOTE — Patient Instructions (Signed)
We will schedule ultrasound to further evaluate your liver. We will be in touch with results as available.  Continue to limit your alcohol use.   Continue lansoprazole daily before breakfast.   As discussed, we will hold off on colonoscopy as long as you are not having any concerns or issues. Please let us know if you have any changes in bowel habits.   Call with any questions or concerns.  Return office visit in one year for routine follow up.

## 2023-08-23 ENCOUNTER — Ambulatory Visit (HOSPITAL_COMMUNITY)
Admission: RE | Admit: 2023-08-23 | Discharge: 2023-08-23 | Disposition: A | Discharge status: Home / Self Care | Payer: Medicare Other | Source: Ambulatory Visit | Attending: Gastroenterology | Admitting: Gastroenterology

## 2023-08-23 DIAGNOSIS — R7989 Other specified abnormal findings of blood chemistry: Secondary | ICD-10-CM | POA: Diagnosis not present

## 2023-08-23 DIAGNOSIS — Z860100 Personal history of colon polyps, unspecified: Secondary | ICD-10-CM | POA: Insufficient documentation

## 2023-08-23 DIAGNOSIS — K7689 Other specified diseases of liver: Secondary | ICD-10-CM | POA: Diagnosis not present

## 2023-08-23 DIAGNOSIS — K219 Gastro-esophageal reflux disease without esophagitis: Secondary | ICD-10-CM | POA: Insufficient documentation

## 2023-08-23 DIAGNOSIS — Z9049 Acquired absence of other specified parts of digestive tract: Secondary | ICD-10-CM | POA: Diagnosis not present

## 2023-08-23 DIAGNOSIS — N1330 Unspecified hydronephrosis: Secondary | ICD-10-CM | POA: Diagnosis not present

## 2023-08-27 ENCOUNTER — Other Ambulatory Visit: Payer: Self-pay

## 2023-08-27 DIAGNOSIS — R7989 Other specified abnormal findings of blood chemistry: Secondary | ICD-10-CM

## 2023-09-06 DIAGNOSIS — R7989 Other specified abnormal findings of blood chemistry: Secondary | ICD-10-CM | POA: Diagnosis not present

## 2023-09-07 LAB — HEPATIC FUNCTION PANEL
ALT: 13 [IU]/L (ref 0–44)
AST: 41 [IU]/L — ABNORMAL HIGH (ref 0–40)
Albumin: 4.3 g/dL (ref 3.8–4.8)
Alkaline Phosphatase: 110 [IU]/L (ref 44–121)
Bilirubin Total: 1 mg/dL (ref 0.0–1.2)
Bilirubin, Direct: 0.29 mg/dL (ref 0.00–0.40)
Total Protein: 6.5 g/dL (ref 6.0–8.5)

## 2023-09-13 ENCOUNTER — Ambulatory Visit: Payer: Medicare Other | Admitting: Adult Health

## 2023-09-14 ENCOUNTER — Other Ambulatory Visit: Payer: Self-pay | Admitting: Adult Health

## 2023-09-14 NOTE — Telephone Encounter (Signed)
Copied from CRM [id number]. Topic: Clinical - Medication Refill >> Sep 14, 2023  7:59 AM Aletta Edouard wrote: Most Recent Primary Care Visit:  Provider: Shirline Frees  Department: [facility]  Visit Type: OFFICE VISIT  Date: 05/24/2023  Medication:   Flomax              dutasyeride    Has the patient contacted their pharmacy? Yes (Agent: If no, request that the patient contact the pharmacy for the refill. If patient does not wish to contact the pharmacy document the reason why and proceed with request.) (Agent: If yes, when and what did the pharmacy advise?)  Is this the correct pharmacy for this prescription? Yes If no, delete pharmacy and type the correct one.  This is the patient's preferred pharmacy:  [facility] - [place], [place] - [address] [place] [address] Phone: [phone] Fax: [phone]  [id number] - [place], [place] - [address] [facility] [address] [place] [address] Phone: [phone] Fax: [phone]   Has the prescription been filled recently? No  Is the patient out of the medication? Yes  Has the patient been seen for an appointment in the last year OR does the patient have an upcoming appointment? No  Can we respond through MyChart? Yes  Agent: Please be advised that Rx refills may take up to 3 business days. We ask that you follow-up with your pharmacy.

## 2023-09-18 ENCOUNTER — Ambulatory Visit: Payer: Medicare Other | Admitting: Physician Assistant

## 2023-09-18 ENCOUNTER — Encounter: Payer: Self-pay | Admitting: Physician Assistant

## 2023-09-18 VITALS — BP 139/76 | HR 50 | Resp 20 | Ht 70.0 in | Wt 187.0 lb

## 2023-09-18 DIAGNOSIS — R413 Other amnesia: Secondary | ICD-10-CM | POA: Diagnosis not present

## 2023-09-18 DIAGNOSIS — F09 Unspecified mental disorder due to known physiological condition: Secondary | ICD-10-CM | POA: Diagnosis not present

## 2023-09-18 DIAGNOSIS — R4189 Other symptoms and signs involving cognitive functions and awareness: Secondary | ICD-10-CM | POA: Diagnosis not present

## 2023-09-18 DIAGNOSIS — G3184 Mild cognitive impairment, so stated: Secondary | ICD-10-CM

## 2023-09-18 MED ORDER — MEMANTINE HCL 10 MG PO TABS: ORAL_TABLET | ORAL | 3 refills | Status: AC

## 2023-09-18 NOTE — Progress Notes (Signed)
 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 Patient is on memantine 10 mg twice daily, tolerating well.  Unfortunately, no other medications can be added, in view of sinus bradycardia.  Short-term memory may be slightly worse.  MMSE today 25/30.  He still able to participate on his ADLs and drive without difficulties.  Mood is good.  He discontinued drinking.     Recommendations:   Follow up in  6 months. Continue B1 replenishment Repeat neuropsych evaluation not earlier than August 2025. Recommend rechecking  hearing to improve comprehension Continue memantine 10 mg twice daily, side effects discussed He politely declines having other imaging performed at this time Recommend good control of cardiovascular risk factors Continue to control mood as per PCP    Subjective:   This patient is accompanied in the office by his wife  who supplements the history. Previous records as well as any outside records available were reviewed prior to todays visit.   Patient was last seen on 03/15/2023     Any changes in memory since last visit? "  Maybe a little better", STM worse than LTM.  Likes to play computer games and going to [place] but not very frequently in person, he prefers attending via online.  He no longer does any brain stimulating exercises.  repeats oneself?  Endorsed by the patient, but "better than before" Disoriented when walking into a room?  Patient denies   Misplacing objects?  Patient denies   Wandering behavior?    denies   Any personality changes since last visit?  "He never yells at me anymore, since not drinking "-wife says. Any worsening depression?: denies.   Hallucinations or paranoia?  Denies.   Seizures?   Denies    Any sleep changes? Sleeps well, may wake up in the middle of the night.  Reports vivid dreams sometimes it may feel real, denies REM behavior or sleepwalking   Sleep apnea?   Denies.    Any hygiene concerns?   Denies.   Independent of bathing and dressing?  Endorsed  Does the patient needs help with medications? Patient is in charge, preparing them weekly  Who is in charge of the finances?  Wife is in charge, she is an Airline pilot     Any changes in appetite?  "Very good".     Patient have trouble swallowing?  Denies.   Does the patient cook? Yes. . Any kitchen accidents such as leaving the stove on?  Denies   Any headaches?    Denies.   Vision changes? Denies. Chronic pain?  Denies.   Ambulates with difficulty?  He likes to walk with his wife, he also uses a stationary bike and treadmill.has not bee very active because of the cold weather  Recent falls or head injuries?  Denies.      Unilateral weakness, numbness or tingling?  Denies.   Any tremors?  Denies.   Any anosmia? Endorsed.   Any incontinence of urine? Endorsed, has to get up at night, sees urology  Any bowel dysfunction?  Intermittent, chronic diarrhea due to gallbladder disease, but  he reports that it is improving. Patient lives with his wife  Does the patient drive?yes sometimes I have to stop and think but denies getting lost Alcohol?  He decreased significantly his alcohol intake, at most 1 beer every so often.    Neuropsych evaluation 11/17/2022 briefly, results suggested a primary impairment surrounding all aspects of learning and memory. Additional impairments were exhibited across processing speed, executive functioning (i.e., cognitive flexibility and response inhibition), clock drawing, and semantic fluency.  The etiology for ongoing cognitive impairment is unclear. With that being said, I do have elevated concerns surrounding an underlying neurodegenerative illness, namely Alzheimer's disease. Across memory testing, [name] did not benefit from repeated learning opportunities, was fully amnestic (i.e., 0% retention) across two verbal memory measures after a brief delay and only exhibited 22% retention on a visual task, and performed very poorly across all yes/no recognition testing. Taken together, this suggests rapid forgetting and a pronounced storage impairment, both of which are hallmark characteristics of this illness. His further semantic fluency impairment is also worrisome for typical disease progression. Testing patterns align well with day-to-day reporting of [name]'s children noting that he is repetitive in conversation and commonly tells the same stories multiple times. Intact confrontation naming is encouraging and could suggest earlier disease staging if this illness is truly present. Mild microvascular ischemic disease and scattered frontoparietal microhemorrhages seen on his most recent brain MRI (2022) may exacerbate ongoing dysfunction. These findings would not explain amnestic memory performances across testing. Continued medical monitoring will be important moving forward.       Initial visit 03/02/21 the patient is seen in neurologic consultation at the request of Shirline Frees, NP for the evaluation of memory.  The patient is accompanied by wife Daniel Guerrero who supplements the history. 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  He states that recalling names and numbers became more difficult, even numbers that he had dialed frequently.  "I have been always bad with dates, so I was not too worried when that all I  could not remember "    His mood has been good, without depression or irritability.  He likes to enjoy his retirement, working on his yard, walking with his wife, and using his stationary bicycle.  He still would like to go back to teaching if he could.  He falls asleep well at night, but because of frequent urination he wakes up about 3 times to go to the bathroom, which interferes with his sleep.  He has begun taking Flomax which seems to help somehow.  He denies any vivid dreams or sleepwalking.  He denies hallucinations, paranoia, or leaving objects in unusual places.  He is independent of bathing and dressing.  Occasionally, he forgets to take a dose of medication.  He tries to set then in front of the breakfast table in a pillbox and his wife monitors closely.  She also manages the finances, she is an Airline pilot, and has been doing so "forever ".  His appetite is good, but when he was having worse diarrhea, had lost a significant amount of weight, using this opportunity to  eat better and stay within the 180 to 190 pound range.  He denies trouble swallowing.  He never cooks, his wife always did the cooking. He ambulates without difficulty without the use  of a walker or a cane.  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  He drives and uses his GPS, without getting lost  -he depends so much of his GPS, but at times he may feel lost without it.  He denies any recent headaches -He has a history of migraines, but they are not life limiting with a frequency of about once a month.  He denies any falls, head injuries, double vision, dizziness, vertigo, focal numbness or tingling, unilateral weakness or tremors, denies anosmia, never had COVID.  He denies a history of sleep apnea, alcohol or tobacco abuse.  Family history negative for dementia.     MRI brain 02/16/21  No evidence of acute intracranial  abnormality.  Mild chronic small vessel ischemic changes within the cerebral white matter and pons, similar as compared to the brain MRI of 01/25/2018. 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  Mild generalized cerebral and cerebellar atrophy.   Mild bilateral ethmoid and maxillary sinus mucosal thickening   Past Medical History:  Diagnosis Date   Allergic rhinitis 08/01/2010   Amnestic MCI (mild cognitive impairment with memory loss) 11/17/2022   Asthma with acute exacerbation 11/09/2014   Asymmetrical sensorineural hearing loss 02/06/2018   Left Ear   Benign paroxysmal positional vertigo 04/16/2014   BPH (benign prostatic hyperplasia)    Cataract of both eyes    implants both eyes    Contact dermatitis 12/13/2007   Dysphagia 08/04/2010   Eczema    Enlarged aorta (HCC)    Erectile dysfunction    GERD (gastroesophageal reflux disease) 11/26/2018   Headache 12/19/2016   Headache, common migraine, intractable 09/09/2010   History of colonic polyps 11/26/2018   History of inguinal hernia repair, bilateral    Hyperlipidemia 04/14/2016   Hypertension    Kidney stones    Meniere disease    Paroxysmal SVT (supraventricular tachycardia) (HCC)    Psoriasis 12/13/2007   S/P appendectomy    S/P tonsillectomy    Sinus bradycardia 06/10/2015   Varicose veins of right lower extremity with complications 10/30/2016     Past Surgical History:  Procedure Laterality Date   Ablasion     APPENDECTOMY     CARDIAC ELECTROPHYSIOLOGY MAPPING AND ABLATION  2015   CHOLECYSTECTOMY     COLONOSCOPY     unsure where or when- states greater than 10 yrs ago per pt- was normal per pt and his wife    COLONOSCOPY WITH PROPOFOL N/A 08/26/2018   two tubular adenomas,  diverticulosis.  Repeat colonoscopy in February 2025 if overall health permits.   CYSTOSCOPY WITH RETROGRADE PYELOGRAM,  URETEROSCOPY AND STENT PLACEMENT Left 12/30/2014   Procedure: CYSTOSCOPY WITH LEFT URETEROSCOPY STONE EXTRACTION WITH STENT;  Surgeon: Marcine Matar, MD;  Location: [facility];  Service: Urology;  Laterality: Left;   ELECTROPHYSIOLOGIC STUDY N/A 08/24/2015   Procedure: SVT Ablation;  Surgeon: Hillis Range, MD;  Location: [facility];  Service: Cardiovascular;  Laterality: N/A;   ENDOVENOUS ABLATION SAPHENOUS VEIN W/ LASER Right 12/04/2016   endovenous laser ablation right greater saphenous vein by Josephina Gip MD    ESOPHAGOGASTRODUODENOSCOPY N/A 08/18/2016   Procedure: ESOPHAGOGASTRODUODENOSCOPY (EGD);  Surgeon: Corbin Ade, MD;  Location: [facility];  Service: Endoscopy;  Laterality: N/A;   ESOPHAGOGASTRODUODENOSCOPY N/A 09/06/2016  Procedure: ESOPHAGOGASTRODUODENOSCOPY (EGD);  Surgeon: Corbin Ade, MD;  Location: [facility];  Service: Endoscopy;  Laterality: N/A;  100 - moved to 2/14 @ 2:15 per Darlina Rumpf   ESOPHAGOGASTRODUODENOSCOPY (EGD) WITH PROPOFOL N/A 08/26/2018   Mild Schatzki ring status post dilation, small hiatal hernia.   EYE SURGERY     Catracts removed both eye 30 yrs ago   HERNIA REPAIR     right and left   HOLMIUM LASER APPLICATION Left 12/30/2014   Procedure: HOLMIUM LASER APPLICATION;  Surgeon: Marcine Matar, MD;  Location: [facility];  Service: Urology;  Laterality: Left;   MALONEY DILATION N/A 09/06/2016   Procedure: Elease Hashimoto DILATION;  Surgeon: Corbin Ade, MD;  Location: [facility];  Service: Endoscopy;  Laterality: N/A;   MALONEY DILATION N/A 08/26/2018   Procedure: Elease Hashimoto DILATION;  Surgeon: Corbin Ade, MD;  Location: [facility];  Service: Endoscopy;  Laterality: N/A;   POLYPECTOMY  08/26/2018   Procedure: POLYPECTOMY;  Surgeon: Corbin Ade, MD;  Location: [facility];  Service: Endoscopy;;  polyp at descending colon x2   TONSILLECTOMY       PREVIOUS MEDICATIONS:   CURRENT MEDICATIONS:  Outpatient Encounter Medications as of  09/18/2023  Medication Sig   amLODipine (NORVASC) 10 MG tablet Take 1 tablet (10 mg total) by mouth daily.   cetirizine (ZYRTEC) 10 MG tablet Take 10 mg by mouth daily as needed for allergies.   Cyanocobalamin (VITAMIN B-12 PO) Take 1 tablet by mouth daily.    diphenhydrAMINE HCl (BENADRYL ALLERGY PO) Take 1 tablet by mouth daily.   dutasteride (AVODART) 0.5 MG capsule Take 1 capsule (0.5 mg total) by mouth daily.   EPINEPHrine 0.3 mg/0.3 mL IJ SOAJ injection Inject 0.3 mLs (0.3 mg total) into the muscle as needed for anaphylaxis.   fluticasone (FLONASE) 50 MCG/ACT nasal spray INSTILL TWO SPRAYS IN EACH NOSTRIL DAILY AS NEEDED FOR ALLERGIES   ketoconazole (NIZORAL) 2 % shampoo Apply 1 Application topically 2 (two) times a week.   lansoprazole (PREVACID) 30 MG capsule TAKE ONE CAPSULE BY MOUTH DAILY BEFORE BREAKFAST.   loperamide (IMODIUM) 2 MG capsule Take by mouth as needed for diarrhea or loose stools. Patient takes every 2-3 days.   Simethicone (GAS-X PO) Take 1 tablet by mouth daily as needed.    tamsulosin (FLOMAX) 0.4 MG CAPS capsule Take 0.8 mg by mouth daily.   triamcinolone ointment (KENALOG) 0.5 % Apply 1 Application topically 2 (two) times daily.   vitamin C (ASCORBIC ACID) 500 MG tablet Take 500 mg by mouth daily.   [DISCONTINUED] memantine (NAMENDA) 10 MG tablet Take 1 tablet (10 mg)  twice a day   memantine (NAMENDA) 10 MG tablet Take 1 tablet (10 mg)  twice a day   No facility-administered encounter medications on file as of 09/18/2023.     Objective:     PHYSICAL EXAMINATION:    VITALS:   Vitals:   09/18/23 1245  BP: 139/76  Pulse: (!) 50  Resp: 20  SpO2: 96%  Weight: 187 lb (84.8 kg)  Height: 5\' 10"  (1.778 m)    GEN:  The patient appears stated age and is in NAD. HEENT:  Normocephalic, atraumatic.   Neurological examination:  General: NAD, well-groomed, appears stated age. Orientation: The patient is alert. Oriented to person, place and date Cranial  nerves: There is good facial symmetry.The speech is fluent and clear. No aphasia or dysarthria. Fund of knowledge is appropriate. Recent memory impaired and remote memory  is normal.  Attention and concentration are normal.  Able to name objects and repeat phrases.  Hearing is decreased, left greater than right, delayed recall 0/3 Sensation: Sensation is intact to light touch throughout Motor: Strength is at least antigravity x4. DTR's 2/4 in UE/LE      03/02/2021    3:00 PM  Montreal Cognitive Assessment   Visuospatial/ Executive (0/5) 4  Naming (0/3) 2  Attention: Read list of digits (0/2) 2  Attention: Read list of letters (0/1) 1  Attention: Serial 7 subtraction starting at 100 (0/3) 3  Language: Repeat phrase (0/2) 2  Language : Fluency (0/1) 1  Abstraction (0/2) 1  Delayed Recall (0/5) 0  Orientation (0/6) 4  Total 20  Adjusted Score (based on education) 20       09/18/2023    1:00 PM 09/07/2022   12:00 PM 03/07/2022   12:00 PM  MMSE - Mini Mental State Exam  Orientation to time 4 5 2   Orientation to Place 5 5 5   Registration 3 3 3   Attention/ Calculation 4 4 4   Recall 0 2 2  Language- name 2 objects 2 2 2   Language- repeat 1 1 1   Language- follow 3 step command 3 3 3   Language- read & follow direction 1 1 1   Write a sentence 1 1 0  Copy design 1 1 1   Total score 25 28 24        Movement examination: Tone: There is normal tone in the UE/LE Abnormal movements:  no tremor.  No myoclonus.  No asterixis.   Coordination:  There is no decremation with RAM's. Normal finger to nose  Gait and Station: The patient has no difficulty arising out of a deep-seated chair without the use of the hands. The patient's stride length is good.  Gait is cautious and narrow.   Thank you for allowing [place] the opportunity to participate in the care of this nice patient. Please do not hesitate to contact us for any questions or concerns.   Total time spent on today's visit was 35 minutes  dedicated to this patient today, preparing to see patient, examining the patient, ordering tests and/or medications and counseling the patient, documenting clinical information in the EHR or other health record, independently interpreting results and communicating results to the patient/family, discussing treatment and goals, answering patient's questions and coordinating care.  Cc:  Shirline Frees, NP  Marlowe Kays 09/18/2023 1:39 PM

## 2023-09-18 NOTE — Patient Instructions (Addendum)
 It was a pleasure to see you today at our office.   Recommendations:  Continue memantine 10 mg  1 tablet  twice daily.   B1 replenishment at 100 mg daily  Continue B12 Follow up in 6 months Recheck hearing to improve comprehension Repeat the neuropsychological evaluation in the near future    RECOMMENDATIONS FOR ALL PATIENTS WITH MEMORY PROBLEMS: 1. Continue to exercise (Recommend 30 minutes of walking everyday, or 3 hours every week) 2. Increase social interactions - continue going to [place] and enjoy social gatherings with friends and family 3. Eat healthy, avoid fried foods and eat more fruits and vegetables 4. Maintain adequate blood pressure, blood sugar, and blood cholesterol level. Reducing the risk of stroke and cardiovascular disease also helps promoting better memory. 5. Avoid stressful situations. Live a simple life and avoid aggravations. Organize your time and prepare for the next day in anticipation. 6. Sleep well, avoid any interruptions of sleep and avoid any distractions in the bedroom that may interfere with adequate sleep quality 7. Avoid sugar, avoid sweets as there is a strong link between excessive sugar intake, diabetes, and cognitive impairment We discussed the Mediterranean diet, which has been shown to help patients reduce the risk of progressive memory disorders and reduces cardiovascular risk. This includes eating fish, eat fruits and green leafy vegetables, nuts like almonds and hazelnuts, walnuts, and also use olive oil. Avoid fast foods and fried foods as much as possible. Avoid sweets and sugar as sugar use has been linked to worsening of memory function.  There is always a concern of gradual progression of memory problems. If this is the case, then we may need to adjust level of care according to patient needs. Support, both to the patient and caregiver, should then be put into place.     FALL PRECAUTIONS: Be cautious when walking. Scan the area for  obstacles that may increase the risk of trips and falls. When getting up in the mornings, sit up at the edge of the bed for a few minutes before getting out of bed. Consider elevating the bed at the head end to avoid drop of blood pressure when getting up. Walk always in a well-lit room (use night lights in the walls). Avoid area rugs or power cords from appliances in the middle of the walkways. Use a walker or a cane if necessary and consider physical therapy for balance exercise. Get your eyesight checked regularly.  FINANCIAL OVERSIGHT: Supervision, especially oversight when making financial decisions or transactions is also recommended.  HOME SAFETY: Consider the safety of the kitchen when operating appliances like stoves, microwave oven, and blender. Consider having supervision and share cooking responsibilities until no longer able to participate in those. Accidents with firearms and other hazards in the house should be identified and addressed as well.   ABILITY TO BE LEFT ALONE: If patient is unable to contact 911 operator, consider using LifeLine, or when the need is there, arrange for someone to stay with patients. Smoking is a fire hazard, consider supervision or cessation. Risk of wandering should be assessed by caregiver and if detected at any point, supervision and safe proof recommendations should be instituted.  MEDICATION SUPERVISION: Inability to self-administer medication needs to be constantly addressed. Implement a mechanism to ensure safe administration of the medications.   DRIVING: Regarding driving, in patients with progressive memory problems, driving will be impaired. We advise to have someone else do the driving if trouble finding directions or if minor accidents are reported.  Independent driving assessment is available to determine safety of driving.   If you are interested in the driving assessment, you can contact the following:  [employer] in [place]  [phone]  [facility] [phone]  [facility] [phone] or [phone]    Mediterranean Diet A Mediterranean diet refers to food and lifestyle choices that are based on the traditions of countries located on the [employer]. This way of eating has been shown to help prevent certain conditions and improve outcomes for people who have chronic diseases, like kidney disease and heart disease. What are tips for following this plan? Lifestyle  Cook and eat meals together with your family, when possible. Drink enough fluid to keep your urine clear or pale yellow. Be physically active every day. This includes: Aerobic exercise like running or swimming. Leisure activities like gardening, walking, or housework. Get 7-8 hours of sleep each night. If recommended by your health care provider, drink red wine in moderation. This means 1 glass a day for nonpregnant women and 2 glasses a day for men. A glass of wine equals 5 oz (150 mL). Reading food labels  Check the serving size of packaged foods. For foods such as rice and pasta, the serving size refers to the amount of cooked product, not dry. Check the total fat in packaged foods. Avoid foods that have saturated fat or trans fats. Check the ingredients list for added sugars, such as corn syrup. Shopping  At the grocery store, buy most of your food from the areas near the walls of the store. This includes: Fresh fruits and vegetables (produce). Grains, beans, nuts, and seeds. Some of these may be available in unpackaged forms or large amounts (in bulk). Fresh seafood. Poultry and eggs. Low-fat dairy products. Buy whole ingredients instead of prepackaged foods. Buy fresh fruits and vegetables in-season from local farmers markets. Buy frozen fruits and vegetables in resealable bags. If you do not have access to quality fresh seafood, buy precooked frozen shrimp or canned  fish, such as tuna, salmon, or sardines. Buy small amounts of raw or cooked vegetables, salads, or olives from the deli or salad bar at your store. Stock your pantry so you always have certain foods on hand, such as olive oil, canned tuna, canned tomatoes, rice, pasta, and beans. Cooking  Cook foods with extra-virgin olive oil instead of using butter or other vegetable oils. Have meat as a side dish, and have vegetables or grains as your main dish. This means having meat in small portions or adding small amounts of meat to foods like pasta or stew. Use beans or vegetables instead of meat in common dishes like chili or lasagna. Experiment with different cooking methods. Try roasting or broiling vegetables instead of steaming or sauteing them. Add frozen vegetables to soups, stews, pasta, or rice. Add nuts or seeds for added healthy fat at each meal. You can add these to yogurt, salads, or vegetable dishes. Marinate fish or vegetables using olive oil, lemon juice, garlic, and fresh herbs. Meal planning  Plan to eat 1 vegetarian meal one day each week. Try to work up to 2 vegetarian meals, if possible. Eat seafood 2 or more times a week. Have healthy snacks readily available, such as: Vegetable sticks with hummus. Greek yogurt. Fruit and nut trail mix. Eat balanced meals throughout the week. This includes: Fruit: 2-3 servings a day Vegetables: 4-5 servings a day Low-fat dairy: 2 servings a day Fish, poultry, or  lean meat: 1 serving a day Beans and legumes: 2 or more servings a week Nuts and seeds: 1-2 servings a day Whole grains: 6-8 servings a day Extra-virgin olive oil: 3-4 servings a day Limit red meat and sweets to only a few servings a month What are my food choices? Mediterranean diet Recommended Grains: Whole-grain pasta. Brown rice. Bulgar wheat. Polenta. Couscous. Whole-wheat bread. Orpah Cobb. Vegetables: Artichokes. Beets. Broccoli. Cabbage. Carrots. Eggplant. Green  beans. Chard. Kale. Spinach. Onions. Leeks. Peas. Squash. Tomatoes. Peppers. Radishes. Fruits: Apples. Apricots. Avocado. Berries. Bananas. Cherries. Dates. Figs. Grapes. Lemons. Melon. Oranges. Peaches. Plums. Pomegranate. Meats and other protein foods: Beans. Almonds. Sunflower seeds. Pine nuts. Peanuts. Cod. Salmon. Scallops. Shrimp. Tuna. Tilapia. Clams. Oysters. Eggs. Dairy: Low-fat milk. Cheese. Greek yogurt. Beverages: Water. Red wine. Herbal tea. Fats and oils: Extra virgin olive oil. Avocado oil. Grape seed oil. Sweets and desserts: [place] yogurt with honey. Baked apples. Poached pears. Trail mix. Seasoning and other foods: Basil. Cilantro. Coriander. Cumin. Mint. Parsley. Sage. Rosemary. Tarragon. Garlic. Oregano. Thyme. Pepper. Balsalmic vinegar. Tahini. Hummus. Tomato sauce. Olives. Mushrooms. Limit these Grains: Prepackaged pasta or rice dishes. Prepackaged cereal with added sugar. Vegetables: Deep fried potatoes (french fries). Fruits: Fruit canned in syrup. Meats and other protein foods: Beef. Pork. Lamb. Poultry with skin. Hot dogs. Tomasa Blase. Dairy: Ice cream. Sour cream. Whole milk. Beverages: Juice. Sugar-sweetened soft drinks. Beer. Liquor and spirits. Fats and oils: Butter. Canola oil. Vegetable oil. Beef fat (tallow). Lard. Sweets and desserts: Cookies. Cakes. Pies. Candy. Seasoning and other foods: Mayonnaise. Premade sauces and marinades. The items listed may not be a complete list. Talk with your dietitian about what dietary choices are right for you. Summary The Mediterranean diet includes both food and lifestyle choices. Eat a variety of fresh fruits and vegetables, beans, nuts, seeds, and whole grains. Limit the amount of red meat and sweets that you eat. Talk with your health care provider about whether it is safe for you to drink red wine in moderation. This means 1 glass a day for nonpregnant women and 2 glasses a day for men. A glass of wine equals 5 oz (150  mL). This information is not intended to replace advice given to you by your health care provider. Make sure you discuss any questions you have with your health care provider. Document Released: 03/02/2016 Document Revised: 04/04/2016 Document Reviewed: 03/02/2016 Elsevier Interactive Patient Education  2017 [employer].

## 2023-09-19 ENCOUNTER — Ambulatory Visit: Payer: Medicare Other | Admitting: Adult Health

## 2023-09-19 VITALS — BP 120/80 | HR 52 | Temp 98.2°F | Ht 70.0 in | Wt 187.2 lb

## 2023-09-19 DIAGNOSIS — R351 Nocturia: Secondary | ICD-10-CM

## 2023-09-19 DIAGNOSIS — L570 Actinic keratosis: Secondary | ICD-10-CM | POA: Diagnosis not present

## 2023-09-19 DIAGNOSIS — N401 Enlarged prostate with lower urinary tract symptoms: Secondary | ICD-10-CM

## 2023-09-19 MED ORDER — TAMSULOSIN HCL 0.4 MG PO CAPS
0.8000 mg | ORAL_CAPSULE | Freq: Every day | ORAL | 1 refills | Status: DC
Start: 2023-09-19 — End: 2023-11-01

## 2023-09-19 NOTE — Progress Notes (Signed)
 Subjective:    Patient ID: Daniel Guerrero, male    DOB: [birth year], 78 y.o.   MRN: [id number]  HPI 78 year old male who is being evaluated today for concern of a rash on the crown of his head.  Reports that he feels rough patches on the top of his head.  He has been treated with antibiotics in the past for suspected cellulitis on the top of his head and he is wondering if he needs to go back on antibiotics.  He also needs a refill of tamsulosin.   Review of Systems See HPI   Past Medical History:  Diagnosis Date   Allergic rhinitis 08/01/2010   Amnestic MCI (mild cognitive impairment with memory loss) 11/17/2022   Asthma with acute exacerbation 11/09/2014   Asymmetrical sensorineural hearing loss 02/06/2018   Left Ear   Benign paroxysmal positional vertigo 04/16/2014   BPH (benign prostatic hyperplasia)    Cataract of both eyes    implants both eyes    Contact dermatitis 12/13/2007   Dysphagia 08/04/2010   Eczema    Enlarged aorta (HCC)    Erectile dysfunction    GERD (gastroesophageal reflux disease) 11/26/2018   Headache 12/19/2016   Headache, common migraine, intractable 09/09/2010   History of colonic polyps 11/26/2018   History of inguinal hernia repair, bilateral    Hyperlipidemia 04/14/2016   Hypertension    Kidney stones    Meniere disease    Paroxysmal SVT (supraventricular tachycardia) (HCC)    Psoriasis 12/13/2007   S/P appendectomy    S/P tonsillectomy    Sinus bradycardia 06/10/2015   Varicose veins of right lower extremity with complications 10/30/2016    Social History   Socioeconomic History   Marital status: Married    Spouse name: Not on file   Number of children: 3   Years of education: 16   Highest education level: Bachelor's degree (e.g., BA, AB, BS)  Occupational History   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  Tobacco Use   Smoking status: Former    Current packs/day: 0.00    Types: Cigarettes    Quit date: 07/24/1964     Years since quitting: 59.1   Smokeless tobacco: Never   Tobacco comments:    Quit 20-30 years ago; smoked pipe and cigars   Vaping Use   Vaping status: Never Used  Substance and Sexual Activity   Alcohol use: Yes    Alcohol/week: 2.0 standard drinks of alcohol    Types: 2 Cans of beer per week    Comment: 2 per day   Drug use: No   Sexual activity: Not on file  Other Topics Concern   Not on file  Social History Narrative   Daily Caffeine use: 5-6 drinks daily      08/07/2018: No drug use, some daily alcohol use at night but patient does not feel like it disrupts his health or is a safety concern.       Lives with wife in two story home; three sons, five grandchildren in [place] area.   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    Right handed            Social Drivers of Health   Financial Resource Strain: Low Risk  (04/09/2023)   Overall Financial Resource Strain (CARDIA)    Difficulty of Paying Living Expenses: Not very hard  Food Insecurity: No Food Insecurity (04/09/2023)   Hunger Vital  Sign    Worried About Programme researcher, broadcasting/film/video in the Last Year: Never true    Ran Out of Food in the Last Year: Never true  Transportation Needs: No Transportation Needs (09/19/2023)   PRAPARE - Administrator, Civil Service (Medical): No    Lack of Transportation (Non-Medical): No  Physical Activity: Unknown (09/19/2023)   Exercise Vital Sign    Days of Exercise per Week: 1 day    Minutes of Exercise per Session: Patient declined  Stress: No Stress Concern Present (09/19/2023)   [name]    Feeling of Stress : Not at all  Social Connections: Socially Integrated (04/09/2023)   Social Connection and Isolation Panel [NHANES]    Frequency of Communication with Friends and Family: Twice a week    Frequency of Social Gatherings with Friends and Family:  Twice a week    Attends Religious Services: More than 4 times per year    Active Member of [employer] or Organizations: Yes    Attends Engineer, structural: More than 4 times per year    Marital Status: Married  Catering manager Violence: Not At Risk (09/22/2022)   Humiliation, Afraid, Rape, and Kick questionnaire    Fear of Current or Ex-Partner: No    Emotionally Abused: No    Physically Abused: No    Sexually Abused: No    Past Surgical History:  Procedure Laterality Date   Ablasion     APPENDECTOMY     CARDIAC ELECTROPHYSIOLOGY MAPPING AND ABLATION  2015   CHOLECYSTECTOMY     COLONOSCOPY     unsure where or when- states greater than 10 yrs ago per pt- was normal per pt and his wife    COLONOSCOPY WITH PROPOFOL N/A 08/26/2018   two tubular adenomas,  diverticulosis.  Repeat colonoscopy in February 2025 if overall health permits.   CYSTOSCOPY WITH RETROGRADE PYELOGRAM, URETEROSCOPY AND STENT PLACEMENT Left 12/30/2014   Procedure: CYSTOSCOPY WITH LEFT URETEROSCOPY STONE EXTRACTION WITH STENT;  Surgeon: Marcine Matar, MD;  Location: [facility];  Service: Urology;  Laterality: Left;   ELECTROPHYSIOLOGIC STUDY N/A 08/24/2015   Procedure: SVT Ablation;  Surgeon: Hillis Range, MD;  Location: [facility];  Service: Cardiovascular;  Laterality: N/A;   ENDOVENOUS ABLATION SAPHENOUS VEIN W/ LASER Right 12/04/2016   endovenous laser ablation right greater saphenous vein by Josephina Gip MD    ESOPHAGOGASTRODUODENOSCOPY N/A 08/18/2016   Procedure: ESOPHAGOGASTRODUODENOSCOPY (EGD);  Surgeon: Corbin Ade, MD;  Location: [facility];  Service: Endoscopy;  Laterality: N/A;   ESOPHAGOGASTRODUODENOSCOPY N/A 09/06/2016   Procedure: ESOPHAGOGASTRODUODENOSCOPY (EGD);  Surgeon: Corbin Ade, MD;  Location: [facility];  Service: Endoscopy;  Laterality: N/A;  100 - moved to 2/14 @ 2:15 per Darlina Rumpf   ESOPHAGOGASTRODUODENOSCOPY (EGD) WITH PROPOFOL N/A 08/26/2018   Mild Schatzki ring  status post dilation, small hiatal hernia.   EYE SURGERY     Catracts removed both eye 30 yrs ago   HERNIA REPAIR     right and left   HOLMIUM LASER APPLICATION Left 12/30/2014   Procedure: HOLMIUM LASER APPLICATION;  Surgeon: Marcine Matar, MD;  Location: [facility];  Service: Urology;  Laterality: Left;   MALONEY DILATION N/A 09/06/2016   Procedure: Elease Hashimoto DILATION;  Surgeon: Corbin Ade, MD;  Location: [facility];  Service: Endoscopy;  Laterality: N/A;   MALONEY DILATION N/A 08/26/2018   Procedure: Elease Hashimoto DILATION;  Surgeon: Corbin Ade, MD;  Location: [facility];  Service: Endoscopy;  Laterality: N/A;   POLYPECTOMY  08/26/2018   Procedure: POLYPECTOMY;  Surgeon: Corbin Ade, MD;  Location: [facility];  Service: Endoscopy;;  polyp at descending colon x2   TONSILLECTOMY      Family History  Problem Relation Age of Onset   Cancer Mother        lung   Thyroid disease Mother    Diabetes Father    GI Bleed Father    Aneurysm Father    AAA (abdominal aortic aneurysm) Father    AAA (abdominal aortic aneurysm) Brother    Colon cancer Neg Hx    Colon polyps Neg Hx    Rectal cancer Neg Hx    Stomach cancer Neg Hx     Allergies  Allergen Reactions   Peanut-Containing Drug Products Hives   Penicillins Hives    Has patient had a PCN reaction causing immediate rash, facial/tongue/throat swelling, SOB or lightheadedness with hypotension: No Has patient had a PCN reaction causing severe rash involving mucus membranes or skin necrosis: No Has patient had a PCN reaction that required hospitalization No Has patient had a PCN reaction occurring within the last 10 years: No If all of the above answers are "NO", then may proceed with Cephalosporin use.   Quinolones     Patient was warned about not using Cipro and similar antibiotics. Recent studies have raised concern that fluoroquinolone antibiotics could be associated with an increased risk of aortic  aneurysm Fluoroquinolones have non-antimicrobial properties that might jeopardise the integrity of the extracellular matrix of the vascular wall In a  propensity score matched cohort study in [place], there was a 66% increased rate of aortic aneurysm or dissection associated with oral fluoroquinolone use, compared wit    Current Outpatient Medications on File Prior to Visit  Medication Sig Dispense Refill   amLODipine (NORVASC) 10 MG tablet Take 1 tablet (10 mg total) by mouth daily. 90 tablet 3   cetirizine (ZYRTEC) 10 MG tablet Take 10 mg by mouth daily as needed for allergies.     Cyanocobalamin (VITAMIN B-12 PO) Take 1 tablet by mouth daily.      diphenhydrAMINE HCl (BENADRYL ALLERGY PO) Take 1 tablet by mouth daily.     dutasteride (AVODART) 0.5 MG capsule Take 1 capsule (0.5 mg total) by mouth daily. 90 capsule 3   EPINEPHrine 0.3 mg/0.3 mL IJ SOAJ injection Inject 0.3 mLs (0.3 mg total) into the muscle as needed for anaphylaxis. 1 each 1   fluticasone (FLONASE) 50 MCG/ACT nasal spray INSTILL TWO SPRAYS IN EACH NOSTRIL DAILY AS NEEDED FOR ALLERGIES 48 g 1   ketoconazole (NIZORAL) 2 % shampoo Apply 1 Application topically 2 (two) times a week. 120 mL 0   lansoprazole (PREVACID) 30 MG capsule TAKE ONE CAPSULE BY MOUTH DAILY BEFORE BREAKFAST. 90 capsule 3   loperamide (IMODIUM) 2 MG capsule Take by mouth as needed for diarrhea or loose stools. Patient takes every 2-3 days.     memantine (NAMENDA) 10 MG tablet Take 1 tablet (10 mg)  twice a day 180 tablet 3   Simethicone (GAS-X PO) Take 1 tablet by mouth daily as needed.      tamsulosin (FLOMAX) 0.4 MG CAPS capsule Take 0.8 mg by mouth daily.     triamcinolone ointment (KENALOG) 0.5 % Apply 1 Application topically 2 (two) times daily. 45 g 1   vitamin C (ASCORBIC ACID) 500 MG tablet Take 500 mg by mouth daily.  No current facility-administered medications on file prior to visit.    BP 120/80   Pulse (!) 52   Temp 98.2 F (36.8 C)  (Oral)   Ht 5\' 10"  (1.778 m)   Wt 187 lb 3.2 oz (84.9 kg)   SpO2 95%   BMI 26.86 kg/m       Objective:   Physical Exam Vitals and nursing note reviewed.  Constitutional:      Appearance: Normal appearance.  HENT:     Head:   Skin:    General: Skin is warm and dry.  Neurological:     General: No focal deficit present.     Mental Status: He is alert and oriented to person, place, and time.  Psychiatric:        Mood and Affect: Mood normal.        Behavior: Behavior normal.        Thought Content: Thought content normal.        Judgment: Judgment normal.       Assessment & Plan:  1. Actinic keratosis (Primary) -Will consent obtained.  Using cryotherapy each ( 2) AK's was frozen using 3 freeze thaw cycles.  Patient tolerated procedure well.  Aftercare instructions reviewed.  2. Benign prostatic hyperplasia with nocturia  - tamsulosin (FLOMAX) 0.4 MG CAPS capsule; Take 2 capsules (0.8 mg total) by mouth daily.  Dispense: 90 capsule; Refill: 1  Shirline Frees, NP

## 2023-10-03 ENCOUNTER — Ambulatory Visit (INDEPENDENT_AMBULATORY_CARE_PROVIDER_SITE_OTHER): Payer: Medicare Other

## 2023-10-03 VITALS — BP 120/60 | HR 62 | Temp 98.1°F | Ht 70.0 in | Wt 186.5 lb

## 2023-10-03 DIAGNOSIS — Z0000 Encounter for general adult medical examination without abnormal findings: Secondary | ICD-10-CM | POA: Diagnosis not present

## 2023-10-03 NOTE — Progress Notes (Signed)
 Subjective:   Daniel Guerrero is a 78 y.o. male who presents for Medicare Annual/Subsequent preventive examination.  Visit Complete: In person    Cardiac Risk Factors include: advanced age (>65men, >54 women);hypertension;male gender     Objective:    Today's Vitals   10/03/23 1458  BP: 120/60  Pulse: 62  Temp: 98.1 F (36.7 C)  TempSrc: Oral  SpO2: 95%  Weight: 186 lb 8 oz (84.6 kg)  Height: 5\' 10"  (1.778 m)   Body mass index is 26.76 kg/m.     10/03/2023    3:21 PM 09/18/2023   12:45 PM 03/15/2023   12:45 PM 09/22/2022   10:47 AM 09/07/2022   10:49 AM 03/06/2022   12:58 PM 09/20/2021   10:44 AM  Advanced Directives  Does Patient Have a Medical Advance Directive? Yes Yes Yes Yes Yes Yes Yes  Type of Estate agent of [place];Living will Healthcare Power of [employer] Power of [employer] Power of [place];Living will   Healthcare Power of [place];Living will  Does patient want to make changes to medical advance directive? No - Patient declined No - Patient declined No - Patient declined No - Patient declined   No - Patient declined  Copy of Healthcare Power of Attorney in Chart? Yes - validated most recent copy scanned in chart (See row information) No - copy requested No - copy requested Yes - validated most recent copy scanned in chart (See row information)   No - copy requested    Current Medications (verified) Outpatient Encounter Medications as of 10/03/2023  Medication Sig   amLODipine (NORVASC) 10 MG tablet Take 1 tablet (10 mg total) by mouth daily.   cetirizine (ZYRTEC) 10 MG tablet Take 10 mg by mouth daily as needed for allergies.   Cyanocobalamin (VITAMIN B-12 PO) Take 1 tablet by mouth daily.    diphenhydrAMINE HCl (BENADRYL ALLERGY PO) Take 1 tablet by mouth daily.   dutasteride (AVODART) 0.5 MG capsule Take 1 capsule (0.5 mg total) by mouth daily.   EPINEPHrine 0.3 mg/0.3 mL IJ SOAJ injection Inject 0.3 mLs (0.3 mg  total) into the muscle as needed for anaphylaxis.   fluticasone (FLONASE) 50 MCG/ACT nasal spray INSTILL TWO SPRAYS IN EACH NOSTRIL DAILY AS NEEDED FOR ALLERGIES   ketoconazole (NIZORAL) 2 % shampoo Apply 1 Application topically 2 (two) times a week.   lansoprazole (PREVACID) 30 MG capsule TAKE ONE CAPSULE BY MOUTH DAILY BEFORE BREAKFAST.   loperamide (IMODIUM) 2 MG capsule Take by mouth as needed for diarrhea or loose stools. Patient takes every 2-3 days.   memantine (NAMENDA) 10 MG tablet Take 1 tablet (10 mg)  twice a day   Simethicone (GAS-X PO) Take 1 tablet by mouth daily as needed.    tamsulosin (FLOMAX) 0.4 MG CAPS capsule Take 2 capsules (0.8 mg total) by mouth daily.   triamcinolone ointment (KENALOG) 0.5 % Apply 1 Application topically 2 (two) times daily.   vitamin C (ASCORBIC ACID) 500 MG tablet Take 500 mg by mouth daily.   No facility-administered encounter medications on file as of 10/03/2023.    Allergies (verified) Peanut-containing drug products, Penicillins, and Quinolones   History: Past Medical History:  Diagnosis Date   Allergic rhinitis 08/01/2010   Amnestic MCI (mild cognitive impairment with memory loss) 11/17/2022   Asthma with acute exacerbation 11/09/2014   Asymmetrical sensorineural hearing loss 02/06/2018   Left Ear   Benign paroxysmal positional vertigo 04/16/2014   BPH (benign prostatic hyperplasia)  Cataract of both eyes    implants both eyes    Contact dermatitis 12/13/2007   Dysphagia 08/04/2010   Eczema    Enlarged aorta (HCC)    Erectile dysfunction    GERD (gastroesophageal reflux disease) 11/26/2018   Headache 12/19/2016   Headache, common migraine, intractable 09/09/2010   History of colonic polyps 11/26/2018   History of inguinal hernia repair, bilateral    Hyperlipidemia 04/14/2016   Hypertension    Kidney stones    Meniere disease    Paroxysmal SVT (supraventricular tachycardia) (HCC)    Psoriasis 12/13/2007   S/P appendectomy     S/P tonsillectomy    Sinus bradycardia 06/10/2015   Varicose veins of right lower extremity with complications 10/30/2016   Past Surgical History:  Procedure Laterality Date   Ablasion     APPENDECTOMY     CARDIAC ELECTROPHYSIOLOGY MAPPING AND ABLATION  2015   CHOLECYSTECTOMY     COLONOSCOPY     unsure where or when- states greater than 10 yrs ago per pt- was normal per pt and his wife    COLONOSCOPY WITH PROPOFOL N/A 08/26/2018   two tubular adenomas,  diverticulosis.  Repeat colonoscopy in February 2025 if overall health permits.   CYSTOSCOPY WITH RETROGRADE PYELOGRAM, URETEROSCOPY AND STENT PLACEMENT Left 12/30/2014   Procedure: CYSTOSCOPY WITH LEFT URETEROSCOPY STONE EXTRACTION WITH STENT;  Surgeon: Marcine Matar, MD;  Location: [facility];  Service: Urology;  Laterality: Left;   ELECTROPHYSIOLOGIC STUDY N/A 08/24/2015   Procedure: SVT Ablation;  Surgeon: Hillis Range, MD;  Location: [facility] INVASIVE CV LAB;  Service: Cardiovascular;  Laterality: N/A;   ENDOVENOUS ABLATION SAPHENOUS VEIN W/ LASER Right 12/04/2016   endovenous laser ablation right greater saphenous vein by Josephina Gip MD    ESOPHAGOGASTRODUODENOSCOPY N/A 08/18/2016   Procedure: ESOPHAGOGASTRODUODENOSCOPY (EGD);  Surgeon: Corbin Ade, MD;  [facility];  Service: Endoscopy;  Laterality: N/A;   ESOPHAGOGASTRODUODENOSCOPY N/A 09/06/2016   Procedure: ESOPHAGOGASTRODUODENOSCOPY (EGD);  Surgeon: Corbin Ade, MD;  [facility];  Service: Endoscopy;  Laterality: N/A;  100 - moved to 2/14 @ 2:15 per Darlina Rumpf   ESOPHAGOGASTRODUODENOSCOPY (EGD) WITH PROPOFOL N/A 08/26/2018   Mild Schatzki ring status post dilation, small hiatal hernia.   EYE SURGERY     Catracts removed both eye 30 yrs ago   HERNIA REPAIR     right and left   HOLMIUM LASER APPLICATION Left 12/30/2014   Procedure: HOLMIUM LASER APPLICATION;  Surgeon: Marcine Matar, MD;  Location: [facility];  Service: Urology;  Laterality: Left;    MALONEY DILATION N/A 09/06/2016   Procedure: Elease Hashimoto DILATION;  Surgeon: Corbin Ade, MD;  [facility];  Service: Endoscopy;  Laterality: N/A;   MALONEY DILATION N/A 08/26/2018   Procedure: Elease Hashimoto DILATION;  Surgeon: Corbin Ade, MD;  [facility];  Service: Endoscopy;  Laterality: N/A;   POLYPECTOMY  08/26/2018   Procedure: POLYPECTOMY;  Surgeon: Corbin Ade, MD;  [facility];  Service: Endoscopy;;  polyp at descending colon x2   TONSILLECTOMY     Family History  Problem Relation Age of Onset   Cancer Mother        lung   Thyroid disease Mother    Diabetes Father    GI Bleed Father    Aneurysm Father    AAA (abdominal aortic aneurysm) Father    AAA (abdominal aortic aneurysm) Brother    Colon cancer Neg Hx    Colon polyps Neg  Hx    Rectal cancer Neg Hx    Stomach cancer Neg Hx    Social History   Socioeconomic History   Marital status: Married    Spouse name: Not on file   Number of children: 3   Years of education: 16   Highest education level: Bachelor's degree (e.g., BA, AB, BS)  Occupational History   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  Tobacco Use   Smoking status: Former    Current packs/day: 0.00    Types: Cigarettes    Quit date: 07/24/1964    Years since quitting: 59.2   Smokeless tobacco: Never   Tobacco comments:    Quit 20-30 years ago; smoked pipe and cigars   Vaping Use   Vaping status: Never Used  Substance and Sexual Activity   Alcohol use: Yes    Alcohol/week: 2.0 standard drinks of alcohol    Types: 2 Cans of beer per week    Comment: 2 per day   Drug use: No   Sexual activity: Not on file  Other Topics Concern   Not on file  Social History Narrative   Daily Caffeine use: 5-6 drinks daily      08/07/2018: No drug use, some daily alcohol use at night but patient does not feel like it disrupts his health or is a safety concern.       Lives with wife in two story home; three sons, five  grandchildren in /[place] area.   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    Right handed            Social Drivers of Health   Financial Resource Strain: Low Risk  (10/03/2023)   Overall Financial Resource Strain (CARDIA)    Difficulty of Paying Living Expenses: Not hard at all  Food Insecurity: No Food Insecurity (10/03/2023)   Hunger Vital Sign    Worried About Running Out of Food in the Last Year: Never true    Ran Out of Food in the Last Year: Never true  Transportation Needs: No Transportation Needs (10/03/2023)   PRAPARE - Administrator, Civil Service (Medical): No    Lack of Transportation (Non-Medical): No  Physical Activity: Insufficiently Active (10/03/2023)   Exercise Vital Sign    Days of Exercise per Week: 2 days    Minutes of Exercise per Session: 30 min  Stress: No Stress Concern Present (10/03/2023)   [name]    Feeling of Stress : Not at all  Social Connections: Socially Integrated (10/03/2023)   Social Connection and Isolation Panel [NHANES]    Frequency of Communication with Friends and Family: More than three times a week    Frequency of Social Gatherings with Friends and Family: More than three times a week    Attends Religious Services: More than 4 times per year    Active Member of [employer] or Organizations: Yes    Attends Engineer, structural: More than 4 times per year    Marital Status: Married    Tobacco Counseling Counseling given: Not Answered Tobacco comments: Quit 20-30 years ago; smoked pipe and cigars    Clinical Intake:  Pre-visit preparation completed: Yes  Pain : No/denies pain     BMI - recorded: 26.76 Nutritional Status: BMI 25 -29 Overweight Nutritional Risks: None Diabetes: No  How often do you need to have someone help  you when you read instructions, pamphlets, or other  written materials from your doctor or pharmacy?: 1 - Never  Interpreter Needed?: No  Information entered by :: Theresa Mulligan LPN   Activities of Daily Living    10/03/2023    3:20 PM  In your present state of health, do you have any difficulty performing the following activities:  Hearing? 1  Comment Wears Hearing Aids  Vision? 0  Difficulty concentrating or making decisions? 0  Walking or climbing stairs? 0  Dressing or bathing? 0  Doing errands, shopping? 0  Preparing Food and eating ? N  Using the Toilet? N  In the past six months, have you accidently leaked urine? N  Do you have problems with loss of bowel control? N  Managing your Medications? N  Managing your Finances? N  Housekeeping or managing your Housekeeping? N    Patient Care Team: Shirline Frees, NP as PCP - General (Family Medicine) Laqueta Linden, MD (Inactive) as PCP - Cardiology (Cardiology) Delight Ovens, MD (Inactive) as Consulting Physician (Cardiothoracic Surgery) Hillis Range, MD (Inactive) as Consulting Physician (Cardiology) Laqueta Linden, MD (Inactive) as Attending Physician (Cardiology) Jene Every, MD as Consulting Physician (Orthopedic Surgery) Crista Elliot, MD as Consulting Physician (Urology) Sinda Du, MD as Consulting Physician (Ophthalmology) Jena Gauss Gerrit Friends, MD as Consulting Physician (Gastroenterology)  Indicate any recent Medical Services you may have received from other than [facility] providers in the past year (date may be approximate).     Assessment:   This is a routine wellness examination for Gladys.  Hearing/Vision screen Hearing Screening - Comments:: Wears Hearing Aids Vision Screening - Comments::  up to date with routine eye exams with  [facility]   Goals Addressed               This Visit's Progress     Increase physical activity (pt-stated)        Stay Active       Depression Screen    10/03/2023    3:04 PM 09/22/2022    10:43 AM 09/20/2021   10:40 AM 08/25/2020    2:42 PM 06/08/2020   10:00 AM 08/15/2019    8:37 AM 08/07/2018    2:58 PM  PHQ 2/9 Scores  PHQ - 2 Score 0 0 0 0 0 0 1  PHQ- 9 Score       2    Fall Risk    10/03/2023    3:20 PM 09/18/2023   12:45 PM 04/09/2023    4:57 PM 03/15/2023   12:45 PM 09/22/2022   10:45 AM  Fall Risk   Falls in the past year? 0 0 1 1 0  Number falls in past yr: 0 0 1 0 0  Injury with Fall? 0 0 1 0 0  Risk for fall due to : No Fall Risks    No Fall Risks  Follow up Falls prevention discussed;Falls evaluation completed Falls evaluation completed  Falls evaluation completed Falls prevention discussed    MEDICARE RISK AT HOME: Medicare Risk at Home Any stairs in or around the home?: Yes If so, are there any without handrails?: No Home free of loose throw rugs in walkways, pet beds, electrical cords, etc?: Yes Adequate lighting in your home to reduce risk of falls?: Yes Life alert?: No Use of a cane, walker or w/c?: No Grab bars in the bathroom?: No Shower chair or bench in shower?: No Elevated toilet seat or a handicapped toilet?: No  TIMED UP AND GO:  Was the test performed?  Yes  Length of time to ambulate 10 feet: 10 sec Gait steady and fast without use of assistive device    Cognitive Function:    09/18/2023    1:00 PM 09/07/2022   12:00 PM 03/07/2022   12:00 PM 02/01/2021    5:45 PM 07/27/2017    1:43 PM  MMSE - Mini Mental State Exam  Not completed:     --  Orientation to time 4 5 2 5    Orientation to Place 5 5 5 5    Registration 3 3 3 3    Attention/ Calculation 4 4 4 4    Recall 0 2 2 1    Language- name 2 objects 2 2 2 2    Language- repeat 1 1 1 1    Language- follow 3 step command 3 3 3 3    Language- read & follow direction 1 1 1 1    Write a sentence 1 1 0 1   Copy design 1 1 1 1    Total score 25 28 24 27        03/02/2021    3:00 PM  Montreal Cognitive Assessment   Visuospatial/ Executive (0/5) 4  Naming (0/3) 2  Attention: Read list of  digits (0/2) 2  Attention: Read list of letters (0/1) 1  Attention: Serial 7 subtraction starting at 100 (0/3) 3  Language: Repeat phrase (0/2) 2  Language : Fluency (0/1) 1  Abstraction (0/2) 1  Delayed Recall (0/5) 0  Orientation (0/6) 4  Total 20  Adjusted Score (based on education) 20      10/03/2023    3:21 PM 09/22/2022   10:47 AM 09/20/2021   10:45 AM 08/25/2020    2:49 PM 08/15/2019    8:38 AM  6CIT Screen  What Year? 0 points 0 points 0 points 0 points 0 points  What month? 0 points 0 points 0 points 0 points 0 points  What time? 0 points 0 points 0 points  0 points  Count back from 20 0 points 0 points 0 points 0 points 0 points  Months in reverse 2 points 2 points 0 points 4 points 0 points  Repeat phrase 4 points 0 points 0 points 10 points 0 points  Total Score 6 points 2 points 0 points  0 points    Immunizations Immunization History  Administered Date(s) Administered   Hepatitis A 09/28/2017   Influenza, High Dose Seasonal PF 04/14/2016   Influenza,inj,Quad PF,6+ Mos 04/16/2014, 05/01/2018   Influenza,inj,quad, With Preservative 05/22/2017   Influenza-Unspecified 05/25/2015, 05/24/2017, 04/08/2019, 03/23/2020, 04/12/2022   Moderna SARS-COV2 Booster Vaccination 11/10/2021, 04/12/2022   PFIZER(Purple Top)SARS-COV-2 Vaccination 09/07/2019, 09/30/2019, 03/20/2020   Pneumococcal Conjugate-13 04/14/2016   Pneumococcal Polysaccharide-23 04/23/2008, 12/18/2013, 05/01/2018   Td 12/13/2007   Tdap 09/28/2017   Zoster Recombinant(Shingrix) 08/10/2017, 12/06/2017    TDAP status: Up to date  Flu Vaccine status: Up to date  Pneumococcal vaccine status: Up to date  Covid-19 vaccine status: Declined, Education has been provided regarding the importance of this vaccine but patient still declined. Advised may receive this vaccine at local pharmacy or Health Dept.or vaccine clinic. Aware to provide a copy of the vaccination record if obtained from local pharmacy or Health Dept.  Verbalized acceptance and understanding.  Qualifies for Shingles Vaccine? Yes   Zostavax completed Yes   Shingrix Completed?: Yes  Screening Tests Health Maintenance  Topic Date Due   Colonoscopy  08/26/2021   COVID-19 Vaccine (4 - 2024-25  season) 03/25/2023   Diabetic kidney evaluation - Urine ACR  07/12/2027 (Originally 12/12/1963)   Diabetic kidney evaluation - eGFR measurement  01/16/2024   Medicare Annual Wellness (AWV)  10/02/2024   DTaP/Tdap/Td (3 - Td or Tdap) 09/29/2027   Pneumonia Vaccine 43+ Years old  Completed   INFLUENZA VACCINE  Completed   Hepatitis C Screening  Completed   Zoster Vaccines- Shingrix  Completed   HPV VACCINES  Aged Out   FOOT EXAM  Discontinued   HEMOGLOBIN A1C  Discontinued   OPHTHALMOLOGY EXAM  Discontinued    Health Maintenance  Health Maintenance Due  Topic Date Due   Colonoscopy  08/26/2021   COVID-19 Vaccine (4 - 2024-25 season) 03/25/2023    Colorectal cancer screening: Referral to GI placed Deferred. Pt aware the office will call re: appt.     Additional Screening:  Hepatitis C Screening: does qualify; Completed 02/20/19  Vision Screening: Recommended annual ophthalmology exams for early detection of glaucoma and other disorders of the eye. Is the patient up to date with their annual eye exam?  Yes  Who is the provider or what is the name of the office in which the patient attends annual eye exams? [facility] If pt is not established with a provider, would they like to be referred to a provider to establish care? No .   Dental Screening: Recommended annual dental exams for proper oral hygiene    Community Resource Referral / Chronic Care Management:  CRR required this visit?  No   CCM required this visit?  No     Plan:     I have personally reviewed and noted the following in the patient's chart:   Medical and social history Use of alcohol, tobacco or illicit drugs  Current medications and supplements  including opioid prescriptions. Patient is not currently taking opioid prescriptions. Functional ability and status Nutritional status Physical activity Advanced directives List of other physicians Hospitalizations, surgeries, and ER visits in previous 12 months Vitals Screenings to include cognitive, depression, and falls Referrals and appointments  In addition, I have reviewed and discussed with patient certain preventive protocols, quality metrics, and best practice recommendations. A written personalized care plan for preventive services as well as general preventive health recommendations were provided to patient.     Tillie Rung, LPN   03/03/9146   After Visit Summary: (In Person-Printed) AVS printed and given to the patient  Nurse Notes: None

## 2023-10-03 NOTE — Patient Instructions (Addendum)
 Mr. Hammers , Thank you for taking time to come for your Medicare Wellness Visit. I appreciate your ongoing commitment to your health goals. Please review the following plan we discussed and let me know if I can assist you in the future.   Referrals/Orders/Follow-Ups/Clinician Recommendations:   This is a list of the screening recommended for you and due dates:  Health Maintenance  Topic Date Due   Colon Cancer Screening  08/26/2021   COVID-19 Vaccine (4 - 2024-25 season) 03/25/2023   Yearly kidney health urinalysis for diabetes  07/12/2027*   Yearly kidney function blood test for diabetes  01/16/2024   Medicare Annual Wellness Visit  10/02/2024   DTaP/Tdap/Td vaccine (3 - Td or Tdap) 09/29/2027   Pneumonia Vaccine  Completed   Flu Shot  Completed   Hepatitis C Screening  Completed   Zoster (Shingles) Vaccine  Completed   HPV Vaccine  Aged Out   Complete foot exam   Discontinued   Hemoglobin A1C  Discontinued   Eye exam for diabetics  Discontinued  *Topic was postponed. The date shown is not the original due date.    Advanced directives: (In Chart) A copy of your advanced directives are scanned into your chart should your provider ever need it.  Next Medicare Annual Wellness Visit scheduled for next year: Yes

## 2023-10-08 ENCOUNTER — Encounter: Payer: Self-pay | Admitting: Dermatology

## 2023-10-08 ENCOUNTER — Ambulatory Visit (INDEPENDENT_AMBULATORY_CARE_PROVIDER_SITE_OTHER): Payer: Medicare Other | Admitting: Dermatology

## 2023-10-08 VITALS — BP 111/70 | HR 53

## 2023-10-08 DIAGNOSIS — W908XXA Exposure to other nonionizing radiation, initial encounter: Secondary | ICD-10-CM

## 2023-10-08 DIAGNOSIS — L578 Other skin changes due to chronic exposure to nonionizing radiation: Secondary | ICD-10-CM

## 2023-10-08 DIAGNOSIS — L570 Actinic keratosis: Secondary | ICD-10-CM

## 2023-10-08 NOTE — Progress Notes (Signed)
   New Patient Visit   Subjective  Daniel Guerrero is a 78 y.o. male who presents for the following: AK  Patient states he has Skin irritation located at the scalp and neck that he  would like to have examined. Patient reports the areas have been there for the majority of his   life (Since about age 7 ) .He states that the areas have not spread. Patient reports he  has previously been treated for these areas (Pt has tried and failed TMC, Desonide, Hydrotisone, Clobetasol). Last used 1-2 weeks ago. Patient denies Hx of bx. Patient denies family history of skin cancer(s).  The patient has spots, moles and lesions to be evaluated, some may be new or changing and the patient may have concern these could be cancer.   The following portions of the chart were reviewed this encounter and updated as appropriate: medications, allergies, medical history  Review of Systems:  No other skin or systemic complaints except as noted in HPI or Assessment and Plan.  Objective  Well appearing patient in no apparent distress; mood and affect are within normal limits.  A focused examination was performed of the following areas: Scalp and Neck  Relevant exam findings are noted in the Assessment and Plan.         Left Forehead, Left Hand - Posterior (5), Left Postauricular Sulcus, Left Temple, Mid Frontal Scalp, Right Hand - Posterior (11), Right Occipital Scalp Erythematous thin papules/macules with gritty scale.   Assessment & Plan   ACTINIC KERATOSIS and ACTINIC DAMAGE Exam: Erythematous thin papules/macules with gritty scale at the upper body  Actinic keratoses are precancerous spots that appear secondary to cumulative UV radiation exposure/sun exposure over time. They are chronic with expected duration over 1 year. A portion of actinic keratoses will progress to squamous cell carcinoma of the skin. It is not possible to reliably predict which spots will progress to skin cancer and so treatment is  recommended to prevent development of skin cancer.  Recommend daily broad spectrum sunscreen SPF 30+ to sun-exposed areas, reapply every 2 hours as needed.  Recommend staying in the shade or wearing long sleeves, sun glasses (UVA+UVB protection) and wide brim hats (4-inch brim around the entire circumference of the hat). Call for new or changing lesions. AK (ACTINIC KERATOSIS) (21) Left Forehead, Left Hand - Posterior (5), Left Postauricular Sulcus, Left Temple, Mid Frontal Scalp, Right Hand - Posterior (11), Right Occipital Scalp Related Procedures Destruction of lesion  Destruction method: cryotherapy   Informed consent: discussed and consent obtained   Timeout:  patient name, date of birth, surgical site, and procedure verified Lesion destroyed using liquid nitrogen: Yes   Cryotherapy cycles:  25 Outcome: patient tolerated procedure well with no complications    Return in about 3 months (around 01/08/2024) for TBSE & AK F/U.  Documentation: I have reviewed the above documentation for accuracy and completeness, and I agree with the above.  Stasia Cavalier, am acting as scribe for Langston Reusing, DO.  Langston Reusing, DO

## 2023-10-08 NOTE — Patient Instructions (Addendum)

## 2023-11-01 ENCOUNTER — Other Ambulatory Visit: Payer: Self-pay | Admitting: Adult Health

## 2023-11-01 DIAGNOSIS — N401 Enlarged prostate with lower urinary tract symptoms: Secondary | ICD-10-CM

## 2023-11-01 MED ORDER — TAMSULOSIN HCL 0.4 MG PO CAPS: 0.8000 mg | ORAL_CAPSULE | Freq: Every day | ORAL | 1 refills | Status: DC

## 2023-11-01 NOTE — Telephone Encounter (Signed)
 Copied from CRM [id number]. Topic: Clinical - Medication Refill >> Nov 01, 2023  8:10 AM Fredrich Romans wrote: Most Recent Primary Care Visit:  Provider: Tillie Rung  Department: [facility]  Visit Type: MEDICARE AWV, SEQUENTIAL  Date: 10/03/2023  Medication: tamsulosin (FLOMAX) 0.4 MG CAPS capsule  Has the patient contacted their pharmacy? Yes (Agent: If no, request that the patient contact the pharmacy for the refill. If patient does not wish to contact the pharmacy document the reason why and proceed with request.) (Agent: If yes, when and what did the pharmacy advise?)  Is this the correct pharmacy for this prescription? Yes If no, delete pharmacy and type the correct one.  This is the patient's preferred pharmacy:  [facility] - [place], [place] - [address] [place] [address] Phone: [phone] Fax: [phone]     Has the prescription been filled recently? No  Is the patient out of the medication? No(3 days left)  Has the patient been seen for an appointment in the last year OR does the patient have an upcoming appointment? Yes  Can we respond through MyChart? Yes  Agent: Please be advised that Rx refills may take up to 3 business days. We ask that you follow-up with your pharmacy.

## 2023-12-19 ENCOUNTER — Ambulatory Visit: Admitting: Dermatology

## 2023-12-19 ENCOUNTER — Encounter: Payer: Self-pay | Admitting: Dermatology

## 2023-12-19 VITALS — BP 145/86 | HR 53

## 2023-12-19 DIAGNOSIS — L821 Other seborrheic keratosis: Secondary | ICD-10-CM

## 2023-12-19 DIAGNOSIS — Z1283 Encounter for screening for malignant neoplasm of skin: Secondary | ICD-10-CM

## 2023-12-19 DIAGNOSIS — L719 Rosacea, unspecified: Secondary | ICD-10-CM

## 2023-12-19 DIAGNOSIS — L814 Other melanin hyperpigmentation: Secondary | ICD-10-CM

## 2023-12-19 DIAGNOSIS — D1801 Hemangioma of skin and subcutaneous tissue: Secondary | ICD-10-CM

## 2023-12-19 DIAGNOSIS — W908XXA Exposure to other nonionizing radiation, initial encounter: Secondary | ICD-10-CM

## 2023-12-19 DIAGNOSIS — L570 Actinic keratosis: Secondary | ICD-10-CM | POA: Diagnosis not present

## 2023-12-19 DIAGNOSIS — L578 Other skin changes due to chronic exposure to nonionizing radiation: Secondary | ICD-10-CM | POA: Diagnosis not present

## 2023-12-19 DIAGNOSIS — D229 Melanocytic nevi, unspecified: Secondary | ICD-10-CM

## 2023-12-19 NOTE — Patient Instructions (Addendum)

## 2023-12-19 NOTE — Progress Notes (Signed)
 Total Body Skin Exam (TBSE) Visit   Subjective  Daniel Guerrero is a 78 y.o. male accompanied by wife Daniel Guerrero) who presents for the following: Skin Cancer Screening and Full Body Skin Exam  Patient presents today for follow up visit for TBSE. Patient was last evaluated on 10/08/23 . Patient denies medication changes. Patient reports he  does have spots, moles and lesions of concern to be evaluated. Patient reports throughout his lifetime he  has had moderate sun exposure. Currently, patient reports if he  has excessive sun exposure, he does apply sunscreen and/or wears protective coverings. Patient reports he  does not have hx of bx. Patient denies  family history of skin cancers. The patient has spots, moles and lesions to be evaluated, some may be new or changing and the patient has concerns that these could be cancer.  The following portions of the chart were reviewed this encounter and updated as appropriate: medications, allergies, medical history  Review of Systems:  No other skin or systemic complaints except as noted in HPI or Assessment and Plan.  Objective  Well appearing patient in no apparent distress; mood and affect are within normal limits.  A full examination was performed including scalp, head, eyes, ears, nose, lips, neck, chest, axillae, abdomen, back, buttocks, bilateral upper extremities, bilateral lower extremities, hands, feet, fingers, toes, fingernails, and toenails. All findings within normal limits unless otherwise noted below.   Relevant physical exam findings are noted in the Assessment and Plan.    Left Breast (2), Left Forehead, Left Mid Helix, Left Occipital Scalp (4), Mid Forehead, Mid Frontal Scalp (2), Right Forehead, Right Mid Helix, Right Occipital Scalp (2), Right Posterior Auricle, Right Posterior Neck, Right Shoulder - Anterior, Right Temple (2), Right Upper Back Erythematous thin papules/macules with gritty scale.   Assessment & Plan    LENTIGINES, SEBORRHEIC KERATOSES, HEMANGIOMAS - Benign normal skin lesions - Benign-appearing - Call for any changes  MELANOCYTIC NEVI - Tan-brown and/or pink-flesh-colored symmetric macules and papules - Benign appearing on exam today - Observation - Call clinic for new or changing moles - Recommend daily use of broad spectrum spf 30+ sunscreen to sun-exposed areas.   MILD ACTINIC DAMAGE - Chronic condition, secondary to cumulative UV/sun exposure - diffuse scaly erythematous macules with underlying dyspigmentation - Recommend daily broad spectrum sunscreen SPF 30+ to sun-exposed areas, reapply every 2 hours as needed.  - Staying in the shade or wearing long sleeves, sun glasses (UVA+UVB protection) and wide brim hats (4-inch brim around the entire circumference of the hat) are also recommended for sun protection.  - Call for new or changing lesions.  ACTINIC KERATOSIS Exam: Erythematous thin papules/macules with gritty scale scattered throughout body  Actinic keratoses are precancerous spots that appear secondary to cumulative UV radiation exposure/sun exposure over time. They are chronic with expected duration over 1 year. A portion of actinic keratoses will progress to squamous cell carcinoma of the skin. It is not possible to reliably predict which spots will progress to skin cancer and so treatment is recommended to prevent development of skin cancer.  Recommend daily broad spectrum sunscreen SPF 30+ to sun-exposed areas, reapply every 2 hours as needed.  Recommend staying in the shade or wearing long sleeves, sun glasses (UVA+UVB protection) and wide brim hats (4-inch brim around the entire circumference of the hat). Call for new or changing lesions.  Treatment Plan: - Cryo Therapy completed while in office today - Plan to follow up in 6 months to discuss  Efudex Therapy  ROSACEA Exam: Mid face erythema with telangiectasias +/- scattered inflammatory  papules  Flared  Rosacea is a chronic progressive skin condition usually affecting the face of adults, causing redness and/or acne bumps. It is treatable but not curable. It sometimes affects the eyes (ocular rosacea) as well. It may respond to topical and/or systemic medication and can flare with stress, sun exposure, alcohol, exercise, topical steroids (including hydrocortisone/cortisone 10) and some foods.  Daily application of broad spectrum spf 30+ sunscreen to face is recommended to reduce flares.  Patient denies grittiness of the eyes  Treatment Plan: - Recommended applying LRP BPO to spot treat the papule on the Nasal Wall  SKIN CANCER SCREENING PERFORMED TODAY  AK (ACTINIC KERATOSIS) (21) Left Breast (2), Left Forehead, Left Mid Helix, Left Occipital Scalp (4), Mid Forehead, Mid Frontal Scalp (2), Right Forehead, Right Mid Helix, Right Occipital Scalp (2), Right Posterior Auricle, Right Posterior Neck, Right Shoulder - Anterior, Right Temple (2), Right Upper Back Destruction of lesion - Left Breast (2), Left Forehead, Left Mid Helix, Left Occipital Scalp (4), Mid Forehead, Mid Frontal Scalp (2), Right Forehead, Right Mid Helix, Right Occipital Scalp (2), Right Posterior Auricle, Right Posterior Neck, Right Shoulder - Anterior, Right Temple (2), Right Upper Back Complexity: simple   Destruction method: cryotherapy   Informed consent: discussed and consent obtained   Timeout:  patient name, date of birth, surgical site, and procedure verified Lesion destroyed using liquid nitrogen: Yes   Cryotherapy cycles:  21 Post-procedure details: wound care instructions given   SEBORRHEIC KERATOSIS (13) Left Dorsal Hand (3), Left Lower Back, Left Upper Back, Mid Back (2), Right Dorsal Hand (3), Right Forearm - Posterior (3) Destruction of lesion - Left Dorsal Hand (3), Left Lower Back, Left Upper Back, Mid Back (2), Right Dorsal Hand (3), Right Forearm - Posterior (3) Complexity: simple    Destruction method: cryotherapy   Informed consent: discussed and consent obtained   Timeout:  patient name, date of birth, surgical site, and procedure verified Lesion destroyed using liquid nitrogen: Yes   Cryotherapy cycles:  11 Post-procedure details: wound care instructions given    Return in about 6 months (around 06/20/2024) for AK F/U.  I, Jetta Ager, am acting as Neurosurgeon for [employer], DO.  Documentation: I have reviewed the above documentation for accuracy and completeness, and I agree with the above.  Louana Roup, DO

## 2023-12-22 DIAGNOSIS — Z23 Encounter for immunization: Secondary | ICD-10-CM | POA: Diagnosis not present

## 2024-01-21 ENCOUNTER — Other Ambulatory Visit: Payer: Self-pay | Admitting: Gastroenterology

## 2024-01-21 DIAGNOSIS — K219 Gastro-esophageal reflux disease without esophagitis: Secondary | ICD-10-CM

## 2024-01-24 ENCOUNTER — Other Ambulatory Visit: Payer: Self-pay | Admitting: Thoracic Surgery (Cardiothoracic Vascular Surgery)

## 2024-01-24 DIAGNOSIS — I77810 Thoracic aortic ectasia: Secondary | ICD-10-CM

## 2024-02-12 ENCOUNTER — Encounter: Payer: Self-pay | Admitting: Adult Health

## 2024-02-12 ENCOUNTER — Ambulatory Visit (INDEPENDENT_AMBULATORY_CARE_PROVIDER_SITE_OTHER): Admitting: Physician Assistant

## 2024-02-12 ENCOUNTER — Encounter: Payer: Self-pay | Admitting: Physician Assistant

## 2024-02-12 VITALS — BP 122/71 | HR 72 | Resp 20 | Ht 70.0 in | Wt 184.0 lb

## 2024-02-12 DIAGNOSIS — F09 Unspecified mental disorder due to known physiological condition: Secondary | ICD-10-CM | POA: Diagnosis not present

## 2024-02-12 DIAGNOSIS — Z961 Presence of intraocular lens: Secondary | ICD-10-CM | POA: Diagnosis not present

## 2024-02-12 DIAGNOSIS — G3184 Mild cognitive impairment, so stated: Secondary | ICD-10-CM

## 2024-02-12 DIAGNOSIS — H4322 Crystalline deposits in vitreous body, left eye: Secondary | ICD-10-CM | POA: Diagnosis not present

## 2024-02-12 DIAGNOSIS — E119 Type 2 diabetes mellitus without complications: Secondary | ICD-10-CM | POA: Diagnosis not present

## 2024-02-12 LAB — HM DIABETES EYE EXAM

## 2024-02-12 MED ORDER — DONEPEZIL HCL 10 MG PO TABS: ORAL_TABLET | ORAL | 3 refills | Status: AC

## 2024-02-12 NOTE — Progress Notes (Signed)
 Assessment/Plan:    Amnestic mild cognitive impairment   Daniel Guerrero is a very pleasant 78 y.o. RH male with a history of migraines,  abdominal aortic aneurysm without rupture, thoracic aneurysm, chronic diarrhea from gallbladder disease, bilateral hearing loss, hypertension, B12 deficiency , and a history of amnestic MCI with concerns for Alzheimer's disease per neuropsych evaluation on April 2024  presenting today in follow-up for evaluation of memory loss.  Cognitive decline is noted.  Patient is on memantine  10 mg twice daily, tolerating well.  Since she is no longer bradycardic, we discussed initiating donepezil  10 mg daily for better coverage, he agrees to proceed.  He is still able to participate in his ADLs and to drive without difficulty.  Mood is good.  He has remained alcohol free, discontinuing it completely.   Recommendations:   Follow up in 6 months. Continue memantine  10 mg twice daily, side effects discussed Continue B1 replenishment Patient is scheduled for repeat neuropsych evaluation on October 2025 for diagnostic clarity and disease trajectory He politely declined to have another imaging performed at this time Recommend good control of cardiovascular risk factors Continue to control mood as per PCP    Subjective:   This patient is accompanied in the office by his wife who supplements the history. Previous records as well as any outside records available were reviewed prior to todays visit.   Patient was last seen on 09/18/2023 with MMSE 25/30 .    Any changes in memory since last visit? I am not getting any better, maybe a little worse-he says.  He continues to have difficulties with STM, LTM is good.  He likes to play computer games, go to [employer] .He does not like doing any brain stimulating exercises but he likes to read frequently.   repeats oneself?  Endorsed, frequently Disoriented when walking into a room?  Patient denies    Misplacing objects?   He takes garbage a out of the recycle bin .-Wife says Wandering behavior?   Denies. Any personality changes since last visit? Has moments of irritability  Any worsening depression?: denies.   Hallucinations or paranoia?  Denies.   Seizures?   Denies.    Any sleep changes? Sleeps well, occasionally waking up in the middle of the night.  He does report vivid dreams sometimes they feel very real, REM behavior or sleepwalking   Sleep apnea?   denies    Any hygiene concerns?   Denies.   Independent of bathing and dressing?  Endorsed  Does the patient needs help with medications? Patient is in charge, he prepares his medications weekly on the pillbox  Who is in charge of the finances?  Wife is in charge, she is an Airline pilot     Any changes in appetite?  Appetite is very good    Patient have trouble swallowing?  Denies.   Does the patient cook?  Yes any kitchen accidents such as leaving the stove on?   Denies.   Any headaches?    Denies.   Vision changes? Denies. Chronic pain?  Denies.   Ambulates with difficulty?    Not as active, he needs or start walking again -wife says  Recent falls or head injuries?  Denies.      Unilateral weakness, numbness or tingling?  Denies.   Any tremors?  Denies.   Any anosmia?    Denies.   Any incontinence of urine?  He reports nocturia, sees urology. Any bowel dysfunction?  Intermittent diarrhea due to  gallbladder disease.      Patient lives with his wife. Does the patient drive?  Yes, at times he has to stop and think  but denies getting lost  Alcohol?no longer drinks     Neuropsych evaluation 11/17/2022 briefly, results suggested a primary impairment surrounding all aspects of learning and memory. Additional impairments were exhibited across processing speed, executive functioning (i.e., cognitive flexibility and response inhibition), clock drawing, and semantic fluency. The etiology for ongoing cognitive impairment is unclear. With that being said, I  do have elevated concerns surrounding an underlying neurodegenerative illness, namely Alzheimer's disease. Across memory testing, [name] did not benefit from repeated learning opportunities, was fully amnestic (i.e., 0% retention) across two verbal memory measures after a brief delay and only exhibited 22% retention on a visual task, and performed very poorly across all yes/no recognition testing. Taken together, this suggests rapid forgetting and a pronounced storage impairment, both of which are hallmark characteristics of this illness. His further semantic fluency impairment is also worrisome for typical disease progression. Testing patterns align well with day-to-day reporting of [name]'s children noting that he is repetitive in conversation and commonly tells the same stories multiple times. Intact confrontation naming is encouraging and could suggest earlier disease staging if this illness is truly present. Mild microvascular ischemic disease and scattered frontoparietal microhemorrhages seen on his most recent brain MRI (2022) may exacerbate ongoing dysfunction. These findings would not explain amnestic memory performances across testing. Continued medical monitoring will be important moving forward.       Initial visit 03/02/21 the patient is seen in neurologic consultation at the request of Daniel Huxley, NP for the evaluation of memory.  The patient is accompanied by wife Daniel Guerrero who supplements the history. 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  He states that recalling names and numbers became more difficult, even numbers that he had dialed frequently.  I have been always bad with dates, so I was not too worried when that all I could not remember     His mood has been good, without depression or  irritability.  He likes to enjoy his retirement, working on his yard, walking with his wife, and using his stationary bicycle.  He still would like to go back to teaching if he could.  He falls asleep well at night, but because of frequent urination he wakes up about 3 times to go to the bathroom, which interferes with his sleep.  He has begun taking Flomax  which seems to help somehow.  He denies any vivid dreams or sleepwalking.  He denies hallucinations, paranoia, or leaving objects in unusual places.  He is independent of bathing and dressing.  Occasionally, he forgets to take a dose of medication.  He tries to set then in front of the breakfast table in a pillbox and his wife monitors closely.  She also manages the finances, she is an Airline pilot, and has been doing so forever .  His appetite is good, but when he was having worse diarrhea, had lost a significant amount of weight, using this opportunity to  eat better and stay within the 180 to 190 pound range.  He denies trouble swallowing.  He never cooks, his wife always did the cooking. He ambulates without difficulty without the use of a walker or a cane.  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  He drives and uses his GPS, without getting lost  -he depends so much of his GPS, but at times he may feel lost without it.  He denies any recent headaches -He has a history of migraines, but they are not life limiting with a frequency of about once a month.  He denies any falls, head injuries, double vision, dizziness, vertigo, focal numbness or tingling, unilateral weakness or tremors, denies anosmia, never had COVID.  He denies a history of sleep apnea, alcohol or tobacco abuse.  Family history negative for dementia.       MRI brain 02/16/21  No evidence of acute intracranial abnormality.  Mild chronic small vessel ischemic changes within the  cerebral white matter and pons, similar as compared to the brain MRI of 01/25/2018. 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  Mild generalized cerebral and cerebellar atrophy.   Mild bilateral ethmoid and maxillary sinus mucosal thickening     Past Medical History:  Diagnosis Date   Allergic rhinitis 08/01/2010   Amnestic MCI (mild cognitive impairment with memory loss) 11/17/2022   Asthma with acute exacerbation 11/09/2014   Asymmetrical sensorineural hearing loss 02/06/2018   Left Ear   Benign paroxysmal positional vertigo 04/16/2014   BPH (benign prostatic hyperplasia)    Cataract of both eyes    implants both eyes    Contact dermatitis 12/13/2007   Dysphagia 08/04/2010   Eczema    Enlarged aorta (HCC)    Erectile dysfunction    GERD (gastroesophageal reflux disease) 11/26/2018   Headache 12/19/2016   Headache, common migraine, intractable 09/09/2010   History of colonic polyps 11/26/2018   History of inguinal hernia repair, bilateral    Hyperlipidemia 04/14/2016   Hypertension    Kidney stones    Meniere disease    Paroxysmal SVT (supraventricular tachycardia) (HCC)    Psoriasis 12/13/2007   S/P appendectomy    S/P tonsillectomy    Sinus bradycardia 06/10/2015   Varicose veins of right lower extremity with complications 10/30/2016     Past Surgical History:  Procedure Laterality Date   Ablasion     APPENDECTOMY     CARDIAC ELECTROPHYSIOLOGY MAPPING AND ABLATION  2015   CHOLECYSTECTOMY     COLONOSCOPY     unsure where or when- states greater than 10 yrs ago per pt- was normal per pt and his wife    COLONOSCOPY WITH PROPOFOL  N/A 08/26/2018   two tubular adenomas,  diverticulosis.  Repeat colonoscopy in February 2025 if overall health permits.   CYSTOSCOPY WITH RETROGRADE PYELOGRAM, URETEROSCOPY AND STENT PLACEMENT Left 12/30/2014   Procedure:  CYSTOSCOPY WITH LEFT URETEROSCOPY STONE EXTRACTION WITH STENT;  Surgeon: Garnette Shack, MD;  Location: [facility];  Service: Urology;  Laterality: Left;   ELECTROPHYSIOLOGIC STUDY N/A 08/24/2015   Procedure: SVT Ablation;  Surgeon: Lynwood Rakers, MD;  Location: [facility];  Service: Cardiovascular;  Laterality: N/A;   ENDOVENOUS ABLATION SAPHENOUS VEIN W/ LASER Right 12/04/2016   endovenous laser ablation right greater saphenous vein by Lynwood Collum MD    ESOPHAGOGASTRODUODENOSCOPY N/A 08/18/2016   Procedure: ESOPHAGOGASTRODUODENOSCOPY (EGD);  Surgeon: Lamar CHRISTELLA Hollingshead, MD;  Location: [facility];  Service: Endoscopy;  Laterality: N/A;   ESOPHAGOGASTRODUODENOSCOPY N/A 09/06/2016   Procedure: ESOPHAGOGASTRODUODENOSCOPY (EGD);  Surgeon: Lamar CHRISTELLA Hollingshead, MD;  Location: [facility];  Service: Endoscopy;  Laterality: N/A;  100 - moved to 2/14 @ 2:15 per [place]   ESOPHAGOGASTRODUODENOSCOPY (EGD) WITH PROPOFOL  N/A 08/26/2018   Mild Schatzki ring status post dilation, small hiatal hernia.   EYE SURGERY     Catracts removed both eye 30 yrs ago   HERNIA REPAIR     right and left   HOLMIUM LASER APPLICATION Left 12/30/2014   Procedure: HOLMIUM LASER APPLICATION;  Surgeon: Garnette Shack, MD;  Location: [facility];  Service: Urology;  Laterality: Left;   MALONEY DILATION N/A 09/06/2016   Procedure: AGAPITO DILATION;  Surgeon: Lamar CHRISTELLA Hollingshead, MD;  Location: [facility];  Service: Endoscopy;  Laterality: N/A;   MALONEY DILATION N/A 08/26/2018   Procedure: AGAPITO DILATION;  Surgeon: Hollingshead Lamar CHRISTELLA, MD;  Location: [facility];  Service: Endoscopy;  Laterality: N/A;   POLYPECTOMY  08/26/2018   Procedure: POLYPECTOMY;  Surgeon: Hollingshead Lamar CHRISTELLA, MD;  Location: [facility];  Service: Endoscopy;;  polyp at descending colon x2   TONSILLECTOMY       PREVIOUS MEDICATIONS:   CURRENT MEDICATIONS:  Outpatient Encounter Medications as of 02/12/2024  Medication Sig   amLODipine  (NORVASC ) 10 MG  tablet Take 1 tablet (10 mg total) by mouth daily.   cetirizine (ZYRTEC) 10 MG tablet Take 10 mg by mouth daily as needed for allergies.   Cyanocobalamin  (VITAMIN B-12 PO) Take 1 tablet by mouth daily.    diphenhydrAMINE  HCl (BENADRYL  ALLERGY PO) Take 1 tablet by mouth daily.   dutasteride  (AVODART ) 0.5 MG capsule Take 1 capsule (0.5 mg total) by mouth daily.   EPINEPHrine  0.3 mg/0.3 mL IJ SOAJ injection Inject 0.3 mLs (0.3 mg total) into the muscle as needed for anaphylaxis.   fluticasone  (FLONASE ) 50 MCG/ACT nasal spray INSTILL TWO SPRAYS IN EACH NOSTRIL DAILY AS NEEDED FOR ALLERGIES   ketoconazole  (NIZORAL ) 2 % shampoo Apply 1 Application topically 2 (two) times a week.   lansoprazole  (PREVACID ) 30 MG capsule TAKE ONE CAPSULE BY MOUTH DAILY BEFORE BREAKFAST.   loperamide (IMODIUM) 2 MG capsule Take by mouth as needed for diarrhea or loose stools. Patient takes every 2-3 days.   memantine  (NAMENDA ) 10 MG tablet Take 1 tablet (10 mg)  twice a day   Simethicone  (GAS-X PO) Take 1 tablet by mouth daily as needed.    tamsulosin  (FLOMAX ) 0.4 MG CAPS capsule Take 2 capsules (0.8 mg total) by mouth daily.   triamcinolone  ointment (KENALOG ) 0.5 % Apply 1 Application topically 2 (two) times daily.   vitamin C (ASCORBIC ACID) 500 MG tablet Take 500 mg by mouth daily.   No facility-administered encounter medications on file as of 02/12/2024.     Objective:     PHYSICAL EXAMINATION:    VITALS:   Vitals:   02/12/24 1406  BP: 122/71  Pulse: 72  Resp: 20  SpO2: 98%  Weight: 184 lb (83.5 kg)  Height: 5' 10 (1.778 m)    GEN:  The patient appears stated age and is in NAD. HEENT:  Normocephalic, atraumatic.   Neurological examination:  General: NAD, well-groomed, appears stated age. Orientation: The patient is alert. Oriented to person, place and not to date. Cranial nerves: There is good facial symmetry.The speech is fluent and clear. No aphasia or dysarthria. Fund of knowledge is  appropriate. Recent memory impaired and remote memory is normal.  Attention and concentration are normal.  Able to name objects and repeat phrases.  Hearing is decreased to conversational tone bilaterally.  Sensation: Sensation is intact to light touch throughout Motor: Strength is at least antigravity x4. DTR's 2/4 in UE/LE      03/02/2021    3:00 PM  Montreal Cognitive Assessment   Visuospatial/ Executive (0/5) 4  Naming (0/3) 2  Attention: Read list of digits (0/2) 2  Attention: Read list of letters (0/1) 1  Attention: Serial 7 subtraction starting at 100 (0/3) 3  Language: Repeat phrase (0/2) 2  Language : Fluency (0/1) 1  Abstraction (0/2) 1  Delayed Recall (0/5) 0  Orientation (0/6) 4  Total 20  Adjusted Score (based on education) 20       09/18/2023    1:00 PM 09/07/2022   12:00 PM 03/07/2022   12:00 PM  MMSE - Mini Mental State Exam  Orientation to time 4 5 2   Orientation to Place 5 5 5   Registration 3 3 3   Attention/ Calculation 4 4 4   Recall 0 2 2  Language- name 2 objects 2 2 2   Language- repeat 1 1 1   Language- follow 3 step command 3 3 3   Language- read & follow direction 1 1 1   Write a sentence 1 1 0  Copy design 1 1 1   Total score 25 28 24        Movement examination: Tone: There is normal tone in the UE/LE Abnormal movements:  no tremor.  No myoclonus.  No asterixis.   Coordination:  There is no decremation with RAM's. Normal finger to nose  Gait and Station: The patient has no difficulty arising out of a deep-seated chair without the use of the hands. The patient's stride length is good.  Gait is cautious and narrow.   Thank you for allowing us  the opportunity to participate in the care of this nice patient. Please do not hesitate to contact us  for any questions or concerns.   Total time spent on today's visit was 21 minutes dedicated to this patient today, preparing to see patient, examining the patient, ordering tests and/or medications and counseling  the patient, documenting clinical information in the EHR or other health record, independently interpreting results and communicating results to the patient/family, discussing treatment and goals, answering patient's questions and coordinating care.  Cc:  Daniel Huxley, NP  Camie Sevin 02/12/2024 2:49 PM

## 2024-02-12 NOTE — Patient Instructions (Addendum)
 It was a pleasure to see you today at our office.   Recommendations:  Continue memantine  10 mg  1 tablet  twice daily.   We will start donepezil  half tablet (5mg ) daily for 2  weeks.  If you are tolerating the medication, then after 2 weeks, we will increase the dose to a full tablet of 10 mg daily.    B1 replenishment at 100 mg daily  Continue B12 Follow up in 6 months Recheck hearing to improve comprehension Repeat the neuropsychological evaluation in the near future    RECOMMENDATIONS FOR ALL PATIENTS WITH MEMORY PROBLEMS: 1. Continue to exercise (Recommend 30 minutes of walking everyday, or 3 hours every week) 2. Increase social interactions - continue going to [place] and enjoy social gatherings with friends and family 3. Eat healthy, avoid fried foods and eat more fruits and vegetables 4. Maintain adequate blood pressure, blood sugar, and blood cholesterol level. Reducing the risk of stroke and cardiovascular disease also helps promoting better memory. 5. Avoid stressful situations. Live a simple life and avoid aggravations. Organize your time and prepare for the next day in anticipation. 6. Sleep well, avoid any interruptions of sleep and avoid any distractions in the bedroom that may interfere with adequate sleep quality 7. Avoid sugar, avoid sweets as there is a strong link between excessive sugar intake, diabetes, and cognitive impairment We discussed the Mediterranean diet, which has been shown to help patients reduce the risk of progressive memory disorders and reduces cardiovascular risk. This includes eating fish, eat fruits and green leafy vegetables, nuts like almonds and hazelnuts, walnuts, and also use olive oil. Avoid fast foods and fried foods as much as possible. Avoid sweets and sugar as sugar use has been linked to worsening of memory function.  There is always a concern of gradual progression of memory problems. If this is the case, then we may need to adjust level of  care according to patient needs. Support, both to the patient and caregiver, should then be put into place.     FALL PRECAUTIONS: Be cautious when walking. Scan the area for obstacles that may increase the risk of trips and falls. When getting up in the mornings, sit up at the edge of the bed for a few minutes before getting out of bed. Consider elevating the bed at the head end to avoid drop of blood pressure when getting up. Walk always in a well-lit room (use night lights in the walls). Avoid area rugs or power cords from appliances in the middle of the walkways. Use a walker or a cane if necessary and consider physical therapy for balance exercise. Get your eyesight checked regularly.  FINANCIAL OVERSIGHT: Supervision, especially oversight when making financial decisions or transactions is also recommended.  HOME SAFETY: Consider the safety of the kitchen when operating appliances like stoves, microwave oven, and blender. Consider having supervision and share cooking responsibilities until no longer able to participate in those. Accidents with firearms and other hazards in the house should be identified and addressed as well.   ABILITY TO BE LEFT ALONE: If patient is unable to contact 911 operator, consider using LifeLine, or when the need is there, arrange for someone to stay with patients. Smoking is a fire hazard, consider supervision or cessation. Risk of wandering should be assessed by caregiver and if detected at any point, supervision and safe proof recommendations should be instituted.  MEDICATION SUPERVISION: Inability to self-administer medication needs to be constantly addressed. Implement a mechanism to ensure  safe administration of the medications.   DRIVING: Regarding driving, in patients with progressive memory problems, driving will be impaired. We advise to have someone else do the driving if trouble finding directions or if minor accidents are reported. Independent driving  assessment is available to determine safety of driving.   If you are interested in the driving assessment, you can contact the following:  [facility] [employer] [facility] in [place] [phone]  [facility] [phone]  [facility] [phone] or [phone]    Mediterranean Diet A Mediterranean diet refers to food and lifestyle choices that are based on the traditions of countries located on the [employer]. This way of eating has been shown to help prevent certain conditions and improve outcomes for people who have chronic diseases, like kidney disease and heart disease. What are tips for following this plan? Lifestyle  Cook and eat meals together with your family, when possible. Drink enough fluid to keep your urine clear or pale yellow. Be physically active every day. This includes: Aerobic exercise like running or swimming. Leisure activities like gardening, walking, or housework. Get 7-8 hours of sleep each night. If recommended by your health care provider, drink red wine in moderation. This means 1 glass a day for nonpregnant women and 2 glasses a day for men. A glass of wine equals 5 oz (150 mL). Reading food labels  Check the serving size of packaged foods. For foods such as rice and pasta, the serving size refers to the amount of cooked product, not dry. Check the total fat in packaged foods. Avoid foods that have saturated fat or trans fats. Check the ingredients list for added sugars, such as corn syrup. Shopping  At the grocery store, buy most of your food from the areas near the walls of the store. This includes: Fresh fruits and vegetables (produce). Grains, beans, nuts, and seeds. Some of these may be available in unpackaged forms or large amounts (in bulk). Fresh seafood. Poultry and eggs. Low-fat dairy products. Buy whole ingredients instead of prepackaged foods. Buy fresh fruits and vegetables  in-season from local farmers markets. Buy frozen fruits and vegetables in resealable bags. If you do not have access to quality fresh seafood, buy precooked frozen shrimp or canned fish, such as tuna, salmon, or sardines. Buy small amounts of raw or cooked vegetables, salads, or olives from the deli or salad bar at your store. Stock your pantry so you always have certain foods on hand, such as olive oil, canned tuna, canned tomatoes, rice, pasta, and beans. Cooking  Cook foods with extra-virgin olive oil instead of using butter or other vegetable oils. Have meat as a side dish, and have vegetables or grains as your main dish. This means having meat in small portions or adding small amounts of meat to foods like pasta or stew. Use beans or vegetables instead of meat in common dishes like chili or lasagna. Experiment with different cooking methods. Try roasting or broiling vegetables instead of steaming or sauteing them. Add frozen vegetables to soups, stews, pasta, or rice. Add nuts or seeds for added healthy fat at each meal. You can add these to yogurt, salads, or vegetable dishes. Marinate fish or vegetables using olive oil, lemon juice, garlic, and fresh herbs. Meal planning  Plan to eat 1 vegetarian meal one day each week. Try to work up to 2 vegetarian meals, if possible. Eat seafood 2 or more times a week. Have healthy snacks readily available, such  as: Vegetable sticks with hummus. Greek yogurt. Fruit and nut trail mix. Eat balanced meals throughout the week. This includes: Fruit: 2-3 servings a day Vegetables: 4-5 servings a day Low-fat dairy: 2 servings a day Fish, poultry, or lean meat: 1 serving a day Beans and legumes: 2 or more servings a week Nuts and seeds: 1-2 servings a day Whole grains: 6-8 servings a day Extra-virgin olive oil: 3-4 servings a day Limit red meat and sweets to only a few servings a month What are my food choices? Mediterranean  diet Recommended Grains: Whole-grain pasta. Brown rice. Bulgar wheat. Polenta. Couscous. Whole-wheat bread. [place] Madeira. Vegetables: Artichokes. Beets. Broccoli. Cabbage. Carrots. Eggplant. Green beans. Chard. Kale. Spinach. Onions. Leeks. Peas. Squash. Tomatoes. Peppers. Radishes. Fruits: Apples. Apricots. Avocado. Berries. Bananas. Cherries. Dates. Figs. Grapes. Lemons. Melon. Oranges. Peaches. Plums. Pomegranate. Meats and other protein foods: Beans. Almonds. Sunflower seeds. Pine nuts. Peanuts. Cod. Salmon. Scallops. Shrimp. Tuna. Tilapia. Clams. Oysters. Eggs. Dairy: Low-fat milk. Cheese. Greek yogurt. Beverages: Water . Red wine. Herbal tea. Fats and oils: Extra virgin olive oil. Avocado oil. Grape seed oil. Sweets and desserts: [place] yogurt with honey. Baked apples. Poached pears. Trail mix. Seasoning and other foods: Basil. Cilantro. Coriander. Cumin. Mint. Parsley. Sage. Rosemary. Tarragon. Garlic. Oregano. Thyme. Pepper. Balsalmic vinegar. Tahini. Hummus. Tomato sauce. Olives. Mushrooms. Limit these Grains: Prepackaged pasta or rice dishes. Prepackaged cereal with added sugar. Vegetables: Deep fried potatoes (french fries). Fruits: Fruit canned in syrup. Meats and other protein foods: Beef. Pork. Lamb. Poultry with skin. Hot dogs. [place]. Dairy: Ice cream. Sour cream. Whole milk. Beverages: Juice. Sugar-sweetened soft drinks. Beer. Liquor and spirits. Fats and oils: Butter. Canola oil. Vegetable oil. Beef fat (tallow). Lard. Sweets and desserts: Cookies. Cakes. Pies. Candy. Seasoning and other foods: Mayonnaise. Premade sauces and marinades. The items listed may not be a complete list. Talk with your dietitian about what dietary choices are right for you. Summary The Mediterranean diet includes both food and lifestyle choices. Eat a variety of fresh fruits and vegetables, beans, nuts, seeds, and whole grains. Limit the amount of red meat and sweets that you eat. Talk with your  health care provider about whether it is safe for you to drink red wine in moderation. This means 1 glass a day for nonpregnant women and 2 glasses a day for men. A glass of wine equals 5 oz (150 mL). This information is not intended to replace advice given to you by your health care provider. Make sure you discuss any questions you have with your health care provider. Document Released: 03/02/2016 Document Revised: 04/04/2016 Document Reviewed: 03/02/2016 Elsevier Interactive Patient Education  2017 [employer].

## 2024-03-10 ENCOUNTER — Other Ambulatory Visit: Payer: Self-pay | Admitting: Adult Health

## 2024-03-10 DIAGNOSIS — N401 Enlarged prostate with lower urinary tract symptoms: Secondary | ICD-10-CM

## 2024-03-10 NOTE — Telephone Encounter (Unsigned)
 Copied from CRM #[id number]. Topic: Clinical - Medication Refill >> Mar 10, 2024  3:44 PM Mesmerise C wrote: Medication:  tamsulosin  (FLOMAX ) 0.4 MG CAPS capsule    Has the patient contacted their pharmacy? Yes (Agent: If no, request that the patient contact the pharmacy for the refill. If patient does not wish to contact the pharmacy document the reason why and proceed with request.) (Agent: If yes, when and what did the pharmacy advise?)  This is the patient's preferred pharmacy:  [facility] - [place], [place] - [address] [place] [address] Phone: [phone] Fax: [phone]  Is this the correct pharmacy for this prescription? Yes If no, delete pharmacy and type the correct one.   Has the prescription been filled recently? No  Is the patient out of the medication? No  Has the patient been seen for an appointment in the last year OR does the patient have an upcoming appointment? Yes  Can we respond through MyChart? Yes  Agent: Please be advised that Rx refills may take up to 3 business days. We ask that you follow-up with your pharmacy.

## 2024-03-11 MED ORDER — TAMSULOSIN HCL 0.4 MG PO CAPS: 0.8000 mg | ORAL_CAPSULE | Freq: Every day | ORAL | 1 refills | Status: DC

## 2024-03-14 ENCOUNTER — Ambulatory Visit (HOSPITAL_COMMUNITY)
Admission: RE | Admit: 2024-03-14 | Discharge: 2024-03-14 | Disposition: A | Discharge status: Home / Self Care | Source: Ambulatory Visit | Attending: Thoracic Surgery (Cardiothoracic Vascular Surgery) | Admitting: Thoracic Surgery (Cardiothoracic Vascular Surgery)

## 2024-03-14 DIAGNOSIS — I771 Stricture of artery: Secondary | ICD-10-CM | POA: Diagnosis not present

## 2024-03-14 DIAGNOSIS — I77819 Aortic ectasia, unspecified site: Secondary | ICD-10-CM | POA: Diagnosis not present

## 2024-03-14 DIAGNOSIS — I77810 Thoracic aortic ectasia: Secondary | ICD-10-CM | POA: Insufficient documentation

## 2024-03-18 ENCOUNTER — Encounter: Payer: Self-pay | Admitting: Adult Health

## 2024-03-18 ENCOUNTER — Ambulatory Visit: Payer: Medicare Other | Admitting: Adult Health

## 2024-03-18 VITALS — BP 100/70 | HR 56 | Temp 97.9°F | Ht 70.0 in | Wt 180.0 lb

## 2024-03-18 DIAGNOSIS — K219 Gastro-esophageal reflux disease without esophagitis: Secondary | ICD-10-CM | POA: Diagnosis not present

## 2024-03-18 DIAGNOSIS — I10 Essential (primary) hypertension: Secondary | ICD-10-CM | POA: Diagnosis not present

## 2024-03-18 DIAGNOSIS — I714 Abdominal aortic aneurysm, without rupture, unspecified: Secondary | ICD-10-CM

## 2024-03-18 DIAGNOSIS — N401 Enlarged prostate with lower urinary tract symptoms: Secondary | ICD-10-CM

## 2024-03-18 DIAGNOSIS — F09 Unspecified mental disorder due to known physiological condition: Secondary | ICD-10-CM

## 2024-03-18 DIAGNOSIS — R351 Nocturia: Secondary | ICD-10-CM

## 2024-03-18 LAB — CBC
HCT: 42.5 % (ref 39.0–52.0)
Hemoglobin: 14.4 g/dL (ref 13.0–17.0)
MCHC: 33.8 g/dL (ref 30.0–36.0)
MCV: 95.5 fl (ref 78.0–100.0)
Platelets: 204 K/uL (ref 150.0–400.0)
RBC: 4.45 Mil/uL (ref 4.22–5.81)
RDW: 12.8 % (ref 11.5–15.5)
WBC: 5.4 K/uL (ref 4.0–10.5)

## 2024-03-18 LAB — LIPID PANEL
Cholesterol: 146 mg/dL (ref 0–200)
HDL: 39.9 mg/dL (ref >39.00)
LDL Cholesterol: 84 mg/dL (ref 0–99)
NonHDL: 105.97
Total CHOL/HDL Ratio: 4
Triglycerides: 111 mg/dL (ref 0.0–149.0)
VLDL: 22.2 mg/dL (ref 0.0–40.0)

## 2024-03-18 LAB — COMPREHENSIVE METABOLIC PANEL WITH GFR
ALT: 16 U/L (ref 0–53)
AST: 39 U/L — ABNORMAL HIGH (ref 0–37)
Albumin: 4.1 g/dL (ref 3.5–5.2)
Alkaline Phosphatase: 82 U/L (ref 39–117)
BUN: 17 mg/dL (ref 6–23)
CO2: 27 meq/L (ref 19–32)
Calcium: 8.6 mg/dL (ref 8.4–10.5)
Chloride: 106 meq/L (ref 96–112)
Creatinine, Ser: 0.99 mg/dL (ref 0.40–1.50)
GFR: 73.09 mL/min (ref >60.00)
Glucose, Bld: 98 mg/dL (ref 70–99)
Potassium: 4.4 meq/L (ref 3.5–5.1)
Sodium: 143 meq/L (ref 135–145)
Total Bilirubin: 1 mg/dL (ref 0.2–1.2)
Total Protein: 6.3 g/dL (ref 6.0–8.3)

## 2024-03-18 LAB — TSH: TSH: 1 u[IU]/mL (ref 0.35–5.50)

## 2024-03-18 LAB — PSA: PSA: 3.08 ng/mL (ref 0.10–4.00)

## 2024-03-18 MED ORDER — AMLODIPINE BESYLATE 10 MG PO TABS: 10.0000 mg | ORAL_TABLET | Freq: Every day | ORAL | 3 refills | Status: AC

## 2024-03-18 NOTE — Progress Notes (Signed)
 Subjective:    Patient ID: Daniel Guerrero, male    DOB: 1945-09-05, 78 y.o.   MRN: [id number]  HPI Patient presents for yearly preventative medicine examination. He is a pleasant 78 year old male who  has a past medical history of Allergic rhinitis (08/01/2010), Amnestic MCI (mild cognitive impairment with memory loss) (11/17/2022), Asthma with acute exacerbation (11/09/2014), Asymmetrical sensorineural hearing loss (02/06/2018), Benign paroxysmal positional vertigo (04/16/2014), BPH (benign prostatic hyperplasia), Cataract of both eyes, Contact dermatitis (12/13/2007), Dysphagia (08/04/2010), Eczema, Enlarged aorta (HCC), Erectile dysfunction, GERD (gastroesophageal reflux disease) (11/26/2018), Headache (12/19/2016), Headache, common migraine, intractable (09/09/2010), History of colonic polyps (11/26/2018), History of inguinal hernia repair, bilateral, Hyperlipidemia (04/14/2016), Hypertension, Kidney stones, Meniere disease, Paroxysmal SVT (supraventricular tachycardia) (HCC), Psoriasis (12/13/2007), S/P appendectomy, S/P tonsillectomy, Sinus bradycardia (06/10/2015), and Varicose veins of right lower extremity with complications (10/30/2016).  Essential hypertension-managed with Norvasc  5 mg daily.  He denies chest pain, shortness of breath, dizziness, lightheadedness, or syncopal episodes. He has been out of his blood pressure medication for the last few weeks.  BP Readings from Last 3 Encounters:  03/18/24 100/70  02/12/24 122/71  12/19/23 (!) 145/86   BPH with nocturia-managed with Flomax  0.48mg    GERD-managed with Prevacid  30 mg daily  History of AAA-followed by Cardiothoracic Surgery.  CT of the abdomen was done in August 2025 which showed that the aortic root was not well visualized or evaluated due to cardiac motion and lack on IV contrast.    Mild Cognitive Impairment-managed by neurology.  Was last seen a month ago in July 2025. His last MMSE in 08/2023 was 25/30.   He continues  to be on Namenda  10 mg twice a day and donepezil  10 mg ( he decreased to 5 mg daily due to agitation and excitement with the 10 mg dose)  He is not getting lost while driving. Continues to do his ADL's. Dates and times are difficult for him. He feels as though he is stable. Does not have any issues with long term memory.   All immunizations and health maintenance protocols were reviewed with the patient and needed orders were placed.  Appropriate screening laboratory values were ordered for the patient including screening of hyperlipidemia, renal function and hepatic function. If indicated by BPH, a PSA was ordered.  Medication reconciliation,  past medical history, social history, problem list and allergies were reviewed in detail with the patient  Goals were established with regard to weight loss, exercise, and  diet in compliance with medications Wt Readings from Last 3 Encounters:  03/18/24 180 lb (81.6 kg)  02/12/24 184 lb (83.5 kg)  10/03/23 186 lb 8 oz (84.6 kg)   He has no acute issues today   Review of Systems  Constitutional: Negative.   HENT: Negative.    Eyes: Negative.   Respiratory: Negative.    Cardiovascular: Negative.   Gastrointestinal: Negative.   Endocrine: Negative.   Genitourinary: Negative.   Musculoskeletal: Negative.   Skin: Negative.   Allergic/Immunologic: Negative.   Neurological: Negative.   Hematological: Negative.   Psychiatric/Behavioral: Negative.    All other systems reviewed and are negative.  Past Medical History:  Diagnosis Date   Allergic rhinitis 08/01/2010   Amnestic MCI (mild cognitive impairment with memory loss) 11/17/2022   Asthma with acute exacerbation 11/09/2014   Asymmetrical sensorineural hearing loss 02/06/2018   Left Ear   Benign paroxysmal positional vertigo 04/16/2014   BPH (benign prostatic hyperplasia)    Cataract of both eyes  implants both eyes    Contact dermatitis 12/13/2007   Dysphagia 08/04/2010   Eczema     Enlarged aorta (HCC)    Erectile dysfunction    GERD (gastroesophageal reflux disease) 11/26/2018   Headache 12/19/2016   Headache, common migraine, intractable 09/09/2010   History of colonic polyps 11/26/2018   History of inguinal hernia repair, bilateral    Hyperlipidemia 04/14/2016   Hypertension    Kidney stones    Meniere disease    Paroxysmal SVT (supraventricular tachycardia) (HCC)    Psoriasis 12/13/2007   S/P appendectomy    S/P tonsillectomy    Sinus bradycardia 06/10/2015   Varicose veins of right lower extremity with complications 10/30/2016    Social History   Socioeconomic History   Marital status: Married    Spouse name: Not on file   Number of children: 3   Years of education: 16   Highest education level: Bachelor's degree (e.g., BA, AB, BS)  Occupational History   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  Tobacco Use   Smoking status: Former    Current packs/day: 0.00    Types: Cigarettes    Quit date: 07/24/1964    Years since quitting: 59.6   Smokeless tobacco: Never   Tobacco comments:    Quit 20-30 years ago; smoked pipe and cigars   Vaping Use   Vaping status: Never Used  Substance and Sexual Activity   Alcohol use: Yes    Alcohol/week: 2.0 standard drinks of alcohol    Types: 2 Cans of beer per week    Comment: 2 per day   Drug use: No   Sexual activity: Not on file  Other Topics Concern   Not on file  Social History Narrative   Daily Caffeine use: 5-6 drinks daily      08/07/2018: No drug use, some daily alcohol use at night but patient does not feel like it disrupts his health or is a safety concern.       Lives with wife in two story home; three sons, five grandchildren in [place] area.   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    Right handed            Social Drivers of Health   Financial Resource Strain: Low Risk  (10/03/2023)   Overall Financial  Resource Strain (CARDIA)    Difficulty of Paying Living Expenses: Not hard at all  Food Insecurity: No Food Insecurity (10/03/2023)   Hunger Vital Sign    Worried About Running Out of Food in the Last Year: Never true    Ran Out of Food in the Last Year: Never true  Transportation Needs: No Transportation Needs (10/03/2023)   PRAPARE - Administrator, Civil Service (Medical): No    Lack of Transportation (Non-Medical): No  Physical Activity: Insufficiently Active (10/03/2023)   Exercise Vital Sign    Days of Exercise per Week: 2 days    Minutes of Exercise per Session: 30 min  Stress: No Stress Concern Present (10/03/2023)   [name]    Feeling of Stress : Not at all  Social Connections: Socially Integrated (10/03/2023)   Social Connection and Isolation Panel    Frequency of Communication with Friends and Family: More than three times a week    Frequency of Social Gatherings with Friends and Family: More than three times a week  Attends Religious Services: More than 4 times per year    Active Member of Clubs or Organizations: Yes    Attends Banker Meetings: More than 4 times per year    Marital Status: Married  Catering manager Violence: Not At Risk (10/03/2023)   Humiliation, Afraid, Rape, and Kick questionnaire    Fear of Current or Ex-Partner: No    Emotionally Abused: No    Physically Abused: No    Sexually Abused: No    Past Surgical History:  Procedure Laterality Date   Ablasion     APPENDECTOMY     CARDIAC ELECTROPHYSIOLOGY MAPPING AND ABLATION  2015   CHOLECYSTECTOMY     COLONOSCOPY     unsure where or when- states greater than 10 yrs ago per pt- was normal per pt and his wife    COLONOSCOPY WITH PROPOFOL  N/A 08/26/2018   two tubular adenomas,  diverticulosis.  Repeat colonoscopy in February 2025 if overall health permits.   CYSTOSCOPY WITH RETROGRADE PYELOGRAM, URETEROSCOPY AND  STENT PLACEMENT Left 12/30/2014   Procedure: CYSTOSCOPY WITH LEFT URETEROSCOPY STONE EXTRACTION WITH STENT;  Surgeon: Garnette Shack, MD;  Location: [facility];  Service: Urology;  Laterality: Left;   ELECTROPHYSIOLOGIC STUDY N/A 08/24/2015   Procedure: SVT Ablation;  Surgeon: Lynwood Rakers, MD;  Location: [facility] INVASIVE CV LAB;  Service: Cardiovascular;  Laterality: N/A;   ENDOVENOUS ABLATION SAPHENOUS VEIN W/ LASER Right 12/04/2016   endovenous laser ablation right greater saphenous vein by Lynwood Collum MD    ESOPHAGOGASTRODUODENOSCOPY N/A 08/18/2016   Procedure: ESOPHAGOGASTRODUODENOSCOPY (EGD);  Surgeon: Lamar CHRISTELLA Hollingshead, MD;  Location: [facility];  Service: Endoscopy;  Laterality: N/A;   ESOPHAGOGASTRODUODENOSCOPY N/A 09/06/2016   Procedure: ESOPHAGOGASTRODUODENOSCOPY (EGD);  Surgeon: Lamar CHRISTELLA Hollingshead, MD;  Location: [facility];  Service: Endoscopy;  Laterality: N/A;  100 - moved to 2/14 @ 2:15 per [place]   ESOPHAGOGASTRODUODENOSCOPY (EGD) WITH PROPOFOL  N/A 08/26/2018   Mild Schatzki ring status post dilation, small hiatal hernia.   EYE SURGERY     Catracts removed both eye 30 yrs ago   HERNIA REPAIR     right and left   HOLMIUM LASER APPLICATION Left 12/30/2014   Procedure: HOLMIUM LASER APPLICATION;  Surgeon: Garnette Shack, MD;  Location: [facility];  Service: Urology;  Laterality: Left;   MALONEY DILATION N/A 09/06/2016   Procedure: AGAPITO DILATION;  Surgeon: Lamar CHRISTELLA Hollingshead, MD;  Location: [facility];  Service: Endoscopy;  Laterality: N/A;   MALONEY DILATION N/A 08/26/2018   Procedure: AGAPITO DILATION;  Surgeon: Hollingshead Lamar CHRISTELLA, MD;  Location: [facility];  Service: Endoscopy;  Laterality: N/A;   POLYPECTOMY  08/26/2018   Procedure: POLYPECTOMY;  Surgeon: Hollingshead Lamar CHRISTELLA, MD;  Location: [facility];  Service: Endoscopy;;  polyp at descending colon x2   TONSILLECTOMY      Family History  Problem Relation Age of Onset   Cancer Mother        lung   Thyroid  disease Mother     Diabetes Father    GI Bleed Father    Aneurysm Father    AAA (abdominal aortic aneurysm) Father    AAA (abdominal aortic aneurysm) Brother    Colon cancer Neg Hx    Colon polyps Neg Hx    Rectal cancer Neg Hx    Stomach cancer Neg Hx     Allergies  Allergen Reactions   Peanut-Containing Drug Products Hives   Penicillins Hives    Has patient had a PCN  reaction causing immediate rash, facial/tongue/throat swelling, SOB or lightheadedness with hypotension: No Has patient had a PCN reaction causing severe rash involving mucus membranes or skin necrosis: No Has patient had a PCN reaction that required hospitalization No Has patient had a PCN reaction occurring within the last 10 years: No If all of the above answers are NO, then may proceed with Cephalosporin use.   Quinolones     Patient was warned about not using Cipro  and similar antibiotics. Recent studies have raised concern that fluoroquinolone antibiotics could be associated with an increased risk of aortic aneurysm Fluoroquinolones have non-antimicrobial properties that might jeopardise the integrity of the extracellular matrix of the vascular wall In a  propensity score matched cohort study in [place], there was a 66% increased rate of aortic aneurysm or dissection associated with oral fluoroquinolone use, compared wit    Current Outpatient Medications on File Prior to Visit  Medication Sig Dispense Refill   amLODipine  (NORVASC ) 10 MG tablet Take 1 tablet (10 mg total) by mouth daily. 90 tablet 3   cetirizine (ZYRTEC) 10 MG tablet Take 10 mg by mouth daily as needed for allergies.     Cyanocobalamin  (VITAMIN B-12 PO) Take 1 tablet by mouth daily.      diphenhydrAMINE  HCl (BENADRYL  ALLERGY PO) Take 1 tablet by mouth daily.     donepezil  (ARICEPT ) 10 MG tablet Take half tablet (5 mg) daily for 2 weeks, then increase to the full tablet at 10 mg daily 90 tablet 3   dutasteride  (AVODART ) 0.5 MG capsule Take 1 capsule (0.5 mg  total) by mouth daily. 90 capsule 3   EPINEPHrine  0.3 mg/0.3 mL IJ SOAJ injection Inject 0.3 mLs (0.3 mg total) into the muscle as needed for anaphylaxis. 1 each 1   fluticasone  (FLONASE ) 50 MCG/ACT nasal spray INSTILL TWO SPRAYS IN EACH NOSTRIL DAILY AS NEEDED FOR ALLERGIES 48 g 1   ketoconazole  (NIZORAL ) 2 % shampoo Apply 1 Application topically 2 (two) times a week. 120 mL 0   lansoprazole  (PREVACID ) 30 MG capsule TAKE ONE CAPSULE BY MOUTH DAILY BEFORE BREAKFAST. 90 capsule 3   loperamide (IMODIUM) 2 MG capsule Take by mouth as needed for diarrhea or loose stools. Patient takes every 2-3 days.     memantine  (NAMENDA ) 10 MG tablet Take 1 tablet (10 mg)  twice a day 180 tablet 3   Simethicone  (GAS-X PO) Take 1 tablet by mouth daily as needed.      tamsulosin  (FLOMAX ) 0.4 MG CAPS capsule Take 2 capsules (0.8 mg total) by mouth daily. 90 capsule 1   triamcinolone  ointment (KENALOG ) 0.5 % Apply 1 Application topically 2 (two) times daily. 45 g 1   vitamin C (ASCORBIC ACID) 500 MG tablet Take 500 mg by mouth daily.     No current facility-administered medications on file prior to visit.    BP 100/70   Pulse (!) 56   Temp 97.9 F (36.6 C) (Oral)   Ht 5' 10 (1.778 m)   Wt 180 lb (81.6 kg)   SpO2 97%   BMI 25.83 kg/m       Objective:   Physical Exam Vitals and nursing note reviewed.  Constitutional:      General: He is not in acute distress.    Appearance: Normal appearance. He is not ill-appearing.  HENT:     Head: Normocephalic and atraumatic.     Right Ear: Tympanic membrane, ear canal and external ear normal. There is no impacted cerumen.  Left Ear: Tympanic membrane, ear canal and external ear normal. There is no impacted cerumen.     Nose: Nose normal. No congestion or rhinorrhea.     Mouth/Throat:     Mouth: Mucous membranes are moist.     Pharynx: Oropharynx is clear.  Eyes:     Extraocular Movements: Extraocular movements intact.     Conjunctiva/sclera: Conjunctivae  normal.     Pupils: Pupils are equal, round, and reactive to light.  Neck:     Vascular: No carotid bruit.  Cardiovascular:     Rate and Rhythm: Normal rate and regular rhythm.     Pulses: Normal pulses.     Heart sounds: No murmur heard.    No friction rub. No gallop.  Pulmonary:     Effort: Pulmonary effort is normal.     Breath sounds: Normal breath sounds.  Abdominal:     General: Abdomen is flat. Bowel sounds are normal. There is no distension.     Palpations: Abdomen is soft. There is no mass.     Tenderness: There is no abdominal tenderness. There is no guarding or rebound.     Hernia: No hernia is present.  Musculoskeletal:        General: Normal range of motion.     Cervical back: Normal range of motion and neck supple.  Lymphadenopathy:     Cervical: No cervical adenopathy.  Skin:    General: Skin is warm and dry.     Capillary Refill: Capillary refill takes less than 2 seconds.  Neurological:     General: No focal deficit present.     Mental Status: He is alert and oriented to person, place, and time.  Psychiatric:        Mood and Affect: Mood normal.        Behavior: Behavior normal.        Thought Content: Thought content normal.        Judgment: Judgment normal.        Assessment & Plan:  1. Essential hypertension (Primary)  - amLODipine  (NORVASC ) 10 MG tablet; Take 1 tablet (10 mg total) by mouth daily.  Dispense: 90 tablet; Refill: 3 - Lipid panel; Future - TSH; Future - CBC; Future - Comprehensive metabolic panel with GFR; Future  2. Benign prostatic hyperplasia with nocturia - Continue with Flomax   - PSA; Future  3. Gastroesophageal reflux disease, unspecified whether esophagitis present - Continue PPI  - Lipid panel; Future - TSH; Future - CBC; Future - Comprehensive metabolic panel with GFR; Future  4. Abdominal aortic aneurysm (AAA) without rupture, unspecified part [facility]) - Per Cardiothoracic surgery  - Lipid panel; Future - TSH;  Future - CBC; Future - Comprehensive metabolic panel with GFR; Future  5. Mild cognitive disorder - Continue with Aricept  and Namenda .  - Follow up with Neurology as directed  - Lipid panel; Future - TSH; Future - CBC; Future - Comprehensive metabolic panel with GFR; Future  Darleene Shape, NP

## 2024-03-18 NOTE — Patient Instructions (Signed)
 It was great seeing you today   We will follow up with you regarding your lab work   Please let me know if you need anything

## 2024-03-19 ENCOUNTER — Ambulatory Visit: Payer: Self-pay | Admitting: Adult Health

## 2024-03-19 ENCOUNTER — Ambulatory Visit: Payer: Medicare Other | Admitting: Physician Assistant

## 2024-03-21 ENCOUNTER — Ambulatory Visit: Admitting: Thoracic Surgery (Cardiothoracic Vascular Surgery)

## 2024-03-25 ENCOUNTER — Ambulatory Visit

## 2024-03-25 VITALS — BP 132/75 | HR 58 | Resp 20 | Ht 70.0 in | Wt 182.9 lb

## 2024-03-25 DIAGNOSIS — I7789 Other specified disorders of arteries and arterioles: Secondary | ICD-10-CM | POA: Diagnosis not present

## 2024-03-25 DIAGNOSIS — I77810 Thoracic aortic ectasia: Secondary | ICD-10-CM | POA: Diagnosis not present

## 2024-03-25 NOTE — Patient Instructions (Signed)

## 2024-03-25 NOTE — Progress Notes (Signed)
 [address] [place] [address]             [id number]            DENNEY SHEIN [id number] 10-23-1945   History of Present Illness:  [name] is a 78 year old man with medical history of hypertension, sinus bradycardia, varicose veins, GERD, mild cognitive impairment with memory loss, BPH and hyperlipidemia who presents for continued follow-up of aortic root dilation. This was initially found in 2017 on echocardiogram.  Echo showed tricuspid aortic valve.  He had CT of chest without contrast on 03/14/2024 which measured aortic root at 3.8 cm.  This study was unable to adequately measure the aortic root due to cardiac/motion artifact.  The aortic root dilation has been stable at 4.3 cm over the past years.  He presents today to the clinic with his wife.  He reports that he has been doing well.  He has control of his blood pressure with current medication therapy.  He does exercise but does not do any heavy lifting.  He does have a cough and post nasal drip but states that this is due to allergies. Denies chest pain and shortness of breath.     Current Outpatient Medications on File Prior to Visit  Medication Sig Dispense Refill   amLODipine  (NORVASC ) 10 MG tablet Take 1 tablet (10 mg total) by mouth daily. 90 tablet 3   cetirizine (ZYRTEC) 10 MG tablet Take 10 mg by mouth daily as needed for allergies.     Cyanocobalamin  (VITAMIN B-12 PO) Take 1 tablet by mouth daily.      diphenhydrAMINE  HCl (BENADRYL  ALLERGY PO) Take 1 tablet by mouth daily.     donepezil  (ARICEPT ) 10 MG tablet Take half tablet (5 mg) daily for 2 weeks, then increase to the full tablet at 10 mg daily 90 tablet 3   dutasteride  (AVODART ) 0.5 MG capsule Take 1 capsule (0.5 mg total) by mouth daily. 90 capsule 3   EPINEPHrine  0.3 mg/0.3 mL IJ SOAJ injection Inject 0.3 mLs (0.3 mg total) into the muscle as needed for anaphylaxis. 1 each 1   fluticasone  (FLONASE ) 50 MCG/ACT nasal  spray INSTILL TWO SPRAYS IN EACH NOSTRIL DAILY AS NEEDED FOR ALLERGIES 48 g 1   ketoconazole  (NIZORAL ) 2 % shampoo Apply 1 Application topically 2 (two) times a week. 120 mL 0   lansoprazole  (PREVACID ) 30 MG capsule TAKE ONE CAPSULE BY MOUTH DAILY BEFORE BREAKFAST. 90 capsule 3   loperamide (IMODIUM) 2 MG capsule Take by mouth as needed for diarrhea or loose stools. Patient takes every 2-3 days.     memantine  (NAMENDA ) 10 MG tablet Take 1 tablet (10 mg)  twice a day 180 tablet 3   Simethicone  (GAS-X PO) Take 1 tablet by mouth daily as needed.      tamsulosin  (FLOMAX ) 0.4 MG CAPS capsule Take 2 capsules (0.8 mg total) by mouth daily. 90 capsule 1   triamcinolone  ointment (KENALOG ) 0.5 % Apply 1 Application topically 2 (two) times daily. 45 g 1   vitamin C (ASCORBIC ACID) 500 MG tablet Take 500 mg by mouth daily.     No current facility-administered medications on file prior to visit.     ROS: Review of Systems  Constitutional: Negative.  Negative for fever and malaise/fatigue.  HENT:  Positive for congestion.   Respiratory:  Positive for cough. Negative for shortness of breath and wheezing.  Cardiovascular: Negative.  Negative for chest pain and leg swelling.     BP 132/75 (BP Location: Right Arm, Patient Position: Sitting, Cuff Size: Normal)   Pulse (!) 58   Resp 20   Ht 5' 10 (1.778 m)   Wt 182 lb 14.4 oz (83 kg)   SpO2 98% Comment: RA  BMI 26.24 kg/m   Physical Exam Constitutional:      Appearance: Normal appearance.  HENT:     Head: Normocephalic and atraumatic.  Cardiovascular:     Rate and Rhythm: Normal rate and regular rhythm.     Heart sounds: Normal heart sounds, S1 normal and S2 normal.  Skin:    General: Skin is warm and dry.  Neurological:     General: No focal deficit present.     Mental Status: He is alert and oriented to person, place, and time.      Imaging: CLINICAL DATA:  aortic root dilation   EXAM: CT CHEST WITHOUT CONTRAST    TECHNIQUE: Multidetector CT imaging of the chest was performed following the standard protocol without IV contrast.   RADIATION DOSE REDUCTION: This exam was performed according to the departmental dose-optimization program which includes automated exposure control, adjustment of the mA and/or kV according to patient size and/or use of iterative reconstruction technique.   COMPARISON:  March 23, 2023, September 21, 2022, April 14, 2021, October 16, 2019   FINDINGS: Cardiovascular: No cardiomegaly or pericardial effusion. The aortic root is not well evaluated due to cardiac motion in the lack of intravenous contrast. Otherwise, the remaining portions of the aorta are tortuous, but not dilated. Extensive multi-vessel coronary atherosclerosis.   Mediastinum/Nodes: No mediastinal mass.No mediastinal, hilar, or axillary lymphadenopathy.   Lungs/Pleura: The midline trachea and bronchi are patent. No focal airspace consolidation, pleural effusion, or pneumothorax.   Musculoskeletal: No acute fracture or destructive bone lesion. A couple of healed right-sided rib fractures present in the lower thorax. Multilevel degenerative disc disease of the spine. Thoracic DISH.   Upper Abdomen: No acute abnormality in the partially visualized upper abdomen. Partially visualized cyst in the left kidney. Small nonobstructive left-sided calculi.   IMPRESSION: 1. The aortic root is not well visualized or evaluated due to cardiac motion and the lack of intravenous contrast. Otherwise, the remaining portions of the thoracic aorta are tortuous, but not dilated. 2. No pneumonia, pulmonary edema, or pleural effusion.   Aortic Atherosclerosis (ICD10-I70.0).     Electronically Signed   By: Rogelia Myers M.D.   On: 03/14/2024 15:00     A/P: Aortic root dilatation (HCC) -3.8 cm aortic root dilatation on CT of chest without contrast.  On previous CTA of chest aortic dilatation has measured  4.3 cm which is stable in size. Echocardiogram showed a tricuspid aortic valve. We discussed the natural history and and risk factors for growth of aortic root dilatation. Discussed recommendations to minimize the risk of further expansion or dissection including careful blood pressure control, avoidance of contact sports and heavy lifting, attention to lipid management.  We covered the importance of continued smoking cessation  The patient does not yet meet surgical criteria of >5.5cm. The patient is aware of signs and symptoms of aortic dissection and when to present to the emergency department     -Follow up in one year with CTA of chest for continued surveillance    Risk Modification:  Statin:  not currently prescribed  Smoking cessation instruction/counseling given:  commended patient for quitting and reviewed strategies for preventing  relapses  Patient was counseled on importance of Blood Pressure Control  They are instructed to contact their Primary Care Physician if they start to have blood pressure readings over 130s/90s. Do not ever stop blood pressure medications on your own, unless instructed by healthcare professional.  Please avoid use of Fluoroquinolones as this can potentially increase your risk of Aortic Rupture and/or Dissection  Patient educated on signs and symptoms of Aortic Dissection, handout also provided in AVS  Manuelita CHRISTELLA Rough, PA-C 03/25/24

## 2024-04-08 DIAGNOSIS — Z23 Encounter for immunization: Secondary | ICD-10-CM | POA: Diagnosis not present

## 2024-05-06 ENCOUNTER — Encounter: Payer: Self-pay | Admitting: Psychology

## 2024-05-06 ENCOUNTER — Ambulatory Visit: Payer: Medicare Other | Admitting: Psychology

## 2024-05-06 ENCOUNTER — Ambulatory Visit: Payer: Self-pay | Admitting: Psychology

## 2024-05-06 DIAGNOSIS — G309 Alzheimer's disease, unspecified: Secondary | ICD-10-CM | POA: Diagnosis not present

## 2024-05-06 DIAGNOSIS — F0670 Mild neurocognitive disorder due to known physiological condition without behavioral disturbance: Secondary | ICD-10-CM | POA: Diagnosis not present

## 2024-05-06 DIAGNOSIS — R4189 Other symptoms and signs involving cognitive functions and awareness: Secondary | ICD-10-CM

## 2024-05-06 NOTE — Progress Notes (Signed)
   Psychometrician Note   Cognitive testing was administered to Daniel Guerrero by Lonell Jude, B.S. (psychometrist) under the supervision of Dr. Arthea KYM Guerrero, Ph.D., ABPP, licensed psychologist on 05/06/2024. Daniel Guerrero did not appear overtly distressed by the testing session per behavioral observation or responses across self-report questionnaires. Rest breaks were offered.   The battery of tests administered was selected by Dr. Arthea KYM Guerrero, Ph.D., ABPP with consideration to Daniel Guerrero's current level of functioning, the nature of his symptoms, emotional and behavioral responses during interview, level of literacy, observed level of motivation/effort, and the nature of the referral question. This battery was communicated to the psychometrist. Communication between Dr. Arthea KYM Guerrero, Ph.D., ABPP and the psychometrist was ongoing throughout the evaluation and Dr. Arthea KYM Guerrero, Ph.D., ABPP was immediately accessible at all times. Dr. Zachary C. Merz, Ph.D., ABPP provided supervision to the psychometrist on the date of this service to the extent necessary to assure the quality of all services provided.    Daniel Guerrero will return within approximately 1-2 weeks for an interactive feedback session with Dr. Maryland at which time his test performances, clinical impressions, and treatment recommendations will be reviewed in detail. Daniel Guerrero understands he can contact our office should he require our assistance before this time.  A total of 125 minutes of billable time were spent face-to-face with Daniel Guerrero by the psychometrist. This includes both test administration and scoring time. Billing for these services is reflected in the clinical report generated by Dr. Arthea KYM Guerrero, Ph.D., ABPP  This note reflects time spent with the psychometrician and does not include test scores or any clinical interpretations made by Dr. Maryland. The full report will follow in a separate note.

## 2024-05-06 NOTE — Progress Notes (Unsigned)
 NEUROPSYCHOLOGICAL EVALUATION [place] Department of Neurology  Date of Evaluation: May 06, 2024  Reason for Referral:   IZAYA NETHERTON is a 78 y.o. 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   Assessment and Plan:   Clinical Impression(s): Mr. Brawley's pattern of performance is suggestive of significant impairment surrounding all aspects of learning and memory. Performance variability was exhibited across processing speed, complex attention, and executive functioning. An isolated impairment was also exhibited across a figure drawing task; however, other visuospatial tasks were appropriate. Performances were also appropriate relative to age-matched peers across basic attention, receptive language, and expressive language. Functionally, Mr. Justiniano largely denied difficulties completing instrumental activities of daily living (ADLs) independently. His wife was in agreement. As such, given evidence for cognitive dysfunction described above, he continues to best meet diagnostic criteria for a Mild Neurocognitive Disorder (mild cognitive impairment) at the present time.  Relative to his previous evaluation in April 2024, his greatest area of decline surrounded his copy of a complex figure. Additional more mild declines were exhibited across attention/concentration, encoding (i.e., learning) aspects of verbal memory, and delayed retrieval aspects of visual memory. Outside of these domains, Mr. Colee exhibited a fair degree of stability.   Regarding the underlying etiology for ongoing memory impairment, concerns surrounding an underlying neurodegenerative illness such as Alzheimer's disease remain.  Across memory testing, Mr. Motton did not benefit from repeated learning opportunities, was fully amnestic (i.e., 0% retention) across all memory tasks after a brief delay, and performed poorly across follow-up recognition testing. Taken together, this continues to suggest rapid forgetting and a pronounced storage impairment, both of which are hallmark characteristics of this illness. Testing patterns align with day-to-day reporting of Mr. Goetsch's children noting that he is repetitive in conversation and commonly tells the same stories multiple times, as well as his wife's concerns surrounding progressive memory decline over time. He is still able to demonstrate some intact expressive language abilities, which is encouraging and could suggest a slowed disease progression. Continued medical monitoring will be important moving forward.   Recommendations: A repeat neuropsychological evaluation in 12-24 months could be considered to continue assessing the trajectory of future cognitive decline.  Mr. Fleener has already been prescribed a medication aimed to address memory loss and concerns surrounding Alzheimer's disease (i.e., donepezil /Aricept  and memantine /Namenda ). He is encouraged to continue taking these medications as prescribed. It is important to highlight that these medications have been shown to slow functional decline in some individuals. There is no current treatment which can stop or reverse cognitive decline when caused by a neurodegenerative illness.   Performance across neurocognitive testing is not a strong predictor of an individual's safety operating a motor vehicle. Should his family wish to pursue a formalized driving evaluation, they could reach out to the following agencies: [employer] in [place]: [phone] [facility]: [phone] [facility]: [phone] [facility]: [phone] or [phone]  Should there be  progression of current deficits over time, Mr. Reifsteck is unlikely to regain any independent living skills lost. Therefore, it is recommended that he remain as involved as possible in all aspects of household chores, finances, and medication management, with supervision to ensure adequate performance. He will likely benefit from the establishment and maintenance of a routine in order  to maximize his functional abilities over time.  It will be important for Mr. Kintz to have another person with him when in situations where he may need to process information, weigh the pros and cons of different options, and make decisions, in order to ensure that he fully understands and recalls all information to be considered.  If not already done, Mr. Tiemann and his family may want to discuss his wishes regarding durable power of attorney and medical decision making, so that he can have input into these choices. If they require legal assistance with this, long-term care resource access, or other aspects of estate planning, they could reach out to [employer] at [phone] for a free consultation. Additionally, they may wish to discuss future plans for caretaking and seek out community options for in home/residential care should they become necessary.  Mr. Theil is encouraged to attend to lifestyle factors for brain health (e.g., regular physical exercise, good nutrition habits and consideration of the MIND-DASH diet, regular participation in cognitively-stimulating activities, and general stress management techniques), which are likely to have benefits for both emotional adjustment and cognition. Optimal control of vascular risk factors (including safe cardiovascular exercise and adherence to dietary recommendations) is encouraged. Continued participation in activities which provide mental stimulation and social interaction is also recommended.   Important information should be provided to Mr. Maggart in  written format in all instances. This information should be placed in a highly frequented and easily visible location within his home to promote recall. External strategies such as written notes in a consistently used memory journal, visual and nonverbal auditory cues such as a calendar on the refrigerator or appointments with alarm, such as on a cell phone, can also help maximize recall.  To address problems with processing speed, he may wish to consider:   -Ensuring that he is alerted when essential material or instructions are being presented   -Adjusting the speed at which new information is presented   -Allowing for more time in comprehending, processing, and responding in conversation   -Repeating and paraphrasing instructions or conversations aloud  To address problems with fluctuating attention and/or executive dysfunction, he may wish to consider:   -Avoiding external distractions when needing to concentrate   -Limiting exposure to fast paced environments with multiple sensory demands   -Writing down complicated information and using checklists   -Attempting and completing one task at a time (i.e., no multi-tasking)   -Verbalizing aloud each step of a task to maintain focus   -Taking frequent breaks during the completion of steps/tasks to avoid fatigue   -Reducing the amount of information considered at one time   -Scheduling more difficult activities for a time of day where he is usually most alert  Review of Records:   Mr. Stahl completed a comprehensive neuropsychological evaluation with myself on 11/17/2022. Results suggested a primary impairment surrounding all aspects of learning and memory. Additional impairments were exhibited across processing speed, executive functioning (i.e., cognitive flexibility and response inhibition), clock drawing, and semantic fluency. He was ultimately diagnosed with a mild neurocognitive disorder. Concerns for underlying Alzheimer's disease were  expressed. Repeat testing was recommended.   Past Medical History:  Diagnosis Date   Allergic rhinitis 08/01/2010   Asthma with acute exacerbation 11/09/2014   Asymmetrical sensorineural hearing loss 02/06/2018   Left Ear   Benign paroxysmal positional vertigo 04/16/2014   BPH (benign prostatic hyperplasia)    Cataract of both eyes    implants both eyes  Contact dermatitis 12/13/2007   Dysphagia 08/04/2010   Eczema    Enlarged aorta    Erectile dysfunction    GERD (gastroesophageal reflux disease) 11/26/2018   Headache 12/19/2016   Headache, common migraine, intractable 09/09/2010   History of colonic polyps 11/26/2018   History of inguinal hernia repair, bilateral    Hyperlipidemia 04/14/2016   Hypertension    Kidney stones    Meniere disease    Mild neurocognitive disorder with concerns for underlying Alzheimer's disease 11/17/2022   Paroxysmal SVT (supraventricular tachycardia)    Psoriasis 12/13/2007   S/P appendectomy    S/P tonsillectomy    Sinus bradycardia 06/10/2015   Varicose veins of right lower extremity with complications 10/30/2016    Past Surgical History:  Procedure Laterality Date   Ablasion     APPENDECTOMY     CARDIAC ELECTROPHYSIOLOGY MAPPING AND ABLATION  2015   CHOLECYSTECTOMY     COLONOSCOPY     unsure where or when- states greater than 10 yrs ago per pt- was normal per pt and his wife    COLONOSCOPY WITH PROPOFOL  N/A 08/26/2018   two tubular adenomas,  diverticulosis.  Repeat colonoscopy in February 2025 if overall health permits.   CYSTOSCOPY WITH RETROGRADE PYELOGRAM, URETEROSCOPY AND STENT PLACEMENT Left 12/30/2014   Procedure: CYSTOSCOPY WITH LEFT URETEROSCOPY STONE EXTRACTION WITH STENT;  Surgeon: Garnette Shack, MD;  Location: [facility];  Service: Urology;  Laterality: Left;   ELECTROPHYSIOLOGIC STUDY N/A 08/24/2015   Procedure: SVT Ablation;  Surgeon: Lynwood Rakers, MD;  Location: [facility] INVASIVE CV LAB;  Service: Cardiovascular;  Laterality:  N/A;   ENDOVENOUS ABLATION SAPHENOUS VEIN W/ LASER Right 12/04/2016   endovenous laser ablation right greater saphenous vein by Lynwood Collum MD    ESOPHAGOGASTRODUODENOSCOPY N/A 08/18/2016   Procedure: ESOPHAGOGASTRODUODENOSCOPY (EGD);  Surgeon: Lamar CHRISTELLA Hollingshead, MD;  Location: [facility];  Service: Endoscopy;  Laterality: N/A;   ESOPHAGOGASTRODUODENOSCOPY N/A 09/06/2016   Procedure: ESOPHAGOGASTRODUODENOSCOPY (EGD);  Surgeon: Lamar CHRISTELLA Hollingshead, MD;  Location: [facility];  Service: Endoscopy;  Laterality: N/A;  100 - moved to 2/14 @ 2:15 per [place]   ESOPHAGOGASTRODUODENOSCOPY (EGD) WITH PROPOFOL  N/A 08/26/2018   Mild Schatzki ring status post dilation, small hiatal hernia.   EYE SURGERY     Catracts removed both eye 30 yrs ago   HERNIA REPAIR     right and left   HOLMIUM LASER APPLICATION Left 12/30/2014   Procedure: HOLMIUM LASER APPLICATION;  Surgeon: Garnette Shack, MD;  Location: [facility];  Service: Urology;  Laterality: Left;   MALONEY DILATION N/A 09/06/2016   Procedure: AGAPITO DILATION;  Surgeon: Lamar CHRISTELLA Hollingshead, MD;  Location: [facility];  Service: Endoscopy;  Laterality: N/A;   MALONEY DILATION N/A 08/26/2018   Procedure: AGAPITO DILATION;  Surgeon: Hollingshead Lamar CHRISTELLA, MD;  Location: [facility];  Service: Endoscopy;  Laterality: N/A;   POLYPECTOMY  08/26/2018   Procedure: POLYPECTOMY;  Surgeon: Hollingshead Lamar CHRISTELLA, MD;  Location: [facility];  Service: Endoscopy;;  polyp at descending colon x2   TONSILLECTOMY      Current Outpatient Medications:    amLODipine  (NORVASC ) 10 MG tablet, Take 1 tablet (10 mg total) by mouth daily., Disp: 90 tablet, Rfl: 3   cetirizine (ZYRTEC) 10 MG tablet, Take 10 mg by mouth daily as needed for allergies., Disp: , Rfl:    Cyanocobalamin  (VITAMIN B-12 PO), Take 1 tablet by mouth daily. , Disp: , Rfl:    diphenhydrAMINE  HCl (BENADRYL  ALLERGY PO), Take 1  tablet by mouth daily., Disp: , Rfl:    donepezil  (ARICEPT ) 10 MG tablet, Take half tablet (5  mg) daily for 2 weeks, then increase to the full tablet at 10 mg daily, Disp: 90 tablet, Rfl: 3   dutasteride  (AVODART ) 0.5 MG capsule, Take 1 capsule (0.5 mg total) by mouth daily., Disp: 90 capsule, Rfl: 3   EPINEPHrine  0.3 mg/0.3 mL IJ SOAJ injection, Inject 0.3 mLs (0.3 mg total) into the muscle as needed for anaphylaxis., Disp: 1 each, Rfl: 1   fluticasone  (FLONASE ) 50 MCG/ACT nasal spray, INSTILL TWO SPRAYS IN EACH NOSTRIL DAILY AS NEEDED FOR ALLERGIES, Disp: 48 g, Rfl: 1   ketoconazole  (NIZORAL ) 2 % shampoo, Apply 1 Application topically 2 (two) times a week., Disp: 120 mL, Rfl: 0   lansoprazole  (PREVACID ) 30 MG capsule, TAKE ONE CAPSULE BY MOUTH DAILY BEFORE BREAKFAST., Disp: 90 capsule, Rfl: 3   loperamide (IMODIUM) 2 MG capsule, Take by mouth as needed for diarrhea or loose stools. Patient takes every 2-3 days., Disp: , Rfl:    memantine  (NAMENDA ) 10 MG tablet, Take 1 tablet (10 mg)  twice a day, Disp: 180 tablet, Rfl: 3   Simethicone  (GAS-X PO), Take 1 tablet by mouth daily as needed. , Disp: , Rfl:    tamsulosin  (FLOMAX ) 0.4 MG CAPS capsule, Take 2 capsules (0.8 mg total) by mouth daily., Disp: 90 capsule, Rfl: 1   triamcinolone  ointment (KENALOG ) 0.5 %, Apply 1 Application topically 2 (two) times daily., Disp: 45 g, Rfl: 1   vitamin C (ASCORBIC ACID) 500 MG tablet, Take 500 mg by mouth daily., Disp: , Rfl:      09/18/2023    1:00 PM 09/07/2022   12:00 PM 03/07/2022   12:00 PM 02/01/2021    5:45 PM 07/27/2017    1:43 PM  MMSE - Mini Mental State Exam  Not completed:     --  Orientation to time 4 5 2 5    Orientation to Place 5 5 5 5    Registration 3 3 3 3    Attention/ Calculation 4 4 4 4    Recall 0 2 2 1    Language- name 2 objects 2 2 2 2    Language- repeat 1 1 1 1    Language- follow 3 step command 3 3 3 3    Language- read & follow direction 1 1 1 1    Write a sentence 1 1 0 1   Copy design 1 1 1 1    Total score 25 28 24 27        03/02/2021    3:00 PM  Montreal Cognitive  Assessment   Visuospatial/ Executive (0/5) 4  Naming (0/3) 2  Attention: Read list of digits (0/2) 2  Attention: Read list of letters (0/1) 1  Attention: Serial 7 subtraction starting at 100 (0/3) 3  Language: Repeat phrase (0/2) 2  Language : Fluency (0/1) 1  Abstraction (0/2) 1  Delayed Recall (0/5) 0  Orientation (0/6) 4  Total 20  Adjusted Score (based on education) 20      10/03/2023    3:21 PM 09/22/2022   10:47 AM 09/20/2021   10:45 AM 08/25/2020    2:49 PM 08/15/2019    8:38 AM  6CIT Screen  What Year? 0 points 0 points 0 points 0 points 0 points  What month? 0 points 0 points 0 points 0 points 0 points  What time? 0 points 0 points 0 points  0 points  Count back from 20 0 points 0  points 0 points 0 points 0 points  Months in reverse 2 points 2 points 0 points 4 points 0 points  Repeat phrase 4 points 0 points 0 points 10 points 0 points  Total Score 6 points 2 points 0 points  0 points   Neuroimaging: Brain MRI on 12/02/2010 was unremarkable. Brain MRI on 01/25/2018 was unremarkable. Brain MRI on 02/16/2021 revealed mild microvascular ischemic changes, scattered microhemorrhages within the frontoparietal lobes said to reflect sequela of hypertensive microangiopathy, and mild generalized cerebral atrophy.   Clinical Interview:   The following information was obtained during a clinical interview with Mr. Munter and his wife prior to cognitive testing.  Cognitive Symptoms: Decreased short-term memory: Endorsed. Previously, Mr. Frampton reported mild difficulties recalling names and misplacing objects around his residence. His wife described more prominent difficulties, adding that their children have told him that he will repeat the same stories multiple times. Difficulties were said to be present for the past several years. Similar examples were provided currently. Both Mr. Bernard and his wife reported concern surrounding progressive memory decline relative to his previous April  2024 evaluation.  Decreased long-term memory: Denied. Decreased attention/concentration: Endorsed. While never formally diagnosed, he previously reported concerns surrounding longstanding traits of ADHD. He reported longstanding difficulties with sustained attention and distractibility throughout his life, including early academic settings. These difficulties have more or less persisted to present day; however, he has discovered compensatory strategies over the years which have helped him function over time.  Reduced processing speed: Denied. Difficulties with executive functions: Endorsed. He previously reported mild and longstanding difficulties with multi-tasking and organization. However, to combat this, he described himself as a purposefully systematic individual which helps improve day-to-day organization. They denied trouble with impulsivity or any significant personality changes.  Difficulties with emotion regulation: Denied. Difficulties with receptive language: Denied. Difficulties with word finding: Denied. His wife highlighted difficulty with spelling currently. He reported that this is a longstanding weakness.  Decreased visuoperceptual ability: Denied.   Difficulties completing ADLs: Largely denied. He remains independent with medication management. His wife manages finances and bill paying which is longstanding in nature. He continues to drive. His wife previously reported some trouble with inattention impacting driving performances (i.e., missing a turn or turning right when he was instructed to turn left). This has persisted to present day. No other safety concerns were noted.   Additional Medical History: History of traumatic brain injury/concussion: Endorsed. As a child, he reported sustaining a head impact causing a loss of consciousness of unknown duration. He reported waking while already receiving medical care. No persisting difficulties stemming from this event were reported. No  more recent head injuries were described.  History of stroke: Denied. History of seizure activity: Denied. History of known exposure to toxins: Denied. Symptoms of chronic pain: Denied. Experience of frequent headaches/migraines: Denied. Frequent instances of dizziness/vertigo: Denied.   Sensory changes: He has some floaters in his visual field but generally denied trouble with visual acuity. He utilizes hearing aids with benefit. Other sensory changes/difficulties (e.g., taste or smell) were denied. Balance/coordination difficulties: Largely denied. He described his balance as not quite as sound and did report being more cautious and more prone to hold onto things in his environment while ambulating. He denied any recent falls. He posited that his right side may exhibit slightly greater instability relative to his left.  Other motor difficulties: Denied.  Sleep History: Estimated hours obtained each night: 7-8 hours.  Difficulties falling asleep: Denied. Difficulties staying asleep: Denied outside  of waking several times to use the restroom throughout the night.  Feels rested and refreshed upon awakening: Endorsed.   History of snoring: Endorsed. Symptoms were said to be mild.  History of waking up gasping for air: Denied. Witnessed breath cessation while asleep: Denied.   History of vivid dreaming: Endorsed. Excessive movement while asleep: Denied. Instances of acting out his dreams: Denied.  Psychiatric/Behavioral Health History: Depression: He described his mood as good and denied to his knowledge any prior mental health concerns or diagnoses. His wife highlighted that his PCP had attempted to place him on a mood-related medication earlier this year for an unknown reason. Per her report, he exhibited hyperactivity immediately after taking this medication, causing her to discontinue its use. Current or remote suicidal ideation, intent, or plan was denied.  Anxiety: Denied. Mania:  Denied. Trauma History: Denied. Visual/auditory hallucinations: Denied. Delusional thoughts: Denied.   Tobacco: Denied. Alcohol: He reported very rare alcohol consumption and denied a history of problematic alcohol abuse or dependence.  Recreational drugs: Denied.  Family History: Problem Relation Age of Onset   Cancer Mother        lung   Thyroid  disease Mother    Diabetes Father    GI Bleed Father    Aneurysm Father    AAA (abdominal aortic aneurysm) Father    AAA (abdominal aortic aneurysm) Brother    Colon cancer Neg Hx    Colon polyps Neg Hx    Rectal cancer Neg Hx    Stomach cancer Neg Hx    This information was confirmed by Mr. Dakin.  Academic/Vocational History: Highest level of educational attainment: 16 years. He graduated from high school and earned a Oncologist in business administration. He emphasized that he always struggled throughout academic settings and described notable traits of both ADHD and dyslexia. Despite the difficulty, he reported being successful by often finding alternate ways to learn or process information and has relied on compensatory strategies over time.  History of developmental delay: Denied. History of grade repetition: Denied. Enrollment in special education courses: Denied.   Employment: Retired. 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   Evaluation Results:   Behavioral Observations: Mr. Romack was accompanied by his wife, arrived to his appointment on time, and was appropriately dressed and groomed. He appeared alert. Observed gait and station were within normal limits. Gross motor functioning appeared intact upon informal observation and no abnormal movements (e.g., tremors) were noted. His affect was generally relaxed and positive. Spontaneous speech was fluent and word finding difficulties were  not observed during the clinical interview. Thought processes were coherent, organized, and normal in content. He did not appear to recall his previous neuropsychological evaluation when this prior appointment was being discussed. Insight into his cognitive difficulties appeared somewhat limited. While he acknowledges ongoing memory impairment, I do have concern that he does not fully appreciate non-memory cognitive impairment, especially surrounding executive functioning.   During testing, sustained attention was appropriate. Task engagement was adequate and he persisted when challenged. Overall, Mr. Cervi was cooperative with the clinical interview and subsequent testing procedures.   Adequacy of Effort: The validity of neuropsychological testing is limited by the extent to which the individual being tested may be assumed to have exerted adequate effort during testing. Mr. Teed expressed his intention to perform to the best of his abilities and exhibited adequate task engagement  and persistence. Scores across stand-alone and embedded performance validity measures were variable but largely within expectation. His sole below expectation performance is believed to be due to true memory impairment rather than poor engagement or attempts to perform poorly. As such, the results of the current evaluation are believed to be a valid representation of Mr. Tindel's current cognitive functioning.  Test Results: Mr. Wheatley was poorly oriented at the time of the current evaluation. He was unable to recall his phone number. He was also unable to state the current date or name of the current clinic.  Intellectual abilities based upon educational and vocational attainment were estimated to be in the average range. Premorbid abilities were estimated to be within the average range based upon a single-word reading test.   Processing speed was variable, ranging from the well below average to average normative  ranges. Basic attention was below average to average. More complex attention (e.g., working memory) was variable, ranging from the well below average to average normative ranges. Executive functioning was exceptionally low to well below average outside of a strong performance across a task assessing abstract reasoning.  Assessed receptive language abilities were average. Likewise, Mr. Dieudonne did not exhibit any difficulties comprehending task instructions and answered all questions asked of him appropriately. Assessed expressive language was variable. Phonemic fluency was below average to average, semantic fluency was well below average to average, and confrontation naming was average to above average.     Assessed visuospatial/visuoconstructional abilities were average outside of an isolated impairment across a figure drawing task. Points were lost across his drawing of a complex figure due to a few mild distortions and one internal aspect being fully omitted.   Learning (i.e., encoding) of novel verbal information was exceptionally low. Spontaneous delayed recall (i.e., retrieval) of previously learned information was also exceptionally low. Retention rates were 0% across a list learning task, 0% across a story learning task, and 0% across a figure drawing task. Performance across recognition tasks was exceptionally low to well below average, suggesting negligible evidence for information consolidation.   Results of emotional screening instruments suggested that recent symptoms of generalized anxiety were in the minimal range, while symptoms of depression were within normal limits. A screening instrument assessing recent sleep quality suggested the presence of minimal sleep dysfunction.  Table of Scores:   Note: This summary of test scores accompanies the interpretive report and should not be considered in isolation without reference to the appropriate sections in the text. Descriptors are based on  appropriate normative data and may be adjusted based on clinical judgment. Terms such as Within Normal Limits and Outside Normal Limits are used when a more specific description of the test score cannot be determined. Descriptors refer to the current evaluation only.        Percentile - Normative Descriptor > 98 - Exceptionally High 91-97 - Well Above Average 75-90 - Above Average 25-74 - Average 9-24 - Below Average 2-8 - Well Below Average < 2 - Exceptionally Low        Validity: April 2024 Current  DESCRIPTOR        DCT: --- --- --- Within Normal Limits  RBANS EI: --- --- --- Outside Normal Limits  WAIS-IV RDS: --- --- --- Within Normal Limits        Orientation:       Raw Score Raw Score Percentile   NAB Orientation, Form 1 26/29 22/29 --- ---        Cognitive Screening:  Raw Score Raw Score Percentile   SLUMS: 9/30 20/30 --- ---        RBANS, Form A: Standard Score/ Scaled Score Standard Score/ Scaled Score Percentile   Total Score 68 59 <1 Exceptionally Low  Immediate Memory 65 53 <1 Exceptionally Low    List Learning 5 2 <1 Exceptionally Low    Story Memory 3 3 1  Exceptionally Low  Visuospatial/Constructional 92 87 19 Below Average    Figure Copy 10 4 2  Well Below Average    Line Orientation 15/20 18/20 51-75 Average  Language 85 85 16 Below Average    Picture Naming 10/10 10/10 51-75 Average    Semantic Fluency 4 4 2  Well Below Average  Attention 85 72 3 Well Below Average    Digit Span 11 7 16  Below Average    Coding 4 4 2  Well Below Average  Delayed Memory 48 40 <1 Exceptionally Low    List Recall 0/10 0/10 <2 Exceptionally Low    List Recognition 14/20 12/20 <2 Exceptionally Low    Story Recall 1 1 <1 Exceptionally Low    Story Recognition 4/12 7/12 5-7 Well Below Average    Figure Recall 4 1 <1 Exceptionally Low    Figure Recognition 3/8 2/8 2-3 Well Below Average        Intellectual Functioning:       Standard Score Standard Score Percentile    Test of Premorbid Functioning: 104 99 47 Average        Attention/Executive Function:      Trail Making Test (TMT): Raw Score (T Score) Raw Score (T Score) Percentile     Part A 103 secs.,  0 errors (<19) 51 secs.,  0 errors (40) 16 Below Average    Part B Discontinued 218 secs.,  1 error (31) 3 Well Below Average          Scaled Score Scaled Score Percentile   WAIS-IV Digit Span: 8 6 9  Below Average    Forward 8 8 25  Average    Backward 9 8 25  Average    Sequencing 8 4 2  Well Below Average         Scaled Score Scaled Score Percentile   WAIS-IV Similarities: 11 12 75 Above Average        D-KEFS Color-Word Interference Test: Raw Score (Scaled Score) Raw Score (Scaled Score) Percentile     Color Naming 45 secs. (5) 45 secs. (5) 5 Well Below Average    Word Reading 25 secs. (10) 24 secs. (11) 63 Average    Inhibition 131 secs. (1) Discontinued --- Impaired      Total Errors 6 errors (7) --- --- ---    Inhibition/Switching 128 secs. (3) Not attempted --- ---      Total Errors 2 errors (11) --- --- ---        D-KEFS Verbal Fluency Test: Raw Score (Scaled Score) Raw Score (Scaled Score) Percentile     Letter Total Correct 30 (9) 30 (9) 37 Average    Category Total Correct 21 (5) 27 (8) 25 Average    Category Switching Total Correct 3 (1) 5 (2) <1 Exceptionally Low    Category Switching Accuracy 1 (1) 4 (3) 1 Exceptionally Low      Total Set Loss Errors 3 (9) 4 (8) 25 Average      Total Repetition Errors 5 (8) 4 (9) 37 Average        Language:      Verbal Fluency Test:  Raw Score (T Score) Raw Score (T Score) Percentile     Phonemic Fluency (FAS) 30 (42) 30 (42) 21 Below Average    Animal Fluency 9 (27) 14 (40) 16 Below Average         NAB Language Module, Form 1: T Score T Score Percentile     Auditory Comprehension 53 56 73 Average    Naming 30/31 (58) 31/31 (59) 82 Above Average        Visuospatial/Visuoconstruction:       Raw Score Raw Score Percentile   Clock  Drawing: 6/10 8/10 --- Within Normal Limits         Scaled Score Scaled Score Percentile   WAIS-IV Block Design: 12 10 50 Average        Mood and Personality:       Raw Score Raw Score Percentile   Geriatric Depression Scale: --- 3 --- Within Normal Limits  Geriatric Anxiety Scale: --- 3 --- Minimal    Somatic --- 0 --- Minimal    Cognitive --- 2 --- Minimal    Affective --- 1 --- Minimal        Additional Questionnaires:       Raw Score Raw Score Percentile   PROMIS Sleep Disturbance Questionnaire: 10 9 --- None to Slight   Informed Consent and Coding/Compliance:   The current evaluation represents a clinical evaluation for the purposes previously outlined by the referral source and is in no way reflective of a forensic evaluation.   Mr. Mcquerry was provided with a verbal description of the nature and purpose of the present neuropsychological evaluation. Also reviewed were the foreseeable risks and/or discomforts and benefits of the procedure, limits of confidentiality, and mandatory reporting requirements of this provider. The patient was given the opportunity to ask questions and receive answers about the evaluation. Oral consent to participate was provided by the patient.   This evaluation was conducted by Arthea KYM Maryland, Ph.D., ABPP-CN, board certified clinical neuropsychologist. Mr. Schloemer completed a clinical interview with Dr. Maryland, billed as one unit [phone], and 125 minutes of cognitive testing and scoring, billed as one unit [phone] and three additional units [id number]. Psychometrist Lonell Jude, B.S. assisted Dr. Maryland with test administration and scoring procedures. As a separate and discrete service, one unit [phone] and two units 96133 (164 minutes) were billed for Dr. Loralee time spent in interpretation and report writing.

## 2024-05-09 DIAGNOSIS — Z23 Encounter for immunization: Secondary | ICD-10-CM | POA: Diagnosis not present

## 2024-05-13 ENCOUNTER — Ambulatory Visit (INDEPENDENT_AMBULATORY_CARE_PROVIDER_SITE_OTHER): Payer: Medicare Other | Admitting: Psychology

## 2024-05-13 DIAGNOSIS — G309 Alzheimer's disease, unspecified: Secondary | ICD-10-CM | POA: Diagnosis not present

## 2024-05-13 DIAGNOSIS — F0670 Mild neurocognitive disorder due to known physiological condition without behavioral disturbance: Secondary | ICD-10-CM | POA: Diagnosis not present

## 2024-05-13 NOTE — Progress Notes (Signed)
   Neuropsychology Feedback Session [name] [facility] Department of Neurology  Reason for Referral:   Daniel Guerrero is a 78 y.o. 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   Feedback:   Daniel Guerrero completed a comprehensive neuropsychological evaluation on 05/06/2024. Please refer to that encounter for the full report and recommendations. Briefly, results suggested significant impairment surrounding all aspects of learning and memory. Performance variability was exhibited across processing speed, complex attention, and executive functioning. An isolated impairment was also exhibited across a figure drawing task; however, other visuospatial tasks were appropriate. Performances were also appropriate relative to age-matched peers across basic attention, receptive language, and expressive language. Functionally, Daniel Guerrero largely denied difficulties completing instrumental activities of daily living (ADLs) independently. His wife was in agreement. As such, given evidence for cognitive dysfunction described above, he continues to best meet diagnostic criteria for a Mild Neurocognitive Disorder (mild cognitive impairment) at the present time. Regarding the underlying etiology for ongoing memory impairment, concerns surrounding an underlying neurodegenerative illness such as Alzheimer's disease remain. Across memory testing, Daniel Guerrero did not benefit from repeated learning opportunities, was fully amnestic (i.e., 0% retention) across all memory tasks after a brief delay, and performed poorly across follow-up recognition testing. Taken together, this continues to suggest rapid forgetting and a pronounced storage impairment,  both of which are hallmark characteristics of this illness.  Daniel Guerrero was accompanied by his wife and son during the current feedback session. Content of the current session focused on the results of his neuropsychological evaluation. Daniel Guerrero was given the opportunity to ask questions and his questions were answered. He was encouraged to reach out should additional questions arise. A copy of his report was provided at the conclusion of the visit.      One unit 96132 (33 minutes) was billed for Dr. Loralee time spent preparing for, conducting, and documenting the current feedback session with Daniel Guerrero.

## 2024-05-30 ENCOUNTER — Other Ambulatory Visit: Payer: Self-pay | Admitting: Adult Health

## 2024-05-30 DIAGNOSIS — N401 Enlarged prostate with lower urinary tract symptoms: Secondary | ICD-10-CM

## 2024-05-30 NOTE — Telephone Encounter (Signed)
 Copied from CRM [id number]. Topic: Clinical - Medication Refill >> May 30, 2024  2:35 PM Alexandria E wrote: Medication: tamsulosin  (FLOMAX ) 0.4 MG CAPS capsule  Has the patient contacted their pharmacy? Yes (Agent: If no, request that the patient contact the pharmacy for the refill. If patient does not wish to contact the pharmacy document the reason why and proceed with request.) (Agent: If yes, when and what did the pharmacy advise?)  This is the patient's preferred pharmacy:  [facility] - [place], [place] - [address] [place] [address] Phone: [phone] Fax: [phone]   Is this the correct pharmacy for this prescription? Yes If no, delete pharmacy and type the correct one.   Has the prescription been filled recently? No  Is the patient out of the medication? No, couple days.  Has the patient been seen for an appointment in the last year OR does the patient have an upcoming appointment? Yes  Can we respond through MyChart? Yes  Agent: Please be advised that Rx refills may take up to 3 business days. We ask that you follow-up with your pharmacy.

## 2024-05-31 ENCOUNTER — Other Ambulatory Visit: Payer: Self-pay | Admitting: Adult Health

## 2024-05-31 DIAGNOSIS — N401 Enlarged prostate with lower urinary tract symptoms: Secondary | ICD-10-CM

## 2024-06-03 MED ORDER — TAMSULOSIN HCL 0.4 MG PO CAPS
0.8000 mg | ORAL_CAPSULE | Freq: Every day | ORAL | 1 refills | Status: AC
Start: 2024-06-03 — End: ?

## 2024-06-04 ENCOUNTER — Ambulatory Visit: Payer: Self-pay | Admitting: Adult Health

## 2024-06-04 ENCOUNTER — Ambulatory Visit (INDEPENDENT_AMBULATORY_CARE_PROVIDER_SITE_OTHER): Admitting: Adult Health

## 2024-06-04 ENCOUNTER — Encounter: Payer: Self-pay | Admitting: Adult Health

## 2024-06-04 ENCOUNTER — Ambulatory Visit (INDEPENDENT_AMBULATORY_CARE_PROVIDER_SITE_OTHER)

## 2024-06-04 VITALS — BP 120/60 | HR 60 | Temp 98.8°F | Ht 70.0 in | Wt 176.0 lb

## 2024-06-04 DIAGNOSIS — M19071 Primary osteoarthritis, right ankle and foot: Secondary | ICD-10-CM | POA: Diagnosis not present

## 2024-06-04 DIAGNOSIS — M21961 Unspecified acquired deformity of right lower leg: Secondary | ICD-10-CM

## 2024-06-04 DIAGNOSIS — N401 Enlarged prostate with lower urinary tract symptoms: Secondary | ICD-10-CM

## 2024-06-04 DIAGNOSIS — M7731 Calcaneal spur, right foot: Secondary | ICD-10-CM | POA: Diagnosis not present

## 2024-06-04 NOTE — Telephone Encounter (Signed)
 FYI Only or Action Required?: FYI only for provider: appointment scheduled on 11.12.25.  Patient was last seen in primary care on 03/18/2024 by Merna Huxley, NP.  Called Nurse Triage reporting [place].  Symptoms began today.  Interventions attempted: Nothing.  Symptoms are: stable.  Triage Disposition: See PCP Within 2 Weeks  Patient/caregiver understands and will follow disposition?: Yes    Pt also requesting refill for tamsulosin 

## 2024-06-04 NOTE — Progress Notes (Signed)
 Subjective:    Patient ID: Daniel Guerrero, male    DOB: 07-02-1946, 78 y.o.   MRN: [id number]  HPI Discussed the use of AI scribe software for clinical note transcription with the patient, who gave verbal consent to proceed.  History of Present Illness   Daniel Guerrero is a 78 year old male who presents with a bony protrusion on the right foot.  A bony protrusion is present on the medial aspect of his right foot, noticed a couple of days ago but unsure of how long it has bene present.  It is not painful with ambulation or touch. No redness or warmth.        Review of Systems See HPI   Past Medical History:  Diagnosis Date   Allergic rhinitis 08/01/2010   Asthma with acute exacerbation 11/09/2014   Asymmetrical sensorineural hearing loss 02/06/2018   Left Ear   Benign paroxysmal positional vertigo 04/16/2014   BPH (benign prostatic hyperplasia)    Cataract of both eyes    implants both eyes    Contact dermatitis 12/13/2007   Dysphagia 08/04/2010   Eczema    Enlarged aorta    Erectile dysfunction    GERD (gastroesophageal reflux disease) 11/26/2018   Headache 12/19/2016   Headache, common migraine, intractable 09/09/2010   History of colonic polyps 11/26/2018   History of inguinal hernia repair, bilateral    Hyperlipidemia 04/14/2016   Hypertension    Kidney stones    Meniere disease    Mild neurocognitive disorder with concerns for underlying Alzheimer's disease 11/17/2022   Paroxysmal SVT (supraventricular tachycardia)    Psoriasis 12/13/2007   S/P appendectomy    S/P tonsillectomy    Sinus bradycardia 06/10/2015   Varicose veins of right lower extremity with complications 10/30/2016    Social History   Socioeconomic History   Marital status: Married    Spouse name: Not on file   Number of children: 3   Years of education: 16   Highest education level: Bachelor's degree (e.g., BA, AB, BS)  Occupational History   Occupation: Retired    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  Tobacco Use   Smoking status: Former    Current packs/day: 0.00    Types: Cigarettes    Quit date: 07/24/1964    Years since quitting: 59.9   Smokeless tobacco: Never   Tobacco comments:    Quit 20-30 years ago; smoked pipe and cigars   Vaping Use   Vaping status: Never Used  Substance and Sexual Activity   Alcohol use: Not Currently   Drug use: No   Sexual activity: Not on file  Other Topics Concern   Not on file  Social History Narrative   Daily Caffeine use: 5-6 drinks daily      08/07/2018: No drug use, some daily alcohol use at night but patient does not feel like it disrupts his health or is a safety concern.       Lives with wife in two story home; three sons, five grandchildren in [place] area.   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    Right handed            Social Drivers of Health   Financial Resource Strain: Low Risk  (10/03/2023)   Overall Financial Resource Strain (CARDIA)    Difficulty of Paying Living Expenses: Not hard at all  Food Insecurity: No Food Insecurity (10/03/2023)   Hunger Vital Sign  Worried About Programme Researcher, Broadcasting/film/video in the Last Year: Never true    Ran Out of Food in the Last Year: Never true  Transportation Needs: No Transportation Needs (10/03/2023)   PRAPARE - Administrator, Civil Service (Medical): No    Lack of Transportation (Non-Medical): No  Physical Activity: Insufficiently Active (10/03/2023)   Exercise Vital Sign    Days of Exercise per Week: 2 days    Minutes of Exercise per Session: 30 min  Stress: No Stress Concern Present (10/03/2023)   [name]    Feeling of Stress : Not at all  Social Connections: Socially Integrated (10/03/2023)   Social Connection and Isolation Panel    Frequency of Communication with Friends and Family: More than three times a week    Frequency  of Social Gatherings with Friends and Family: More than three times a week    Attends Religious Services: More than 4 times per year    Active Member of [employer] or Organizations: Yes    Attends Engineer, Structural: More than 4 times per year    Marital Status: Married  Catering Manager Violence: Not At Risk (10/03/2023)   Humiliation, Afraid, Rape, and Kick questionnaire    Fear of Current or Ex-Partner: No    Emotionally Abused: No    Physically Abused: No    Sexually Abused: No    Past Surgical History:  Procedure Laterality Date   Ablasion     APPENDECTOMY     CARDIAC ELECTROPHYSIOLOGY MAPPING AND ABLATION  2015   CHOLECYSTECTOMY     COLONOSCOPY     unsure where or when- states greater than 10 yrs ago per pt- was normal per pt and his wife    COLONOSCOPY WITH PROPOFOL  N/A 08/26/2018   two tubular adenomas,  diverticulosis.  Repeat colonoscopy in February 2025 if overall health permits.   CYSTOSCOPY WITH RETROGRADE PYELOGRAM, URETEROSCOPY AND STENT PLACEMENT Left 12/30/2014   Procedure: CYSTOSCOPY WITH LEFT URETEROSCOPY STONE EXTRACTION WITH STENT;  Surgeon: Garnette Shack, MD;  Location: [facility];  Service: Urology;  Laterality: Left;   ELECTROPHYSIOLOGIC STUDY N/A 08/24/2015   Procedure: SVT Ablation;  Surgeon: Lynwood Rakers, MD;  Location: [facility];  Service: Cardiovascular;  Laterality: N/A;   ENDOVENOUS ABLATION SAPHENOUS VEIN W/ LASER Right 12/04/2016   endovenous laser ablation right greater saphenous vein by Lynwood Collum MD    ESOPHAGOGASTRODUODENOSCOPY N/A 08/18/2016   Procedure: ESOPHAGOGASTRODUODENOSCOPY (EGD);  Surgeon: Lamar CHRISTELLA Hollingshead, MD;  Location: [facility];  Service: Endoscopy;  Laterality: N/A;   ESOPHAGOGASTRODUODENOSCOPY N/A 09/06/2016   Procedure: ESOPHAGOGASTRODUODENOSCOPY (EGD);  Surgeon: Lamar CHRISTELLA Hollingshead, MD;  Location: [facility];  Service: Endoscopy;  Laterality: N/A;  100 - moved to 2/14 @ 2:15 per [place]   ESOPHAGOGASTRODUODENOSCOPY  (EGD) WITH PROPOFOL  N/A 08/26/2018   Mild Schatzki ring status post dilation, small hiatal hernia.   EYE SURGERY     Catracts removed both eye 30 yrs ago   HERNIA REPAIR     right and left   HOLMIUM LASER APPLICATION Left 12/30/2014   Procedure: HOLMIUM LASER APPLICATION;  Surgeon: Garnette Shack, MD;  Location: [facility];  Service: Urology;  Laterality: Left;   MALONEY DILATION N/A 09/06/2016   Procedure: AGAPITO DILATION;  Surgeon: Lamar CHRISTELLA Hollingshead, MD;  Location: [facility];  Service: Endoscopy;  Laterality: N/A;   MALONEY DILATION N/A 08/26/2018   Procedure: AGAPITO DILATION;  Surgeon: Hollingshead Lamar  M, MD;  Location: [facility];  Service: Endoscopy;  Laterality: N/A;   POLYPECTOMY  08/26/2018   Procedure: POLYPECTOMY;  Surgeon: Shaaron Lamar HERO, MD;  Location: [facility];  Service: Endoscopy;;  polyp at descending colon x2   TONSILLECTOMY      Family History  Problem Relation Age of Onset   Cancer Mother        lung   Thyroid  disease Mother    Diabetes Father    GI Bleed Father    Aneurysm Father    AAA (abdominal aortic aneurysm) Father    AAA (abdominal aortic aneurysm) Brother    Colon cancer Neg Hx    Colon polyps Neg Hx    Rectal cancer Neg Hx    Stomach cancer Neg Hx     Allergies  Allergen Reactions   Peanut-Containing Drug Products Hives   Penicillins Hives    Has patient had a PCN reaction causing immediate rash, facial/tongue/throat swelling, SOB or lightheadedness with hypotension: No Has patient had a PCN reaction causing severe rash involving mucus membranes or skin necrosis: No Has patient had a PCN reaction that required hospitalization No Has patient had a PCN reaction occurring within the last 10 years: No If all of the above answers are NO, then may proceed with Cephalosporin use.   Quinolones     Patient was warned about not using Cipro  and similar antibiotics. Recent studies have raised concern that fluoroquinolone antibiotics could be  associated with an increased risk of aortic aneurysm Fluoroquinolones have non-antimicrobial properties that might jeopardise the integrity of the extracellular matrix of the vascular wall In a  propensity score matched cohort study in [place], there was a 66% increased rate of aortic aneurysm or dissection associated with oral fluoroquinolone use, compared wit    Current Outpatient Medications on File Prior to Visit  Medication Sig Dispense Refill   amLODipine  (NORVASC ) 10 MG tablet Take 1 tablet (10 mg total) by mouth daily. 90 tablet 3   cetirizine (ZYRTEC) 10 MG tablet Take 10 mg by mouth daily as needed for allergies.     Cyanocobalamin  (VITAMIN B-12 PO) Take 1 tablet by mouth daily.      diphenhydrAMINE  HCl (BENADRYL  ALLERGY PO) Take 1 tablet by mouth daily.     donepezil  (ARICEPT ) 10 MG tablet Take half tablet (5 mg) daily for 2 weeks, then increase to the full tablet at 10 mg daily 90 tablet 3   dutasteride  (AVODART ) 0.5 MG capsule Take 1 capsule (0.5 mg total) by mouth daily. 90 capsule 3   EPINEPHrine  0.3 mg/0.3 mL IJ SOAJ injection Inject 0.3 mLs (0.3 mg total) into the muscle as needed for anaphylaxis. 1 each 1   fluticasone  (FLONASE ) 50 MCG/ACT nasal spray INSTILL TWO SPRAYS IN EACH NOSTRIL DAILY AS NEEDED FOR ALLERGIES 48 g 1   ketoconazole  (NIZORAL ) 2 % shampoo Apply 1 Application topically 2 (two) times a week. 120 mL 0   lansoprazole  (PREVACID ) 30 MG capsule TAKE ONE CAPSULE BY MOUTH DAILY BEFORE BREAKFAST. 90 capsule 3   loperamide (IMODIUM) 2 MG capsule Take by mouth as needed for diarrhea or loose stools. Patient takes every 2-3 days.     memantine  (NAMENDA ) 10 MG tablet Take 1 tablet (10 mg)  twice a day 180 tablet 3   Simethicone  (GAS-X PO) Take 1 tablet by mouth daily as needed.      tamsulosin  (FLOMAX ) 0.4 MG CAPS capsule Take 2 capsules (0.8 mg total) by mouth daily. 90  capsule 1   tamsulosin  (FLOMAX ) 0.4 MG CAPS capsule TAKE TWO CAPSULES BY MOUTH DAILY 90 capsule 1    triamcinolone  ointment (KENALOG ) 0.5 % Apply 1 Application topically 2 (two) times daily. 45 g 1   vitamin C (ASCORBIC ACID) 500 MG tablet Take 500 mg by mouth daily.     No current facility-administered medications on file prior to visit.    BP 120/60   Pulse 60   Temp 98.8 F (37.1 C) (Oral)   Ht 5' 10 (1.778 m)   Wt 176 lb (79.8 kg)   SpO2 93%   BMI 25.25 kg/m       Objective:   Physical Exam Vitals and nursing note reviewed.  Constitutional:      Appearance: Normal appearance.  Musculoskeletal:        General: Normal range of motion.       Feet:  Skin:    General: Skin is warm and dry.  Neurological:     General: No focal deficit present.     Mental Status: He is alert and oriented to person, place, and time.  Psychiatric:        Mood and Affect: Mood normal.        Behavior: Behavior normal.        Thought Content: Thought content normal.        Judgment: Judgment normal.        Assessment & Plan:   Assessment and Plan    Acquired deformity of right foot Bony growth on medial arch area of right foot, likely calcification, asymptomatic. - Ordered x-ray of right foot to evaluate growth. - Can refer to podiatry as needed       Darleene Shape, NP

## 2024-06-04 NOTE — Telephone Encounter (Signed)
 Reason for Triage: Patient has something growing on the bottom of his foot has been there for a few day-not painful  or swollen but uncomfortable  please call [phone] which is Mrs. Mccaffrey number    Reason for Disposition  Caller is uncertain what lesion is  Answer Assessment - Initial Assessment Questions Rn returned call to patients wife, patient also on the line. He states his wife was poking around on his feet and found this growth. He thinks its more of bone extension. He states its about 1/2 inch protrusion between his big toe and next toe. He denies any problems. Denies any pain, swelling.    1. APPEARANCE of LESION: What does it look like?      Protrusion between big toe and next toe 2. SIZE: How big is it? (e.g., inches, cm; or compare to size of pinhead, tip of pen, eraser, coin, pea, grape, ping pong ball)      1/2 inch 3. COLOR: What color is it? Is there more than one color?     Skin color 4. SHAPE: What shape is it? (e.g., round, irregular)     Bump  6. TENDER: Does it hurt when you touch it?  (Scale 1-10; or mild, moderate, severe)     no 7. LOCATION: Where is it located?      Between big toe and next toe 8. ONSET: When did it first appear?      Noticed it last night  Protocols used: Skin Lesion - Moles or Growths-A-AH

## 2024-06-06 ENCOUNTER — Ambulatory Visit: Payer: Self-pay | Admitting: Adult Health

## 2024-06-11 ENCOUNTER — Ambulatory Visit: Admitting: Dermatology

## 2024-07-03 ENCOUNTER — Encounter: Payer: Self-pay | Admitting: Gastroenterology

## 2024-08-20 ENCOUNTER — Ambulatory Visit: Admitting: Physician Assistant

## 2024-09-18 ENCOUNTER — Ambulatory Visit: Admitting: Physician Assistant

## 2024-10-08 ENCOUNTER — Ambulatory Visit
